# Patient Record
Sex: Male | Born: 1935 | Race: White | Hispanic: No | Marital: Married | Smoking: Never smoker
Health system: Southern US, Community
[De-identification: ages and names within clinical notes are randomized; demographics above are authoritative.]

## PROBLEM LIST (undated history)

## (undated) DIAGNOSIS — K9189 Other postprocedural complications and disorders of digestive system: Secondary | ICD-10-CM

## (undated) DIAGNOSIS — K2270 Barrett's esophagus without dysplasia: Secondary | ICD-10-CM

## (undated) DIAGNOSIS — Z8639 Personal history of other endocrine, nutritional and metabolic disease: Secondary | ICD-10-CM

## (undated) DIAGNOSIS — K567 Ileus, unspecified: Secondary | ICD-10-CM

## (undated) DIAGNOSIS — N189 Chronic kidney disease, unspecified: Secondary | ICD-10-CM

## (undated) DIAGNOSIS — K5790 Diverticulosis of intestine, part unspecified, without perforation or abscess without bleeding: Secondary | ICD-10-CM

## (undated) DIAGNOSIS — E039 Hypothyroidism, unspecified: Secondary | ICD-10-CM

## (undated) DIAGNOSIS — Z8719 Personal history of other diseases of the digestive system: Secondary | ICD-10-CM

## (undated) DIAGNOSIS — K2980 Duodenitis without bleeding: Secondary | ICD-10-CM

## (undated) DIAGNOSIS — I10 Essential (primary) hypertension: Secondary | ICD-10-CM

## (undated) DIAGNOSIS — K449 Diaphragmatic hernia without obstruction or gangrene: Secondary | ICD-10-CM

## (undated) DIAGNOSIS — Z9289 Personal history of other medical treatment: Secondary | ICD-10-CM

## (undated) DIAGNOSIS — I517 Cardiomegaly: Secondary | ICD-10-CM

## (undated) DIAGNOSIS — E291 Testicular hypofunction: Secondary | ICD-10-CM

## (undated) DIAGNOSIS — C259 Malignant neoplasm of pancreas, unspecified: Secondary | ICD-10-CM

## (undated) DIAGNOSIS — Z87442 Personal history of urinary calculi: Secondary | ICD-10-CM

## (undated) DIAGNOSIS — K4020 Bilateral inguinal hernia, without obstruction or gangrene, not specified as recurrent: Secondary | ICD-10-CM

## (undated) DIAGNOSIS — E119 Type 2 diabetes mellitus without complications: Secondary | ICD-10-CM

## (undated) DIAGNOSIS — C4490 Unspecified malignant neoplasm of skin, unspecified: Secondary | ICD-10-CM

## (undated) DIAGNOSIS — K439 Ventral hernia without obstruction or gangrene: Secondary | ICD-10-CM

## (undated) DIAGNOSIS — K429 Umbilical hernia without obstruction or gangrene: Secondary | ICD-10-CM

## (undated) DIAGNOSIS — E785 Hyperlipidemia, unspecified: Secondary | ICD-10-CM

## (undated) DIAGNOSIS — R55 Syncope and collapse: Secondary | ICD-10-CM

## (undated) DIAGNOSIS — K219 Gastro-esophageal reflux disease without esophagitis: Secondary | ICD-10-CM

## (undated) DIAGNOSIS — K8020 Calculus of gallbladder without cholecystitis without obstruction: Secondary | ICD-10-CM

## (undated) HISTORY — DX: Diaphragmatic hernia without obstruction or gangrene: K44.9

## (undated) HISTORY — PX: VASECTOMY: SHX75

## (undated) HISTORY — DX: Type 2 diabetes mellitus without complications: E11.9

## (undated) HISTORY — DX: Duodenitis without bleeding: K29.80

## (undated) HISTORY — PX: COLONOSCOPY: SHX174

## (undated) HISTORY — DX: Diverticulosis of intestine, part unspecified, without perforation or abscess without bleeding: K57.90

## (undated) HISTORY — PX: ESOPHAGOGASTRODUODENOSCOPY: SHX1529

## (undated) HISTORY — DX: Barrett's esophagus without dysplasia: K22.70

## (undated) HISTORY — DX: Gastro-esophageal reflux disease without esophagitis: K21.9

---

## 1999-03-19 ENCOUNTER — Encounter (INDEPENDENT_AMBULATORY_CARE_PROVIDER_SITE_OTHER): Payer: Self-pay

## 1999-03-19 ENCOUNTER — Other Ambulatory Visit: Admission: RE | Admit: 1999-03-19 | Discharge: 1999-03-19 | Payer: Self-pay | Admitting: Gastroenterology

## 2001-02-20 ENCOUNTER — Ambulatory Visit (HOSPITAL_COMMUNITY): Admission: RE | Admit: 2001-02-20 | Discharge: 2001-02-20 | Payer: Self-pay | Admitting: Internal Medicine

## 2001-02-20 ENCOUNTER — Encounter: Payer: Self-pay | Admitting: Internal Medicine

## 2004-01-09 ENCOUNTER — Ambulatory Visit: Payer: Self-pay | Admitting: Pulmonary Disease

## 2004-07-18 ENCOUNTER — Ambulatory Visit: Payer: Self-pay | Admitting: Pulmonary Disease

## 2004-09-26 ENCOUNTER — Ambulatory Visit: Payer: Self-pay | Admitting: Pulmonary Disease

## 2005-11-12 ENCOUNTER — Ambulatory Visit: Payer: Self-pay | Admitting: Pulmonary Disease

## 2005-11-26 ENCOUNTER — Encounter: Admission: RE | Admit: 2005-11-26 | Discharge: 2005-11-26 | Payer: Self-pay | Admitting: Pulmonary Disease

## 2005-12-25 ENCOUNTER — Ambulatory Visit: Payer: Self-pay | Admitting: Pulmonary Disease

## 2006-01-15 ENCOUNTER — Ambulatory Visit: Payer: Self-pay | Admitting: Gastroenterology

## 2006-02-03 ENCOUNTER — Encounter (INDEPENDENT_AMBULATORY_CARE_PROVIDER_SITE_OTHER): Payer: Self-pay | Admitting: *Deleted

## 2006-02-03 ENCOUNTER — Ambulatory Visit: Payer: Self-pay | Admitting: Gastroenterology

## 2007-04-07 ENCOUNTER — Ambulatory Visit: Payer: Self-pay | Admitting: Gastroenterology

## 2008-12-12 ENCOUNTER — Encounter (INDEPENDENT_AMBULATORY_CARE_PROVIDER_SITE_OTHER): Payer: Self-pay | Admitting: *Deleted

## 2009-07-11 ENCOUNTER — Telehealth: Payer: Self-pay | Admitting: Gastroenterology

## 2009-07-12 ENCOUNTER — Encounter (INDEPENDENT_AMBULATORY_CARE_PROVIDER_SITE_OTHER): Payer: Self-pay | Admitting: *Deleted

## 2009-07-31 ENCOUNTER — Encounter (INDEPENDENT_AMBULATORY_CARE_PROVIDER_SITE_OTHER): Payer: Self-pay | Admitting: *Deleted

## 2009-08-02 ENCOUNTER — Ambulatory Visit: Payer: Self-pay | Admitting: Gastroenterology

## 2009-08-16 ENCOUNTER — Ambulatory Visit: Payer: Self-pay | Admitting: Gastroenterology

## 2009-08-18 ENCOUNTER — Encounter: Payer: Self-pay | Admitting: Gastroenterology

## 2010-04-12 NOTE — Letter (Signed)
Summary: Patient Notice-[facility]  [address] [place], [place] [address]   Phone: [phone]  Fax: [phone]        August 18, 2009 MRN: [id number]    [facility] [address] [place], [place]  [address]    Dear Mr. Guderian,  I am pleased to inform you that the biopsies taken during your recent endoscopic examination did not show any evidence of cancer upon pathologic examination.  However, your biopsies indicate you have a condition known as Barrett's esophagus. While not cancer, it is pre-cancerous (can progress to cancer) and needs to be monitored with repeat endoscopic examination and biopsies.  Fortunately, it is quite rare that this develops into cancer, but careful monitoring of the condition along with taking your medication as prescribed is important in reducing the risk of developing cancer.  It is my recommendation that you have a repeat upper gastrointestinal endoscopic examination in 3_ years.  Additional information/recommendations:  __Please call [phone] to schedule a return visit to further      evaluate your condition.  _xx_Continue with treatment plan as outlined the day of your exam.  Please call us if you have or develop heartburn, reflux symptoms, any swallowing problems, or if you have questions about your condition that have not been fully answered at this time.  Sincerely,  Mardella Layman MD [facility]  This letter has been electronically signed by your physician.  Appended Document: Patient Notice-Barrett's Esopghagus letter mailed.

## 2010-04-12 NOTE — Procedures (Signed)
Summary: Colonoscopy  Patient: [name] Note: All result statuses are Final unless otherwise noted.  Tests: (1) Colonoscopy (COL)   COL Colonoscopy           DONE     [facility]     [address]     [place], [place]  [address]           COLONOSCOPY PROCEDURE REPORT           PATIENT:  Zachary Cook, Zachary Cook  MR#:  [id number]     BIRTHDATE:  [birth year], 77 yrs. old  GENDER:  male     ENDOSCOPIST:  Vania Rea. Jarold Motto, MD, [facility]     REF. BY:  Vania Rea. Jarold Motto, M.D.     PROCEDURE DATE:  08/16/2009     PROCEDURE:  Surveillance Colonoscopy     ASA CLASS:  Class II     INDICATIONS:  history of pre-cancerous (adenomatous) colon polyps           MEDICATIONS:   Fentanyl 50 mcg IV, Versed 6 mg IV           DESCRIPTION OF PROCEDURE:   After the risks benefits and     alternatives of the procedure were thoroughly explained, informed     consent was obtained.  Digital rectal exam was performed and     revealed no abnormalities.   The LB CF-H180AL [id number] endoscope     was introduced through the anus and advanced to the cecum, which     was identified by both the appendix and ileocecal valve, without     limitations.  The quality of the prep was excellent, using     MoviPrep.  The instrument was then slowly withdrawn as the colon     was fully examined.     <<PROCEDUREIMAGES>>           FINDINGS:  Severe diverticulosis was found in the sigmoid to     descending colon segments. red thickened haustral folds.  Moderate     diverticulosis was found in the right colon. large mouthed tics.     No polyps or cancers were seen.   Retroflexed views in the rectum     revealed no abnormalities.    The scope was then withdrawn from     the patient and the procedure completed.           COMPLICATIONS:  None     ENDOSCOPIC IMPRESSION:     1) Severe diverticulosis in the sigmoid to descending colon     segments     2) Moderate diverticulosis in the right colon     3) No polyps or cancers  RECOMMENDATIONS:     1) high fiber diet     2) Repeat Colonoscopy in 5 years.     REPEAT EXAM:  No           ______________________________     Vania Rea. Jarold Motto, MD, [facility]           CC:  Elvina Sidle, MD           n.     Rosalie Doctor:   Vania Rea. Patterson at 08/16/2009 09:15 AM           Pokorski, Jonny Ruiz, [id number]  Note: An exclamation mark (!) indicates a result that was not dispersed into the flowsheet. Document Creation Date: 08/16/2009 9:17 AM _______________________________________________________________________  (1) Order result status: Final Collection or observation date-time: 08/16/2009 09:01 Requested date-time:  Receipt  date-time:  Reported date-time:  Referring Physician:   Ordering Physician: Sheryn Bison [phone]) Specimen Source:  Source: Launa Grill Order Number: [id number] Lab site:   Appended Document: Colonoscopy    Clinical Lists Changes  Observations: Added new observation of COLONNXTDUE: 08/2014 (08/16/2009 11:14)

## 2010-04-12 NOTE — Miscellaneous (Signed)
Summary: [facility]  Clinical Lists Changes  Medications: Added new medication of MOVIPREP 100 GM  SOLR (PEG-KCL-NACL-NASULF-NA ASC-C) As per prep instructions. - Signed Rx of MOVIPREP 100 GM  SOLR (PEG-KCL-NACL-NASULF-NA ASC-C) As per prep instructions.;  #1 x 0;  Signed;  Entered by: Ezra Sites RN;  Authorized by: Mardella Layman MD [facility];  Method used: Electronically to [facility]. [phone]*, [address], [place], [place]  [address], Ph: [phone], Fax: [phone] Allergies: Added new allergy or adverse reaction of * GRISEOFALVIN Observations: Added new observation of NKA: F (08/02/2009 7:44)    Prescriptions: MOVIPREP 100 GM  SOLR (PEG-KCL-NACL-NASULF-NA ASC-C) As per prep instructions.  #1 x 0   Entered by:   Ezra Sites RN   Authorized by:   Mardella Layman MD [facility]   Signed by:   Ezra Sites RN on 08/02/2009   Method used:   Electronically to        [facility] [address] [phone]* (retail)       [address] [place], [place]  [address]       Ph: [phone]       Fax: [phone]   RxID:   [id number]

## 2010-04-12 NOTE — Letter (Signed)
Summary: [facility] Instructions  [facility]  [address] [place], [place] [address]   Phone: [phone]  Fax: [phone]       MERL BOMMARITO    [birth year]    MRN: [id number]        Procedure Day Dorna Bloom: Wednesday 08/16/09     Arrival Time:  7:30 a.m.     Procedure Time:  8:30 a.m.     Location of Procedure:                    _x _  [facility] (4th Floor)   PREPARATION FOR COLONOSCOPY WITH MOVIPREP   Starting 5 days prior to your procedure  08/11/09  do not eat nuts, seeds, popcorn, corn, beans, peas,  salads, or any raw vegetables.  Do not take any fiber supplements (e.g. Metamucil, Citrucel, and Benefiber).  THE DAY BEFORE YOUR PROCEDURE         DATE:  08/15/09  DAY: Tuesday  1.  Drink clear liquids the entire day-NO SOLID FOOD  2.  Do not drink anything colored red or purple.  Avoid juices with pulp.  No orange juice.  3.  Drink at least 64 oz. (8 glasses) of fluid/clear liquids during the day to prevent dehydration and help the prep work efficiently.  CLEAR LIQUIDS INCLUDE: Water Jello Ice Popsicles Tea (sugar ok, no milk/cream) Powdered fruit flavored drinks Coffee (sugar ok, no milk/cream) Gatorade Juice: apple, white grape, white cranberry  Lemonade Clear bullion, consomm, broth Carbonated beverages (any kind) Strained chicken noodle soup Hard Candy                             4.  In the morning, mix first dose of MoviPrep solution:    Empty 1 Pouch A and 1 Pouch B into the disposable container    Add lukewarm drinking water to the top line of the container. Mix to dissolve    Refrigerate (mixed solution should be used within 24 hrs)  5.  Begin drinking the prep at 5:00 p.m. The MoviPrep container is divided by 4 marks.   Every 15 minutes drink the solution down to the next mark (approximately 8 oz) until the full liter is complete.   6.  Follow completed prep with 16 oz of clear liquid of your choice (Nothing red or purple).   Continue to drink clear liquids until bedtime.  7.  Before going to bed, mix second dose of MoviPrep solution:    Empty 1 Pouch A and 1 Pouch B into the disposable container    Add lukewarm drinking water to the top line of the container. Mix to dissolve    Refrigerate  THE DAY OF YOUR PROCEDURE      DATE:  08/16/09   DAY:  Wednesday  Beginning at  3:30 a.m. (5 hours before procedure):         1. Every 15 minutes, drink the solution down to the next mark (approx 8 oz) until the full liter is complete.  2. Follow completed prep with 16 oz. of clear liquid of your choice.    3. You may drink clear liquids until  6:30 a.m.  (2 HOURS BEFORE PROCEDURE).   MEDICATION INSTRUCTIONS  Unless otherwise instructed, you should take regular prescription medications with a small sip of water   as early as possible the morning of your procedure.  Diabetic patients - see separate instructions.  OTHER INSTRUCTIONS  You will need a responsible adult at least 75 years of age to accompany you and drive you home.   This person must remain in the waiting room during your procedure.  Wear loose fitting clothing that is easily removed.  Leave jewelry and other valuables at home.  However, you may wish to bring a book to read or  an iPod/MP3 player to listen to music as you wait for your procedure to start.  Remove all body piercing jewelry and leave at home.  Total time from sign-in until discharge is approximately 2-3 hours.  You should go home directly after your procedure and rest.  You can resume normal activities the  day after your procedure.  The day of your procedure you should not:   Drive   Make legal decisions   Operate machinery   Drink alcohol   Return to work  You will receive specific instructions about eating, activities and medications before you leave.    The above instructions have been reviewed and explained to me by   Ezra Sites RN  Aug 02, 2009  8:15 AM    I fully understand and can verbalize these instructions _____________________________ Date _________

## 2010-04-12 NOTE — Letter (Signed)
Summary: Diabetic Instructions  [facility]  [address] [place], [place] [address]   Phone: [phone]  Fax: [phone]    ELMIN WIEDERHOLT 1932/03/24 MRN: [id number]   _x _   ORAL DIABETIC MEDICATION INSTRUCTIONS  The day before your procedure:   Take your diabetic pill as you do normally  The day of your procedure:   Do not take your diabetic pill    We will check your blood sugar levels during the admission process and again in Recovery before discharging you home  ________________________________________________________________________

## 2010-04-12 NOTE — Progress Notes (Signed)
Summary: Schedule ECL   Phone Note Outgoing Call Call back at [facility] Phone [phone]   Call placed by: Harlow Mares CMA Duncan Dull),  Jul 11, 2009 8:51 AM Call placed to: Patient Summary of Call: left a message with pts wife she will have him call back, he needs to schedule a recall ECL Initial call taken by: Harlow Mares CMA Duncan Dull),  Jul 11, 2009 8:52 AM  Follow-up for Phone Call        pt. returned call and sch'd procedure for 08-16-09.

## 2010-04-12 NOTE — Letter (Signed)
Summary: Previsit letter  [facility]  [address] [place], [place] [address]   Phone: [phone]  Fax: [phone]       07/12/2009 MRN: [id number]  [facility] [address] [place], [place]  [address]  Dear Mr. Carrithers,  Welcome to the Gastroenterology Division at [facility].    You are scheduled to see a nurse for your pre-procedure visit on 08-02-09 at 8:00a.m. on the 3rd floor at [facility], [address]  We ask that you try to arrive at our office 15 minutes prior to your appointment time to allow for check-in.  Your nurse visit will consist of discussing your medical and surgical history, your immediate family medical history, and your medications.    Please bring a complete list of all your medications or, if you prefer, bring the medication bottles and we will list them.  We will need to be aware of both prescribed and over the counter drugs.  We will need to know exact dosage information as well.  If you are on blood thinners (Coumadin, Plavix, Aggrenox, Ticlid, etc.) please call our office today/prior to your appointment, as we need to consult with your physician about holding your medication.   Please be prepared to read and sign documents such as consent forms, a financial agreement, and acknowledgement forms.  If necessary, and with your consent, a friend or relative is welcome to sit-in on the nurse visit with you.  Please bring your insurance card so that we may make a copy of it.  If your insurance requires a referral to see a specialist, please bring your referral form from your primary care physician.  No co-pay is required for this nurse visit.     If you cannot keep your appointment, please call [phone] to cancel or reschedule prior to your appointment date.  This allows [place] the opportunity to schedule an appointment for another patient in need of care.    Thank you for choosing [facility] for your medical  needs.  We appreciate the opportunity to care for you.  Please visit [place] at our website  to learn more about our practice.                     Sincerely.                                                                                                                   The Gastroenterology Division

## 2010-04-12 NOTE — Procedures (Signed)
Summary: Upper Endoscopy  Patient: [name] Note: All result statuses are Final unless otherwise noted.  Tests: (1) Upper Endoscopy (EGD)   EGD Upper Endoscopy       DONE     [facility]     [address]     [place], [place]  [address]           ENDOSCOPY PROCEDURE REPORT           PATIENT:  Zachary Cook, Zachary Cook  MR#:  [id number]     BIRTHDATE:  Feb 10, 1933, 77 yrs. old  GENDER:  male           ENDOSCOPIST:  Vania Rea. Jarold Motto, MD, [facility]     Referred by:  Vania Rea. Jarold Motto, M.D.           PROCEDURE DATE:  08/16/2009     PROCEDURE:  EGD with biopsy     ASA CLASS:  Class II     INDICATIONS:  h/o Barrett's Esophagus           MEDICATIONS:   There was residual sedation effect present from     prior procedure.     TOPICAL ANESTHETIC:           DESCRIPTION OF PROCEDURE:   After the risks benefits and     alternatives of the procedure were thoroughly explained, informed     consent was obtained.  The LB GIF-H180 [id number] endoscope was     introduced through the mouth and advanced to the second portion of     the duodenum, without limitations.  The instrument was slowly     withdrawn as the mucosa was fully examined.     <<PROCEDUREIMAGES>>           Barrett's esophagus was found. 6cm. segment long segment of     Barrett's mucosa biopsied.  A hiatal hernia was found. 6-7 cm hh     noted and free reflux.  Duodenitis was found. no ulcerations or     gastritis noted.    Retroflexed views revealed a hiatal hernia.     The scope was then withdrawn from the patient and the procedure     completed.           COMPLICATIONS:  None           ENDOSCOPIC IMPRESSION:     1) Barrett's esophagus     2) Hiatal hernia     3) Duodenitis     4) A hiatal hernia     CHRONIC GERD AND BARRETT'S.R/O DYSPLASIA.     RECOMMENDATIONS:     1) Await biopsy results     2) REPEAT SURVEILLANCE EGD IN 3 YEARS     3) continue current medications           REPEAT EXAM:  No        ______________________________     Vania Rea. Jarold Motto, MD, [facility]           CC:  Elvina Sidle, MD           n.     Rosalie Doctor:   Vania Rea. Janah Mcculloh at 08/16/2009 09:25 AM           Cohenour, Jonny Ruiz, [id number]  Note: An exclamation mark (!) indicates a result that was not dispersed into the flowsheet. Document Creation Date: 08/16/2009 9:26 AM _______________________________________________________________________  (1) Order result status: Final Collection or observation date-time: 08/16/2009 09:11 Requested date-time:  Receipt date-time:  Reported date-time:  Referring Physician:   Ordering Physician: Sheryn Bison [phone]) Specimen Source:  Source: Launa Grill Order Number: [id number] Lab site:   Appended Document: Upper Endoscopy     Procedures Next Due Date:    EGD: 08/2012

## 2010-05-28 LAB — GLUCOSE, CAPILLARY
Glucose-Capillary: 161 mg/dL — ABNORMAL HIGH (ref 70–99)
Glucose-Capillary: 161 mg/dL — ABNORMAL HIGH (ref 70–99)

## 2010-07-24 NOTE — Assessment & Plan Note (Signed)
[facility]                         GASTROENTEROLOGY OFFICE NOTE   NAME:Zachary Cook, Zachary Cook                   MRN:          [id number]  DATE:04/07/2007                            DOB:          02-12-1933    Zachary Cook has had several weeks of pain in his rectum with swelling  peri-rectally without other GI complaints except for some mild  constipation.  He is up to date on his colonoscopy exams which are due  2010.  He does have known Barrett's mucosa, is on chronic Nexium 40 mg a  day.  He also has diabetes and takes glyburide/metformin 5/500 mg a day  and Synthroid 200 mcg a day.   He is a healthy-appearing white male in no acute distress appearing his  stated age.  He is 5 foot 8 and weighs 209 pounds.  Blood pressure was  166/78 and pulse was 74 and regular.  ABDOMINAL:  Unremarkable.  Inspection of the rectum showed a thrombosed hemorrhoid in the anterior  lateral position without obvious fissures or fistulae.   RECOMMENDATIONS:  I have referred him to [facility] for  excision of his thrombosed hemorrhoid.  I have advised him as to sitz  baths, Analpram cream p.r.n. and have placed him on daily fiber  supplement.  He is due for his endoscopy and colonoscopy followups in  November 2010.     Vania Rea. Jarold Motto, MD, [facility], FAGA  Electronically Signed    DRP/MedQ  DD: 04/07/2007  DT: 04/07/2007  Job #: [id number]   cc:   [facility]

## 2010-07-24 NOTE — Assessment & Plan Note (Signed)
[facility]                         GASTROENTEROLOGY OFFICE NOTE   NAME:Zachary Cook, Zachary Cook                   MRN:          [id number]  DATE:04/07/2007                            DOB:          [birth year]    NO DICTATION     Vania Rea. Jarold Motto, MD, [facility], FAGA  Electronically Signed    DRP/MedQ  DD: 04/07/2007  DT: 04/07/2007  Job #: [id number]

## 2010-07-27 NOTE — Assessment & Plan Note (Signed)
[facility]                                   ON-CALL NOTE   NAME:Zachary Cook, Zachary Cook                   MRN:          [id number]  DATE:11/10/2005                            DOB:          [birth year]    PULMONARY ON CALL NOTE:  Notified by the patient's family that he has been  having increased history of thirst, dry mouth, and weight loss over the last  several weeks, and per a friend's Glucometer his blood sugar was significant  elevated over 500.  The patient was recommended that he needed to be seen  immediately and recommended that he go to the emergency room.  The patient's  family member verbalized understanding and said he would relay the message.                                   Rubye Oaks, NP                                Zachary Cloud. Kriste Basque, MD   TP/MedQ  DD:  11/12/2005  DT:  11/12/2005  Job #:  [id number]

## 2011-03-21 ENCOUNTER — Ambulatory Visit (INDEPENDENT_AMBULATORY_CARE_PROVIDER_SITE_OTHER): Payer: 59 | Admitting: Family Medicine

## 2011-03-21 DIAGNOSIS — E236 Other disorders of pituitary gland: Secondary | ICD-10-CM

## 2011-03-21 DIAGNOSIS — IMO0001 Reserved for inherently not codable concepts without codable children: Secondary | ICD-10-CM

## 2011-03-23 ENCOUNTER — Ambulatory Visit (INDEPENDENT_AMBULATORY_CARE_PROVIDER_SITE_OTHER): Payer: 59

## 2011-03-23 DIAGNOSIS — IMO0001 Reserved for inherently not codable concepts without codable children: Secondary | ICD-10-CM

## 2011-03-24 ENCOUNTER — Ambulatory Visit (INDEPENDENT_AMBULATORY_CARE_PROVIDER_SITE_OTHER): Payer: 59

## 2011-03-24 DIAGNOSIS — IMO0001 Reserved for inherently not codable concepts without codable children: Secondary | ICD-10-CM

## 2011-03-25 ENCOUNTER — Ambulatory Visit (INDEPENDENT_AMBULATORY_CARE_PROVIDER_SITE_OTHER): Payer: 59

## 2011-03-25 DIAGNOSIS — IMO0001 Reserved for inherently not codable concepts without codable children: Secondary | ICD-10-CM

## 2011-05-09 ENCOUNTER — Encounter: Payer: Self-pay | Admitting: Family Medicine

## 2011-05-09 ENCOUNTER — Ambulatory Visit (INDEPENDENT_AMBULATORY_CARE_PROVIDER_SITE_OTHER): Payer: Medicare Other | Admitting: Family Medicine

## 2011-05-09 ENCOUNTER — Other Ambulatory Visit: Payer: Self-pay | Admitting: Family Medicine

## 2011-05-09 VITALS — BP 128/74 | HR 61 | Temp 98.4°F | Resp 16 | Ht 64.5 in | Wt 185.0 lb

## 2011-05-09 DIAGNOSIS — I10 Essential (primary) hypertension: Secondary | ICD-10-CM

## 2011-05-09 DIAGNOSIS — G4700 Insomnia, unspecified: Secondary | ICD-10-CM

## 2011-05-09 DIAGNOSIS — E236 Other disorders of pituitary gland: Secondary | ICD-10-CM

## 2011-05-09 DIAGNOSIS — K219 Gastro-esophageal reflux disease without esophagitis: Secondary | ICD-10-CM

## 2011-05-09 DIAGNOSIS — E291 Testicular hypofunction: Secondary | ICD-10-CM

## 2011-05-09 DIAGNOSIS — E119 Type 2 diabetes mellitus without complications: Secondary | ICD-10-CM | POA: Insufficient documentation

## 2011-05-09 MED ORDER — TESTOSTERONE CYPIONATE 200 MG/ML IM SOLN: 100.0000 mg | INTRAMUSCULAR | Status: DC

## 2011-05-09 MED ORDER — TESTOSTERONE CYPIONATE 100 MG/ML IM SOLN: 200.0000 mg | INTRAMUSCULAR | Status: DC

## 2011-05-09 MED ORDER — METFORMIN HCL 1000 MG PO TABS: 1000.0000 mg | ORAL_TABLET | Freq: Two times a day (BID) | ORAL | Status: DC

## 2011-05-09 NOTE — Progress Notes (Signed)
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 He's been running high blood sugars for the past 2 months with sugars running between 202 150 the last month. We tried Januvia to control his sugar but that has not worked. In the last week he's gone back to just plain metformin 500 mg twice a day. He's tried to watch his diet better but he still gaining weight.   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 His sleep schedule has been disrupted and he wants something for sleep as well.  Objective: HEENT unremarkable  Fundi examined no neovascularization or hemorrhages  Chest: Clear  Heart: No murmur gallop or rub  Abdomen: Obese-no HSM, masses, tenderness  Assessment: Insomnia secondary to schedule disruption, diabetes type 2 uncontrolled.  Plan: Ativan 1 mg when necessary at bedtime #3 refills, increase metformin to 1000 twice a day for one month and then recheck been. S. patient call me if blood sugars continue to run over 200 for the next week.

## 2011-05-10 ENCOUNTER — Other Ambulatory Visit: Payer: Self-pay

## 2011-05-10 NOTE — Telephone Encounter (Signed)
Pt came to 104 stating we didn't call his testosterone injection vial to [facility]. Called in RX to [facility]

## 2011-06-13 ENCOUNTER — Ambulatory Visit (INDEPENDENT_AMBULATORY_CARE_PROVIDER_SITE_OTHER): Payer: Medicare Other | Admitting: Family Medicine

## 2011-06-13 ENCOUNTER — Encounter: Payer: Self-pay | Admitting: Family Medicine

## 2011-06-13 VITALS — BP 125/77 | HR 69 | Temp 97.0°F | Resp 16 | Ht 64.5 in | Wt 181.2 lb

## 2011-06-13 DIAGNOSIS — IMO0001 Reserved for inherently not codable concepts without codable children: Secondary | ICD-10-CM

## 2011-06-13 DIAGNOSIS — E291 Testicular hypofunction: Secondary | ICD-10-CM

## 2011-06-13 DIAGNOSIS — E119 Type 2 diabetes mellitus without complications: Secondary | ICD-10-CM

## 2011-06-13 DIAGNOSIS — E236 Other disorders of pituitary gland: Secondary | ICD-10-CM

## 2011-06-13 MED ORDER — TESTOSTERONE CYPIONATE 200 MG/ML IM SOLN: 200.0000 mg | INTRAMUSCULAR | Status: DC

## 2011-06-13 MED ORDER — GLUCOSE BLOOD VI STRP: ORAL_STRIP | Status: DC

## 2011-06-13 MED ORDER — TESTOSTERONE CYPIONATE 200 MG/ML IM SOLN
200.0000 mg | INTRAMUSCULAR | Status: DC
  Administered 2011-06-13: 200 mg via INTRAMUSCULAR

## 2011-06-13 MED ORDER — TESTOSTERONE CYPIONATE 100 MG/ML IM SOLN: 200.0000 mg | INTRAMUSCULAR | Status: DC

## 2011-06-13 NOTE — Progress Notes (Signed)
Addended by: Elvina Sidle on: 06/13/2011 12:26 PM   Modules accepted: Orders

## 2011-06-13 NOTE — Progress Notes (Signed)
Feeling fine. Reviewed blood sugar graph which shows recent spikes on last two weekends. Sleeping better now.  Less nocturia.  O: HEENT unremarkable Chest: clear Heart:  Reg, no murmur Abd:  Soft, no HSM Feet:  No lesions  A:  Better control overall.  The spikes are consistent with Easter eating and weekend splurges, which we discussed.  P:   Continue current plan: Daily monitoring of blood sugars, Glucophage 1000 twice a day, work on weight loss Recheck 3 months.

## 2011-06-13 NOTE — Progress Notes (Signed)
Addended by: Elvina Sidle on: 06/13/2011 11:05 AM   Modules accepted: Orders

## 2011-07-11 ENCOUNTER — Encounter: Payer: Self-pay | Admitting: Family Medicine

## 2011-07-11 ENCOUNTER — Ambulatory Visit (INDEPENDENT_AMBULATORY_CARE_PROVIDER_SITE_OTHER): Payer: Medicare Other | Admitting: Family Medicine

## 2011-07-11 DIAGNOSIS — E291 Testicular hypofunction: Secondary | ICD-10-CM

## 2011-07-11 DIAGNOSIS — E236 Other disorders of pituitary gland: Secondary | ICD-10-CM

## 2011-07-11 DIAGNOSIS — IMO0001 Reserved for inherently not codable concepts without codable children: Secondary | ICD-10-CM

## 2011-07-11 MED ORDER — SITAGLIPTIN PHOS-METFORMIN HCL 50-1000 MG PO TABS: 1.0000 | ORAL_TABLET | Freq: Every day | ORAL | Status: DC

## 2011-07-11 MED ORDER — TESTOSTERONE CYPIONATE 100 MG/ML IM SOLN
200.0000 mg | Freq: Once | INTRAMUSCULAR | Status: AC
  Administered 2011-07-11: 200 mg via INTRAMUSCULAR

## 2011-07-11 NOTE — Progress Notes (Signed)
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

## 2011-08-08 ENCOUNTER — Encounter: Payer: Self-pay | Admitting: Family Medicine

## 2011-08-08 ENCOUNTER — Ambulatory Visit (INDEPENDENT_AMBULATORY_CARE_PROVIDER_SITE_OTHER): Payer: Medicare Other | Admitting: Family Medicine

## 2011-08-08 DIAGNOSIS — E291 Testicular hypofunction: Secondary | ICD-10-CM

## 2011-08-08 DIAGNOSIS — E236 Other disorders of pituitary gland: Secondary | ICD-10-CM

## 2011-08-08 DIAGNOSIS — E119 Type 2 diabetes mellitus without complications: Secondary | ICD-10-CM

## 2011-08-08 MED ORDER — METFORMIN HCL 1000 MG PO TABS: 1000.0000 mg | ORAL_TABLET | Freq: Two times a day (BID) | ORAL | Status: DC

## 2011-08-08 MED ORDER — SITAGLIPTIN PHOS-METFORMIN HCL 50-1000 MG PO TABS: 1.0000 | ORAL_TABLET | Freq: Every day | ORAL | Status: DC

## 2011-08-08 MED ORDER — TESTOSTERONE CYPIONATE 100 MG/ML IM SOLN
200.0000 mg | INTRAMUSCULAR | Status: DC
  Administered 2011-09-05 – 2011-10-03 (×2): 200 mg via INTRAMUSCULAR

## 2011-08-08 NOTE — Progress Notes (Signed)
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  He is active at home, doing yard work. Although he has not lost any weight his graph of blood sugars is remarkable in  the improvement with the extra Zachary Cook daily.  Patient is also here for a testosterone injection. He's had no problems with the injections today.  Objective: Reviewed patient's graft was blood sugars and we talked about the role of exercise and getting the weight down.  Assessment: Markedly improved Zachary Cook with BX for Zachary Cook daily, hypergonadism under control  Plan: I have asked Zachary Cook to come back in 2 months for a A1c and follow up labs as well as review of medications.

## 2011-09-05 ENCOUNTER — Ambulatory Visit (INDEPENDENT_AMBULATORY_CARE_PROVIDER_SITE_OTHER): Payer: Medicare Other | Admitting: Family Medicine

## 2011-09-05 DIAGNOSIS — E291 Testicular hypofunction: Secondary | ICD-10-CM

## 2011-09-05 DIAGNOSIS — E236 Other disorders of pituitary gland: Secondary | ICD-10-CM

## 2011-09-05 MED ORDER — CYANOCOBALAMIN 1000 MCG/ML IJ SOLN: 1000.0000 ug | INTRAMUSCULAR | Status: DC

## 2011-09-05 NOTE — Progress Notes (Signed)
Here for b12 shot only 

## 2011-10-03 ENCOUNTER — Other Ambulatory Visit: Payer: Self-pay | Admitting: Family Medicine

## 2011-10-03 ENCOUNTER — Ambulatory Visit (INDEPENDENT_AMBULATORY_CARE_PROVIDER_SITE_OTHER): Payer: Medicare Other

## 2011-10-03 DIAGNOSIS — E291 Testicular hypofunction: Secondary | ICD-10-CM

## 2011-10-03 DIAGNOSIS — E236 Other disorders of pituitary gland: Secondary | ICD-10-CM

## 2011-10-03 MED ORDER — TESTOSTERONE CYPIONATE 200 MG/ML IM SOLN: 200.0000 mg | INTRAMUSCULAR | Status: DC

## 2011-10-31 ENCOUNTER — Ambulatory Visit (INDEPENDENT_AMBULATORY_CARE_PROVIDER_SITE_OTHER): Payer: Medicare Other | Admitting: Family Medicine

## 2011-10-31 DIAGNOSIS — E236 Other disorders of pituitary gland: Secondary | ICD-10-CM

## 2011-10-31 DIAGNOSIS — E291 Testicular hypofunction: Secondary | ICD-10-CM

## 2011-10-31 MED ORDER — TESTOSTERONE CYPIONATE 100 MG/ML IM SOLN
200.0000 mg | INTRAMUSCULAR | Status: DC
  Administered 2011-10-31: 200 mg via INTRAMUSCULAR

## 2011-11-28 ENCOUNTER — Ambulatory Visit (INDEPENDENT_AMBULATORY_CARE_PROVIDER_SITE_OTHER): Payer: Medicare Other

## 2011-11-28 DIAGNOSIS — R7989 Other specified abnormal findings of blood chemistry: Secondary | ICD-10-CM

## 2011-11-28 DIAGNOSIS — E291 Testicular hypofunction: Secondary | ICD-10-CM

## 2011-11-28 MED ORDER — TESTOSTERONE CYPIONATE 200 MG/ML IM SOLN
200.0000 mg | Freq: Once | INTRAMUSCULAR | Status: AC
  Administered 2011-11-28: 200 mg via INTRAMUSCULAR

## 2011-11-28 NOTE — Progress Notes (Signed)
  Subjective:    Patient ID: Zachary Cook, male    DOB: 11/09/1932, 78 y.o.   MRN: [id number]  HPI Pt here for injection only.   Review of Systems     Objective:   Physical Exam        Assessment & Plan:

## 2011-12-17 ENCOUNTER — Other Ambulatory Visit: Payer: Self-pay | Admitting: Family Medicine

## 2011-12-26 ENCOUNTER — Ambulatory Visit (INDEPENDENT_AMBULATORY_CARE_PROVIDER_SITE_OTHER): Payer: Medicare Other

## 2011-12-26 ENCOUNTER — Other Ambulatory Visit: Payer: Self-pay | Admitting: Family Medicine

## 2011-12-26 DIAGNOSIS — R7989 Other specified abnormal findings of blood chemistry: Secondary | ICD-10-CM

## 2011-12-26 DIAGNOSIS — E291 Testicular hypofunction: Secondary | ICD-10-CM

## 2011-12-26 MED ORDER — TESTOSTERONE CYPIONATE 200 MG/ML IM SOLN: 200.0000 mg | INTRAMUSCULAR | Status: DC

## 2011-12-26 MED ORDER — TESTOSTERONE CYPIONATE 200 MG/ML IM SOLN
200.0000 mg | INTRAMUSCULAR | Status: DC
  Administered 2011-12-26: 200 mg via INTRAMUSCULAR

## 2011-12-26 NOTE — Progress Notes (Signed)
  Subjective:    Patient ID: Zachary Cook, male    DOB: 16-Jun-1932, 78 y.o.   MRN: [id number]  HPI    Review of Systems     Objective:   Physical Exam        Assessment & Plan:  Patient here for testosterone injection only.

## 2012-01-17 ENCOUNTER — Other Ambulatory Visit: Payer: Self-pay | Admitting: Physician Assistant

## 2012-01-23 ENCOUNTER — Ambulatory Visit (INDEPENDENT_AMBULATORY_CARE_PROVIDER_SITE_OTHER): Payer: Medicare Other | Admitting: *Deleted

## 2012-01-23 DIAGNOSIS — E291 Testicular hypofunction: Secondary | ICD-10-CM

## 2012-01-23 MED ORDER — TESTOSTERONE CYPIONATE 200 MG/ML IM SOLN
200.0000 mg | INTRAMUSCULAR | Status: DC
  Administered 2012-01-23: 200 mg via INTRAMUSCULAR

## 2012-02-14 ENCOUNTER — Other Ambulatory Visit: Payer: Self-pay | Admitting: Physician Assistant

## 2012-02-20 ENCOUNTER — Ambulatory Visit (INDEPENDENT_AMBULATORY_CARE_PROVIDER_SITE_OTHER): Payer: Medicare Other | Admitting: Family Medicine

## 2012-02-20 DIAGNOSIS — R7989 Other specified abnormal findings of blood chemistry: Secondary | ICD-10-CM

## 2012-02-20 DIAGNOSIS — E291 Testicular hypofunction: Secondary | ICD-10-CM

## 2012-02-20 NOTE — Progress Notes (Signed)
  Subjective:    Patient ID: Zachary Cook, male    DOB: October 08, 1932, 77 y.o.   MRN: [id number]  HPI    Review of Systems     Objective:   Physical Exam        Assessment & Plan:  Pt presented for testosterone injection today. The bottle had been opened since Aug. 22, 2013. Per pharmacy, this injection is invalid after 28 days. Injection was not given. Explained this to pt. Pt understood. Pt will have his pharmacy call for another refill.

## 2012-03-16 ENCOUNTER — Other Ambulatory Visit: Payer: Self-pay | Admitting: Family Medicine

## 2012-03-17 ENCOUNTER — Other Ambulatory Visit: Payer: Self-pay | Admitting: *Deleted

## 2012-03-17 MED ORDER — SITAGLIPTIN PHOS-METFORMIN HCL 50-1000 MG PO TABS: 1.0000 | ORAL_TABLET | Freq: Every day | ORAL | Status: DC

## 2012-04-09 ENCOUNTER — Ambulatory Visit (INDEPENDENT_AMBULATORY_CARE_PROVIDER_SITE_OTHER): Payer: Medicare Other | Admitting: Family Medicine

## 2012-04-09 ENCOUNTER — Encounter: Payer: Self-pay | Admitting: Family Medicine

## 2012-04-09 VITALS — BP 128/76 | HR 83 | Temp 98.0°F | Resp 18 | Ht 65.0 in | Wt 176.0 lb

## 2012-04-09 DIAGNOSIS — E119 Type 2 diabetes mellitus without complications: Secondary | ICD-10-CM

## 2012-04-09 DIAGNOSIS — E291 Testicular hypofunction: Secondary | ICD-10-CM

## 2012-04-09 DIAGNOSIS — E236 Other disorders of pituitary gland: Secondary | ICD-10-CM

## 2012-04-09 MED ORDER — SITAGLIPTIN PHOS-METFORMIN HCL 50-1000 MG PO TABS: 1.0000 | ORAL_TABLET | Freq: Every day | ORAL | Status: DC

## 2012-04-09 NOTE — Progress Notes (Signed)
79 yo type 2 diabetic with hypogonadism, retired [employer], who is struggling with his elevated sugars for the last month since he ran out of his Janumet.  Also, he has been noticing increasing fatigue and was told that the vials of depotestosterone are no longer keeping their potency.  I called the pharmacy with patient in the room.  The pharmacist said that the testoserone that they carry isgood for the full 10 months.  I reviewed the elevated BS's recently (230->400) and saw that the Janumet had run out 10 days ago.  Patient has lost  More weight and is exercising regularly and following his diet.  No recent A1C or urine microalbumin.  No   Assessment:  Decreased potency of depo in the vial and uncontrolled diabetes type II.    Plan:  Refill depotestosterone and if patient is not seeing increased energy, we can increase the dosage (after checking testosterone and TSH levels) Refill the Janumet 50/1000 qd and patient will continue following the BS.  If BS continues >200 for another 7-10 days, will add glipizide 5 mg for 10 days.

## 2012-04-20 ENCOUNTER — Ambulatory Visit (INDEPENDENT_AMBULATORY_CARE_PROVIDER_SITE_OTHER): Payer: Medicare Other | Admitting: Family Medicine

## 2012-04-20 DIAGNOSIS — E291 Testicular hypofunction: Secondary | ICD-10-CM

## 2012-04-20 DIAGNOSIS — E349 Endocrine disorder, unspecified: Secondary | ICD-10-CM

## 2012-04-20 MED ORDER — TESTOSTERONE CYPIONATE 100 MG/ML IM SOLN
200.0000 mg | Freq: Once | INTRAMUSCULAR | Status: AC
  Administered 2012-04-20: 200 mg via INTRAMUSCULAR

## 2012-04-25 ENCOUNTER — Other Ambulatory Visit: Payer: Self-pay | Admitting: Physician Assistant

## 2012-04-25 ENCOUNTER — Other Ambulatory Visit: Payer: Self-pay | Admitting: Family Medicine

## 2012-04-28 MED ORDER — LEVOTHYROXINE SODIUM 200 MCG PO TABS: ORAL_TABLET | ORAL | Status: DC

## 2012-04-28 NOTE — Addendum Note (Signed)
Addended by: Anders Simmonds on: 04/28/2012 01:36 PM   Modules accepted: Orders

## 2012-05-20 ENCOUNTER — Ambulatory Visit: Payer: Medicare Other | Admitting: Family Medicine

## 2012-05-25 ENCOUNTER — Other Ambulatory Visit: Payer: Self-pay | Admitting: Family Medicine

## 2012-05-25 DIAGNOSIS — E1159 Type 2 diabetes mellitus with other circulatory complications: Secondary | ICD-10-CM

## 2012-05-25 MED ORDER — GLIPIZIDE ER 5 MG PO TB24: 5.0000 mg | ORAL_TABLET | Freq: Every day | ORAL | Status: DC

## 2012-05-25 MED ORDER — METFORMIN HCL 1000 MG PO TABS: 1000.0000 mg | ORAL_TABLET | Freq: Two times a day (BID) | ORAL | Status: DC

## 2012-08-28 ENCOUNTER — Other Ambulatory Visit: Payer: Self-pay | Admitting: Physician Assistant

## 2012-08-28 NOTE — Telephone Encounter (Signed)
Needs OV, labs 

## 2012-09-28 ENCOUNTER — Other Ambulatory Visit: Payer: Self-pay | Admitting: Physician Assistant

## 2012-09-28 ENCOUNTER — Other Ambulatory Visit: Payer: Self-pay | Admitting: Family Medicine

## 2012-09-30 NOTE — Telephone Encounter (Signed)
Needs office visit.

## 2012-10-18 ENCOUNTER — Ambulatory Visit (INDEPENDENT_AMBULATORY_CARE_PROVIDER_SITE_OTHER): Payer: Medicare Other | Admitting: Family Medicine

## 2012-10-18 VITALS — BP 134/66 | HR 71 | Temp 97.9°F | Resp 16 | Ht 64.75 in | Wt 181.8 lb

## 2012-10-18 DIAGNOSIS — E119 Type 2 diabetes mellitus without complications: Secondary | ICD-10-CM

## 2012-10-18 DIAGNOSIS — E039 Hypothyroidism, unspecified: Secondary | ICD-10-CM

## 2012-10-18 DIAGNOSIS — E1159 Type 2 diabetes mellitus with other circulatory complications: Secondary | ICD-10-CM

## 2012-10-18 DIAGNOSIS — I10 Essential (primary) hypertension: Secondary | ICD-10-CM

## 2012-10-18 DIAGNOSIS — K219 Gastro-esophageal reflux disease without esophagitis: Secondary | ICD-10-CM

## 2012-10-18 DIAGNOSIS — N400 Enlarged prostate without lower urinary tract symptoms: Secondary | ICD-10-CM

## 2012-10-18 LAB — POCT CBC
Granulocyte percent: 65.2 %G (ref 37–80)
HCT, POC: 43.4 % — AB (ref 43.5–53.7)
Hemoglobin: 14 g/dL — AB (ref 14.1–18.1)
Lymph, poc: 2.8 (ref 0.6–3.4)
MCH, POC: 29.1 pg (ref 27–31.2)
MCHC: 32.3 g/dL (ref 31.8–35.4)
MCV: 90.2 fL (ref 80–97)
MID (cbc): 0.5 (ref 0–0.9)
MPV: 7.9 fL (ref 0–99.8)
POC Granulocyte: 6.1 (ref 2–6.9)
POC LYMPH PERCENT: 29.6 %L (ref 10–50)
POC MID %: 5.2 %M (ref 0–12)
Platelet Count, POC: 236 10*3/uL (ref 142–424)
RBC: 4.81 M/uL (ref 4.69–6.13)
RDW, POC: 13.9 %
WBC: 9.3 10*3/uL (ref 4.6–10.2)

## 2012-10-18 LAB — COMPREHENSIVE METABOLIC PANEL
ALT: 17 U/L (ref 0–53)
AST: 17 U/L (ref 0–37)
Albumin: 4.4 g/dL (ref 3.5–5.2)
Alkaline Phosphatase: 56 U/L (ref 39–117)
BUN: 22 mg/dL (ref 6–23)
CO2: 26 mEq/L (ref 19–32)
Calcium: 10 mg/dL (ref 8.4–10.5)
Chloride: 107 mEq/L (ref 96–112)
Creat: 1.17 mg/dL (ref 0.50–1.35)
Glucose, Bld: 184 mg/dL — ABNORMAL HIGH (ref 70–99)
Potassium: 5.1 mEq/L (ref 3.5–5.3)
Sodium: 140 mEq/L (ref 135–145)
Total Bilirubin: 0.6 mg/dL (ref 0.3–1.2)
Total Protein: 7.2 g/dL (ref 6.0–8.3)

## 2012-10-18 LAB — MICROALBUMIN, URINE: Microalb, Ur: 3.02 mg/dL — ABNORMAL HIGH (ref 0.00–1.89)

## 2012-10-18 LAB — PSA: PSA: 2.04 ng/mL (ref <=4.00)

## 2012-10-18 LAB — LIPID PANEL
Cholesterol: 157 mg/dL (ref 0–200)
HDL: 44 mg/dL (ref >39)
LDL Cholesterol: 97 mg/dL (ref 0–99)
Total CHOL/HDL Ratio: 3.6 Ratio
Triglycerides: 78 mg/dL (ref <150)
VLDL: 16 mg/dL (ref 0–40)

## 2012-10-18 LAB — TSH: TSH: 0.011 u[IU]/mL — ABNORMAL LOW (ref 0.350–4.500)

## 2012-10-18 LAB — POCT GLYCOSYLATED HEMOGLOBIN (HGB A1C): Hemoglobin A1C: 7.3

## 2012-10-18 MED ORDER — TAMSULOSIN HCL 0.4 MG PO CAPS: 0.4000 mg | ORAL_CAPSULE | Freq: Every day | ORAL | Status: DC

## 2012-10-18 MED ORDER — LEVOTHYROXINE SODIUM 200 MCG PO TABS: 200.0000 ug | ORAL_TABLET | Freq: Every day | ORAL | Status: DC

## 2012-10-18 MED ORDER — METFORMIN HCL 1000 MG PO TABS: 1000.0000 mg | ORAL_TABLET | Freq: Two times a day (BID) | ORAL | Status: DC

## 2012-10-18 MED ORDER — SIMVASTATIN 20 MG PO TABS: 20.0000 mg | ORAL_TABLET | Freq: Every day | ORAL | Status: DC

## 2012-10-18 MED ORDER — GLIPIZIDE ER 5 MG PO TB24: 5.0000 mg | ORAL_TABLET | Freq: Every day | ORAL | Status: DC

## 2012-10-18 MED ORDER — LISINOPRIL 10 MG PO TABS: 10.0000 mg | ORAL_TABLET | Freq: Every day | ORAL | Status: DC

## 2012-10-18 MED ORDER — ESOMEPRAZOLE MAGNESIUM 40 MG PO CPDR: 40.0000 mg | DELAYED_RELEASE_CAPSULE | Freq: Every day | ORAL | Status: DC

## 2012-10-18 MED ORDER — AMLODIPINE BESYLATE 5 MG PO TABS: 5.0000 mg | ORAL_TABLET | Freq: Every day | ORAL | Status: DC

## 2012-10-18 NOTE — Progress Notes (Signed)
Patient ID: Zachary Cook MRN: [id number], DOB: [birth year], 79 y.o. Date of Encounter: 10/18/2012, 2:30 PM  Primary Physician: Elvina Sidle, MD  Chief Complaint: Diabetes follow up  HPI: 79 y.o. year old male with history below presents for follow up of diabetes mellitus. Doing well. No issues or complaints. Taking medications daily without adverse effects. No polydipsia, polyphagia, polyuria, or nocturia.  Blood sugars at home: Diet consists of: Exercising regularly. Last A1C:   Eye MD: DDS: Influenza vaccine: Pneumococcal vaccine: Last CPE:  Past Medical History  Diagnosis Date  . Diabetes mellitus without complication      Home Meds: Prior to Admission medications   Medication Sig Start Date End Date Taking? Authorizing Provider  amLODipine (NORVASC) 5 MG tablet Take 1 tablet (5 mg total) by mouth daily. 10/18/12  Yes Elvina Sidle, MD  aspirin 81 MG tablet Take 81 mg by mouth daily.   Yes Historical Provider, MD  esomeprazole (NEXIUM) 40 MG capsule Take 1 capsule (40 mg total) by mouth daily before breakfast. 10/18/12  Yes Elvina Sidle, MD  glipiZIDE (GLUCOTROL XL) 5 MG 24 hr tablet Take 1 tablet (5 mg total) by mouth daily. PATIENT NEEDS OFFICE VISIT FOR ADDITIONAL REFILLS 10/18/12  Yes Elvina Sidle, MD  levothyroxine (SYNTHROID, LEVOTHROID) 200 MCG tablet Take 1 tablet (200 mcg total) by mouth daily before breakfast. Need office visit & fasting labs for additional refills. 10/18/12  Yes Elvina Sidle, MD  lisinopril (PRINIVIL,ZESTRIL) 10 MG tablet Take 1 tablet (10 mg total) by mouth daily. Need office visit for further refill of all medications 10/18/12  Yes Elvina Sidle, MD  metFORMIN (GLUCOPHAGE) 1000 MG tablet Take 1 tablet (1,000 mg total) by mouth 2 (two) times daily with a meal. 10/18/12 10/18/13 Yes Elvina Sidle, MD  simvastatin (ZOCOR) 20 MG tablet Take 1 tablet (20 mg total) by mouth at bedtime. 10/18/12  Yes Elvina Sidle, MD  tamsulosin  (FLOMAX) 0.4 MG CAPS capsule Take 1 capsule (0.4 mg total) by mouth daily. 10/18/12  Yes Elvina Sidle, MD    Allergies: No Known Allergies  History   Social History  . Marital Status: Married    Spouse Name: N/A    Number of Children: N/A  . Years of Education: N/A   Occupational History  . Not on file.   Social History Main Topics  . Smoking status: Never Smoker   . Smokeless tobacco: Not on file  . Alcohol Use: No  . Drug Use: No  . Sexually Active: Not on file   Other Topics Concern  . Not on file   Social History Narrative  . No narrative on file     Review of Systems: Constitutional: negative for chills, fever, night sweats, weight changes, or fatigue  HEENT: negative for vision changes, hearing loss, congestion, rhinorrhea, or epistaxis Cardiovascular: negative for chest pain, palpitations, diaphoresis, DOE, orthopnea, or edema Respiratory: negative for hemoptysis, wheezing, shortness of breath, dyspnea, or cough Abdominal: negative for abdominal pain, nausea, vomiting, diarrhea, or constipation Dermatological: negative for rash, erythema, or wounds Neurologic: negative for headache, dizziness, or syncope Renal:  Negative for polyuria, polydipsia, or dysuria All other systems reviewed and are otherwise negative with the exception to those above and in the HPI.   Physical Exam: Blood pressure 134/66, pulse 71, temperature 97.9 F (36.6 C), temperature source Oral, resp. rate 16, height 5' 4.75" (1.645 m), weight 181 lb 12.8 oz (82.464 kg), SpO2 95.00%., Body mass index is 30.47 kg/(m^2). General: Well developed, well nourished,  in no acute distress. Head: Normocephalic, atraumatic, eyes without discharge, sclera non-icteric, nares are without discharge. Bilateral auditory canals clear, TM's are without perforation, pearly grey and translucent with reflective cone of light bilaterally. Oral cavity moist, posterior pharynx without exudate, erythema, peritonsillar  abscess, or post nasal drip.  Neck: Supple. No thyromegaly. Full ROM. No lymphadenopathy. Lungs: Clear bilaterally to auscultation without wheezes, rales, or rhonchi. Breathing is unlabored. Heart: RRR with S1 S2. No murmurs, rubs, or gallops appreciated. Abdomen: Soft, non-tender, non-distended with normoactive bowel sounds. No hepatosplenomegaly. No rebound/guarding. No obvious abdominal masses. Msk:  Strength and tone normal for age. Extremities/Skin: Warm and dry. No clubbing or cyanosis. No edema. No rashes, wounds, or suspicious lesions. Monofilament exam  normal.  Neuro: Alert and oriented X 3. Moves all extremities spontaneously. Gait is normal. CNII-XII grossly in tact. Psych:  Responds to questions appropriately with a normal affect.    ASSESSMENT AND PLAN:  79 y.o. year old male with diabetes, GERD, hypertension, hypothyroidism, hyperlipidemia DM (diabetes mellitus) - Plan: POCT glycosylated hemoglobin (Hb A1C), Comprehensive metabolic panel, Lipid panel, TSH, Microalbumin, urine, PSA, simvastatin (ZOCOR) 20 MG tablet, lisinopril (PRINIVIL,ZESTRIL) 10 MG tablet, glipiZIDE (GLUCOTROL XL) 5 MG 24 hr tablet, POCT CBC  Type II or unspecified type diabetes mellitus with peripheral circulatory disorders, uncontrolled(250.72) - Plan: metFORMIN (GLUCOPHAGE) 1000 MG tablet  BPH (benign prostatic hyperplasia) - Plan: tamsulosin (FLOMAX) 0.4 MG CAPS capsule  Hypertension - Plan: lisinopril (PRINIVIL,ZESTRIL) 10 MG tablet, amLODipine (NORVASC) 5 MG tablet, POCT CBC  Unspecified hypothyroidism - Plan: levothyroxine (SYNTHROID, LEVOTHROID) 200 MCG tablet  GERD (gastroesophageal reflux disease) - Plan: esomeprazole (NEXIUM) 40 MG capsule   -  Signed, Elvina Sidle, MD 10/18/2012 2:30 PM

## 2012-10-18 NOTE — Patient Instructions (Addendum)

## 2012-10-29 ENCOUNTER — Other Ambulatory Visit: Payer: Self-pay | Admitting: Family Medicine

## 2013-11-03 ENCOUNTER — Other Ambulatory Visit: Payer: Self-pay | Admitting: Family Medicine

## 2013-11-03 NOTE — Telephone Encounter (Signed)
Patient made an appointment for a complete physical with Dr. Joseph Art on Sept. 24. Patient stated pharmacist at [facility] stated all of his medications need a refill and authorization. Pharmacy will be in contact soon.

## 2013-11-10 ENCOUNTER — Telehealth: Payer: Self-pay | Admitting: Radiology

## 2013-11-10 NOTE — Telephone Encounter (Signed)
Message copied by Candice Camp on Wed Nov 10, 2013  1:56 PM ------      Message from: Lowry Ram      Created: Tue Nov 09, 2013  3:25 PM      Regarding: HQPAF       Zachary Cook has an upcoming OV scheduled for 12/02/13 @ 1000.  I am needing the following to close his PAF.            EARLY DETECTION           Cognitive Function           Depression           PAD           Physical Activity           Risk of Fall           Urinary Incontinence            ONGOING AX & EVAL           Associating DX for Glipizide XL TER 5mg  PO           Other Significant Endocrine & Metabolic D/o           DM with Chronic Complications           HTN           Thyroid D/O's                  Sincerely,      Lowry Ram, RN ------

## 2013-11-10 NOTE — Telephone Encounter (Signed)
Message copied by Candice Camp on Wed Nov 10, 2013  1:58 PM ------      Message from: Lowry Ram      Created: Tue Nov 09, 2013  3:25 PM      Regarding: HQPAF       Zachary Cook has an upcoming OV scheduled for 12/02/13 @ 1000.  I am needing the following to close his PAF.            EARLY DETECTION           Cognitive Function           Depression           PAD           Physical Activity           Risk of Fall           Urinary Incontinence            ONGOING AX & EVAL           Associating DX for Glipizide XL TER 5mg  PO           Other Significant Endocrine & Metabolic D/o           DM with Chronic Complications           HTN           Thyroid D/O's                  Sincerely,      Lowry Ram, RN ------

## 2013-11-10 NOTE — Telephone Encounter (Signed)
Noted, thanks. Will do. Added to appointment information so this will not get missed.

## 2013-11-10 NOTE — Telephone Encounter (Signed)
Noted,I will add to appt notes so Dr L is aware.

## 2013-11-23 ENCOUNTER — Encounter: Payer: Self-pay | Admitting: Gastroenterology

## 2013-12-02 ENCOUNTER — Encounter: Payer: Self-pay | Admitting: Family Medicine

## 2013-12-02 ENCOUNTER — Ambulatory Visit (INDEPENDENT_AMBULATORY_CARE_PROVIDER_SITE_OTHER): Payer: Medicare Other | Admitting: Family Medicine

## 2013-12-02 VITALS — BP 127/70 | HR 84 | Temp 97.7°F | Resp 16 | Ht 65.0 in | Wt 179.8 lb

## 2013-12-02 DIAGNOSIS — I10 Essential (primary) hypertension: Secondary | ICD-10-CM

## 2013-12-02 DIAGNOSIS — N400 Enlarged prostate without lower urinary tract symptoms: Secondary | ICD-10-CM

## 2013-12-02 DIAGNOSIS — E785 Hyperlipidemia, unspecified: Secondary | ICD-10-CM

## 2013-12-02 DIAGNOSIS — E119 Type 2 diabetes mellitus without complications: Secondary | ICD-10-CM

## 2013-12-02 DIAGNOSIS — Z0000 Encounter for general adult medical examination without abnormal findings: Secondary | ICD-10-CM

## 2013-12-02 DIAGNOSIS — E039 Hypothyroidism, unspecified: Secondary | ICD-10-CM

## 2013-12-02 DIAGNOSIS — E291 Testicular hypofunction: Secondary | ICD-10-CM

## 2013-12-02 DIAGNOSIS — Z23 Encounter for immunization: Secondary | ICD-10-CM

## 2013-12-02 LAB — POCT GLYCOSYLATED HEMOGLOBIN (HGB A1C): Hemoglobin A1C: 12.6

## 2013-12-02 LAB — GLUCOSE, POCT (MANUAL RESULT ENTRY): POC Glucose: 343 mg/dl — AB (ref 70–99)

## 2013-12-02 LAB — MICROALBUMIN, URINE: Microalb, Ur: 4.3 mg/dL — ABNORMAL HIGH (ref <2.0)

## 2013-12-02 LAB — IFOBT (OCCULT BLOOD): IFOBT: NEGATIVE

## 2013-12-02 MED ORDER — TAMSULOSIN HCL 0.4 MG PO CAPS: ORAL_CAPSULE | ORAL | Status: DC

## 2013-12-02 MED ORDER — AMLODIPINE BESYLATE 5 MG PO TABS: ORAL_TABLET | ORAL | Status: DC

## 2013-12-02 MED ORDER — TESTOSTERONE CYPIONATE 100 MG/ML IM SOLN
200.0000 mg | INTRAMUSCULAR | Status: AC
  Administered 2013-12-09 – 2014-03-28 (×5): 200 mg via INTRAMUSCULAR

## 2013-12-02 MED ORDER — SIMVASTATIN 20 MG PO TABS: ORAL_TABLET | ORAL | Status: DC

## 2013-12-02 MED ORDER — LEVOTHYROXINE SODIUM 200 MCG PO TABS: ORAL_TABLET | ORAL | Status: DC

## 2013-12-02 MED ORDER — TESTOSTERONE CYPIONATE 200 MG/ML IM SOLN: 200.0000 mg | INTRAMUSCULAR | Status: DC

## 2013-12-02 MED ORDER — LISINOPRIL 10 MG PO TABS: ORAL_TABLET | ORAL | Status: DC

## 2013-12-02 NOTE — Progress Notes (Addendum)
This chart was scribed for Robyn Haber, MD by Starleen Arms, Medical Scribe. This patient was seen in Rm 25 and the patient's care was started at 10:43 AM.  Patient ID: Zachary Cook MRN: [id number], DOB: 27-Dec-1932, 80 y.o. Date of Encounter: 12/02/2013, 8:16 AM  Primary Physician: Robyn Haber, MD  No chief complaint on file.  Prostate is smooth, enlarged, normal for age.    HPI: 80 y.o. year old male with history below presents needing a [facility] Medicare Screening.    EARLY DETECTION  Cognitive Function - Patient denies any changes in cognitive level.  Patient reports he intermittently has to testify in court relating to his previous employment, and is able to do so without difficulty  Depression - Patient denies any history of depression  PAD - no leg pain with walking.  Patient reports normal sensation in his extremities.  Physical Activity - Patient reports he becomes fatigued after several minutes of exertion due to lower testosterone.  Risk of Fall - Patient states his balance is "not as good as it used to be."    Urinary Incontinence - Patient denies any problems but states that "when he has to go, he has to go".  ONGOING AX & EVAL  Association dx for Glipizide XL TER 5 mg PO - Type II Diabetees  Other Significant Endocrine and Metabolic D/o - Hypogonadism  DM with Chronic Complications - Patient will be seen at [facility] to have his eyes checked next week.   HTN - no CP, SOB, or swelling.  Thyroid D/O's - TSH being checked today  Patient reports he is prescribed Metformin and is compliant.  He states he is concerned that the Metformin is not working because he has measured his blood sugar in the last several days at 506, 230, 490, and 320.  He reports his Accu-Chek blood sugar meter recently broke.  Patient reports that he has intentionally lost approximately 30 lbs over an extended time frame.  He reports wanting to lose 15 more lbs to reach his 160 lbs goal.     Patient reports he becomes quickly fatigued after several minutes of exertion.  Patient reports a history of low testosterone and stopped using the cream he was prescribed due to a recall.  Patient states he uses a hearing add for low volume noises.  Patient reports his most recent colonoscopy was 6-7 years ago.   Patient denies receiving the flu vaccine this season.  Patient denies CP, SOB.   Patient reports that he likes to do word-working, such as making cabinets. Past Medical History  Diagnosis Date   Diabetes mellitus without complication      Home Meds: Prior to Admission medications   Medication Sig Start Date End Date Taking? Authorizing Provider  amLODipine (NORVASC) 5 MG tablet TAKE 1 TABLET BY MOUTH DAILY 11/04/13   Mancel Bale, PA-C  aspirin 81 MG tablet Take 81 mg by mouth daily.    Historical Provider, MD  esomeprazole (NEXIUM) 40 MG capsule TAKE 1 CAPSULE BY MOUTH EVERY DAY 11/04/13   Mancel Bale, PA-C  glipiZIDE (GLUCOTROL XL) 5 MG 24 hr tablet TAKE 1 TABLET BY MOUTH DAILY 11/04/13   Mancel Bale, PA-C  levothyroxine (SYNTHROID, LEVOTHROID) 200 MCG tablet TAKE 1 TABLET BY MOUTH DAILY BEFORE BREAKFAST 11/04/13   Mancel Bale, PA-C  lisinopril (PRINIVIL,ZESTRIL) 10 MG tablet TAKE 1 TABLET BY MOUTH DAILY 11/04/13   Mancel Bale, PA-C  simvastatin (ZOCOR) 20 MG tablet TAKE 1  TABLET BY MOUTH AT BEDTIME 11/04/13   Mancel Bale, PA-C  tamsulosin (FLOMAX) 0.4 MG CAPS capsule TAKE ONE CAPSULE BY MOUTH DAILY 11/04/13   Mancel Bale, PA-C  TRUETRACK TEST test strip USE AS DIRECTED 10/29/12   Collene Leyden, PA-C    Allergies: No Known Allergies  History   Social History   Marital Status: Married    Spouse Name: N/A    Number of Children: N/A   Years of Education: N/A   Occupational History   Not on file.   Social History Main Topics   Smoking status: Never Smoker    Smokeless tobacco: Not on file   Alcohol Use: No   Drug Use: No   Sexual Activity: Not on  file   Other Topics Concern   Not on file   Social History Narrative   No narrative on file     Review of Systems: Constitutional: negative for chills, fever, night sweats, + weight changes, or + fatigue  HEENT: negative for vision changes, hearing loss, congestion, rhinorrhea, ST, epistaxis, or sinus pressure Cardiovascular: negative for chest pain or palpitations Respiratory: negative for hemoptysis, wheezing, shortness of breath, or cough Abdominal: negative for abdominal pain, nausea, vomiting, diarrhea, or constipation Dermatological: negative for rash Neurologic: negative for headache, dizziness, or syncope All other systems reviewed and are otherwise negative with the exception to those above and in the HPI.   Physical Exam: There were no vitals taken for this visit., There is no weight on file to calculate BMI. General: Well developed, well nourished, in no acute distress. Head: Normocephalic, atraumatic, eyes without discharge, sclera non-icteric, nares are without discharge. Bilateral auditory canals clear, TM's are without perforation, pearly grey and translucent with reflective cone of light bilaterally. Oral cavity moist, posterior pharynx without exudate, erythema, peritonsillar abscess, or post nasal drip.  Neck: Supple. No thyromegaly. Full ROM. No lymphadenopathy. Lungs: Clear bilaterally to auscultation without wheezes, rales, or rhonchi. Breathing is unlabored. Heart: RRR with S1 S2. No murmurs, rubs, or gallops appreciated. Abdomen: Soft, non-tender, non-distended with normoactive bowel sounds. No hepatomegaly. No rebound/guarding. No obvious abdominal masses. Msk:  Strength and tone normal for age. Extremities/Skin: Warm and dry. No clubbing or cyanosis. No edema. No rashes or suspicious lesions. Neuro: Alert and oriented X 3. Moves all extremities spontaneously. Gait is normal. CNII-XII grossly in tact. Psych:  Responds to questions appropriately with a normal  affect.   Labs: Results for orders placed in visit on 12/02/13  IFOBT (OCCULT BLOOD)      Result Value Ref Range   IFOBT Negative    GLUCOSE, POCT (MANUAL RESULT ENTRY)      Result Value Ref Range   POC Glucose 343 (*) 70 - 99 mg/dl  POCT GLYCOSYLATED HEMOGLOBIN (HGB A1C)      Result Value Ref Range   Hemoglobin A1C 12.6       ASSESSMENT AND PLAN:  80 y.o. year old male with Routine general medical examination at a health care facility  Type II or unspecified type diabetes mellitus without mention of complication, not stated as uncontrolled - Plan: EKG 12-Lead, IFOBT POC (occult bld, rslt in office), POCT glucose (manual entry), POCT glycosylated hemoglobin (Hb A1C), Lipid panel, CBC with Differential, Microalbumin, urine, COMPLETE METABOLIC PANEL WITH GFR, TSH, lisinopril (PRINIVIL,ZESTRIL) 10 MG tablet  Unspecified hypothyroidism - Plan: levothyroxine (SYNTHROID, LEVOTHROID) 200 MCG tablet  Other and unspecified hyperlipidemia - Plan: simvastatin (ZOCOR) 20 MG tablet  BPH (benign prostatic hyperplasia) -  Plan: tamsulosin (FLOMAX) 0.4 MG CAPS capsule  Essential hypertension - Plan: lisinopril (PRINIVIL,ZESTRIL) 10 MG tablet, amLODipine (NORVASC) 5 MG tablet  Hypogonadism in male - Plan: testosterone cypionate (DEPOTESTOTERONE CYPIONATE) 200 MG/ML injection, testosterone cypionate (DEPOTESTOTERONE CYPIONATE) injection 200 mg   10:57 AM Informed patient of plans to provide Accu-Chek glucometer as well as change his DM type II medication from Metformin.  Will provide testosterone injection.  Signed, Robyn Haber, MD 12/02/2013 8:16 AM

## 2013-12-03 ENCOUNTER — Other Ambulatory Visit: Payer: Self-pay | Admitting: Family Medicine

## 2013-12-03 DIAGNOSIS — E1159 Type 2 diabetes mellitus with other circulatory complications: Secondary | ICD-10-CM

## 2013-12-03 LAB — COMPLETE METABOLIC PANEL WITH GFR
ALT: 23 U/L (ref 0–53)
AST: 23 U/L (ref 0–37)
Albumin: 4.5 g/dL (ref 3.5–5.2)
Alkaline Phosphatase: 64 U/L (ref 39–117)
BUN: 26 mg/dL — ABNORMAL HIGH (ref 6–23)
CO2: 23 mEq/L (ref 19–32)
Calcium: 10.3 mg/dL (ref 8.4–10.5)
Chloride: 100 mEq/L (ref 96–112)
Creat: 1.35 mg/dL (ref 0.50–1.35)
GFR, Est African American: 57 mL/min — ABNORMAL LOW
GFR, Est Non African American: 50 mL/min — ABNORMAL LOW
Glucose, Bld: 319 mg/dL — ABNORMAL HIGH (ref 70–99)
Potassium: 5.4 mEq/L — ABNORMAL HIGH (ref 3.5–5.3)
Sodium: 137 mEq/L (ref 135–145)
Total Bilirubin: 0.7 mg/dL (ref 0.2–1.2)
Total Protein: 7.6 g/dL (ref 6.0–8.3)

## 2013-12-03 LAB — CBC WITH DIFFERENTIAL/PLATELET
Basophils Absolute: 0.1 10*3/uL (ref 0.0–0.1)
Basophils Relative: 1 % (ref 0–1)
Eosinophils Absolute: 0.2 10*3/uL (ref 0.0–0.7)
Eosinophils Relative: 2 % (ref 0–5)
HCT: 42.1 % (ref 39.0–52.0)
Hemoglobin: 14.2 g/dL (ref 13.0–17.0)
Lymphocytes Relative: 33 % (ref 12–46)
Lymphs Abs: 2.9 10*3/uL (ref 0.7–4.0)
MCH: 28.7 pg (ref 26.0–34.0)
MCHC: 33.7 g/dL (ref 30.0–36.0)
MCV: 85.2 fL (ref 78.0–100.0)
Monocytes Absolute: 0.6 10*3/uL (ref 0.1–1.0)
Monocytes Relative: 7 % (ref 3–12)
Neutro Abs: 5 10*3/uL (ref 1.7–7.7)
Neutrophils Relative %: 57 % (ref 43–77)
Platelets: 238 10*3/uL (ref 150–400)
RBC: 4.94 MIL/uL (ref 4.22–5.81)
RDW: 13.4 % (ref 11.5–15.5)
WBC: 8.8 10*3/uL (ref 4.0–10.5)

## 2013-12-03 LAB — LIPID PANEL
Cholesterol: 166 mg/dL (ref 0–200)
HDL: 42 mg/dL (ref >39)
LDL Cholesterol: 85 mg/dL (ref 0–99)
Total CHOL/HDL Ratio: 4 Ratio
Triglycerides: 196 mg/dL — ABNORMAL HIGH (ref <150)
VLDL: 39 mg/dL (ref 0–40)

## 2013-12-03 LAB — TSH: TSH: 0.026 u[IU]/mL — ABNORMAL LOW (ref 0.350–4.500)

## 2013-12-06 ENCOUNTER — Telehealth: Payer: Self-pay | Admitting: Radiology

## 2013-12-06 NOTE — Telephone Encounter (Signed)
error 

## 2013-12-07 ENCOUNTER — Other Ambulatory Visit: Payer: Self-pay | Admitting: Physician Assistant

## 2013-12-09 ENCOUNTER — Ambulatory Visit (INDEPENDENT_AMBULATORY_CARE_PROVIDER_SITE_OTHER): Payer: Medicare Other | Admitting: Radiology

## 2013-12-09 DIAGNOSIS — E291 Testicular hypofunction: Secondary | ICD-10-CM

## 2013-12-20 ENCOUNTER — Encounter: Payer: Self-pay | Admitting: Endocrinology

## 2013-12-20 ENCOUNTER — Encounter: Payer: Self-pay | Attending: Endocrinology | Admitting: Nutrition

## 2013-12-20 ENCOUNTER — Ambulatory Visit (INDEPENDENT_AMBULATORY_CARE_PROVIDER_SITE_OTHER): Payer: Self-pay | Admitting: Endocrinology

## 2013-12-20 VITALS — BP 122/72 | HR 84 | Temp 98.0°F | Resp 14 | Ht 65.0 in | Wt 182.6 lb

## 2013-12-20 DIAGNOSIS — E1165 Type 2 diabetes mellitus with hyperglycemia: Secondary | ICD-10-CM

## 2013-12-20 DIAGNOSIS — E038 Other specified hypothyroidism: Secondary | ICD-10-CM | POA: Insufficient documentation

## 2013-12-20 DIAGNOSIS — IMO0002 Reserved for concepts with insufficient information to code with codable children: Secondary | ICD-10-CM

## 2013-12-20 DIAGNOSIS — I10 Essential (primary) hypertension: Secondary | ICD-10-CM

## 2013-12-20 DIAGNOSIS — Z713 Dietary counseling and surveillance: Secondary | ICD-10-CM | POA: Insufficient documentation

## 2013-12-20 MED ORDER — GLIPIZIDE ER 5 MG PO TB24: 10.0000 mg | ORAL_TABLET | Freq: Every day | ORAL | Status: DC

## 2013-12-20 MED ORDER — VICTOZA 18 MG/3ML ~~LOC~~ SOPN: 1.2000 mg | PEN_INJECTOR | Freq: Every day | SUBCUTANEOUS | Status: DC

## 2013-12-20 NOTE — Progress Notes (Signed)
Patient ID: Zachary Cook, male   DOB: 04/25/1932, 80 y.o.   MRN: [id number]           Reason for Appointment: Consultation for Type 2 Diabetes  Referring physician: Lauenstein  History of Present Illness:          Diagnosis: Type 2 diabetes mellitus, date of diagnosis: 2005       Past history: He was started on metformin when he was found to have diabetes on routine lab work. Details of this are not available He thinks his blood sugars had been fairly well controlled for several years with metformin alone but no records are available He has had a couple of different physicians and has not always been followed consistently for his diabetes  Recent history:  He feels that his blood sugars were fairly good last year but they have been increasing since then. Previously blood sugars were in the 120s. Blood sugars have been significantly higher in the last 4 months or so He does not think he has gone off his diet or gained any weight because the high readings He together had thinks he has been trying to work harder on his weight loss with diet and exercise He has eliminated sweet drinks like iced tea but still drinking some Cokes He does feel more tired now and has not been able to be as active He does not think he has increased thirst or urination, does get up twice at night to go to the bathroom His weight is about the same as in 8/14 although at some point he was over 200 pounds He does try to check his blood sugars fairly regularly but only in the morning before breakfast Because of his higher readings he was told to start glipizide about 2 weeks ago by his PCP but his blood sugars are not significantly better       Oral hypoglycemic drugs the patient is taking are: Metformin 1 g twice a day      Side effects from medications have been: None   Glucose monitoring:  done one time a day         Glucometer: Accu-Chek       Blood Glucose readings 259-500 recently  by time of day and  ave Self-care: The diet that the patient has been following is: None, usually low fat      Water, 3 cokes a week Meals: 3 meals per day. Breakfast is bran flakes; fried food 1/7 days a week           Exercise:  walking or treadmill 15 min, 3/7         Dietician visit, most recent: None.               Weight history: 175-205  Wt Readings from Last 3 Encounters:  12/20/13 182 lb 9.6 oz (82.827 kg)  12/02/13 179 lb 12.8 oz (81.557 kg)  10/18/12 181 lb 12.8 oz (82.464 kg)    Glycemic control:   Lab Results  Component Value Date   HGBA1C 12.6 12/02/2013   HGBA1C 7.3 10/18/2012   Lab Results  Component Value Date   MICROALBUR 4.3* 12/02/2013   LDLCALC 85 12/02/2013   CREATININE 1.35 12/02/2013         Medication List       This list is accurate as of: 12/20/13 11:56 AM.  Always use your most recent med list.  ACCU-CHEK NANO SMARTVIEW W/DEVICE Kit     ACCU-CHEK SMARTVIEW test strip  Generic drug:  glucose blood     amLODipine 5 MG tablet  Commonly known as:  NORVASC  TAKE 1 TABLET BY MOUTH DAILY     aspirin 81 MG tablet  Take 81 mg by mouth daily.     glipiZIDE 5 MG 24 hr tablet  Commonly known as:  GLUCOTROL XL  TAKE 1 TABLET BY MOUTH DAILY     levothyroxine 200 MCG tablet  Commonly known as:  SYNTHROID, LEVOTHROID  TAKE 1 TABLET BY MOUTH DAILY BEFORE BREAKFAST     lisinopril 10 MG tablet  Commonly known as:  PRINIVIL,ZESTRIL  TAKE 1 TABLET BY MOUTH DAILY     metFORMIN 1000 MG tablet  Commonly known as:  GLUCOPHAGE     NEXIUM 40 MG capsule  Generic drug:  esomeprazole  TAKE ONE CAPSULE BY MOUTH EVERY DAY     simvastatin 20 MG tablet  Commonly known as:  ZOCOR  TAKE 1 TABLET BY MOUTH AT BEDTIME     tamsulosin 0.4 MG Caps capsule  Commonly known as:  FLOMAX  TAKE ONE CAPSULE BY MOUTH DAILY     testosterone cypionate 200 MG/ML injection  Commonly known as:  DEPOTESTOTERONE CYPIONATE  Inject 1 mL (200 mg total) into the muscle every 28  (twenty-eight) days.        Allergies: No Known Allergies  Past Medical History  Diagnosis Date  . Diabetes mellitus without complication     Past Surgical History  Procedure Laterality Date  . Vasectomy      Family History  Problem Relation Age of Onset  . Heart disease Mother   . Cancer Father     Social History:  reports that he has never smoked. He does not have any smokeless tobacco history on file. He reports that he does not drink alcohol or use illicit drugs.    Review of Systems       Vision is normal. Most recent eye exam was 9/15       Lipids: Recent labs as below, currently taking 20 mg Zocor       Lab Results  Component Value Date   CHOL 166 12/02/2013   HDL 42 12/02/2013   LDLCALC 85 12/02/2013   TRIG 196* 12/02/2013   CHOLHDL 4.0 12/02/2013                  Skin: No rash or infections     Thyroid:  He thinks he was found to have hypothyroidism about 5 years ago on routine labs without symptoms. Also do not feel any different with taking the medication. His TSH appears to have been persistently low, free T4 not available Recently told to reduce the dose of 200 mcg daily to 6 days a week   Lab Results  Component Value Date   TSH 0.026* 12/02/2013   TSH 0.011* 10/18/2012       The blood pressure has been controlled with a 3 drug regimen including lisinopril     No swelling of feet.     No shortness of breath or chest tightness  on exertion.     Bowel habits: Normal.      No joint  pains.          No history of Numbness, tingling or burning in feet      No problems with balance except sometimes when he is turning around    He has  been told to have hypogonadism of unclear etiology for the last few years. He was switched from the topical preparation to the injections because he was told that the cream causes heart attacks He has been on injections for at least 2 years but minimal information available on his record   No results found for this  basename: TESTOSTERONE     LABS:  No visits with results within 1 Week(s) from this visit. Latest known visit with results is:  Office Visit on 12/02/2013  Component Date Value Ref Range Status  . IFOBT 12/02/2013 Negative   Final  . POC Glucose 12/02/2013 343* 70 - 99 mg/dl Final  . Hemoglobin A1C 12/02/2013 12.6   Final  . Cholesterol 12/02/2013 166  0 - 200 mg/dL Final   Comment: ATP III Classification:                                < 200        mg/dL        Desirable                               200 - 239     mg/dL        Borderline High                               >= 240        mg/dL        High                             . Triglycerides 12/02/2013 196* <150 mg/dL Final  . HDL 12/02/2013 42  >39 mg/dL Final  . Total CHOL/HDL Ratio 12/02/2013 4.0   Final  . VLDL 12/02/2013 39  0 - 40 mg/dL Final  . LDL Cholesterol 12/02/2013 85  0 - 99 mg/dL Final   Comment:                            Total Cholesterol/HDL Ratio:CHD Risk                                                 Coronary Heart Disease Risk Table                                                                 Men       Women                                   1/2 Average Risk              3.4        3.3  Average Risk              5.0        4.4                                    2X Average Risk              9.6        7.1                                    3X Average Risk             23.4       11.0                          Use the calculated Patient Ratio above and the CHD Risk table                           to determine the patient's CHD Risk.                          ATP III Classification (LDL):                                < 100        mg/dL         Optimal                               100 - 129     mg/dL         Near or Above Optimal                               130 - 159     mg/dL         Borderline High                               160 - 189     mg/dL         High                                  > 190        mg/dL         Very High                             . WBC 12/02/2013 8.8  4.0 - 10.5 K/uL Final  . RBC 12/02/2013 4.94  4.22 - 5.81 MIL/uL Final  . Hemoglobin 12/02/2013 14.2  13.0 - 17.0 g/dL Final  . HCT 12/02/2013 42.1  39.0 - 52.0 % Final  . MCV 12/02/2013 85.2  78.0 - 100.0 fL Final  . MCH 12/02/2013 28.7  26.0 - 34.0 pg Final  . MCHC 12/02/2013 33.7  30.0 - 36.0 g/dL Final  . RDW 12/02/2013 13.4  11.5 - 15.5 %  Final  . Platelets 12/02/2013 238  150 - 400 K/uL Final  . Neutrophils Relative % 12/02/2013 57  43 - 77 % Final  . Neutro Abs 12/02/2013 5.0  1.7 - 7.7 K/uL Final  . Lymphocytes Relative 12/02/2013 33  12 - 46 % Final  . Lymphs Abs 12/02/2013 2.9  0.7 - 4.0 K/uL Final  . Monocytes Relative 12/02/2013 7  3 - 12 % Final  . Monocytes Absolute 12/02/2013 0.6  0.1 - 1.0 K/uL Final  . Eosinophils Relative 12/02/2013 2  0 - 5 % Final  . Eosinophils Absolute 12/02/2013 0.2  0.0 - 0.7 K/uL Final  . Basophils Relative 12/02/2013 1  0 - 1 % Final  . Basophils Absolute 12/02/2013 0.1  0.0 - 0.1 K/uL Final  . Smear Review 12/02/2013 Criteria for review not met   Final  . Microalb, Ur 12/02/2013 4.3* <2.0 mg/dL Final   Comment: ** Please note change in reference range(s). **                          The ADA (Diabetes Care 5449;20(FEOFH 1):S14-S80) has defined                          abnormalities in albumin excretion as follows:                                                              Category           Result                                                     (mg/g creatinine)                                          Normal:    <30                                Microalbuminuria:    30 - 299                            Clinical albuminuria:    > or = 300                                                      The ADA recommends that at least two of three specimens                          collected within a 3 - 6 month period be  abnormal before  considering a patient to be within a diagnostic category.  . Sodium 12/02/2013 137  135 - 145 mEq/L Final  . Potassium 12/02/2013 5.4* 3.5 - 5.3 mEq/L Final   No visible hemolysis.  . Chloride 12/02/2013 100  96 - 112 mEq/L Final  . CO2 12/02/2013 23  19 - 32 mEq/L Final  . Glucose, Bld 12/02/2013 319* 70 - 99 mg/dL Final  . BUN 12/02/2013 26* 6 - 23 mg/dL Final  . Creat 12/02/2013 1.35  0.50 - 1.35 mg/dL Final  . Total Bilirubin 12/02/2013 0.7  0.2 - 1.2 mg/dL Final  . Alkaline Phosphatase 12/02/2013 64  39 - 117 U/L Final  . AST 12/02/2013 23  0 - 37 U/L Final  . ALT 12/02/2013 23  0 - 53 U/L Final  . Total Protein 12/02/2013 7.6  6.0 - 8.3 g/dL Final  . Albumin 12/02/2013 4.5  3.5 - 5.2 g/dL Final  . Calcium 12/02/2013 10.3  8.4 - 10.5 mg/dL Final  . GFR, Est African American 12/02/2013 57*  Final  . GFR, Est Non African American 12/02/2013 50*  Final   Comment:                            The estimated GFR is a calculation valid for adults (>=37 years old)                          that uses the CKD-EPI algorithm to adjust for age and sex. It is                            not to be used for children, pregnant women, hospitalized patients,                             patients on dialysis, or with rapidly changing kidney function.                          According to the NKDEP, eGFR >89 is normal, 60-89 shows mild                          impairment, 30-59 shows moderate impairment, 15-29 shows severe                          impairment and <15 is ESRD.                             . TSH 12/02/2013 0.026* 0.350 - 4.500 uIU/mL Final    Physical Examination:  BP 122/72  Pulse 84  Temp(Src) 98 F (36.7 C)  Resp 14  Ht _0  (1.651 m)  Wt 182 lb 9.6 oz (82.827 kg)  BMI 30.39 kg/m2  SpO2 96%  GENERAL:         Patient has mild generalized obesity.   HEENT:         Eye exam shows normal external appearance. Fundus exam shows no  retinopathy. Oral exam shows normal mucosa .  NECK:         General:  Neck exam shows no lymphadenopathy. Carotids are normal to palpation and no bruit heard.  Thyroid is just palpable on the right side  on swallowing, firm and no nodules felt.   LUNGS:         Chest is symmetrical. Lungs are clear to auscultation.Marland Kitchen   HEART:         Heart sounds:  S1 and S2 are normal. No murmurs or clicks heard., no S3 or S4.   ABDOMEN:   There is no distention present. Liver and spleen are not palpable. No other mass or tenderness present.  EXTREMITIES:     There is no edema. No skin lesions present.Marland Kitchen  NEUROLOGICAL:   Vibration sense is moderately reduced in right toes and absent on the left. Ankle jerks are absent bilaterally.          Diabetic foot exam shows normal monofilament sensation in the toes and plantar surfaces, no skin lesions or ulcers on the feet and normal posterior tibialis pedal pulses MUSCULOSKELETAL:       There is no enlargement or deformity of the joints. Spine is normal to inspection.Marland Kitchen   SKIN:       No rash or lesions of concern.        ASSESSMENT:  Diabetes type 2, uncontrolled with marked hyperglycemia and A1c of 12.6 on metformin alone  He is not very symptomatic except for complaints of fatigue  Recently glipizide was added with only minimal benefit He has been following his diet and exercise regimen to some extent but unable to lose weight He is concerned his weight at the present time and would like to lose more weight has had minimal diabetes education so far  Complications: he has some evidence of neuropathy on exam especially lack of vibration sense; microalbumin relatively high   HYPERTENSION: Appears well controlled   Mild CKD with creatinine 1.35 and GFR 50  Hyperlipidemia: LDL below 100 but HDL is low normal  ? Hypothyroidism: He is on a relatively high dose of Synthroid of 200 mcg and has had persistently low TSH levels  PLAN:  Discussed with the patient the  nature of GLP-1 drugs, the action on various organ systems, how they benefit blood glucose control, as well as the benefit of weight loss and  increase satiety He will be started on Victoza today after instructions from nurse educator.  Explained possible side effects especially nausea and vomiting; discussed safety information in package insert. Described injection technique and dosage titration of Victoza  starting with 0.6 mg once a day at the same time for the first week and then increasing to 1.2 mg if no symptoms of nausea. Patient brochure on Victoza  given Discussed needing to check blood sugars at least half the time after meals and discussed blood sugar targets   Also discussed possibility of adding insulin if blood sugars are not controlled He will continue metformin He will increase glipizide to twice a day until blood sugars are improved    Recheck TSH on next visit   Repeat microalbumin/creatinine ratio when blood sugars are better controlled  Counseling time over 50% of today's 60 minute visit  Camera Krienke 12/20/2013, 11:56 AM   Note: This office note was prepared with Estate agent. Any transcriptional errors that result from this process are unintentional.

## 2013-12-20 NOTE — Patient Instructions (Addendum)
Start VICTOZA injection with the pen once daily at the same time of the day.  Dial the dose to 0.6 mg for the first week.  You may possibly experience nausea in the first few days which usually gets better in a couple of days After 1 week increase the dose to 1.2mg  daily if no nausea present.   You may inject in the stomach, thigh or arm.    You will feel fullness of the stomach with starting the medication and should try to keep portions of food small.  Call us or the [facility] helpline at [phone] or visit [email or web address] for any questions  Glipizide 2x daily  Please check blood sugars at least half the time about 2 hours after any meal and 3-4 times per week on waking up. Please bring blood sugar monitor to each visit

## 2013-12-21 ENCOUNTER — Other Ambulatory Visit: Payer: Self-pay | Admitting: *Deleted

## 2013-12-21 MED ORDER — INSULIN PEN NEEDLE 31G X 5 MM MISC: Status: DC

## 2013-12-21 NOTE — Progress Notes (Signed)
We discussed how the Victoza works to lower his blood sugars, how to dial in the dose, and how to inject this.  He reported good understanding of this and had no questions.  He was given a starter kit to read with this information. We also reviewed how to increase the dose after 7 days if no nausea.  He reported good understanding of this as well.  He was given a sample on Nano pen needles to use with the Victoza.  We also discussed the importance of testing blood sugars, and the goals for those readings.  He will test before and 2hr. pc one meal each day--varying the meal each day.      He had no final questions.

## 2013-12-21 NOTE — Patient Instructions (Addendum)
Take 0.6 of Victoza once a day. Increase the dose after 7 days, if no nausea to 1.2. Test blood sugars before and 2hr. After one meal each day. Call if questions.

## 2014-01-03 ENCOUNTER — Encounter: Payer: Self-pay | Admitting: Family Medicine

## 2014-01-03 ENCOUNTER — Ambulatory Visit (INDEPENDENT_AMBULATORY_CARE_PROVIDER_SITE_OTHER): Payer: Medicare Other | Admitting: Family Medicine

## 2014-01-03 DIAGNOSIS — E291 Testicular hypofunction: Secondary | ICD-10-CM

## 2014-01-03 NOTE — Progress Notes (Signed)
Patient ID: Zachary Cook, male   DOB: 01-07-1933, 80 y.o.   MRN: [id number]  This chart was scribed for Zachary Haber, MD by Ladene Artist, ED Scribe. The patient was seen in room Room/bed info not found. Patient's care was started at 12:44 PM.  Patient ID: Zachary Cook MRN: [id number], DOB: 04-Dec-1932, 80 y.o. Date of Encounter: 01/03/2014, 12:43 PM  Primary Physician: Zachary Haber, MD  No chief complaint on file.  HPI: 81 y.o. year old male with history below presents with need for depo testosterone  He is not having trouble with gait or instability, denies depression, and is staying active doing work around the house.  Low risk of fall.  No urinary incontinence  Cognitive Function  Depression  PAD  Physical Activity  Risk of Fall  Urinary Incontinence    Past Medical History  Diagnosis Date  . Diabetes mellitus without complication      Home Meds: Prior to Admission medications   Medication Sig Start Date End Date Taking? Authorizing Provider  ACCU-CHEK SMARTVIEW test strip  12/02/13   Historical Provider, MD  amLODipine (NORVASC) 5 MG tablet TAKE 1 TABLET BY MOUTH DAILY 12/02/13   Zachary Haber, MD  aspirin 81 MG tablet Take 81 mg by mouth daily.    Historical Provider, MD  Blood Glucose Monitoring Suppl (ACCU-CHEK NANO SMARTVIEW) W/DEVICE KIT  12/02/13   Historical Provider, MD  glipiZIDE (GLUCOTROL XL) 5 MG 24 hr tablet Take 2 tablets (10 mg total) by mouth daily with breakfast. 12/20/13   Elayne Snare, MD  Insulin Pen Needle 31G X 5 MM MISC Use one per day with Victoza 12/21/13   Elayne Snare, MD  levothyroxine (SYNTHROID, LEVOTHROID) 200 MCG tablet TAKE 1 TABLET BY MOUTH DAILY BEFORE BREAKFAST 12/02/13   Zachary Haber, MD  lisinopril (PRINIVIL,ZESTRIL) 10 MG tablet TAKE 1 TABLET BY MOUTH DAILY 12/02/13   Zachary Haber, MD  metFORMIN (GLUCOPHAGE) 1000 MG tablet  09/27/13   Historical Provider, MD  NEXIUM 40 MG capsule TAKE ONE CAPSULE BY MOUTH EVERY DAY  12/08/13   Zachary Haber, MD  simvastatin (ZOCOR) 20 MG tablet TAKE 1 TABLET BY MOUTH AT BEDTIME 12/02/13   Zachary Haber, MD  tamsulosin (FLOMAX) 0.4 MG CAPS capsule TAKE ONE CAPSULE BY MOUTH DAILY 12/02/13   Zachary Haber, MD  testosterone cypionate (DEPOTESTOTERONE CYPIONATE) 200 MG/ML injection Inject 1 mL (200 mg total) into the muscle every 28 (twenty-eight) days. 12/02/13   Zachary Haber, MD  VICTOZA 18 MG/3ML SOPN Inject 1.2 mg into the skin daily. Inject once daily at the same time 12/20/13   Elayne Snare, MD    Allergies: No Known Allergies  History   Social History  . Marital Status: Married    Spouse Name: N/A    Number of Children: N/A  . Years of Education: N/A   Occupational History  . Not on file.   Social History Main Topics  . Smoking status: Never Smoker   . Smokeless tobacco: Not on file  . Alcohol Use: No  . Drug Use: No  . Sexual Activity: Not on file   Other Topics Concern  . Not on file   Social History Narrative  . No narrative on file     Review of Systems: Constitutional: negative for chills, fever, night sweats, weight changes, or fatigue  HEENT: negative for vision changes, hearing loss, congestion, rhinorrhea, ST, epistaxis, or sinus pressure Cardiovascular: negative for chest pain or palpitations Respiratory: negative for hemoptysis, wheezing, shortness of breath,  or cough Abdominal: negative for abdominal pain, nausea, vomiting, diarrhea, or constipation Dermatological: negative for rash Neurologic: negative for headache, dizziness, or syncope All other systems reviewed and are otherwise negative with the exception to those above and in the HPI.    ASSESSMENT AND PLAN:  80 y.o. year old male with  Hypogonadism  I personally performed the services described in this documentation, which was scribed in my presence. The recorded information has been reviewed and is accurate.  Signed, Zachary Haber, MD 01/03/2014 12:43 PM

## 2014-01-06 ENCOUNTER — Encounter: Payer: Self-pay | Admitting: Family Medicine

## 2014-01-06 ENCOUNTER — Ambulatory Visit (INDEPENDENT_AMBULATORY_CARE_PROVIDER_SITE_OTHER): Payer: Medicare Other | Admitting: Family Medicine

## 2014-01-06 VITALS — BP 120/70 | HR 84 | Temp 98.1°F | Resp 16 | Ht 64.75 in | Wt 177.2 lb

## 2014-01-06 DIAGNOSIS — E119 Type 2 diabetes mellitus without complications: Secondary | ICD-10-CM

## 2014-01-06 DIAGNOSIS — R5382 Chronic fatigue, unspecified: Secondary | ICD-10-CM

## 2014-01-06 NOTE — Progress Notes (Signed)
° °  Subjective:    Patient ID: Zachary Cook, male    DOB: 03/19/1932, 80 y.o.   MRN: [id number] This chart was scribed for Zachary Haber, MD by Zola Button, Medical Scribe. This patient was seen in Room 25 and the patient's care was started at 9:33 AM.   HPI HPI Comments: Zachary Cook is a 80 y.o. male who presents to the [facility] for a 3 week follow-up. Patient notes still having fatigue that he noticed 1 year ago; he notes being tired after activities such as showering. Since starting the new glucometer, he has been getting CBG readings between 81-134. He normally measures his CBG around 10am. Patient has normal feeling in his feet. He received new glasses 3 weeks ago and was told his eyes showed no sign of DM. Patient notes having some constipation, but he thinks it may be due to his medications. He denies any urinary symptoms. He had a testosterone shot 3 days ago. Patient will see Dr. Dwyane Dee next week. He is UTD on his flu shot and had a PNA shot last visit on 12/20/13. Patient states he is active around his house, inside and outside.  Patient's wife has been doing well, except that she has been coughing. Patient attributes it to her smoking habits.  Patient is currently retired; he used to work for [employer]. 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   Wt Readings from Last 3 Encounters:  01/06/14 177 lb 3.2 oz (80.377 kg)  12/20/13 182 lb 9.6 oz (82.827 kg)  12/02/13 179 lb 12.8 oz (81.557 kg)     Review of Systems  Constitutional: Positive for fatigue.  Eyes: Negative for visual disturbance.  Gastrointestinal: Positive for constipation.  Genitourinary: Negative for dysuria, urgency, frequency, hematuria, decreased urine volume and difficulty urinating.  Allergic/Immunologic: Positive for food allergies.  Neurological: Negative for numbness.       Objective:   Physical Exam CONSTITUTIONAL: Well  developed/well nourished HEAD: Normocephalic/atraumatic EYES: EOM/PERRL ENMT: Mucous membranes moist NECK: supple no meningeal signs SPINE: entire spine nontender CV: S1/S2 noted, no murmurs/rubs/gallops noted LUNGS: Lungs are clear to auscultation bilaterally, no apparent distress ABDOMEN: soft, nontender, no rebound or guarding GU: no cva tenderness NEURO: Pt is awake/alert, moves all extremitiesx4 EXTREMITIES: pulses normal, full ROM SKIN: warm, color normal PSYCH: no abnormalities of mood noted  Monofilament testing was done today and reported as normal      Assessment & Plan:   Remarkable improvement in sugars.  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  His 5 lb weight loss is also commended.  Recent eye exam found no problems and his vision is good.  His persistent fatigue is still a problem.  He is almost 82 so that is not unexpected.  The testosterone does not seem to be solving the problem and I am reluctant to perform other testing at this point.  He will be seeing Dr. Dwyane Dee in a few days.  Thanks to Dr. Raliegh Ip for an amazing improvement in blood sugars.  We reviewed the blood sugar log and what to do if sugars drop below 80.    Perhaps Dr. Dwyane Dee has some suggestions for the fatigue problem.  I plan to see patient around Christmas.  Signed, Zachary Haber, MD

## 2014-01-10 ENCOUNTER — Other Ambulatory Visit (INDEPENDENT_AMBULATORY_CARE_PROVIDER_SITE_OTHER): Payer: 59

## 2014-01-10 DIAGNOSIS — E038 Other specified hypothyroidism: Secondary | ICD-10-CM

## 2014-01-10 DIAGNOSIS — IMO0002 Reserved for concepts with insufficient information to code with codable children: Secondary | ICD-10-CM

## 2014-01-10 DIAGNOSIS — E1165 Type 2 diabetes mellitus with hyperglycemia: Secondary | ICD-10-CM

## 2014-01-10 LAB — RENAL FUNCTION PANEL
Albumin: 3.5 g/dL (ref 3.5–5.2)
BUN: 21 mg/dL (ref 6–23)
CO2: 24 mEq/L (ref 19–32)
CREATININE: 1.4 mg/dL (ref 0.4–1.5)
Calcium: 9.4 mg/dL (ref 8.4–10.5)
Chloride: 102 mEq/L (ref 96–112)
GFR: 50.24 mL/min — AB (ref >60.00)
GLUCOSE: 136 mg/dL — AB (ref 70–99)
Phosphorus: 3 mg/dL (ref 2.3–4.6)
Potassium: 5 mEq/L (ref 3.5–5.1)
SODIUM: 135 meq/L (ref 135–145)

## 2014-01-10 LAB — TSH: TSH: 0.03 u[IU]/mL — AB (ref 0.35–4.50)

## 2014-01-10 LAB — T4, FREE: Free T4: 1.84 ng/dL — ABNORMAL HIGH (ref 0.60–1.60)

## 2014-01-12 LAB — FRUCTOSAMINE: FRUCTOSAMINE: 294 umol/L — AB (ref 190–270)

## 2014-01-13 ENCOUNTER — Other Ambulatory Visit: Payer: Self-pay | Admitting: Family Medicine

## 2014-01-13 ENCOUNTER — Encounter: Payer: Self-pay | Admitting: Endocrinology

## 2014-01-13 ENCOUNTER — Ambulatory Visit (INDEPENDENT_AMBULATORY_CARE_PROVIDER_SITE_OTHER): Payer: 59 | Admitting: Endocrinology

## 2014-01-13 VITALS — BP 124/84 | HR 82 | Temp 98.5°F | Resp 16 | Ht 64.75 in | Wt 177.4 lb

## 2014-01-13 DIAGNOSIS — IMO0002 Reserved for concepts with insufficient information to code with codable children: Secondary | ICD-10-CM

## 2014-01-13 DIAGNOSIS — E1165 Type 2 diabetes mellitus with hyperglycemia: Secondary | ICD-10-CM

## 2014-01-13 DIAGNOSIS — E038 Other specified hypothyroidism: Secondary | ICD-10-CM

## 2014-01-13 NOTE — Progress Notes (Signed)
Patient ID: Zachary Cook, male   DOB: 09/14/1932, 80 y.o.   MRN: [id number]           Reason for Appointment: Follow-Up for Type 2 Diabetes  Referring physician: Lauenstein  History of Present Illness:          Diagnosis: Type 2 diabetes mellitus, date of diagnosis: 2005       Past history: He was started on metformin when he was found to have diabetes on routine lab work. Details of this are not available He thinks his blood sugars had been fairly well controlled for several years with metformin alone but no records are available He has had a couple of different physicians and has not always been followed consistently for his diabetes  Recent history:   Blood sugars had been significantly higher in the last 4 months prior to his consultation in 10/15 Even with improving his diet and trying to exercise his A1c had gone up to 12% with his regimen of glipizide and metformin He was started on Victoza and he has titrated this to 1.2 mg. He has had some nausea with this although not consistent and is getting better, did have some nausea and mild vomiting today Also his glipizide was increased to twice a day He does feel a little less tired and he thinks his portion control is improved His blood sugars have progressively improved and recent readings are near normal However he has checked readings only before his first meal No symptoms of hypoglycemia       Oral hypoglycemic drugs the patient is taking are: Metformin 1 g twice a day, glipizide ER 5 mg twice a day    Side effects from medications have been: mild nausea from Victoza   Glucose monitoring:  done one time a day         Glucometer: Accu-Chek       Blood Glucose readings  81-134 recently  Self-care: The diet that the patient has been following is: None, usually low fat      Water, cutting back on regular soft drinks Meals: 3 meals per day. Breakfast is bran flakes; fried food 1/7 days a week           Exercise:  walking or  treadmill 15 min, 3/7         Dietician visit, most recent: None.               Weight history: 175-205  Wt Readings from Last 3 Encounters:  01/13/14 177 lb 6.4 oz (80.468 kg)  01/06/14 177 lb 3.2 oz (80.377 kg)  12/20/13 182 lb 9.6 oz (82.827 kg)    Glycemic control:   Lab Results  Component Value Date   HGBA1C 12.6 12/02/2013   HGBA1C 7.3 10/18/2012   Lab Results  Component Value Date   MICROALBUR 4.3* 12/02/2013   LDLCALC 85 12/02/2013   CREATININE 1.4 01/10/2014         Medication List       This list is accurate as of: 01/13/14 10:00 AM.  Always use your most recent med list.               ACCU-CHEK NANO SMARTVIEW W/DEVICE Kit     ACCU-CHEK SMARTVIEW test strip  Generic drug:  glucose blood     amLODipine 5 MG tablet  Commonly known as:  NORVASC  TAKE 1 TABLET BY MOUTH DAILY     aspirin 81 MG tablet  Take 81 mg by  mouth daily.     glipiZIDE 5 MG 24 hr tablet  Commonly known as:  GLUCOTROL XL  Take 2 tablets (10 mg total) by mouth daily with breakfast.     Insulin Pen Needle 31G X 5 MM Misc  Use one per day with Victoza     levothyroxine 200 MCG tablet  Commonly known as:  SYNTHROID, LEVOTHROID  TAKE 1 TABLET BY MOUTH DAILY BEFORE BREAKFAST     lisinopril 10 MG tablet  Commonly known as:  PRINIVIL,ZESTRIL  TAKE 1 TABLET BY MOUTH DAILY     metFORMIN 1000 MG tablet  Commonly known as:  GLUCOPHAGE     NEXIUM 40 MG capsule  Generic drug:  esomeprazole  TAKE ONE CAPSULE BY MOUTH EVERY DAY     simvastatin 20 MG tablet  Commonly known as:  ZOCOR  TAKE 1 TABLET BY MOUTH AT BEDTIME     tamsulosin 0.4 MG Caps capsule  Commonly known as:  FLOMAX  TAKE ONE CAPSULE BY MOUTH DAILY     testosterone cypionate 200 MG/ML injection  Commonly known as:  DEPOTESTOTERONE CYPIONATE  Inject 1 mL (200 mg total) into the muscle every 28 (twenty-eight) days.     VICTOZA 18 MG/3ML Sopn  Generic drug:  Liraglutide  Inject 1.2 mg into the skin daily. Inject  once daily at the same time        Allergies: No Known Allergies  Past Medical History  Diagnosis Date  . Diabetes mellitus without complication     Past Surgical History  Procedure Laterality Date  . Vasectomy      Family History  Problem Relation Age of Onset  . Heart disease Mother   . Diabetes Mother   . Cancer Father     Social History:  reports that he has never smoked. He does not have any smokeless tobacco history on file. He reports that he does not drink alcohol or use illicit drugs.    Review of Systems       Vision is normal. Most recent eye exam was 9/15       Lipids: Recent labs as below, currently taking 20 mg Zocor       Lab Results  Component Value Date   CHOL 166 12/02/2013   HDL 42 12/02/2013   LDLCALC 85 12/02/2013   TRIG 196* 12/02/2013   CHOLHDL 4.0 12/02/2013                  Skin: No rash or infections     Thyroid:  He thinks he was found to have hypothyroidism about 5 years ago on routine labs without symptoms. Also do not feel any different with taking the medication. 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 He was switched from the topical preparation to the injections because he was told that the cream causes heart attacks He has been on injections for at least 2 years but minimal information available on his record   No results  found for: TESTOSTERONE   LABS:  Appointment on 01/10/2014  Component Date Value Ref Range Status  . TSH 01/10/2014 0.03* 0.35 - 4.50 uIU/mL Final  . Free T4 01/10/2014  1.84* 0.60 - 1.60 ng/dL Final  . Fructosamine 01/10/2014 294* 190 - 270 umol/L Final  . Sodium 01/10/2014 135  135 - 145 mEq/L Final  . Potassium 01/10/2014 5.0  3.5 - 5.1 mEq/L Final  . Chloride 01/10/2014 102  96 - 112 mEq/L Final  . CO2 01/10/2014 24  19 - 32 mEq/L Final  . Calcium 01/10/2014 9.4  8.4 - 10.5 mg/dL Final  . Albumin 01/10/2014 3.5  3.5 - 5.2 g/dL Final  . BUN 01/10/2014 21  6 - 23 mg/dL Final  . Creatinine, Ser 01/10/2014 1.4  0.4 - 1.5 mg/dL Final  . Glucose, Bld 01/10/2014 136* 70 - 99 mg/dL Final  . Phosphorus 01/10/2014 3.0  2.3 - 4.6 mg/dL Final  . GFR 01/10/2014 50.24* >60.00 mL/min Final    Physical Examination:  BP 124/84 mmHg  Pulse 82  Temp(Src) 98.5 F (36.9 C)  Resp 16  Ht 5' 4.75" (1.645 m)  Wt 177 lb 6.4 oz (80.468 kg)  BMI 29.74 kg/m2  SpO2 96%         ASSESSMENT:  Diabetes type 2, uncontrolled with significant hyperglycemia previously on metformin and low-dose glipizide With starting Victoza and titrating this to 1.2 mg his blood sugars have been remarkably better and nearly normal in the morning However he is checking blood sugars only fasting Since he has had some readings as low as 81 may get by with less glipizide and avoid hypoglycemia  ? Hypothyroidism: He is on a relatively high dose of Synthroid of 200 mcg 6 days a week and has had persistently low TSH levels along with high free T4 This may be causing his fatigue.  PLAN:  Continue 1.2  Of the Victoza but no nausea is persistent issue he can try 0.9 mg as demonstrated today  Reduce glipizide to 1 tablet daily  Reduce Synthroid to 5 days a week   Recheck TSH on next visit   Repeat microalbumin/creatinine ratio when blood sugars are better controlled  Patient Instructions  Synthroid: skip on Sundays and Wednesdays  Please check blood sugars at least half the time about 2 hours after any meal and 3 times per week on waking up.  Please bring blood sugar monitor to each  visit  Reduce Glipizide to 1 per day      [facility] 01/13/2014, 10:00 AM   Note: This office note was prepared with Estate agent. Any transcriptional errors that result from this process are unintentional.

## 2014-01-13 NOTE — Patient Instructions (Addendum)
Synthroid: skip on Sundays and Wednesdays  Please check blood sugars at least half the time about 2 hours after any meal and 3 times per week on waking up.  Please bring blood sugar monitor to each visit  Reduce Glipizide to 1 per day

## 2014-01-31 ENCOUNTER — Ambulatory Visit (INDEPENDENT_AMBULATORY_CARE_PROVIDER_SITE_OTHER): Payer: Medicare Other | Admitting: Family Medicine

## 2014-01-31 VITALS — BP 106/71 | HR 95

## 2014-01-31 DIAGNOSIS — E291 Testicular hypofunction: Secondary | ICD-10-CM

## 2014-01-31 NOTE — Progress Notes (Signed)
   Subjective:    Patient ID: Zachary Cook, male    DOB: 11-11-1932, 80 y.o.   MRN: [id number]  HPI  Patient here for testosterone injection.      Review of Systems     Objective:   Physical Exam        Assessment & Plan:

## 2014-02-07 ENCOUNTER — Telehealth: Payer: Self-pay | Admitting: Family Medicine

## 2014-02-07 NOTE — Telephone Encounter (Signed)
Patient need authorization for medication refill on Metformin 1000 MG. [facility] on [address]. Please contact patient when refill is sent over to pharmacy [phone].

## 2014-02-07 NOTE — Telephone Encounter (Signed)
Pt sees Dr. Dwyane Dee for endrochronology. Advised pt to contact their office for refills.

## 2014-02-21 ENCOUNTER — Other Ambulatory Visit (INDEPENDENT_AMBULATORY_CARE_PROVIDER_SITE_OTHER): Payer: 59

## 2014-02-21 DIAGNOSIS — E038 Other specified hypothyroidism: Secondary | ICD-10-CM

## 2014-02-21 DIAGNOSIS — E1165 Type 2 diabetes mellitus with hyperglycemia: Secondary | ICD-10-CM

## 2014-02-21 DIAGNOSIS — IMO0002 Reserved for concepts with insufficient information to code with codable children: Secondary | ICD-10-CM

## 2014-02-21 LAB — GLUCOSE, RANDOM: Glucose, Bld: 203 mg/dL — ABNORMAL HIGH (ref 70–99)

## 2014-02-21 LAB — T4, FREE: Free T4: 1.47 ng/dL (ref 0.60–1.60)

## 2014-02-21 LAB — TSH: TSH: 0.23 u[IU]/mL — ABNORMAL LOW (ref 0.35–4.50)

## 2014-02-21 LAB — HEMOGLOBIN A1C: Hgb A1c MFr Bld: 7.4 % — ABNORMAL HIGH (ref 4.6–6.5)

## 2014-02-24 ENCOUNTER — Ambulatory Visit (INDEPENDENT_AMBULATORY_CARE_PROVIDER_SITE_OTHER): Payer: 59 | Admitting: Endocrinology

## 2014-02-24 ENCOUNTER — Encounter: Payer: Self-pay | Admitting: Family Medicine

## 2014-02-24 ENCOUNTER — Ambulatory Visit (INDEPENDENT_AMBULATORY_CARE_PROVIDER_SITE_OTHER): Payer: Medicare Other | Admitting: Family Medicine

## 2014-02-24 ENCOUNTER — Encounter: Payer: Self-pay | Admitting: Endocrinology

## 2014-02-24 VITALS — BP 123/73 | HR 80 | Temp 98.4°F | Resp 16 | Ht 64.5 in | Wt 176.0 lb

## 2014-02-24 VITALS — BP 127/89 | HR 88 | Temp 98.0°F | Resp 14 | Ht 64.5 in | Wt 177.6 lb

## 2014-02-24 DIAGNOSIS — R21 Rash and other nonspecific skin eruption: Secondary | ICD-10-CM

## 2014-02-24 DIAGNOSIS — E119 Type 2 diabetes mellitus without complications: Secondary | ICD-10-CM

## 2014-02-24 DIAGNOSIS — IMO0002 Reserved for concepts with insufficient information to code with codable children: Secondary | ICD-10-CM

## 2014-02-24 DIAGNOSIS — E1165 Type 2 diabetes mellitus with hyperglycemia: Secondary | ICD-10-CM

## 2014-02-24 MED ORDER — CANAGLIFLOZIN 100 MG PO TABS: 100.0000 mg | ORAL_TABLET | Freq: Every day | ORAL | Status: DC

## 2014-02-24 MED ORDER — KETOCONAZOLE 2 % EX CREA: 1.0000 "application " | TOPICAL_CREAM | Freq: Every day | CUTANEOUS | Status: DC

## 2014-02-24 MED ORDER — GLIPIZIDE ER 5 MG PO TB24
5.0000 mg | ORAL_TABLET | Freq: Every day | ORAL | Status: DC
Start: 2014-02-24 — End: 2014-06-06

## 2014-02-24 NOTE — Progress Notes (Addendum)
Patient ID: Zachary Cook, male   DOB: 05-30-1932, 80 y.o.   MRN: [id number]           Reason for Appointment: Follow-Up for Type 2 Diabetes  Referring physician: Lauenstein  History of Present Illness:          Diagnosis: Type 2 diabetes mellitus, date of diagnosis: 2005       Past history: He was started on metformin when he was found to have diabetes on routine lab work. Details of this are not available He thinks his blood sugars had been fairly well controlled for several years with metformin alone but no records are available He has had a couple of different physicians and has not always been followed consistently for his diabetes  Blood sugars had been significantly higher in the 4 months prior to his consultation in 10/15 and his A1c had gone up to 12% with his regimen of glipizide and metformin  Recent history:   He was started on Victoza in 10/15 and he had titrated this to 1.2 mg. Although his first follow-up visit he says that he was having only occasional nausea and this was improving and now thinks that his nausea is worse when he is also getting diarrhea every other day.  Also occasional vomiting This is despite his being instructed to take only 5 clicks over the 0.6 mg dose on the last visit Blood sugars had improved significantly and his glipizide was reduced to once a day only Recent A1c is much better although still over 7% His weight is stable Did not bring his monitor for download today.  Again he is checking his readings only in the morning despite asking him to alternate fasting and postprandial He was seen by his PCP today and he was given a prescription for Invokana to try instead of Victoza Overall he is still fairly active and generally watches his diet No symptoms of hypoglycemia       Oral hypoglycemic drugs the patient is taking are: Metformin 1 g twice a day, glipizide ER 5 mg once a day    Side effects from medications have been: mild nausea from  Victoza   Glucose monitoring:  done 0.5 times a day         Glucometer: Accu-Chek       Blood Glucose readings   PRE-MEAL Breakfast Lunch  1-3 PM   8 PM  Overall  Glucose range:  87-163   121, 237   86, 100   140    Mean/median:  127      134     Self-care: The diet that the patient has been following is: None, usually low fat      Water, no regular soft drinks Meals: 3 meals per day. Breakfast is usually none; bran flakes; fried food 1/7 days a week           Exercise:  walking or treadmill 15 min, 3/7         Dietician visit, most recent: None.               Weight history: Previous range  175-205  Wt Readings from Last 3 Encounters:  02/24/14 177 lb 9.6 oz (80.559 kg)  02/24/14 176 lb (79.833 kg)  01/13/14 177 lb 6.4 oz (80.468 kg)    Glycemic control:   Lab Results  Component Value Date   HGBA1C 7.4* 02/21/2014   HGBA1C 12.6 12/02/2013   HGBA1C 7.3 10/18/2012   Lab Results  Component Value Date   MICROALBUR 4.3* 12/02/2013   LDLCALC 85 12/02/2013   CREATININE 1.4 01/10/2014         Medication List       This list is accurate as of: 02/24/14 11:59 PM.  Always use your most recent med list.               ACCU-CHEK NANO SMARTVIEW W/DEVICE Kit     ACCU-CHEK SMARTVIEW test strip  Generic drug:  glucose blood     amLODipine 5 MG tablet  Commonly known as:  NORVASC  TAKE 1 TABLET BY MOUTH DAILY     aspirin 81 MG tablet  Take 81 mg by mouth daily.     canagliflozin 100 MG Tabs tablet  Commonly known as:  INVOKANA  Take 1 tablet (100 mg total) by mouth daily.     esomeprazole 40 MG capsule  Commonly known as:  NEXIUM  Take 1 capsule (40 mg total) by mouth daily.     glipiZIDE 5 MG 24 hr tablet  Commonly known as:  GLIPIZIDE XL  Take 1 tablet (5 mg total) by mouth daily with breakfast.     Insulin Pen Needle 31G X 5 MM Misc  Use one per day with Victoza     ketoconazole 2 % cream  Commonly known as:  NIZORAL  Apply 1 application topically  daily.     levothyroxine 200 MCG tablet  Commonly known as:  SYNTHROID, LEVOTHROID  TAKE 1 TABLET BY MOUTH DAILY BEFORE BREAKFAST     lisinopril 10 MG tablet  Commonly known as:  PRINIVIL,ZESTRIL  TAKE 1 TABLET BY MOUTH DAILY     metFORMIN 1000 MG tablet  Commonly known as:  GLUCOPHAGE  TAKE 1 TABLET BY MOUTH TWICE DAILY WITH A MEAL     simvastatin 20 MG tablet  Commonly known as:  ZOCOR  TAKE 1 TABLET BY MOUTH AT BEDTIME     tamsulosin 0.4 MG Caps capsule  Commonly known as:  FLOMAX  TAKE ONE CAPSULE BY MOUTH DAILY     testosterone cypionate 200 MG/ML injection  Commonly known as:  DEPOTESTOTERONE CYPIONATE  Inject 1 mL (200 mg total) into the muscle every 28 (twenty-eight) days.     VICTOZA 18 MG/3ML Sopn  Generic drug:  Liraglutide        Allergies: No Known Allergies  Past Medical History  Diagnosis Date  . Diabetes mellitus without complication     Past Surgical History  Procedure Laterality Date  . Vasectomy      Family History  Problem Relation Age of Onset  . Heart disease Mother   . Diabetes Mother   . Cancer Father     Social History:  reports that he has never smoked. He does not have any smokeless tobacco history on file. He reports that he does not drink alcohol or use illicit drugs.    Review of Systems       Vision is normal. Most recent eye exam was 9/15       Lipids: Recent labs as below, currently taking 20 mg Zocor       Lab Results  Component Value Date   CHOL 166 12/02/2013   HDL 42 12/02/2013   LDLCALC 85 12/02/2013   TRIG 196* 12/02/2013   CHOLHDL 4.0 12/02/2013                  Skin: For the last couple of weeks he has had a rash on  his right upper thigh and also lower abdomen which is not associated with itching much.  He thinks it was looking like a bruise initially His PCP has given him an antifungal cream     Thyroid:  He thinks he was found to have hypothyroidism about 5 years ago on routine labs without  symptoms. Apparently he did not  feel any different with taking the thyroid supplement.  His TSH record indicates that it had been consistently low In 9/15 his dose was reduced from 200 g daily to  6 tablets a week but because of continued low TSH and high free T4 he was told to take this 5 days a week. 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 He was switched from the topical preparation to the injections because he was told that the cream causes heart attacks He has been on injections for at least 2 years but No details of his evaluation or follow-up levels are available  No results found for: TESTOSTERONE   LABS:  Appointment on 02/21/2014  Component Date Value Ref Range Status  . Hgb A1c MFr Bld 02/21/2014 7.4* 4.6 - 6.5 % Final   Glycemic Control Guidelines for People with Diabetes:Non Diabetic:  <6%Goal of Therapy: <7%Additional Action Suggested:  >8%   . TSH 02/21/2014 0.23* 0.35 - 4.50 uIU/mL Final  . Free T4 02/21/2014 1.47  0.60 - 1.60 ng/dL Final  . Glucose, Bld 02/21/2014 203* 70 - 99 mg/dL Final    Physical Examination:  BP 127/89 mmHg  Pulse 88  Temp(Src) 98 F (36.7 C)  Resp 14  Ht 5' 4.5" (1.638 m)  Wt 177 lb 9.6 oz (80.559 kg)  BMI 30.03 kg/m2  SpO2 95%     He has violaceous patches of skin darkening on the right upper thigh near the groin and similar patch on the lower abdomen.  The rash on the lower abdomen appears to have ring around the margin and no significant scaling, erythema, warmth or exfoliation     ASSESSMENT:  Diabetes type 2, previously  with significant  hyperglycemia on metformin and low-dose glipizide With Victoza  1.2 mg his blood sugars had been much improved  and nearly normal in the morning He was initially tolerating this very well but now he thinks he is having significant GI side effects  Again he is checking blood sugars only fasting  ? Hypothyroidism:  Had been on relatively high dose of Synthroid of 200 mcg 6 days a week and TSH is now nearly normal with taking this 5 days a week and he is subjectively feeling much better    Skin rash: Agree with trying antifungal cream first  PLAN:  He can try Invokana that was recommended by his PCP instead of Victoza.  Information given on this  Since he may tend to have mild hypotension with Invokana and his blood pressure with PCP was relatively low week and try to hold his lisinopril for now.  He can monitor blood pressure at home also  Continue  glipizide1 tablet daily  If he is unable to get good control especially postprandial hyperglycemia may consider adding Januvia  Discussed timing and frequency of glucose monitoring  Reduce Synthroid to137 g daily  Recheck TSH on next visit   Regular exercise and balanced low fat meals  Patient Instructions  Victoza 0.6 daily+ then stop Invokana in am  Stop Lisinopril till next visit  Please check blood sugars at least half the time about 2 hours after any meal and times per week on waking up. Please bring blood sugar monitor to each visit    Counseling time over 50% of today's 25 minute visit   Pierrette Scheu 02/25/2014, 1:08 PM   Note: This office note was prepared with Estate agent. Any transcriptional errors that result from this process are unintentional.

## 2014-02-24 NOTE — Patient Instructions (Addendum)
Victoza 0.6 daily+ then stop Invokana in am  Stop Lisinopril till next visit  Please check blood sugars at least half the time about 2 hours after any meal and times per week on waking up. Please bring blood sugar monitor to each visit

## 2014-02-24 NOTE — Patient Instructions (Signed)

## 2014-02-24 NOTE — Progress Notes (Addendum)
Patient ID: Zachary Cook MRN: [id number], DOB: 12-Oct-1932, 80 y.o. Date of Encounter: 02/24/2014, 10:19 AM  Primary Physician: Robyn Haber, MD  Chief Complaint: Diabetes follow up  HPI: 80 y.o. year old male with history below presents for follow up of diabetes mellitus. Doing well. No issues or complaints. Taking medications daily with adverse effects:  Nausea, indigestion, and diarrhea. No polydipsia, polyphagia, polyuria, or nocturia.  Blood sugars at home:  Around 100  Better energy on new thyroid medication  Exercising regularly. Last A1C:  Lab Results  Component Value Date   HGBA1C 7.4* 02/21/2014    Eye MD:  Vision works recently: no retinopathy DDS: Dr. Lovena Le, Doren Custard (cleaning last week) Influenza vaccine: 11/2013 Pneumococcal vaccine:  11/2013  Patient has also had a nonpruritic, non painful rash on right proximal thigh for several weeks which has spread to corresponding area of right lower quadrant and several satellite lesions.  Sharp dermarcation.  Past Medical History  Diagnosis Date  . Diabetes mellitus without complication      Home Meds: Prior to Admission medications   Medication Sig Start Date End Date Taking? Authorizing Provider  ACCU-CHEK SMARTVIEW test strip  12/02/13  Yes Historical Provider, MD  amLODipine (NORVASC) 5 MG tablet TAKE 1 TABLET BY MOUTH DAILY 12/02/13  Yes Robyn Haber, MD  aspirin 81 MG tablet Take 81 mg by mouth daily.   Yes Historical Provider, MD  Blood Glucose Monitoring Suppl (ACCU-CHEK NANO SMARTVIEW) W/DEVICE KIT  12/02/13  Yes Historical Provider, MD  esomeprazole (NEXIUM) 40 MG capsule Take 1 capsule (40 mg total) by mouth daily. 01/14/14  Yes Robyn Haber, MD  glipiZIDE (GLUCOTROL XL) 5 MG 24 hr tablet Take 2 tablets (10 mg total) by mouth daily with breakfast. 12/20/13  Yes Elayne Snare, MD  Insulin Pen Needle 31G X 5 MM MISC Use one per day with Victoza 12/21/13  Yes Elayne Snare, MD  levothyroxine (SYNTHROID,  LEVOTHROID) 200 MCG tablet TAKE 1 TABLET BY MOUTH DAILY BEFORE BREAKFAST 12/02/13  Yes Robyn Haber, MD  lisinopril (PRINIVIL,ZESTRIL) 10 MG tablet TAKE 1 TABLET BY MOUTH DAILY 12/02/13  Yes Robyn Haber, MD  metFORMIN (GLUCOPHAGE) 1000 MG tablet TAKE 1 TABLET BY MOUTH TWICE DAILY WITH A MEAL 02/07/14  Yes Chelle S Jeffery, PA-C  simvastatin (ZOCOR) 20 MG tablet TAKE 1 TABLET BY MOUTH AT BEDTIME 12/02/13  Yes Robyn Haber, MD  tamsulosin (FLOMAX) 0.4 MG CAPS capsule TAKE ONE CAPSULE BY MOUTH DAILY 12/02/13  Yes Robyn Haber, MD  testosterone cypionate (DEPOTESTOTERONE CYPIONATE) 200 MG/ML injection Inject 1 mL (200 mg total) into the muscle every 28 (twenty-eight) days. 12/02/13  Yes Robyn Haber, MD  VICTOZA 18 MG/3ML SOPN Inject 1.2 mg into the skin daily. Inject once daily at the same time 12/20/13  Yes Elayne Snare, MD    Allergies: No Known Allergies  History   Social History  . Marital Status: Married    Spouse Name: N/A    Number of Children: N/A  . Years of Education: N/A   Occupational History  . Not on file.   Social History Main Topics  . Smoking status: Never Smoker   . Smokeless tobacco: Not on file  . Alcohol Use: No  . Drug Use: No  . Sexual Activity: Not on file   Other Topics Concern  . Not on file   Social History Narrative     Lab Results  Component Value Date   HGBA1C 7.4* 02/21/2014    Review of Systems: Constitutional:  negative for chills, fever, night sweats, weight changes, or fatigue  HEENT: negative for vision changes, hearing loss, congestion, rhinorrhea, or epistaxis Cardiovascular: negative for chest pain, palpitations, diaphoresis, DOE, orthopnea, or edema Respiratory: negative for hemoptysis, wheezing, shortness of breath, dyspnea, or cough Abdominal: positive for nausea, vomiting, diarrhea Dermatological: negative for rash, erythema, or wounds Neurologic: negative for headache, dizziness, or syncope Renal:  Negative for polyuria,  polydipsia, or dysuria All other systems reviewed and are otherwise negative with the exception to those above and in the HPI.   Physical Exam: Blood pressure 123/73, pulse 80, temperature 98.4 F (36.9 C), resp. rate 16, height 5' 4.5" (1.638 m), weight 176 lb (79.833 kg), SpO2 95 %., Body mass index is 29.75 kg/(m^2). Wt Readings from Last 3 Encounters:  02/24/14 176 lb (79.833 kg)  01/13/14 177 lb 6.4 oz (80.468 kg)  01/06/14 177 lb 3.2 oz (80.377 kg)   BP Readings from Last 3 Encounters:  02/24/14 123/73  01/31/14 106/71  01/13/14 124/84   General: Well developed, well nourished, in no acute distress. Head: Normocephalic, atraumatic, eyes without discharge, sclera non-icteric, nares are without discharge. Bilateral auditory canals clear, TM's are without perforation, pearly grey and translucent with reflective cone of light bilaterally. Oral cavity moist, posterior pharynx without exudate, erythema, peritonsillar abscess, or post nasal drip.  Neck: Supple. No thyromegaly. Full ROM. No lymphadenopathy. Lungs: Clear bilaterally to auscultation without wheezes, rales, or rhonchi. Breathing is unlabored. Heart: RRR with S1 S2. No murmurs, rubs, or gallops appreciated. Abdomen: Soft, non-tender, non-distended with normoactive bowel sounds. No hepatosplenomegaly. No rebound/guarding. No obvious abdominal masses. Msk:  Strength and tone normal for age. Extremities/Skin: Warm and dry. No clubbing or cyanosis. No edema. Annular erythematous lesions on right proximal thigh and right lower quadrant. Monofilament exam  negative.  Neuro: Alert and oriented X 3. Moves all extremities spontaneously. Gait is normal. CNII-XII grossly in tact. Psych:  Responds to questions appropriately with a normal affect.   Labs: Results for orders placed or performed in visit on 02/21/14  Hemoglobin A1c  Result Value Ref Range   Hgb A1c MFr Bld 7.4 (H) 4.6 - 6.5 %  TSH  Result Value Ref Range   TSH 0.23 (L)  0.35 - 4.50 uIU/mL  T4, free  Result Value Ref Range   Free T4 1.47 0.60 - 1.60 ng/dL  Glucose, random  Result Value Ref Range   Glucose, Bld 203 (H) 70 - 99 mg/dL      ASSESSMENT AND PLAN:  80 y.o. year old male with significant side effects from the Victoza.  I have written a prescription for Invokana and asked Daryon to check this with Dr. Dwyane Dee this afternoon to make sure this is reasonable.  Also, the rash is peculiar and may be a tinea corporis.  It could also represent a fixed drug reaction.  In any event, we need to change the Victoza and treat as if it is fungal  Type 2 diabetes mellitus without complication - Plan: canagliflozin (INVOKANA) 100 MG TABS tablet, glipiZIDE (GLIPIZIDE XL) 5 MG 24 hr tablet  Rash and nonspecific skin eruption - Plan: ketoconazole (NIZORAL) 2 % cream     ICD-9-CM ICD-10-CM   1. Type 2 diabetes mellitus without complication 419.37 T02.4 canagliflozin (INVOKANA) 100 MG TABS tablet     glipiZIDE (GLIPIZIDE XL) 5 MG 24 hr tablet  2. Rash and nonspecific skin eruption 782.1 R21 ketoconazole (NIZORAL) 2 % cream     Signed, Robyn Haber, MD 02/24/2014  10:19 AM

## 2014-02-25 MED ORDER — LEVOTHYROXINE SODIUM 137 MCG PO TABS
137.0000 ug | ORAL_TABLET | Freq: Every day | ORAL | Status: DC
Start: 2014-02-25 — End: 2014-06-06

## 2014-02-28 ENCOUNTER — Ambulatory Visit (INDEPENDENT_AMBULATORY_CARE_PROVIDER_SITE_OTHER): Payer: Medicare Other

## 2014-02-28 DIAGNOSIS — E291 Testicular hypofunction: Secondary | ICD-10-CM

## 2014-02-28 NOTE — Progress Notes (Signed)
   Subjective:    Patient ID: Zachary Cook, male    DOB: 1932/07/09, 80 y.o.   MRN: [id number]  HPI Patient here for injection only.   Review of Systems     Objective:   Physical Exam        Assessment & Plan:

## 2014-03-23 ENCOUNTER — Other Ambulatory Visit (INDEPENDENT_AMBULATORY_CARE_PROVIDER_SITE_OTHER): Payer: 59

## 2014-03-23 DIAGNOSIS — E1165 Type 2 diabetes mellitus with hyperglycemia: Secondary | ICD-10-CM

## 2014-03-23 DIAGNOSIS — IMO0002 Reserved for concepts with insufficient information to code with codable children: Secondary | ICD-10-CM

## 2014-03-23 LAB — BASIC METABOLIC PANEL
BUN: 28 mg/dL — ABNORMAL HIGH (ref 6–23)
CALCIUM: 9.7 mg/dL (ref 8.4–10.5)
CO2: 25 mEq/L (ref 19–32)
CREATININE: 1.3 mg/dL (ref 0.40–1.50)
Chloride: 105 mEq/L (ref 96–112)
GFR: 56.5 mL/min — ABNORMAL LOW (ref >60.00)
Glucose, Bld: 231 mg/dL — ABNORMAL HIGH (ref 70–99)
Potassium: 4.5 mEq/L (ref 3.5–5.1)
SODIUM: 138 meq/L (ref 135–145)

## 2014-03-23 LAB — URINALYSIS, ROUTINE W REFLEX MICROSCOPIC
Bilirubin Urine: NEGATIVE
HGB URINE DIPSTICK: NEGATIVE
Ketones, ur: NEGATIVE
Leukocytes, UA: NEGATIVE
Nitrite: NEGATIVE
RBC / HPF: NONE SEEN (ref 0–?)
Specific Gravity, Urine: 1.015 (ref 1.000–1.030)
Total Protein, Urine: NEGATIVE
Urobilinogen, UA: 0.2 (ref 0.0–1.0)
pH: 5.5 (ref 5.0–8.0)

## 2014-03-23 LAB — MICROALBUMIN / CREATININE URINE RATIO
Creatinine,U: 82.2 mg/dL
Microalb Creat Ratio: 1.3 mg/g (ref 0.0–30.0)
Microalb, Ur: 1.1 mg/dL (ref 0.0–1.9)

## 2014-03-25 LAB — FRUCTOSAMINE: Fructosamine: 309 umol/L — ABNORMAL HIGH (ref 190–270)

## 2014-03-28 ENCOUNTER — Encounter: Payer: Self-pay | Admitting: Endocrinology

## 2014-03-28 ENCOUNTER — Ambulatory Visit (INDEPENDENT_AMBULATORY_CARE_PROVIDER_SITE_OTHER): Payer: 59 | Admitting: Endocrinology

## 2014-03-28 ENCOUNTER — Ambulatory Visit (INDEPENDENT_AMBULATORY_CARE_PROVIDER_SITE_OTHER): Payer: 59 | Admitting: Family Medicine

## 2014-03-28 VITALS — BP 126/84 | HR 80 | Temp 98.2°F | Resp 14 | Ht 64.5 in | Wt 174.0 lb

## 2014-03-28 DIAGNOSIS — IMO0002 Reserved for concepts with insufficient information to code with codable children: Secondary | ICD-10-CM

## 2014-03-28 DIAGNOSIS — E291 Testicular hypofunction: Secondary | ICD-10-CM

## 2014-03-28 DIAGNOSIS — E1165 Type 2 diabetes mellitus with hyperglycemia: Secondary | ICD-10-CM

## 2014-03-28 DIAGNOSIS — I10 Essential (primary) hypertension: Secondary | ICD-10-CM

## 2014-03-28 DIAGNOSIS — E038 Other specified hypothyroidism: Secondary | ICD-10-CM

## 2014-03-28 MED ORDER — LIRAGLUTIDE 18 MG/3ML ~~LOC~~ SOPN: 1.2000 mg | PEN_INJECTOR | Freq: Every day | SUBCUTANEOUS | Status: DC

## 2014-03-28 MED ORDER — INSULIN PEN NEEDLE 31G X 5 MM MISC: Status: DC

## 2014-03-28 NOTE — Progress Notes (Signed)
   Subjective:    Patient ID: Zachary Cook, male    DOB: [birth year], 81 y.o.   MRN: [id number]  HPI  Here for Testosterone inj only  Review of Systems     Objective:   Physical Exam        Assessment & Plan:

## 2014-03-28 NOTE — Patient Instructions (Signed)
Alternate am and a 2 hour after any meal glucose checks  Walk daily  Victoza 0.6mg  daily and may go up 2-5 clicks if toleared  Call if sugars not better or having nausea in 2 weeks  Glipizide 2 daily until sugar drops to 100 then go to 1 daily

## 2014-03-28 NOTE — Progress Notes (Signed)
Patient ID: Zachary Cook, male   DOB: May 29, 1932, 81 y.o.   MRN: [id number]           Reason for Appointment: Follow-Up for Type 2 Diabetes  Referring physician: Lauenstein  History of Present Illness:          Diagnosis: Type 2 diabetes mellitus, date of diagnosis: 2005       Past history: He was started on metformin when he was found to have diabetes on routine lab work. Details of this are not available He thinks his blood sugars had been fairly well controlled for several years with metformin alone but no records are available He has had a couple of different physicians and has not always been followed consistently for his diabetes  Blood sugars had been significantly higher in the 4 months prior to his consultation in 10/15 and his A1c had gone up to 12% with his regimen of glipizide and metformin  Recent history:   He was tried on Victoza since 10/15 and he had titrated this to 1.2 mg. However subsequently he had significant nausea, occasional vomiting and some diarrhea and this was stopped Blood sugars had improved significantly and his glipizide was reduced to once a day only Because of the GI side effects he was switched to [place] in 12/15 and his metformin and glipizide were continued unchanged However his blood sugars are significantly higher and averaging nearly 200 in the mornings This is despite losing 3 pounds over the holidays He has not checked his blood sugars after meals usually as directed as he is confused about the instructions Also has not been exercising on the treadmill because of his treadmill not working He is usually eating 2 meals a day and his blood sugar was 287 yesterday afternoon after a sandwich       Oral hypoglycemic drugs the patient is taking are: Metformin 1 g twice a day, glipizide ER 5 mg once a day    Side effects from medications have been:  nausea from Victoza   Glucose monitoring:  done 0.5 times a day         Glucometer: Accu-Chek        Blood Glucose readings  recently  PRE-MEAL Breakfast Lunch Dinner Bedtime Overall  Glucose range:  171-245    287     Mean/median:  185    188    Self-care: The diet that the patient has been following is: None, usually low fat      Water, no regular soft drinks Meals: 2 meals per day. Breakfast is usually none; bran flakes; fried food 1/7 days a week           Exercise:  not walking  treadmill 15 min, 3/7         Dietician visit, most recent: None.               Weight history: Previous range  175-205  Wt Readings from Last 3 Encounters:  03/28/14 174 lb (78.926 kg)  02/24/14 177 lb 9.6 oz (80.559 kg)  02/24/14 176 lb (79.833 kg)    Glycemic control:   Lab Results  Component Value Date   HGBA1C 7.4* 02/21/2014   HGBA1C 12.6 12/02/2013   HGBA1C 7.3 10/18/2012   Lab Results  Component Value Date   MICROALBUR 1.1 03/23/2014   LDLCALC 85 12/02/2013   CREATININE 1.30 03/23/2014         Medication List       This list is  accurate as of: 03/28/14  1:28 PM.  Always use your most recent med list.               ACCU-CHEK NANO SMARTVIEW W/DEVICE Kit     ACCU-CHEK SMARTVIEW test strip  Generic drug:  glucose blood     amLODipine 5 MG tablet  Commonly known as:  NORVASC  TAKE 1 TABLET BY MOUTH DAILY     aspirin 81 MG tablet  Take 81 mg by mouth daily.     canagliflozin 100 MG Tabs tablet  Commonly known as:  INVOKANA  Take 1 tablet (100 mg total) by mouth daily.     esomeprazole 40 MG capsule  Commonly known as:  NEXIUM  Take 1 capsule (40 mg total) by mouth daily.     glipiZIDE 5 MG 24 hr tablet  Commonly known as:  GLIPIZIDE XL  Take 1 tablet (5 mg total) by mouth daily with breakfast.     Insulin Pen Needle 31G X 5 MM Misc  Use one per day with Victoza     ketoconazole 2 % cream  Commonly known as:  NIZORAL  Apply 1 application topically daily.     levothyroxine 137 MCG tablet  Commonly known as:  SYNTHROID  Take 1 tablet (137 mcg total) by  mouth daily before breakfast.     lisinopril 10 MG tablet  Commonly known as:  PRINIVIL,ZESTRIL  TAKE 1 TABLET BY MOUTH DAILY     metFORMIN 1000 MG tablet  Commonly known as:  GLUCOPHAGE  TAKE 1 TABLET BY MOUTH TWICE DAILY WITH A MEAL     simvastatin 20 MG tablet  Commonly known as:  ZOCOR  TAKE 1 TABLET BY MOUTH AT BEDTIME     tamsulosin 0.4 MG Caps capsule  Commonly known as:  FLOMAX  TAKE ONE CAPSULE BY MOUTH DAILY     testosterone cypionate 200 MG/ML injection  Commonly known as:  DEPOTESTOTERONE CYPIONATE  Inject 1 mL (200 mg total) into the muscle every 28 (twenty-eight) days.     VICTOZA 18 MG/3ML Sopn  Generic drug:  Liraglutide        Allergies: No Known Allergies  Past Medical History  Diagnosis Date  . Diabetes mellitus without complication     Past Surgical History  Procedure Laterality Date  . Vasectomy      Family History  Problem Relation Age of Onset  . Heart disease Mother   . Diabetes Mother   . Cancer Father     Social History:  reports that he has never smoked. He does not have any smokeless tobacco history on file. He reports that he does not drink alcohol or use illicit drugs.    Review of Systems       Vision is normal. Most recent eye exam was 9/15       Lipids: Recent labs as below, currently taking 20 mg Zocor       Lab Results  Component Value Date   CHOL 166 12/02/2013   HDL 42 12/02/2013   LDLCALC 85 12/02/2013   TRIG 196* 12/02/2013   CHOLHDL 4.0 12/02/2013                  Thyroid:  He thinks he was found to have hypothyroidism about the year 2000 on routine labs without symptoms. Apparently he did not  feel any different with taking the thyroid supplement.  His TSH record indicates that it had been previously consistently low In 9/15 his  dose was reduced from 200 g daily to  6 tablets a week but because of continued low TSH and high free T4 he was told to take this 5 days a week.  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 He was switched from the topical preparation to the injections because he was told that the cream causes heart attacks He has been on injections for at least 2 years but No details of his evaluation or follow-up levels are available  No results found for: TESTOSTERONE   LABS:  Appointment on 03/23/2014  Component Date Value Ref Range Status  . Sodium 03/23/2014 138  135 - 145 mEq/L Final  . Potassium 03/23/2014 4.5  3.5 - 5.1 mEq/L Final  . Chloride 03/23/2014 105  96 - 112 mEq/L Final  . CO2 03/23/2014 25  19 - 32 mEq/L Final  . Glucose, Bld 03/23/2014 231* 70 - 99 mg/dL Final  . BUN 03/23/2014 28* 6 - 23 mg/dL Final  . Creatinine, Ser 03/23/2014 1.30  0.40 - 1.50 mg/dL Final  . Calcium 03/23/2014 9.7  8.4 - 10.5 mg/dL Final  . GFR 03/23/2014 56.50* >60.00 mL/min Final  . Fructosamine 03/23/2014 309* 190 - 270 umol/L Final  . Microalb, Ur 03/23/2014 1.1  0.0 - 1.9 mg/dL Final  . Creatinine,U 03/23/2014 82.2   Final  . Microalb Creat Ratio 03/23/2014 1.3  0.0 - 30.0 mg/g Final  . Color, Urine 03/23/2014 YELLOW  Yellow;Lt. Yellow Final  . APPearance 03/23/2014 CLEAR  Clear Final  . Specific Gravity, Urine 03/23/2014 1.015  1.000-1.030 Final  . pH 03/23/2014 5.5  5.0 - 8.0 Final  . Total Protein, Urine 03/23/2014 NEGATIVE  Negative Final  . Urine Glucose 03/23/2014 >=1000* Negative Final  . Ketones, ur 03/23/2014 NEGATIVE  Negative Final  . Bilirubin Urine 03/23/2014 NEGATIVE  Negative Final  . Hgb urine  dipstick 03/23/2014 NEGATIVE  Negative Final  . Urobilinogen, UA 03/23/2014 0.2  0.0 - 1.0 Final  . Leukocytes, UA 03/23/2014 NEGATIVE  Negative Final  . Nitrite 03/23/2014 NEGATIVE  Negative Final  . WBC, UA 03/23/2014 0-2/hpf  0-2/hpf Final  . RBC / HPF 03/23/2014 none seen  0-2/hpf Final  . Squamous Epithelial / LPF 03/23/2014 Rare(0-4/hpf)  Rare(0-4/hpf) Final    Physical Examination:  BP 126/84 mmHg  Pulse 80  Temp(Src) 98.2 F (36.8 C)  Resp 14  Ht 5' 4.5" (1.638 m)  Wt 174 lb (78.926 kg)  BMI 29.42 kg/m2  SpO2 94%      ASSESSMENT:  Diabetes type 2, previously  with significant hyperglycemia on metformin and low-dose glipizide With Victoza  1.2 mg his blood sugars had been much improved but because of GI side effects this was stopped.  Also he is concerned about this drug being expensive With Invokana his blood sugars are significantly higher again However he is checking his blood sugar mostly in the morning and has the highest  reading of 287 His fructosamine is modestly increased at 309; may not have consistent high readings after meals Also he has not been exercising as before  Discussed that he probably does have some degree of insulin deficiency making his control difficult with current regimen He was able to get better controlled with Victoza but also was eating very little because of nausea He may well need basal insulin to get adequate control  PLAN:  He can retry Victoza 0.6 mg as he is not wanting to go on insulin as yet.  If he is able to tolerate this he may increase the dose slightly also as discussed.  Also may use Tanzeum but it does not appear to be covered by his insurance.  This may potentially cause less nausea. He can stop Invokana but for at least 5 days he can go up on his glipizide to 10 mg  If he is unable to get good control may consider adding insulin   Discussed timing and frequency of glucose monitoringAnd given written instructions   Check  TSH on the next visit with current regimen of Synthroid 137 g daily  Restart walking for exercise and he can go to any indoor location during wintertime  Needs  balanced low fat meals, Consider appointment with dietitian  He will call if he had any questions  Follow-up in 3 weeks  Patient Instructions  Alternate am and a 2 hour after any meal glucose checks  Walk daily  Victoza 0.51m daily and may go up 2-5 clicks if toleared  Call if sugars not better or having nausea in 2 weeks  Glipizide 2 daily until sugar drops to 100 then go to 1 daily   Counseling time over 50% of today's 25 minute visit  Shaliah Wann 03/28/2014, 1:28 PM   Note: This office note was prepared with [place]state agent Any transcriptional errors that result from this process are unintentional.

## 2014-04-18 ENCOUNTER — Ambulatory Visit (INDEPENDENT_AMBULATORY_CARE_PROVIDER_SITE_OTHER): Payer: 59

## 2014-04-18 ENCOUNTER — Encounter: Payer: Self-pay | Admitting: Endocrinology

## 2014-04-18 ENCOUNTER — Ambulatory Visit (INDEPENDENT_AMBULATORY_CARE_PROVIDER_SITE_OTHER): Payer: 59 | Admitting: Endocrinology

## 2014-04-18 VITALS — BP 113/73 | HR 72 | Temp 97.9°F | Resp 14 | Ht 64.5 in | Wt 174.8 lb

## 2014-04-18 DIAGNOSIS — E1165 Type 2 diabetes mellitus with hyperglycemia: Secondary | ICD-10-CM

## 2014-04-18 DIAGNOSIS — IMO0002 Reserved for concepts with insufficient information to code with codable children: Secondary | ICD-10-CM

## 2014-04-18 DIAGNOSIS — E038 Other specified hypothyroidism: Secondary | ICD-10-CM

## 2014-04-18 DIAGNOSIS — E291 Testicular hypofunction: Secondary | ICD-10-CM

## 2014-04-18 MED ORDER — TESTOSTERONE CYPIONATE 100 MG/ML IM SOLN
200.0000 mg | INTRAMUSCULAR | Status: DC
  Administered 2014-04-18 – 2014-06-20 (×2): 200 mg via INTRAMUSCULAR

## 2014-04-18 NOTE — Progress Notes (Signed)
Patient ID: Zachary Cook, male   DOB: 01-14-1933, 81 y.o.   MRN: [id number]           Reason for Appointment: Follow-Up for Type 2 Diabetes  Referring physician: Lauenstein  History of Present Illness:          Diagnosis: Type 2 diabetes mellitus, date of diagnosis: 2005       Past history: He was started on metformin when he was found to have diabetes on routine lab work. Details of this are not available He thinks his blood sugars had been fairly well controlled for several years with metformin alone but no records are available He has had a couple of different physicians and has not always been followed consistently for his diabetes  Blood sugars had been significantly higher in the 4 months prior to his consultation in 10/15 and his A1c had gone up to 12% with his regimen of glipizide and metformin  Recent history:   He was tried on Victoza since 10/15 and he had titrated this to 1.2 mg. However subsequently he had significant nausea, occasional vomiting and some diarrhea and this was stopped.  He was given a trial of Invokana but this did not improve his blood sugars Since he had blood sugars as high as 287 he was agreeable to trying to Victoza again at the lower dose and he started this in 1/16. He had no side effects with 0.6 mg Victoza and has increased the dose to 2 more clicks; with 3 clicks he was getting some nausea. With this his blood sugars have improved dramatically and have been as low as 76 today. Although he was told to increase his glipizide to 2 tablets he has gone down to 1 tablet again when the blood sugars came down However he has not done many readings after meals and only some readings in the mornings and early afternoon He has also started doing exercise on his treadmill now and feels better       Oral hypoglycemic drugs the patient is taking are: Metformin 1 g twice a day, glipizide ER 5 mg once a day    Side effects from medications have been:  nausea  from Victoza    Glucose monitoring:  done 0.5 times a day         Glucometer: Accu-Chek       Blood Glucose readings  Recently readings are as follows, average for 4 weeks = 153  PRE-MEAL Breakfast  1-5 PM   7 PM  Bedtime Overall  Glucose range:  76-160   103-163   173     Mean/median:      153     Self-care: The diet that the patient has been following is: None, usually low fat      Water, no regular soft drinks Meals: 2 meals per day. Breakfast is usually none; bran flakes; fried food 1/7 days a week           Exercise:   walking  treadmill 15 min, 3/7         Dietician visit, most recent: None.               Weight history: Previous range  175-205  Wt Readings from Last 3 Encounters:  04/18/14 174 lb 12.8 oz (79.289 kg)  03/28/14 174 lb (78.926 kg)  02/24/14 177 lb 9.6 oz (80.559 kg)    Glycemic control:   Lab Results  Component Value Date   HGBA1C 7.4*  02/21/2014   HGBA1C 12.6 12/02/2013   HGBA1C 7.3 10/18/2012   Lab Results  Component Value Date   MICROALBUR 1.1 03/23/2014   LDLCALC 85 12/02/2013   CREATININE 1.30 03/23/2014         Medication List       This list is accurate as of: 04/18/14 11:59 PM.  Always use your most recent med list.               ACCU-CHEK NANO SMARTVIEW W/DEVICE Kit     ACCU-CHEK SMARTVIEW test strip  Generic drug:  glucose blood     aspirin 81 MG tablet  Take 81 mg by mouth daily.     esomeprazole 40 MG capsule  Commonly known as:  NEXIUM  Take 1 capsule (40 mg total) by mouth daily.     glipiZIDE 5 MG 24 hr tablet  Commonly known as:  GLIPIZIDE XL  Take 1 tablet (5 mg total) by mouth daily with breakfast.     Insulin Pen Needle 31G X 5 MM Misc  Use one per day with Victoza     ketoconazole 2 % cream  Commonly known as:  NIZORAL  Apply 1 application topically daily.     levothyroxine 137 MCG tablet  Commonly known as:  SYNTHROID  Take 1 tablet (137 mcg total) by mouth daily before breakfast.     Liraglutide 18  MG/3ML Sopn  Commonly known as:  VICTOZA  Inject 0.2 mLs (1.2 mg total) into the skin daily.     lisinopril 10 MG tablet  Commonly known as:  PRINIVIL,ZESTRIL  TAKE 1 TABLET BY MOUTH DAILY     metFORMIN 1000 MG tablet  Commonly known as:  GLUCOPHAGE  TAKE 1 TABLET BY MOUTH TWICE DAILY WITH A MEAL     simvastatin 20 MG tablet  Commonly known as:  ZOCOR  TAKE 1 TABLET BY MOUTH AT BEDTIME     tamsulosin 0.4 MG Caps capsule  Commonly known as:  FLOMAX  TAKE ONE CAPSULE BY MOUTH DAILY     testosterone cypionate 200 MG/ML injection  Commonly known as:  DEPOTESTOTERONE CYPIONATE  Inject 1 mL (200 mg total) into the muscle every 28 (twenty-eight) days.        Allergies: No Known Allergies  Past Medical History  Diagnosis Date  . Diabetes mellitus without complication     Past Surgical History  Procedure Laterality Date  . Vasectomy      Family History  Problem Relation Age of Onset  . Heart disease Mother   . Diabetes Mother   . Cancer Father     Social History:  reports that he has never smoked. He does not have any smokeless tobacco history on file. He reports that he does not drink alcohol or use illicit drugs.    Review of Systems       Vision is normal. Most recent eye exam was 9/15       Lipids: Recent labs as below, currently taking 20 mg Zocor       Lab Results  Component Value Date   CHOL 166 12/02/2013   HDL 42 12/02/2013   LDLCALC 85 12/02/2013   TRIG 196* 12/02/2013   CHOLHDL 4.0 12/02/2013                  Thyroid:  He thinks he was found to have hypothyroidism about the year 2000 on routine labs without symptoms. Apparently he did not  feel any different with taking  the thyroid supplement.  His TSH record indicates that it had been previously consistently low In 9/15 his dose was reduced from 200 g daily to  6 tablets a week but because of continued low TSH and high free T4 he was told to take this 5 days a week.  Currently he is on 137 g  once a day He says that he does not feel as weak and has more endurance now Last TSH was as follows:  Lab Results  Component Value Date   TSH 0.23* 02/21/2014   TSH 0.03* 01/10/2014   TSH 0.026* 12/02/2013   FREET4 1.47 02/21/2014   FREET4 1.84* 01/10/2014        The blood pressure has been controlled with a 2 drug regimen including lisinopril which he has continued even with starting Invokana    He has been told to have hypogonadism of unclear etiology for the last few years. He was switched from the topical preparation to the injections because he was told that the cream causes heart attacks He has been on injections for at least 2 years but No details of his evaluation or follow-up levels are available  No results found for: TESTOSTERONE   LABS:  No visits with results within 1 Week(s) from this visit. Latest known visit with results is:  Appointment on 03/23/2014  Component Date Value Ref Range Status  . Sodium 03/23/2014 138  135 - 145 mEq/L Final  . Potassium 03/23/2014 4.5  3.5 - 5.1 mEq/L Final  . Chloride 03/23/2014 105  96 - 112 mEq/L Final  . CO2 03/23/2014 25  19 - 32 mEq/L Final  . Glucose, Bld 03/23/2014 231* 70 - 99 mg/dL Final  . BUN 03/23/2014 28* 6 - 23 mg/dL Final  . Creatinine, Ser 03/23/2014 1.30  0.40 - 1.50 mg/dL Final  . Calcium 03/23/2014 9.7  8.4 - 10.5 mg/dL Final  . GFR 03/23/2014 56.50* >60.00 mL/min Final  . Fructosamine 03/23/2014 309* 190 - 270 umol/L Final  . Microalb, Ur 03/23/2014 1.1  0.0 - 1.9 mg/dL Final  . Creatinine,U 03/23/2014 82.2   Final  . Microalb Creat Ratio 03/23/2014 1.3  0.0 - 30.0 mg/g Final  . Color, Urine 03/23/2014 YELLOW  Yellow;Lt. Yellow Final  . APPearance 03/23/2014 CLEAR  Clear Final  . Specific Gravity, Urine 03/23/2014 1.015  1.000-1.030 Final  . pH 03/23/2014 5.5  5.0 - 8.0 Final  . Total Protein, Urine 03/23/2014 NEGATIVE  Negative Final  . Urine Glucose 03/23/2014 >=1000* Negative Final  . Ketones, ur  03/23/2014 NEGATIVE  Negative Final  . Bilirubin Urine 03/23/2014 NEGATIVE  Negative Final  . Hgb urine dipstick 03/23/2014 NEGATIVE  Negative Final  . Urobilinogen, UA 03/23/2014 0.2  0.0 - 1.0 Final  . Leukocytes, UA 03/23/2014 NEGATIVE  Negative Final  . Nitrite 03/23/2014 NEGATIVE  Negative Final  . WBC, UA 03/23/2014 0-2/hpf  0-2/hpf Final  . RBC / HPF 03/23/2014 none seen  0-2/hpf Final  . Squamous Epithelial / LPF 03/23/2014 Rare(0-4/hpf)  Rare(0-4/hpf) Final    Physical Examination:  BP 113/73 mmHg  Pulse 72  Temp(Src) 97.9 F (36.6 C)  Resp 14  Ht 5' 4.5" (1.638 m)  Wt 174 lb 12.8 oz (79.289 kg)  BMI 29.55 kg/m2  SpO2 96%      ASSESSMENT:  Diabetes type 2, previously  with significant hyperglycemia on metformin and low-dose glipizide With Victoza low-dose his blood sugars have again improved significantly and he is tolerating a dose just over  0.6 mg. Also his dose of glipizide ER is now only 5 mg. He does however need to check blood sugars after meals as he has done readings mostly in the mornings and early afternoon Currently he has been exercising on the treadmill and this will help continue improving his control l  PLAN:   He can continue Victoza 0.6 mg +2 clicks and try to increase by 1 click every week if able to  Check TSH on the next visit with current regimen of Synthroid 137 g daily  No change in glipizide or metformin   Patient Instructions  Please check blood sugars at least half the time about 2 hours after any meal and 3 times per week on waking up. Please bring blood sugar monitor to each visit. Recommended blood sugar levels about 2 hours after meal is 140-180 and on waking up [phone]  Go up 1 click every 4-5 days until nausea occurs as long as sugars in target    [facility] 04/19/2014, 10:59 AM   Note: This office note was prepared with Estate agent. Any transcriptional errors that result from this process are  unintentional.

## 2014-04-18 NOTE — Progress Notes (Signed)
   Subjective:    Patient ID: Zachary Cook, male    DOB: [birth year], 81 y.o.   MRN: [id number]  HPI  Patient here for testosterone injection only.   Review of Systems     Objective:   Physical Exam        Assessment & Plan:

## 2014-04-18 NOTE — Patient Instructions (Signed)
Please check blood sugars at least half the time about 2 hours after any meal and 3 times per week on waking up. Please bring blood sugar monitor to each visit. Recommended blood sugar levels about 2 hours after meal is 140-180 and on waking up [phone]  Go up 1 click every 4-5 days until nausea occurs as long as sugars in target

## 2014-05-08 ENCOUNTER — Other Ambulatory Visit: Payer: Self-pay | Admitting: Physician Assistant

## 2014-05-18 ENCOUNTER — Telehealth: Payer: Self-pay

## 2014-05-18 ENCOUNTER — Other Ambulatory Visit: Payer: Self-pay | Admitting: Family Medicine

## 2014-05-18 ENCOUNTER — Other Ambulatory Visit: Payer: Self-pay | Admitting: Physician Assistant

## 2014-05-18 NOTE — Telephone Encounter (Signed)
His Testosterone Rx looks weird, did you send this in previously? Can I call it in?     testosterone cypionate (DEPOTESTOTERONE CYPIONATE) injection 200 mg  [[id number]]     Ordered Dose: 200 mg Route: Intramuscular Frequency: Every 30 days    Administration Dose: 200 mg       Start: 04/18/14 1200 End: --          Admin Instructions:   ** FOR IM USE ONLY **         Diagnosis Association: Hypogonadism in male (69.2)               Order Status: Active    Ordering User: Shirl Harris Ordering Date/Time: Mon Apr 18, 2014 1155    Ordering Provider: Robyn Haber, MD Authorizing Provider: Robyn Haber, MD

## 2014-05-18 NOTE — Telephone Encounter (Signed)
Dr L, when do you want pt to f/up? Do you want to OK RFs, or do you want Dr Dwyane Dee to manage DM meds? I see that you have been Rxing them.

## 2014-05-18 NOTE — Telephone Encounter (Signed)
I just want to continue his current dose

## 2014-05-18 NOTE — Telephone Encounter (Signed)
The patient came into the office at 104 to request refill of Metformin and Testosterone.  The patient states that he has been without metformin for several days.  The patient may be reached at [phone].  The patient request that the medication be called into his pharmacy on file.  I am marking this high priority due to fact patient is out of his medication.

## 2014-05-19 MED ORDER — TESTOSTERONE CYPIONATE 200 MG/ML IM SOLN: INTRAMUSCULAR | Status: DC

## 2014-05-19 NOTE — Telephone Encounter (Signed)
Phoned in Rx for testosterone. Called pt to let him know.

## 2014-05-23 ENCOUNTER — Ambulatory Visit (INDEPENDENT_AMBULATORY_CARE_PROVIDER_SITE_OTHER): Payer: 59 | Admitting: *Deleted

## 2014-05-23 ENCOUNTER — Encounter: Payer: Self-pay | Admitting: *Deleted

## 2014-05-23 VITALS — BP 110/72

## 2014-05-23 DIAGNOSIS — E291 Testicular hypofunction: Secondary | ICD-10-CM

## 2014-05-23 MED ORDER — TESTOSTERONE CYPIONATE 200 MG/ML IM SOLN
200.0000 mg | Freq: Once | INTRAMUSCULAR | Status: AC
  Administered 2014-05-23: 200 mg via INTRAMUSCULAR

## 2014-05-23 NOTE — Progress Notes (Signed)
   Subjective:    Patient ID: Zachary Cook, male    DOB: [birth year], 81 y.o.   MRN: [id number]  HPI     Testosterone injection only    Review of Systems     Objective:   Physical Exam        Assessment & Plan:

## 2014-06-06 ENCOUNTER — Telehealth: Payer: Self-pay | Admitting: Endocrinology

## 2014-06-06 ENCOUNTER — Other Ambulatory Visit: Payer: Self-pay | Admitting: *Deleted

## 2014-06-06 DIAGNOSIS — E119 Type 2 diabetes mellitus without complications: Secondary | ICD-10-CM

## 2014-06-06 MED ORDER — GLIPIZIDE ER 5 MG PO TB24: 5.0000 mg | ORAL_TABLET | Freq: Every day | ORAL | Status: DC

## 2014-06-06 MED ORDER — LEVOTHYROXINE SODIUM 137 MCG PO TABS: 137.0000 ug | ORAL_TABLET | Freq: Every day | ORAL | Status: DC

## 2014-06-06 NOTE — Telephone Encounter (Signed)
rx sent

## 2014-06-06 NOTE — Telephone Encounter (Signed)
Pt needs refill on glipizide 5mg , levaothyroxine 0.137mg  cal into [facility]

## 2014-06-07 ENCOUNTER — Other Ambulatory Visit: Payer: Self-pay | Admitting: Family Medicine

## 2014-06-08 NOTE — Telephone Encounter (Signed)
Dr L, you saw pt in Dec for check up but don't see this med discussed recently. OK to RF?

## 2014-06-14 ENCOUNTER — Encounter: Payer: Self-pay | Admitting: *Deleted

## 2014-06-14 ENCOUNTER — Other Ambulatory Visit (INDEPENDENT_AMBULATORY_CARE_PROVIDER_SITE_OTHER): Payer: 59

## 2014-06-14 DIAGNOSIS — E038 Other specified hypothyroidism: Secondary | ICD-10-CM

## 2014-06-14 DIAGNOSIS — IMO0002 Reserved for concepts with insufficient information to code with codable children: Secondary | ICD-10-CM

## 2014-06-14 DIAGNOSIS — E1165 Type 2 diabetes mellitus with hyperglycemia: Secondary | ICD-10-CM

## 2014-06-14 LAB — BASIC METABOLIC PANEL
BUN: 32 mg/dL — AB (ref 6–23)
CALCIUM: 9.8 mg/dL (ref 8.4–10.5)
CO2: 24 meq/L (ref 19–32)
Chloride: 104 mEq/L (ref 96–112)
Creatinine, Ser: 1.47 mg/dL (ref 0.40–1.50)
GFR: 49.01 mL/min — AB (ref >60.00)
Glucose, Bld: 150 mg/dL — ABNORMAL HIGH (ref 70–99)
Potassium: 5 mEq/L (ref 3.5–5.1)
Sodium: 136 mEq/L (ref 135–145)

## 2014-06-14 LAB — T4, FREE: Free T4: 0.99 ng/dL (ref 0.60–1.60)

## 2014-06-14 LAB — HEMOGLOBIN A1C: HEMOGLOBIN A1C: 7.1 % — AB (ref 4.6–6.5)

## 2014-06-14 LAB — TSH: TSH: 2.63 u[IU]/mL (ref 0.35–4.50)

## 2014-06-17 ENCOUNTER — Ambulatory Visit (INDEPENDENT_AMBULATORY_CARE_PROVIDER_SITE_OTHER): Payer: 59 | Admitting: Endocrinology

## 2014-06-17 ENCOUNTER — Encounter: Payer: Self-pay | Admitting: Endocrinology

## 2014-06-17 VITALS — BP 98/68 | HR 77 | Temp 97.8°F | Wt 175.0 lb

## 2014-06-17 DIAGNOSIS — I952 Hypotension due to drugs: Secondary | ICD-10-CM

## 2014-06-17 DIAGNOSIS — E1165 Type 2 diabetes mellitus with hyperglycemia: Secondary | ICD-10-CM | POA: Diagnosis not present

## 2014-06-17 DIAGNOSIS — E038 Other specified hypothyroidism: Secondary | ICD-10-CM

## 2014-06-17 DIAGNOSIS — N289 Disorder of kidney and ureter, unspecified: Secondary | ICD-10-CM | POA: Diagnosis not present

## 2014-06-17 DIAGNOSIS — IMO0002 Reserved for concepts with insufficient information to code with codable children: Secondary | ICD-10-CM

## 2014-06-17 MED ORDER — GLIMEPIRIDE 2 MG PO TABS: 2.0000 mg | ORAL_TABLET | Freq: Every day | ORAL | Status: DC

## 2014-06-17 NOTE — Progress Notes (Signed)
Patient ID: Zachary Cook, male   DOB: 10-23-1932, 81 y.o.   MRN: [id number]           Reason for Appointment: Follow-Up for Type 2 Diabetes  Referring physician: Lauenstein  History of Present Illness:          Diagnosis: Type 2 diabetes mellitus, date of diagnosis: 2005       Past history: He was started on metformin when he was found to have diabetes on routine lab work. Details of this are not available He thinks his blood sugars had been fairly well controlled for several years with metformin alone but no records are available He has had a couple of different physicians and has not always been followed consistently for his diabetes  Blood sugars had been significantly higher in the 4 months prior to his consultation in 10/15 and his A1c had gone up to 12% with his regimen of glipizide and metformin  Recent history:  4/16 He was tried on Victoza since 10/15 and he had titrated this to 1.2 mg. However subsequently he had significant nausea, occasional vomiting and some diarrhea and this was stopped.  He was given a trial of Invokana but this did not improve his blood sugars Since he had blood sugars as high as 287 he was agreeable to trying to Victoza again at the lower dose and he started this in 1/16.  Initially he was able to increase the dose without side effects but subsequently had significant nausea and the dose had to be reduced Have since his last visit he has gradually increased the dose beyond the 0.6 mg with adding 2 more clicks every few days He had no side effects with 1.2 mg Victoza now  With this his blood sugars have continued to do well although maybe slightly higher than on his last visit However he has not done many readings after meals and only some readings in the late mornings and early afternoon A1c is improved but not below 7% Currently taking only 5 mg of glipizide ER No symptoms of hypoglycemia except if he is late for his meals during the day Usually  not eating breakfast  He has also continued doing exercise on his treadmill now and feels better       Oral hypoglycemic drugs the patient is taking are: Metformin 1 g twice a day, glipizide ER 5 mg once a day    Side effects from medications have been:  nausea from Victoza    Glucose monitoring:  done 0.5 times a day         Glucometer: Accu-Chek       Blood Glucose readings  Recently readings are checked mostly in the mornings  PRE-MEAL Breakfast Lunch Dinner Bedtime Overall  Glucose range:  114-185   109, 127   108   93-185   Mean/median:      134     Self-care: The diet that the patient has been following is: None, usually low fat      Water, no regular soft drinks Meals: 2 meals per day. Breakfast is usually none; bran flakes; fried food 1/7 days a week           Exercise:   walking  treadmill 15 min, 3/7         Dietician visit, most recent: None.               Weight history: Previous range  175-205  Wt Readings from Last 3 Encounters:  06/17/14 175 lb (79.379 kg)  04/18/14 174 lb 12.8 oz (79.289 kg)  03/28/14 174 lb (78.926 kg)    Glycemic control:   Lab Results  Component Value Date   HGBA1C 7.1* 06/14/2014   HGBA1C 7.4* 02/21/2014   HGBA1C 12.6 12/02/2013   Lab Results  Component Value Date   MICROALBUR 1.1 03/23/2014   LDLCALC 85 12/02/2013   CREATININE 1.47 06/14/2014    Lab on 06/14/2014  Component Date Value Ref Range Status  . Hgb A1c MFr Bld 06/14/2014 7.1* 4.6 - 6.5 % Final   Glycemic Control Guidelines for People with Diabetes:Non Diabetic:  <6%Goal of Therapy: <7%Additional Action Suggested:  >8%   . Sodium 06/14/2014 136  135 - 145 mEq/L Final  . Potassium 06/14/2014 5.0  3.5 - 5.1 mEq/L Final  . Chloride 06/14/2014 104  96 - 112 mEq/L Final  . CO2 06/14/2014 24  19 - 32 mEq/L Final  . Glucose, Bld 06/14/2014 150* 70 - 99 mg/dL Final  . BUN 06/14/2014 32* 6 - 23 mg/dL Final  . Creatinine, Ser 06/14/2014 1.47  0.40 - 1.50 mg/dL Final  .  Calcium 06/14/2014 9.8  8.4 - 10.5 mg/dL Final  . GFR 06/14/2014 49.01* >60.00 mL/min Final  . TSH 06/14/2014 2.63  0.35 - 4.50 uIU/mL Final  . Free T4 06/14/2014 0.99  0.60 - 1.60 ng/dL Final        Medication List       This list is accurate as of: 06/17/14  2:10 PM.  Always use your most recent med list.               ACCU-CHEK NANO SMARTVIEW W/DEVICE Kit     ACCU-CHEK SMARTVIEW test strip  Generic drug:  glucose blood     amLODipine 5 MG tablet  Commonly known as:  NORVASC     aspirin 81 MG tablet  Take 81 mg by mouth daily.     esomeprazole 40 MG capsule  Commonly known as:  NEXIUM  TAKE ONE CAPSULE BY MOUTH DAILY     glimepiride 2 MG tablet  Commonly known as:  AMARYL  Take 1 tablet (2 mg total) by mouth daily before supper.     Insulin Pen Needle 31G X 5 MM Misc  Use one per day with Victoza     ketoconazole 2 % cream  Commonly known as:  NIZORAL  Apply 1 application topically daily.     levothyroxine 137 MCG tablet  Commonly known as:  SYNTHROID  Take 1 tablet (137 mcg total) by mouth daily before breakfast.     Liraglutide 18 MG/3ML Sopn  Commonly known as:  VICTOZA  Inject 0.2 mLs (1.2 mg total) into the skin daily.     lisinopril 10 MG tablet  Commonly known as:  PRINIVIL,ZESTRIL  TAKE 1 TABLET BY MOUTH DAILY     metFORMIN 1000 MG tablet  Commonly known as:  GLUCOPHAGE  TAKE 1 TABLET BY MOUTH TWICE DAILY WITH A MEAL     simvastatin 20 MG tablet  Commonly known as:  ZOCOR  TAKE 1 TABLET BY MOUTH AT BEDTIME     tamsulosin 0.4 MG Caps capsule  Commonly known as:  FLOMAX  TAKE ONE CAPSULE BY MOUTH DAILY     testosterone cypionate 200 MG/ML injection  Commonly known as:  DEPOTESTOTERONE CYPIONATE  USE AS DIRECTED EVERY MONTH        Allergies: No Known Allergies  Past Medical History  Diagnosis Date  .  Diabetes mellitus without complication     Past Surgical History  Procedure Laterality Date  . Vasectomy      Family History    Problem Relation Age of Onset  . Heart disease Mother   . Diabetes Mother   . Cancer Father     Social History:  reports that he has never smoked. He does not have any smokeless tobacco history on file. He reports that he does not drink alcohol or use illicit drugs.    Review of Systems   Most recent eye exam was 9/15       Lipids: Most recent labs as below, currently taking 20 mg Zocor       Lab Results  Component Value Date   CHOL 166 12/02/2013   HDL 42 12/02/2013   LDLCALC 85 12/02/2013   TRIG 196* 12/02/2013   CHOLHDL 4.0 12/02/2013                  Thyroid:  He thinks he was found to have hypothyroidism about the year 2000 on routine labs without symptoms. Apparently he did not  feel any different with taking the thyroid supplement.  His TSH record indicates that it had been previously consistently low before his consultation here  His dose had been progressively reduced and now he is on 137 g per day. TSH is now back to normal He says that he does not feel as weak and has more endurance now, no heat or cold intolerance Last TSH was as follows:  Lab Results  Component Value Date   TSH 2.63 06/14/2014   TSH 0.23* 02/21/2014   TSH 0.03* 01/10/2014   FREET4 0.99 06/14/2014   FREET4 1.47 02/21/2014   FREET4 1.84* 01/10/2014        The blood pressure has been controlled with a 2 drug regimen including lisinopril  He does not monitor much at home No lightheadedness but blood pressure appears to be relatively lower today    He has been told to have hypogonadism of unclear etiology for the last few years. He was switched from the topical preparation to the injections because he was told that the cream causes heart attacks He has been followed by PCP He has been on injections for at least 2 years but No details of his evaluation or follow-up levels are available  No results found for: TESTOSTERONE   LABS:  Lab on 06/14/2014  Component Date Value Ref Range  Status  . Hgb A1c MFr Bld 06/14/2014 7.1* 4.6 - 6.5 % Final   Glycemic Control Guidelines for People with Diabetes:Non Diabetic:  <6%Goal of Therapy: <7%Additional Action Suggested:  >8%   . Sodium 06/14/2014 136  135 - 145 mEq/L Final  . Potassium 06/14/2014 5.0  3.5 - 5.1 mEq/L Final  . Chloride 06/14/2014 104  96 - 112 mEq/L Final  . CO2 06/14/2014 24  19 - 32 mEq/L Final  . Glucose, Bld 06/14/2014 150* 70 - 99 mg/dL Final  . BUN 06/14/2014 32* 6 - 23 mg/dL Final  . Creatinine, Ser 06/14/2014 1.47  0.40 - 1.50 mg/dL Final  . Calcium 06/14/2014 9.8  8.4 - 10.5 mg/dL Final  . GFR 06/14/2014 49.01* >60.00 mL/min Final  . TSH 06/14/2014 2.63  0.35 - 4.50 uIU/mL Final  . Free T4 06/14/2014 0.99  0.60 - 1.60 ng/dL Final    Physical Examination:  BP 98/68 mmHg  Pulse 77  Temp(Src) 97.8 F (36.6 C)  Wt 175 lb (79.379 kg)  SpO2 98%  Standing blood pressure 98/68.  Sitting blood pressure was 105/70 No pedal edema    ASSESSMENT:  Diabetes type 2, previously  with significant hyperglycemia on metformin and low-dose glipizide With starting Victoza he has benefited significantly with markedly improved blood sugars overall He is at 1.2 mg doses now and over the last several weeks has been able to gradually get use to the higher dose by gradually increasing the dose between 0.6 and 1.2 mg.  No nausea at this time and he feels comfortable doing this daily He is a little concerned about his fasting readings being high although these are not consistent However not clear what his readings are after supper recently Since he may tend to feel hypoglycemic with being late for his meals during the daytime will not increase his glipizide Again he has been exercising on the treadmill and this will help continue improving his control  HYPERTENSION: Appears to be getting lower blood pressure readings possibly from improved diabetes control, regular exercise and regulation of his thyroid levels Also  appears to be getting high normal potassium levels and mild renal dysfunction probably from reduce renal blood flow  Since he is significantly orthostatic he does not need current doses of amlodipine and lisinopril  HYPOTHYROID: TSH levels now Very consistent now with 137 Synthroid and he is compliant with this Subjectively feels fairly good   PLAN:   He can continue Victoza 1.2 mg  Trial of Amaryl 2 mg daily at suppertime instead of glipizide ER and he can call if blood sugar is not consistently controlled, may be able to increase or decreased the dose accordingly also.  Discussed action of Amaryl and differences between this and glipizide ER and timing of medication.  Discussed possibility of hypoglycemia  Check blood sugars after supper and periodically after lunch also.  Discussed postprandial blood sugar targets  Continue same dose of Synthroid 137  Continue metformin ER   Reduce amlodipine to half tablet daily and follow-up with PCP for blood pressure, continue lisinopril  Patient Instructions  Please check blood sugars at least half the time about 2 hours after any meal and 3 times per week on waking up.  Please bring blood sugar monitor to each visit.  Recommended blood sugar levels about 2 hours after meal is 140-180 and on waking up 90-130  Change Glipizide to Glimeperide $RemoveBefore'2mg'[id number]$  daily at supper daily and call if sugar not in range  Reduce Amlodipine to 1/2 tab daily     total visit time including management of multiple problems, review of labs, counseling = 25 minutes   Marco Adelson 06/17/2014, 2:10 PM   Note: This office note was prepared with Estate agent. Any transcriptional errors that result from this process are unintentional.

## 2014-06-17 NOTE — Progress Notes (Signed)
Pre visit review using our clinic review tool, if applicable. No additional management support is needed unless otherwise documented below in the visit note. 

## 2014-06-17 NOTE — Patient Instructions (Addendum)
Please check blood sugars at least half the time about 2 hours after any meal and 3 times per week on waking up.  Please bring blood sugar monitor to each visit.  Recommended blood sugar levels about 2 hours after meal is 140-180 and on waking up 90-130  Change Glipizide to Glimeperide 2mg  daily at supper daily and call if sugar not in range  Reduce Amlodipine to 1/2 tab daily

## 2014-06-20 ENCOUNTER — Ambulatory Visit (INDEPENDENT_AMBULATORY_CARE_PROVIDER_SITE_OTHER): Payer: 59

## 2014-06-20 DIAGNOSIS — E291 Testicular hypofunction: Secondary | ICD-10-CM | POA: Diagnosis not present

## 2014-06-20 NOTE — Progress Notes (Signed)
   Subjective:    Patient ID: Zachary Cook, male    DOB: 03-29-1932, 81 y.o.   MRN: [id number]  HPI Patient here for testosterone injection only.    Review of Systems     Objective:   Physical Exam        Assessment & Plan:

## 2014-06-30 ENCOUNTER — Ambulatory Visit (INDEPENDENT_AMBULATORY_CARE_PROVIDER_SITE_OTHER): Payer: 59 | Admitting: Family Medicine

## 2014-06-30 ENCOUNTER — Encounter: Payer: Self-pay | Admitting: Family Medicine

## 2014-06-30 VITALS — BP 104/39 | HR 87 | Temp 98.4°F | Resp 16 | Ht 64.5 in | Wt 175.0 lb

## 2014-06-30 DIAGNOSIS — I10 Essential (primary) hypertension: Secondary | ICD-10-CM

## 2014-06-30 DIAGNOSIS — E119 Type 2 diabetes mellitus without complications: Secondary | ICD-10-CM

## 2014-06-30 NOTE — Patient Instructions (Signed)
Stop the Amlodipine (Norvasc)

## 2014-06-30 NOTE — Progress Notes (Signed)
Subjective:  This chart was scribed for Robyn Haber, MD by Randa Evens, ED Scribe. This patient was seen in room 25 and the patient's care was started at 12:05 PM.   Patient ID: Zachary Cook, male    DOB: 15-Dec-1932, 81 y.o.   MRN: [id number]  Chief Complaint  Patient presents with   Follow-up   low blood pressure    HPI HPI Comments: Zachary Cook is a 81 y.o. male who presents to the [facility] for follow up for possible hypotension. Pt reports 1 episode of light headedness but states that he was working out with his son on the roof. Pt states that he has been compliant with all his medications. Pt states that his blood sugars are well controlled. Pt doesn't report any other symptoms. Pt states that he feels good. Pt states that he has been exercising on the treadmill more frequently to lose weight. Pt states that he is trying to reach 155 pounds.  Blood Pressure reading :  06/19/14 - 105/77 06/20/14 - 106/75 06/21/14 - 99/74 06/22/14 - 97/76 06/24/14 - 103/71 06/26/14 - 105/64 06/27/14 - 100/74 06/29/14 - 114/75  Blood Sugar reading:  06/14/14 - 121 06/15/14 - 144 06/16/14 - 167 06/17/14 - 164 06/19/14 - 141 06/20/14 - 132 06/21/14 - 113 06/22/14 - 168 (after eating) 06/24/14 - 140 06/26/14 - 99 06/28/14 - 107 06/29/14 - 161  Patient Active Problem List   Diagnosis Date Noted   Adult onset hypothyroidism 12/20/2013   Type II diabetes mellitus, uncontrolled 12/20/2013   Hypogonadism in male 12/02/2013   Type 2 diabetes mellitus 05/09/2011   Hypertension 05/09/2011   GERD (gastroesophageal reflux disease) 05/09/2011   Hypogonadism male 05/09/2011   Past Medical History  Diagnosis Date   Diabetes mellitus without complication    Past Surgical History  Procedure Laterality Date   Vasectomy     No Known Allergies Prior to Admission medications   Medication Sig Start Date End Date Taking? Authorizing Provider  ACCU-CHEK SMARTVIEW  test strip  12/02/13  Yes Historical Provider, MD  amLODipine (NORVASC) 5 MG tablet  04/04/14  Yes Historical Provider, MD  aspirin 81 MG tablet Take 81 mg by mouth daily.   Yes Historical Provider, MD  Blood Glucose Monitoring Suppl (ACCU-CHEK NANO SMARTVIEW) W/DEVICE KIT  12/02/13  Yes Historical Provider, MD  esomeprazole (NEXIUM) 40 MG capsule TAKE ONE CAPSULE BY MOUTH DAILY 06/08/14  Yes Robyn Haber, MD  glimepiride (AMARYL) 2 MG tablet Take 1 tablet (2 mg total) by mouth daily before supper. 06/17/14  Yes Elayne Snare, MD  Insulin Pen Needle 31G X 5 MM MISC Use one per day with Victoza 03/28/14  Yes Elayne Snare, MD  ketoconazole (NIZORAL) 2 % cream Apply 1 application topically daily. 02/24/14  Yes Robyn Haber, MD  levothyroxine (SYNTHROID) 137 MCG tablet Take 1 tablet (137 mcg total) by mouth daily before breakfast. 06/06/14  Yes Elayne Snare, MD  Liraglutide (VICTOZA) 18 MG/3ML SOPN Inject 0.2 mLs (1.2 mg total) into the skin daily. 03/28/14  Yes Elayne Snare, MD  lisinopril (PRINIVIL,ZESTRIL) 10 MG tablet TAKE 1 TABLET BY MOUTH DAILY 12/02/13  Yes Robyn Haber, MD  metFORMIN (GLUCOPHAGE) 1000 MG tablet TAKE 1 TABLET BY MOUTH TWICE DAILY WITH A MEAL 05/18/14  Yes Robyn Haber, MD  simvastatin (ZOCOR) 20 MG tablet TAKE 1 TABLET BY MOUTH AT BEDTIME 12/02/13  Yes Robyn Haber, MD  tamsulosin (FLOMAX) 0.4 MG CAPS capsule TAKE ONE CAPSULE  BY MOUTH DAILY 12/02/13  Yes Robyn Haber, MD  testosterone cypionate (DEPOTESTOTERONE CYPIONATE) 200 MG/ML injection USE AS DIRECTED EVERY MONTH 05/19/14  Yes Robyn Haber, MD   History   Social History   Marital Status: Married    Spouse Name: N/A   Number of Children: N/A   Years of Education: N/A   Occupational History   Not on file.   Social History Main Topics   Smoking status: Never Smoker    Smokeless tobacco: Not on file   Alcohol Use: No   Drug Use: No   Sexual Activity: Not on file   Other Topics Concern   Not on file    Social History Narrative     Review of Systems  Constitutional: Negative for fever and chills.  HENT: Negative for rhinorrhea and sore throat.   Eyes: Negative for visual disturbance.  Respiratory: Negative for cough and shortness of breath.   Cardiovascular: Negative for chest pain.  Gastrointestinal: Negative for nausea, vomiting, abdominal pain and diarrhea.  Genitourinary: Negative for dysuria.  Musculoskeletal: Negative for back pain and neck pain.  Skin: Negative for rash.  Neurological: Positive for light-headedness. Negative for headaches.  Psychiatric/Behavioral: Negative for confusion.     Objective:   BP 104/39 mmHg   Pulse 87   Temp(Src) 98.4 F (36.9 C)   Resp 16   Ht 5' 4.5" (1.638 m)   Wt 175 lb (79.379 kg)   BMI 29.59 kg/m2   SpO2 97%   Physical Exam  Constitutional: He is oriented to person, place, and time. He appears well-developed and well-nourished. No distress.  HENT:  Head: Normocephalic and atraumatic.  Eyes: Conjunctivae and EOM are normal.  Neck: Neck supple. No tracheal deviation present.  Cardiovascular: Normal rate.   Pulmonary/Chest: Effort normal. No respiratory distress.  Musculoskeletal: Normal range of motion.  Neurological: He is alert and oriented to person, place, and time.  Skin: Skin is warm and dry.  Psychiatric: He has a normal mood and affect. His behavior is normal.  Nursing note and vitals reviewed.    Assessment & Plan:  This chart was scribed in my presence and reviewed by me personally.    ICD-9-CM ICD-10-CM   1. Essential hypertension 401.9 I10   2. Type 2 diabetes mellitus, controlled 250.00 E11.9    Recheck 3 months  Stop amlodipine  Signed, Robyn Haber, MD

## 2014-07-13 ENCOUNTER — Ambulatory Visit (INDEPENDENT_AMBULATORY_CARE_PROVIDER_SITE_OTHER): Payer: 59 | Admitting: *Deleted

## 2014-07-13 DIAGNOSIS — E291 Testicular hypofunction: Secondary | ICD-10-CM | POA: Diagnosis not present

## 2014-07-13 MED ORDER — TESTOSTERONE CYPIONATE 100 MG/ML IM SOLN: 100.0000 mg | Freq: Once | INTRAMUSCULAR | Status: DC

## 2014-07-13 MED ORDER — TESTOSTERONE CYPIONATE 100 MG/ML IM SOLN
200.0000 mg | Freq: Once | INTRAMUSCULAR | Status: AC
  Administered 2014-07-13: 200 mg via INTRAMUSCULAR

## 2014-07-13 NOTE — Progress Notes (Signed)
   Subjective:    Patient ID: Zachary Cook, male    DOB: 1932/10/06, 81 y.o.   MRN: [id number]  HPI    Review of Systems     Objective:   Physical Exam        Assessment & Plan:  Patient here for testosterone injection only.

## 2014-07-18 ENCOUNTER — Encounter: Payer: Self-pay | Admitting: Internal Medicine

## 2014-08-03 ENCOUNTER — Other Ambulatory Visit: Payer: Self-pay | Admitting: Endocrinology

## 2014-08-10 ENCOUNTER — Ambulatory Visit: Payer: Medicare Other

## 2014-08-19 ENCOUNTER — Other Ambulatory Visit: Payer: Self-pay | Admitting: Family Medicine

## 2014-09-13 ENCOUNTER — Other Ambulatory Visit (INDEPENDENT_AMBULATORY_CARE_PROVIDER_SITE_OTHER): Payer: 59

## 2014-09-13 ENCOUNTER — Other Ambulatory Visit: Payer: Self-pay

## 2014-09-13 DIAGNOSIS — E1165 Type 2 diabetes mellitus with hyperglycemia: Secondary | ICD-10-CM | POA: Diagnosis not present

## 2014-09-13 DIAGNOSIS — IMO0002 Reserved for concepts with insufficient information to code with codable children: Secondary | ICD-10-CM

## 2014-09-13 LAB — COMPREHENSIVE METABOLIC PANEL
ALT: 13 U/L (ref 0–53)
AST: 17 U/L (ref 0–37)
Albumin: 4 g/dL (ref 3.5–5.2)
Alkaline Phosphatase: 41 U/L (ref 39–117)
BUN: 25 mg/dL — AB (ref 6–23)
CO2: 24 mEq/L (ref 19–32)
Calcium: 9.5 mg/dL (ref 8.4–10.5)
Chloride: 103 mEq/L (ref 96–112)
Creatinine, Ser: 1.84 mg/dL — ABNORMAL HIGH (ref 0.40–1.50)
GFR: 37.8 mL/min — ABNORMAL LOW (ref >60.00)
Glucose, Bld: 196 mg/dL — ABNORMAL HIGH (ref 70–99)
Potassium: 4.4 mEq/L (ref 3.5–5.1)
Sodium: 138 mEq/L (ref 135–145)
Total Bilirubin: 0.8 mg/dL (ref 0.2–1.2)
Total Protein: 7.3 g/dL (ref 6.0–8.3)

## 2014-09-13 LAB — MICROALBUMIN / CREATININE URINE RATIO
CREATININE, U: 369.1 mg/dL
MICROALB/CREAT RATIO: 5 mg/g (ref 0.0–30.0)
Microalb, Ur: 18.5 mg/dL — ABNORMAL HIGH (ref 0.0–1.9)

## 2014-09-13 LAB — HEMOGLOBIN A1C: Hgb A1c MFr Bld: 6.6 % — ABNORMAL HIGH (ref 4.6–6.5)

## 2014-09-16 ENCOUNTER — Encounter: Payer: Self-pay | Admitting: Endocrinology

## 2014-09-16 ENCOUNTER — Ambulatory Visit (INDEPENDENT_AMBULATORY_CARE_PROVIDER_SITE_OTHER): Payer: 59 | Admitting: Endocrinology

## 2014-09-16 VITALS — BP 142/75 | HR 70 | Temp 98.1°F | Resp 16 | Ht 64.5 in | Wt 178.0 lb

## 2014-09-16 DIAGNOSIS — E038 Other specified hypothyroidism: Secondary | ICD-10-CM

## 2014-09-16 DIAGNOSIS — E291 Testicular hypofunction: Secondary | ICD-10-CM

## 2014-09-16 DIAGNOSIS — E1165 Type 2 diabetes mellitus with hyperglycemia: Secondary | ICD-10-CM | POA: Diagnosis not present

## 2014-09-16 DIAGNOSIS — N179 Acute kidney failure, unspecified: Secondary | ICD-10-CM

## 2014-09-16 DIAGNOSIS — N189 Chronic kidney disease, unspecified: Secondary | ICD-10-CM

## 2014-09-16 DIAGNOSIS — IMO0002 Reserved for concepts with insufficient information to code with codable children: Secondary | ICD-10-CM

## 2014-09-16 DIAGNOSIS — I10 Essential (primary) hypertension: Secondary | ICD-10-CM

## 2014-09-16 DIAGNOSIS — N289 Disorder of kidney and ureter, unspecified: Principal | ICD-10-CM

## 2014-09-16 NOTE — Progress Notes (Signed)
Patient ID: Zachary Cook, male   DOB: [birth year], 81 y.o.   MRN: [id number]           Reason for Appointment: Follow-Up for Type 2 Diabetes  Referring physician: Lauenstein  History of Present Illness:          Diagnosis: Type 2 diabetes mellitus, date of diagnosis: 2005       Past history: He was started on metformin when he was found to have diabetes on routine lab work. Details of this are not available He thinks his blood sugars had been fairly well controlled for several years with metformin alone but no records are available He has had a couple of different physicians and has not always been followed consistently for his diabetes  Blood sugars had been significantly higher in the 4 months prior to his consultation in 10/15 and his A1c had gone up to 12% with his regimen of glipizide and metformin  He was given a trial of Invokana but this did not improve his blood sugars  Recent history:   He has been on Victoza since 10/15 and this had been stopped temporarily for GI side effects Since he had blood sugars as high as 287 he tried Victoza again at the lower dose and he started this in 1/16. With gradual titration he has been able to increase the dose again He had no side effects with 1.2 mg Victoza now On his last visit because of relatively higher fasting readings he was switched from glipizide to Amaryl at suppertime; also was having occasional tendency to low sugars during the day when not eating on time He says that he is not sure whether he is taking glipizide or Amaryl  With this change his blood sugars have continued to improve  and now his A1c is down to 6.6, usually has been above 7 Home blood sugars are looking excellent although checking mostly in the mornings No symptoms of hypoglycemia lately Usually not eating breakfast  He has also continued doing exercise on his treadmill and now starting to do this every day       Oral hypoglycemic drugs the patient is  taking are: Metformin 1 g twice a day,  glipizide ER 5 mg once a day    Side effects from medications have been:  none   Glucose monitoring:  done 0.5 times a day         Glucometer: Accu-Chek       Blood Glucose readings  Recently readings are checked mostly in the mornings  Mean values apply above for all meters except median for One Touch  PRE-MEAL Fasting Lunch  8 PM  Bedtime Overall  Glucose range:  98-149   184   115, 90     Mean/median:  122      123    Self-care: The diet that the patient has been following is: None, usually low fat      Has no regular soft drinks Meals: 2 meals per day. Breakfast is usually none; bran flakes; fried food 1/7 days a week           Exercise:   walking on treadmill 15 min, 7/7         Dietician visit, most recent: None.               Weight history: Previous range  175-205  Wt Readings from Last 3 Encounters:  09/16/14 178 lb (80.74 kg)  06/30/14 175 lb (79.379 kg)  06/17/14 175 lb (79.379 kg)    Glycemic control:   Lab Results  Component Value Date   HGBA1C 6.6* 09/13/2014   HGBA1C 7.1* 06/14/2014   HGBA1C 7.4* 02/21/2014   Lab Results  Component Value Date   MICROALBUR 18.5* 09/13/2014   LDLCALC 85 12/02/2013   CREATININE 1.84* 09/13/2014    Lab on 09/13/2014  Component Date Value Ref Range Status  . Hgb A1c MFr Bld 09/13/2014 6.6* 4.6 - 6.5 % Final   Glycemic Control Guidelines for People with Diabetes:Non Diabetic:  <6%Goal of Therapy: <7%Additional Action Suggested:  >8%   . Sodium 09/13/2014 138  135 - 145 mEq/L Final  . Potassium 09/13/2014 4.4  3.5 - 5.1 mEq/L Final  . Chloride 09/13/2014 103  96 - 112 mEq/L Final  . CO2 09/13/2014 24  19 - 32 mEq/L Final  . Glucose, Bld 09/13/2014 196* 70 - 99 mg/dL Final  . BUN 09/13/2014 25* 6 - 23 mg/dL Final  . Creatinine, Ser 09/13/2014 1.84* 0.40 - 1.50 mg/dL Final  . Total Bilirubin 09/13/2014 0.8  0.2 - 1.2 mg/dL Final  . Alkaline Phosphatase 09/13/2014 41  39 - 117 U/L Final   . AST 09/13/2014 17  0 - 37 U/L Final  . ALT 09/13/2014 13  0 - 53 U/L Final  . Total Protein 09/13/2014 7.3  6.0 - 8.3 g/dL Final  . Albumin 09/13/2014 4.0  3.5 - 5.2 g/dL Final  . Calcium 09/13/2014 9.5  8.4 - 10.5 mg/dL Final  . GFR 09/13/2014 37.80* >60.00 mL/min Final  . Microalb, Ur 09/13/2014 18.5* 0.0 - 1.9 mg/dL Final  . Creatinine,U 09/13/2014 369.1   Final  . Microalb Creat Ratio 09/13/2014 5.0  0.0 - 30.0 mg/g Final        Medication List       This list is accurate as of: 09/16/14 11:59 PM.  Always use your most recent med list.               ACCU-CHEK NANO SMARTVIEW W/DEVICE Kit     ACCU-CHEK SMARTVIEW test strip  Generic drug:  glucose blood     aspirin 81 MG tablet  Take 81 mg by mouth daily.     esomeprazole 40 MG capsule  Commonly known as:  NEXIUM  TAKE ONE CAPSULE BY MOUTH DAILY     glimepiride 2 MG tablet  Commonly known as:  AMARYL  Take 1 tablet (2 mg total) by mouth daily before supper.     Insulin Pen Needle 31G X 5 MM Misc  Use one per day with Victoza     ketoconazole 2 % cream  Commonly known as:  NIZORAL  Apply 1 application topically daily.     levothyroxine 137 MCG tablet  Commonly known as:  SYNTHROID  Take 1 tablet (137 mcg total) by mouth daily before breakfast.     lisinopril 10 MG tablet  Commonly known as:  PRINIVIL,ZESTRIL  TAKE 1 TABLET BY MOUTH DAILY     metFORMIN 1000 MG tablet  Commonly known as:  GLUCOPHAGE  TAKE 1 TABLET BY MOUTH TWICE DAILY WITH A MEAL.  "OV NEEDED FOR ADDITIONAL REFILLS"     simvastatin 20 MG tablet  Commonly known as:  ZOCOR  TAKE 1 TABLET BY MOUTH AT BEDTIME     tamsulosin 0.4 MG Caps capsule  Commonly known as:  FLOMAX  TAKE ONE CAPSULE BY MOUTH DAILY     testosterone cypionate 200 MG/ML injection  Commonly known as:  DEPOTESTOSTERONE CYPIONATE  USE AS DIRECTED EVERY MONTH     VICTOZA 18 MG/3ML Sopn  Generic drug:  Liraglutide  INJECT 1.2 MG UNDER THE SKIN DAILY         Allergies: No Known Allergies  Past Medical History  Diagnosis Date  . Diabetes mellitus without complication     Past Surgical History  Procedure Laterality Date  . Vasectomy      Family History  Problem Relation Age of Onset  . Heart disease Mother   . Diabetes Mother   . Cancer Father     Social History:  reports that he has never smoked. He does not have any smokeless tobacco history on file. He reports that he does not drink alcohol or use illicit drugs.    Review of Systems   RENAL insufficiency: His creatinine appears to be higher on this visit for no apparent reason. On the last visit his blood pressure was relatively low and he was told to reduce amlodipine to half tablet He does not know what medications he is taking now and has an old list with him  Lab Results  Component Value Date   CREATININE 1.84* 09/13/2014    HYPERTENSION: His blood pressure medications have been amlodipine and lisinopril but he does not know which one he has stopped since his last visit Blood pressure not being checked at home  Most recent eye exam was 9/15       Lipids: Most recent labs as below, currently taking 20 mg Zocor       Lab Results  Component Value Date   CHOL 166 12/02/2013   HDL 42 12/02/2013   LDLCALC 85 12/02/2013   TRIG 196* 12/02/2013   CHOLHDL 4.0 12/02/2013                  Thyroid:  He thinks he was found to have hypothyroidism about the year 2000 on routine labs without symptoms. Apparently he did not  feel any different with taking the thyroid supplement.  His TSH record indicates that it had been previously consistently low before his consultation here  His dose had been progressively reduced and now he is on 137 g per day. TSH is now  normal as of 4/16 He says that he does not feel as weak and has good endurance, no heat or cold intolerance or shakiness Labs as follows:  Lab Results  Component Value Date   TSH 2.63 06/14/2014   TSH 0.23*  02/21/2014   TSH 0.03* 01/10/2014   FREET4 0.99 06/14/2014   FREET4 1.47 02/21/2014   FREET4 1.84* 01/10/2014       He has been told to have hypogonadism of unclear etiology for the last few years. He was switched from the topical preparation to the injections because he was told that the cream causes heart attacks He has been followed by PCP He has been on injections for at least 2 years but No details of his evaluation or follow-up levels are available  No results found for: TESTOSTERONE   LABS:  Lab on 09/13/2014  Component Date Value Ref Range Status  . Hgb A1c MFr Bld 09/13/2014 6.6* 4.6 - 6.5 % Final   Glycemic Control Guidelines for People with Diabetes:Non Diabetic:  <6%Goal of Therapy: <7%Additional Action Suggested:  >8%   . Sodium 09/13/2014 138  135 - 145 mEq/L Final  . Potassium 09/13/2014 4.4  3.5 - 5.1 mEq/L Final  . Chloride 09/13/2014 103  96 - 112  mEq/L Final  . CO2 09/13/2014 24  19 - 32 mEq/L Final  . Glucose, Bld 09/13/2014 196* 70 - 99 mg/dL Final  . BUN 09/13/2014 25* 6 - 23 mg/dL Final  . Creatinine, Ser 09/13/2014 1.84* 0.40 - 1.50 mg/dL Final  . Total Bilirubin 09/13/2014 0.8  0.2 - 1.2 mg/dL Final  . Alkaline Phosphatase 09/13/2014 41  39 - 117 U/L Final  . AST 09/13/2014 17  0 - 37 U/L Final  . ALT 09/13/2014 13  0 - 53 U/L Final  . Total Protein 09/13/2014 7.3  6.0 - 8.3 g/dL Final  . Albumin 09/13/2014 4.0  3.5 - 5.2 g/dL Final  . Calcium 09/13/2014 9.5  8.4 - 10.5 mg/dL Final  . GFR 09/13/2014 37.80* >60.00 mL/min Final  . Microalb, Ur 09/13/2014 18.5* 0.0 - 1.9 mg/dL Final  . Creatinine,U 09/13/2014 369.1   Final  . Microalb Creat Ratio 09/13/2014 5.0  0.0 - 30.0 mg/g Final    Physical Examination:  BP 142/75 mmHg  Pulse 70  Temp(Src) 98.1 F (36.7 C)  Resp 16  Ht 5' 4.5" (1.638 m)  Wt 178 lb (80.74 kg)  BMI 30.09 kg/m2  SpO2 95%  No pedal edema    ASSESSMENT:  Diabetes type 2, previously  with significant hyperglycemia on  metformin and low-dose glipizide With  Victoza he has benefited significantly with markedly improved blood sugars overall He is at 1.2 mg dose now and has no GI side effects with this. As discussed in history of present illness his A1c is below 7 for the first time He feels fairly good and is recently trying to be very compliant with exercise and diet regimen  He has only minimal high blood sugars at home but not checking enough readings after meals He can do better with consistent efforts to lose weight  HYPERTENSION: Blood pressure is fairly good but as discussed above not clear what doses of medications he is taking and he needs to call back with this Also previously had a tendency to higher creatinine and potassium with lisinopril  HYPOTHYROID: TSH levels now normal as of 4/16 and will need follow-up again on the next visit Subjectively feels fairly good  HYPERLIPIDEMIA: Will need follow-up levels  HYPOGONADISM: Need follow-up levels to assess adequacy of his treatment or need for adjustment of the dose  PLAN:   He will continue Victoza 1.2 mg  Continue Amaryl 2 mg daily at suppertime instead of glipizide ER.  Also he needs to confirm that he actually has switched the medications as directed previously   Check blood sugars after supper and periodically after lunch also.  Discussed postprandial blood sugar targets  Continue same dose of Synthroid 137  Stop metformin ER in the morning until renal function back to normal    Stop lisinopril and follow-up with PCP for evaluation of renal dysfunction, can continue with amlodipine   Patient Instructions  Stop Lisinopril and start amlodipine 14m daily  Check to see if taking GLIMEPERIDE  STOP am Metformin till kidney test ok     Total visit time including management of multiple problems, review of labs, counseling = 25 minutes   Brittinie Wherley 09/17/2014, 2:24 PM   Note: This office note was prepared with [employer] Any transcriptional errors that result from this process are unintentional.

## 2014-09-16 NOTE — Patient Instructions (Addendum)
Stop Lisinopril and start amlodipine 5mg  daily  Check to see if taking GLIMEPERIDE  STOP am Metformin till kidney test ok

## 2014-09-17 ENCOUNTER — Other Ambulatory Visit: Payer: Self-pay | Admitting: Endocrinology

## 2014-09-18 ENCOUNTER — Ambulatory Visit (INDEPENDENT_AMBULATORY_CARE_PROVIDER_SITE_OTHER): Payer: Medicare Other | Admitting: Family Medicine

## 2014-09-18 VITALS — BP 124/64 | HR 70 | Temp 97.9°F | Resp 16 | Ht 65.0 in | Wt 179.2 lb

## 2014-09-18 DIAGNOSIS — Z23 Encounter for immunization: Secondary | ICD-10-CM | POA: Diagnosis not present

## 2014-09-18 DIAGNOSIS — I10 Essential (primary) hypertension: Secondary | ICD-10-CM

## 2014-09-18 DIAGNOSIS — E119 Type 2 diabetes mellitus without complications: Secondary | ICD-10-CM

## 2014-09-18 MED ORDER — METFORMIN HCL 1000 MG PO TABS: ORAL_TABLET | ORAL | Status: DC

## 2014-09-18 NOTE — Progress Notes (Signed)
° °  Subjective:    Patient ID: Zachary Cook, male    DOB: [birth year], 81 y.o.   MRN: [id number] This chart was scribed for Robyn Haber, MD by Zola Button, Medical Scribe. This patient was seen in Room 1 and the patient's care was started at 8:08 AM.   HPI HPI Comments: Zachary Cook is a 81 y.o. male with a history of DM who presents to the [facility] for a follow-up.  Patient saw Dr. Kumar 2 days ago. Dr. Dwyane Dee had some concern for his elevated blood pressure at that visit (142/75), but it is relatively normal today (124/64). Dr. Dwyane Dee also reduced his metformin dose in half due to concerns for his kidneys; he now takes metformin 1000 mg once a day in the evening instead of twice a day. He was told to follow up in 3 months.  Patient has been losing weight, but he notes he has gained 4 pounds over the past few weeks. He plans to increase his current exercise of 3 times a week on the treadmill to exercising on the treadmill daily.  Immunizations: He had his shingles vaccine at the beginning of this year. He does not remember when his last tetanus shot was.  Review of Systems     Objective:   Physical Exam CONSTITUTIONAL: Well developed/well nourished HEAD: Normocephalic/atraumatic EYES: EOM/PERRL ENMT: Mucous membranes moist NECK: supple no meningeal signs SPINE: entire spine nontender CV: S1/S2 noted, no murmurs/rubs/gallops noted LUNGS: Lungs are clear to auscultation bilaterally, no apparent distress ABDOMEN: soft, nontender, no rebound or guarding GU: no cva tenderness NEURO: Pt is awake/alert, moves all extremitiesx4 EXTREMITIES: pulses normal, full ROM SKIN: warm, color normal PSYCH: no abnormalities of mood noted     Assessment & Plan:   This chart was scribed in my presence and reviewed by me personally.    ICD-9-CM ICD-10-CM   1. Type 2 diabetes mellitus, controlled 250.00 E11.9 metFORMIN (GLUCOPHAGE) 1000 MG tablet     Td vaccine  greater than or equal to 7yo preservative free IM  2. Essential hypertension 401.9 I10 Td vaccine greater than or equal to 7yo preservative free IM   Although patient is asking for pounds since his last visit, he is exercising regularly and he plans to increase his routine 2 daily for him 3 times a week. He's not having any new symptoms and his recent visit with Dr. Dwyane Dee suggests that his diabetes is very well controlled. I'm glad he's reducing his metformin given his recent creatinine elevation.  I will see patient in the next 6 months and Dr. Ronnie Derby planning on seeing him in 3 months. Regarding to keep him off of the amlodipine because blood pressure has normalized off of this.  Signed, Robyn Haber, MD

## 2014-09-18 NOTE — Patient Instructions (Signed)
Your lab results suggest that you'll do fine with the one metformin at night and no more amlodipine. Eliminating the esomeprazole will reduce your risk of pneumonia. Follow up with Dr. Dwyane Dee in 3 months and continue to monitor your blood pressure. You need to get a flu shot in the fall.

## 2014-10-21 ENCOUNTER — Other Ambulatory Visit: Payer: Self-pay | Admitting: Endocrinology

## 2014-12-02 ENCOUNTER — Other Ambulatory Visit: Payer: Self-pay | Admitting: *Deleted

## 2014-12-02 DIAGNOSIS — E119 Type 2 diabetes mellitus without complications: Secondary | ICD-10-CM

## 2014-12-02 MED ORDER — GLUCOSE BLOOD VI STRP: ORAL_STRIP | Status: DC

## 2014-12-14 ENCOUNTER — Other Ambulatory Visit (INDEPENDENT_AMBULATORY_CARE_PROVIDER_SITE_OTHER): Payer: Medicare Other

## 2014-12-14 DIAGNOSIS — E038 Other specified hypothyroidism: Secondary | ICD-10-CM

## 2014-12-14 DIAGNOSIS — E291 Testicular hypofunction: Secondary | ICD-10-CM | POA: Diagnosis not present

## 2014-12-14 DIAGNOSIS — E1165 Type 2 diabetes mellitus with hyperglycemia: Secondary | ICD-10-CM

## 2014-12-14 DIAGNOSIS — IMO0002 Reserved for concepts with insufficient information to code with codable children: Secondary | ICD-10-CM

## 2014-12-14 LAB — LIPID PANEL
CHOL/HDL RATIO: 5
CHOLESTEROL: 191 mg/dL (ref 0–200)
HDL: 42.1 mg/dL (ref >39.00)
NonHDL: 149.31
Triglycerides: 251 mg/dL — ABNORMAL HIGH (ref 0.0–149.0)
VLDL: 50.2 mg/dL — AB (ref 0.0–40.0)

## 2014-12-14 LAB — COMPREHENSIVE METABOLIC PANEL
ALBUMIN: 4.1 g/dL (ref 3.5–5.2)
ALK PHOS: 52 U/L (ref 39–117)
ALT: 13 U/L (ref 0–53)
AST: 15 U/L (ref 0–37)
BUN: 26 mg/dL — AB (ref 6–23)
CHLORIDE: 102 meq/L (ref 96–112)
CO2: 28 mEq/L (ref 19–32)
Calcium: 9.8 mg/dL (ref 8.4–10.5)
Creatinine, Ser: 1.38 mg/dL (ref 0.40–1.50)
GFR: 52.64 mL/min — AB (ref >60.00)
Glucose, Bld: 226 mg/dL — ABNORMAL HIGH (ref 70–99)
POTASSIUM: 4.8 meq/L (ref 3.5–5.1)
SODIUM: 138 meq/L (ref 135–145)
Total Bilirubin: 0.6 mg/dL (ref 0.2–1.2)
Total Protein: 7.5 g/dL (ref 6.0–8.3)

## 2014-12-14 LAB — TSH: TSH: 1.38 u[IU]/mL (ref 0.35–4.50)

## 2014-12-14 LAB — LDL CHOLESTEROL, DIRECT: Direct LDL: 125 mg/dL

## 2014-12-14 LAB — T4, FREE: FREE T4: 0.94 ng/dL (ref 0.60–1.60)

## 2014-12-14 LAB — HEMOGLOBIN A1C: Hgb A1c MFr Bld: 8 % — ABNORMAL HIGH (ref 4.6–6.5)

## 2014-12-14 LAB — TESTOSTERONE: Testosterone: 174.72 ng/dL — ABNORMAL LOW (ref 300.00–890.00)

## 2014-12-19 ENCOUNTER — Ambulatory Visit (INDEPENDENT_AMBULATORY_CARE_PROVIDER_SITE_OTHER): Payer: 59 | Admitting: Endocrinology

## 2014-12-19 ENCOUNTER — Encounter: Payer: Self-pay | Admitting: Endocrinology

## 2014-12-19 VITALS — BP 126/72 | HR 68 | Temp 98.2°F | Resp 14 | Ht 65.0 in | Wt 184.2 lb

## 2014-12-19 DIAGNOSIS — E785 Hyperlipidemia, unspecified: Secondary | ICD-10-CM

## 2014-12-19 DIAGNOSIS — E291 Testicular hypofunction: Secondary | ICD-10-CM

## 2014-12-19 DIAGNOSIS — E1165 Type 2 diabetes mellitus with hyperglycemia: Secondary | ICD-10-CM | POA: Diagnosis not present

## 2014-12-19 DIAGNOSIS — N289 Disorder of kidney and ureter, unspecified: Secondary | ICD-10-CM

## 2014-12-19 MED ORDER — GLIMEPIRIDE 2 MG PO TABS: 4.0000 mg | ORAL_TABLET | Freq: Every day | ORAL | Status: DC

## 2014-12-19 NOTE — Patient Instructions (Signed)
Check blood sugars on waking up .Marland Kitchen 2-3 .Marland Kitchen times a week Also check blood sugars about 2 hours after a meal and do this after different meals by rotation  Recommended blood sugar levels on waking up is 90-130 and about 2 hours after meal is 140-180 Please bring blood sugar monitor to each visit.  Take Metformin twice daily  Glimeperide: 2mg , take 2 before dinner. When sugars < 110 go back to 1 daily

## 2014-12-19 NOTE — Progress Notes (Signed)
Patient ID: Zachary Cook, male   DOB: 03-11-1933, 81 y.o.   MRN: [id number]           Reason for Appointment: Follow-Up for Type 2 Diabetes  Referring physician: Lauenstein  History of Present Illness:          Diagnosis: Type 2 diabetes mellitus, date of diagnosis: 2005       Past history: He was started on metformin when he was found to have diabetes on routine lab work. Details of this are not available He thinks his blood sugars had been fairly well controlled for several years with metformin alone but no records are available He has had a couple of different physicians and has not always been followed consistently for his diabetes  Blood sugars had been significantly higher in the 4 months prior to his consultation in 10/15 and his A1c had gone up to 12% with his regimen of glipizide and metformin  He was given a trial of Invokana but this did not improve his blood sugars  Recent history:   He has been on Victoza since 10/15 and this was restarted in 1/16, subsequently tolerated this better. Now taking 1.2 mg  On his last visit because of his creatinine of 1.8 his metformin was stopped in the evening and he was told to follow-up with his PCP about his renal dysfunction. However he has not had any follow-up creatinine levels until now  More recently his blood sugars appear to be going up especially in the last 2 weeks even though he is taking his Victoza and Amaryl. However he has not been able to exercise in the last 2 weeks or so because of sprained ankle Usually not eating breakfast and his diet is usually fairly good A1c has gone up significantly to 8%  Previously doing exercise on his treadmill       Oral hypoglycemic drugs the patient is taking are: Metformin 1 g 1x a day,  glipizide ER 5 mg once a day    Side effects from medications have been:  none   Glucose monitoring:  done 0.5 times a day         Glucometer: Accu-Chek       Blood Glucose readings  Recently  readings are checked mostly in the mornings  Mean values apply above for all meters except median for One Touch  PRE-MEAL Fasting Lunch Dinner Bedtime Overall  Glucose range: 114-191 150, 199  262   Mean/median: 142    164    Self-care: The diet that the patient has been following is: None, usually low fat      Meals: 2 meals per day. Breakfast is usually none; sometimes has bran flakes; fried food 1/7 days a week           Exercise:   walking on treadmill 15 min, unable to recently Dietician visit, most recent: None.               Weight history: Previous range  175-205  Wt Readings from Last 3 Encounters:  12/19/14 184 lb 3.2 oz (83.553 kg)  09/18/14 179 lb 3.2 oz (81.285 kg)  09/16/14 178 lb (80.74 kg)    Glycemic control:   Lab Results  Component Value Date   HGBA1C 8.0* 12/14/2014   HGBA1C 6.6* 09/13/2014   HGBA1C 7.1* 06/14/2014   Lab Results  Component Value Date   MICROALBUR 18.5* 09/13/2014   LDLCALC 85 12/02/2013   CREATININE 1.38 12/14/2014  Appointment on 12/14/2014  Component Date Value Ref Range Status  . Hgb A1c MFr Bld 12/14/2014 8.0* 4.6 - 6.5 % Final   Glycemic Control Guidelines for People with Diabetes:Non Diabetic:  <6%Goal of Therapy: <7%Additional Action Suggested:  >8%   . Sodium 12/14/2014 138  135 - 145 mEq/L Final  . Potassium 12/14/2014 4.8  3.5 - 5.1 mEq/L Final  . Chloride 12/14/2014 102  96 - 112 mEq/L Final  . CO2 12/14/2014 28  19 - 32 mEq/L Final  . Glucose, Bld 12/14/2014 226* 70 - 99 mg/dL Final  . BUN 12/14/2014 26* 6 - 23 mg/dL Final  . Creatinine, Ser 12/14/2014 1.38  0.40 - 1.50 mg/dL Final  . Total Bilirubin 12/14/2014 0.6  0.2 - 1.2 mg/dL Final  . Alkaline Phosphatase 12/14/2014 52  39 - 117 U/L Final  . AST 12/14/2014 15  0 - 37 U/L Final  . ALT 12/14/2014 13  0 - 53 U/L Final  . Total Protein 12/14/2014 7.5  6.0 - 8.3 g/dL Final  . Albumin 12/14/2014 4.1  3.5 - 5.2 g/dL Final  . Calcium 12/14/2014 9.8  8.4 - 10.5  mg/dL Final  . GFR 12/14/2014 52.64* >60.00 mL/min Final  . Cholesterol 12/14/2014 191  0 - 200 mg/dL Final   ATP III Classification       Desirable:  < 200 mg/dL               Borderline High:  200 - 239 mg/dL          High:  > = 240 mg/dL  . Triglycerides 12/14/2014 251.0* 0.0 - 149.0 mg/dL Final   Normal:  <150 mg/dLBorderline High:  150 - 199 mg/dL  . HDL 12/14/2014 42.10  >39.00 mg/dL Final  . VLDL 12/14/2014 50.2* 0.0 - 40.0 mg/dL Final  . Total CHOL/HDL Ratio 12/14/2014 5   Final                  Men          Women1/2 Average Risk     3.4          3.3Average Risk          5.0          4.42X Average Risk          9.6          7.13X Average Risk          15.0          11.0                      . NonHDL 12/14/2014 149.31   Final   NOTE:  Non-HDL goal should be 30 mg/dL higher than patient's LDL goal (i.e. LDL goal of < 70 mg/dL, would have non-HDL goal of < 100 mg/dL)  . TSH 12/14/2014 1.38  0.35 - 4.50 uIU/mL Final  . Free T4 12/14/2014 0.94  0.60 - 1.60 ng/dL Final  . Testosterone 12/14/2014 174.72* 300.00 - 890.00 ng/dL Final  . Direct LDL 12/14/2014 125.0   Final   Optimal:  <100 mg/dLNear or Above Optimal:  100-129 mg/dLBorderline High:  130-159 mg/dLHigh:  160-189 mg/dLVery High:  >190 mg/dL        Medication List       This list is accurate as of: 12/19/14  9:26 PM.  Always use your most recent med list.  ACCU-CHEK NANO SMARTVIEW W/DEVICE Kit     aspirin 81 MG tablet  Take 81 mg by mouth daily.     esomeprazole 40 MG capsule  Commonly known as:  NEXIUM  TAKE ONE CAPSULE BY MOUTH DAILY     glimepiride 2 MG tablet  Commonly known as:  AMARYL  Take 2 tablets (4 mg total) by mouth daily before supper.     glucose blood test strip  Commonly known as:  ACCU-CHEK SMARTVIEW  Test blood sugar once daily     Insulin Pen Needle 31G X 5 MM Misc  Use one per day with Victoza     ketoconazole 2 % cream  Commonly known as:  NIZORAL  Apply 1 application  topically daily.     levothyroxine 137 MCG tablet  Commonly known as:  SYNTHROID, LEVOTHROID  TAKE 1 TABLET BY MOUTH EVERY MORNING BEFORE BREAKFAST     metFORMIN 1000 MG tablet  Commonly known as:  GLUCOPHAGE  One each evening     simvastatin 20 MG tablet  Commonly known as:  ZOCOR  TAKE 1 TABLET BY MOUTH AT BEDTIME     tamsulosin 0.4 MG Caps capsule  Commonly known as:  FLOMAX  TAKE ONE CAPSULE BY MOUTH DAILY     VICTOZA 18 MG/3ML Sopn  Generic drug:  Liraglutide  INJECT 1.2 MG UNDER THE SKIN DAILY        Allergies: No Known Allergies  Past Medical History  Diagnosis Date  . Diabetes mellitus without complication [facility])     Past Surgical History  Procedure Laterality Date  . Vasectomy      Family History  Problem Relation Age of Onset  . Heart disease Mother   . Diabetes Mother   . Cancer Father     Social History:  reports that he has never smoked. He does not have any smokeless tobacco history on file. He reports that he does not drink alcohol or use illicit drugs.    Review of Systems   RENAL insufficiency: His creatinine was found to be higher on previous visit No changes in blood pressure medications made, was seen in follow-up with PCP after his last visit Creatinine is back to normal  Lab Results  Component Value Date   CREATININE 1.38 12/14/2014    HYPERTENSION: His blood pressure medications have been amlodipine and lisinopril but he does not know which one he has stopped since his last visit Blood pressure not being checked at home  Most recent eye exam was 9/15       Lipids: Most recent labs as below, currently taking 20 mg Zocor       Lab Results  Component Value Date   CHOL 191 12/14/2014   HDL 42.10 12/14/2014   LDLCALC 85 12/02/2013   LDLDIRECT 125.0 12/14/2014   TRIG 251.0* 12/14/2014   CHOLHDL 5 12/14/2014                  Thyroid:  He thinks he was found to have hypothyroidism about the year 2000 on routine labs without  symptoms. Apparently he did not  feel any different with taking the thyroid supplement.  His TSH record indicates that it had been previously consistently low before his consultation here  His dose had been progressively reduced and  he is on 137 g per day with more consistent control. He says that he does not feel as weak and has good endurance, no heat or cold intolerance or shakiness Labs as  follows:  Lab Results  Component Value Date   TSH 1.38 12/14/2014   TSH 2.63 06/14/2014   TSH 0.23* 02/21/2014   FREET4 0.94 12/14/2014   FREET4 0.99 06/14/2014   FREET4 1.47 02/21/2014       He has been told to have hypogonadism of unclear etiology for the last few years. He was switched from the topical preparation to the injections because he was told that the gel  causes heart attacks He has been followed by PCP He has been on injections for at least 2 years    Lab Results  Component Value Date   TESTOSTERONE 174.72* 12/14/2014     LABS:  Appointment on 12/14/2014  Component Date Value Ref Range Status  . Hgb A1c MFr Bld 12/14/2014 8.0* 4.6 - 6.5 % Final   Glycemic Control Guidelines for People with Diabetes:Non Diabetic:  <6%Goal of Therapy: <7%Additional Action Suggested:  >8%   . Sodium 12/14/2014 138  135 - 145 mEq/L Final  . Potassium 12/14/2014 4.8  3.5 - 5.1 mEq/L Final  . Chloride 12/14/2014 102  96 - 112 mEq/L Final  . CO2 12/14/2014 28  19 - 32 mEq/L Final  . Glucose, Bld 12/14/2014 226* 70 - 99 mg/dL Final  . BUN 12/14/2014 26* 6 - 23 mg/dL Final  . Creatinine, Ser 12/14/2014 1.38  0.40 - 1.50 mg/dL Final  . Total Bilirubin 12/14/2014 0.6  0.2 - 1.2 mg/dL Final  . Alkaline Phosphatase 12/14/2014 52  39 - 117 U/L Final  . AST 12/14/2014 15  0 - 37 U/L Final  . ALT 12/14/2014 13  0 - 53 U/L Final  . Total Protein 12/14/2014 7.5  6.0 - 8.3 g/dL Final  . Albumin 12/14/2014 4.1  3.5 - 5.2 g/dL Final  . Calcium 12/14/2014 9.8  8.4 - 10.5 mg/dL Final  . GFR 12/14/2014  52.64* >60.00 mL/min Final  . Cholesterol 12/14/2014 191  0 - 200 mg/dL Final   ATP III Classification       Desirable:  < 200 mg/dL               Borderline High:  200 - 239 mg/dL          High:  > = 240 mg/dL  . Triglycerides 12/14/2014 251.0* 0.0 - 149.0 mg/dL Final   Normal:  <150 mg/dLBorderline High:  150 - 199 mg/dL  . HDL 12/14/2014 42.10  >39.00 mg/dL Final  . VLDL 12/14/2014 50.2* 0.0 - 40.0 mg/dL Final  . Total CHOL/HDL Ratio 12/14/2014 5   Final                  Men          Women1/2 Average Risk     3.4          3.3Average Risk          5.0          4.42X Average Risk          9.6          7.13X Average Risk          15.0          11.0                      . NonHDL 12/14/2014 149.31   Final   NOTE:  Non-HDL goal should be 30 mg/dL higher than patient's LDL goal (i.e. LDL goal of < 70 mg/dL, would  have non-HDL goal of < 100 mg/dL)  . TSH 12/14/2014 1.38  0.35 - 4.50 uIU/mL Final  . Free T4 12/14/2014 0.94  0.60 - 1.60 ng/dL Final  . Testosterone 12/14/2014 174.72* 300.00 - 890.00 ng/dL Final  . Direct LDL 12/14/2014 125.0   Final   Optimal:  <100 mg/dLNear or Above Optimal:  100-129 mg/dLBorderline High:  130-159 mg/dLHigh:  160-189 mg/dLVery High:  >190 mg/dL    Physical Examination:  BP 126/72 mmHg  Pulse 68  Temp(Src) 98.2 F (36.8 C)  Resp 14  Ht _0  (1.651 m)  Wt 184 lb 3.2 oz (83.553 kg)  BMI 30.65 kg/m2  SpO2 92%  No pedal edema    ASSESSMENT:  Diabetes type 2, previously  with significant hyperglycemia on metformin and low-dose glipizide With  Victoza he blood sugars had improved  significantly with A1c below 7% More recently because of reducing metformin and possibly from not being being able to exercise he is having much higher readings A1c is up to 8%  His blood sugars are higher in the morning than before but he has not checking after meals again  HYPERTENSION: Blood pressure is fairly good He previously had a tendency to higher creatinine and  potassium with lisinopril, now appears to be not taking any medications  RENAL dysfunction: Creatinine is near-normal and not clear why the level was high on the last visit  HYPOTHYROID: TSH levels now normal  Subjectively feels fairly good; he will continue the same dose of 137 g  HYPERLIPIDEMIA: Will need to increase simvastatin to 40 mg  HYPOGONADISM: Need to confirm compliance with his regimen  PLAN:   He will continue Victoza 1.2 mg  Additional  Amaryl 2 mg daily at suppertime.  May need to stop this when his blood sugars are below about 110  Discussed timing and targets of blood sugar monitoring and need to check it more consistently  Metformin twice a day  Continue same dose of Synthroid 137  Continue following urine microalbumin and blood pressure, may consider using low-dose ARB drug if blood pressure higher  See above discussion for treatment of lipids and hypogonadism   Patient Instructions  Check blood sugars on waking up .Marland Kitchen 2-3 .Marland Kitchen times a week Also check blood sugars about 2 hours after a meal and do this after different meals by rotation  Recommended blood sugar levels on waking up is 90-130 and about 2 hours after meal is 140-180 Please bring blood sugar monitor to each visit.  Take Metformin twice daily  Glimeperide: 92m, take 2 before dinner. When sugars < 110 go back to 1 daily      Total visit time including management of multiple problems, review of labs, counseling = 25 minutes   Dhamar Gregory 12/19/2014, 9:26 PM   Note: This office note was prepared with [place] agent Any transcriptional errors that result from this process are unintentional.

## 2014-12-20 ENCOUNTER — Other Ambulatory Visit: Payer: Self-pay | Admitting: Family Medicine

## 2014-12-20 DIAGNOSIS — E785 Hyperlipidemia, unspecified: Secondary | ICD-10-CM

## 2014-12-20 DIAGNOSIS — E291 Testicular hypofunction: Secondary | ICD-10-CM

## 2014-12-20 MED ORDER — SIMVASTATIN 40 MG PO TABS: 40.0000 mg | ORAL_TABLET | Freq: Every day | ORAL | Status: DC

## 2014-12-20 MED ORDER — TESTOSTERONE CYPIONATE 100 MG/ML IM SOLN: 200.0000 mg | INTRAMUSCULAR | Status: DC

## 2014-12-20 NOTE — Progress Notes (Signed)
Quick Note:  Please check to see if he is taking testosterone injections and the last dose and date. His level is low. Also his cholesterol is too high, need to increase his simvastatin to 40 mg instead of 20 ______

## 2014-12-28 ENCOUNTER — Other Ambulatory Visit: Payer: Self-pay | Admitting: *Deleted

## 2014-12-28 MED ORDER — TESTOSTERONE 20.25 MG/1.25GM (1.62%) TD GEL: TRANSDERMAL | Status: DC

## 2015-01-12 ENCOUNTER — Other Ambulatory Visit: Payer: Self-pay | Admitting: Family Medicine

## 2015-01-19 ENCOUNTER — Ambulatory Visit (INDEPENDENT_AMBULATORY_CARE_PROVIDER_SITE_OTHER): Payer: Medicare Other | Admitting: Family Medicine

## 2015-01-19 VITALS — BP 138/80 | HR 82 | Temp 98.6°F | Wt 178.0 lb

## 2015-01-19 DIAGNOSIS — E119 Type 2 diabetes mellitus without complications: Secondary | ICD-10-CM | POA: Diagnosis not present

## 2015-01-19 LAB — POCT GLYCOSYLATED HEMOGLOBIN (HGB A1C): Hemoglobin A1C: 7.8

## 2015-01-19 LAB — HEMOGLOBIN A1C: Hgb A1c MFr Bld: 7.8 % — AB (ref 4.0–6.0)

## 2015-01-19 NOTE — Progress Notes (Signed)
This chart was scribed for Robyn Haber, MD by Moises Blood, medical scribe at [facility].The patient was seen in exam room 12 and the patient's care was started at 8:27 AM.  Patient ID: Zachary Cook MRN: [id number], DOB: Mar 23, 1932, 81 y.o. Date of Encounter: 01/19/2015  Primary Physician: Robyn Haber, MD  Chief Complaint:  Chief Complaint  Patient presents with   Diabetes    follow up - daily glucose 98-106   Follow-up    Dr Dwyane Dee put on testosterone - pt wants to stop    HPI:  Zachary Cook is a 81 y.o. male who presents to [facility] for follow up.  He saw Dr. Dwyane Dee and was put on testosterone $248 for copay. He's been having diarrhea, and nausea after taking the testosterone.   He notes that his daily glucose check at home is 98-106. He's trying to eat healthier and more regularly. He notes that he's loss some weight.   Past Medical History  Diagnosis Date   Diabetes mellitus without complication ([place])      Home Meds: Prior to Admission medications   Medication Sig Start Date End Date Taking? Authorizing Provider  aspirin 81 MG tablet Take 81 mg by mouth daily.    Historical Provider, MD  Blood Glucose Monitoring Suppl (ACCU-CHEK NANO SMARTVIEW) W/DEVICE KIT  12/02/13   Historical Provider, MD  esomeprazole (NEXIUM) 40 MG capsule TAKE ONE CAPSULE BY MOUTH DAILY 06/08/14   Robyn Haber, MD  glimepiride (AMARYL) 2 MG tablet Take 2 tablets (4 mg total) by mouth daily before supper. 12/19/14   Elayne Snare, MD  glucose blood (ACCU-CHEK SMARTVIEW) test strip Test blood sugar once daily 12/02/14   Robyn Haber, MD  Insulin Pen Needle 31G X 5 MM MISC Use one per day with Victoza 03/28/14   Elayne Snare, MD  ketoconazole (NIZORAL) 2 % cream Apply 1 application topically daily. 02/24/14   Robyn Haber, MD  levothyroxine (SYNTHROID, LEVOTHROID) 137 MCG tablet TAKE 1 TABLET BY MOUTH EVERY MORNING BEFORE BREAKFAST 09/17/14    Elayne Snare, MD  metFORMIN (GLUCOPHAGE) 1000 MG tablet One each evening 09/18/14   Robyn Haber, MD  simvastatin (ZOCOR) 40 MG tablet Take 1 tablet (40 mg total) by mouth at bedtime. 12/20/14   Robyn Haber, MD  tamsulosin (FLOMAX) 0.4 MG CAPS capsule TAKE ONE CAPSULE BY MOUTH DAILY 12/02/13   Robyn Haber, MD  Testosterone (ANDROGEL) 20.25 MG/1.25GM (1.62%) GEL Use 1 pump on each arm daily 12/28/14   Elayne Snare, MD  VICTOZA 18 MG/3ML SOPN INJECT 1.2 MG UNDER THE SKIN DAILY 08/03/14   Elayne Snare, MD    Allergies: No Known Allergies  Social History   Social History   Marital Status: Married    Spouse Name: N/A   Number of Children: N/A   Years of Education: N/A   Occupational History   Not on file.   Social History Main Topics   Smoking status: Never Smoker    Smokeless tobacco: Never Used   Alcohol Use: No   Drug Use: No   Sexual Activity: Not on file   Other Topics Concern   Not on file   Social History Narrative     Review of Systems: Constitutional: negative for fever, chills, night sweats, weight changes, or fatigue  HEENT: negative for vision changes, hearing loss, congestion, rhinorrhea, ST, epistaxis, or sinus pressure Cardiovascular: negative for chest pain or palpitations Respiratory: negative for hemoptysis, wheezing, shortness of breath,  or cough Abdominal: negative for abdominal pain, vomiting, or constipation; positive for nausea, diarrhea Dermatological: negative for rash Neurologic: negative for headache, dizziness, or syncope All other systems reviewed and are otherwise negative with the exception to those above and in the HPI.  Physical Exam: Blood pressure 138/80, pulse 82, temperature 98.6 F (37 C), temperature source Oral, weight 178 lb (80.74 kg), SpO2 98 %., Body mass index is 29.62 kg/(m^2). General: Well developed, well nourished, in no acute distress. Head: Normocephalic, atraumatic, eyes without discharge, sclera non-icteric,  nares are without discharge. Bilateral auditory canals clear, TM's are without perforation, pearly grey and translucent with reflective cone of light bilaterally. Oral cavity moist, posterior pharynx without exudate, erythema, peritonsillar abscess, or post nasal drip.  Neck: Supple. No thyromegaly. Full ROM. No lymphadenopathy. Lungs: Clear bilaterally to auscultation without wheezes, rales, or rhonchi. Breathing is unlabored. Heart: RRR with S1 S2. No murmurs, rubs, or gallops appreciated. Msk:  Strength and tone normal for age. Extremities/Skin: Warm and dry. No clubbing or cyanosis. No edema. No rashes or suspicious lesions. Neuro: Alert and oriented X 3. Moves all extremities spontaneously. Gait is normal. CNII-XII grossly in tact. Psych:  Responds to questions appropriately with a normal affect.   Labs: Results for orders placed or performed in visit on 01/19/15  POCT glycosylated hemoglobin (Hb A1C)  Result Value Ref Range   Hemoglobin A1C 7.8      ASSESSMENT AND PLAN:  81 y.o. year old male with  This chart was scribed in my presence and reviewed by me personally.    ICD-9-CM ICD-10-CM   1. Controlled type 2 diabetes mellitus without complication, without long-term current use of insulin (HCC) 250.00 E11.9 POCT glycosylated hemoglobin (Hb A1C)    Patient promises to work harder on his diet  By signing my name below, I, Moises Blood, attest that this documentation has been prepared under the direction and in the presence of Robyn Haber, MD. Electronically Signed: Moises Blood, [place]. 01/19/2015 , 8:27 AM .  Signed, Robyn Haber, MD 01/19/2015 8:27 AM

## 2015-01-19 NOTE — Patient Instructions (Signed)
The A1c is above our goal. We're shooting for 7.0 or below. He mentioned that you can do better under diet I think this will make a big difference.  I think it's reasonable to stop the testosterone cream since is given you so many a side effects. We can see how things go for while and if you're feeling worse we can resume the injections

## 2015-01-22 ENCOUNTER — Other Ambulatory Visit: Payer: Self-pay | Admitting: Family Medicine

## 2015-01-23 ENCOUNTER — Other Ambulatory Visit: Payer: Self-pay

## 2015-01-25 ENCOUNTER — Other Ambulatory Visit: Payer: Self-pay | Admitting: Family Medicine

## 2015-01-25 ENCOUNTER — Other Ambulatory Visit (INDEPENDENT_AMBULATORY_CARE_PROVIDER_SITE_OTHER): Payer: Medicare Other

## 2015-01-25 DIAGNOSIS — E1165 Type 2 diabetes mellitus with hyperglycemia: Secondary | ICD-10-CM

## 2015-01-25 LAB — BASIC METABOLIC PANEL
BUN: 20 mg/dL (ref 6–23)
CALCIUM: 10.1 mg/dL (ref 8.4–10.5)
CO2: 28 meq/L (ref 19–32)
CREATININE: 1.29 mg/dL (ref 0.40–1.50)
Chloride: 104 mEq/L (ref 96–112)
GFR: 56.89 mL/min — ABNORMAL LOW (ref >60.00)
GLUCOSE: 172 mg/dL — AB (ref 70–99)
Potassium: 5.1 mEq/L (ref 3.5–5.1)
Sodium: 139 mEq/L (ref 135–145)

## 2015-01-25 MED ORDER — TAMSULOSIN HCL 0.4 MG PO CAPS: ORAL_CAPSULE | ORAL | Status: DC

## 2015-01-26 LAB — FRUCTOSAMINE: FRUCTOSAMINE: 290 umol/L — AB (ref 0–285)

## 2015-01-30 ENCOUNTER — Ambulatory Visit (INDEPENDENT_AMBULATORY_CARE_PROVIDER_SITE_OTHER): Payer: 59 | Admitting: Endocrinology

## 2015-01-30 ENCOUNTER — Encounter: Payer: Self-pay | Admitting: Endocrinology

## 2015-01-30 VITALS — BP 130/78 | HR 72 | Temp 98.7°F | Resp 14 | Ht 65.0 in | Wt 178.0 lb

## 2015-01-30 DIAGNOSIS — E1165 Type 2 diabetes mellitus with hyperglycemia: Secondary | ICD-10-CM

## 2015-01-30 MED ORDER — METFORMIN HCL ER 750 MG PO TB24: 1500.0000 mg | ORAL_TABLET | Freq: Every day | ORAL | Status: DC

## 2015-01-30 NOTE — Progress Notes (Signed)
Patient ID: Zachary Cook, male   DOB: 03-30-1932, 81 y.o.   MRN: [id number]           Reason for Appointment: Follow-Up for Type 2 Diabetes  Referring physician: Lauenstein  History of Present Illness:          Diagnosis: Type 2 diabetes mellitus, date of diagnosis: 2005       Past history: He was started on metformin when he was found to have diabetes on routine lab work. Details of this are not available He thinks his blood sugars had been fairly well controlled for several years with metformin alone but no records are available He has had a couple of different physicians and has not always been followed consistently for his diabetes  Blood sugars had been significantly higher in the 4 months prior to his consultation in 10/15 and his A1c had gone up to 12% with his regimen of glipizide and metformin  He was given a trial of Invokana but this did not improve his blood sugars  Recent history:   He has been on Victoza since 10/15 and this was restarted in 1/16, subsequently tolerated this better with the 1.2 mg dosage. He is also back on metformin 1 g twice a day but now he says that he has occasional diarrhea  Although his A1c is still relatively high at 7.8 with his PCP earlier this month his fructosamine is supper normal at 290 now  Current blood sugar patterns and problems identified:  He is checking blood sugars somewhat sporadically and mostly before his first meal; these readings are fairly good now  On his visit in 10/16 his Amaryl was increased by 2 mg in the evenings  Blood sugars appear to be higher sporadically at bedtime but he has only one reading, did have a snack right before testing   Previously doing exercise on his treadmill, none recently    tolerating Victoza well  Usually not eating breakfast and his diet is usually fairly good       Oral hypoglycemic drugs the patient is taking are: Metformin 1 g 2x a day,  glipizide ER 5 mg once a day    Side effects  from medications have been: ?   Glucose monitoring:  done 0.5 times a day         Glucometer: Accu-Chek       Blood Glucose readings  Recently readings are checked mostly in the mornings  Mean values apply above for all meters except median for One Touch  PRE-MEAL  morning  Lunch Dinner Bedtime Overall  Glucose range:  90-128    230   Mean/median:      124     Self-care: The diet that the patient has been following is: None, usually low fat      Meals: 2 meals per day, dinner 6-7 . Breakfast is usually none; sometimes has bran flakes; fried food 1/7 days a week           Exercise:   walking on treadmill 15 min, unable to recently Dietician visit, most recent: None.               Weight history: Previous range  175-205  Wt Readings from Last 3 Encounters:  01/30/15 178 lb (80.74 kg)  01/19/15 178 lb (80.74 kg)  12/19/14 184 lb 3.2 oz (83.553 kg)    Glycemic control:   Lab Results  Component Value Date   HGBA1C 7.8 01/19/2015   HGBA1C  8.0* 12/14/2014   HGBA1C 6.6* 09/13/2014   Lab Results  Component Value Date   MICROALBUR 18.5* 09/13/2014   LDLCALC 85 12/02/2013   CREATININE 1.29 01/25/2015    Lab on 01/25/2015  Component Date Value Ref Range Status  . Sodium 01/25/2015 139  135 - 145 mEq/L Final  . Potassium 01/25/2015 5.1  3.5 - 5.1 mEq/L Final  . Chloride 01/25/2015 104  96 - 112 mEq/L Final  . CO2 01/25/2015 28  19 - 32 mEq/L Final  . Glucose, Bld 01/25/2015 172* 70 - 99 mg/dL Final  . BUN 01/25/2015 20  6 - 23 mg/dL Final  . Creatinine, Ser 01/25/2015 1.29  0.40 - 1.50 mg/dL Final  . Calcium 01/25/2015 10.1  8.4 - 10.5 mg/dL Final  . GFR 01/25/2015 56.89* >60.00 mL/min Final  . Fructosamine 01/25/2015 290* 0 - 285 umol/L Final   Comment: Published reference interval for apparently healthy subjects between age 79 and 3 is 50 - 285 umol/L and in a poorly controlled diabetic population is 228 - 563 umol/L with a mean of 396 umol/L.         Medication  List       This list is accurate as of: 01/30/15 11:59 PM.  Always use your most recent med list.               ACCU-CHEK NANO SMARTVIEW W/DEVICE Kit     aspirin 81 MG tablet  Take 81 mg by mouth daily.     esomeprazole 40 MG capsule  Commonly known as:  NEXIUM  TAKE ONE CAPSULE BY MOUTH DAILY     glimepiride 2 MG tablet  Commonly known as:  AMARYL  Take 2 tablets (4 mg total) by mouth daily before supper.     glucose blood test strip  Commonly known as:  ACCU-CHEK SMARTVIEW  Test blood sugar once daily     Insulin Pen Needle 31G X 5 MM Misc  Use one per day with Victoza     levothyroxine 137 MCG tablet  Commonly known as:  SYNTHROID, LEVOTHROID  TAKE 1 TABLET BY MOUTH EVERY MORNING BEFORE BREAKFAST     metFORMIN 750 MG 24 hr tablet  Commonly known as:  GLUCOPHAGE-XR  Take 2 tablets (1,500 mg total) by mouth daily with breakfast.     simvastatin 40 MG tablet  Commonly known as:  ZOCOR  Take 1 tablet (40 mg total) by mouth at bedtime.     tamsulosin 0.4 MG Caps capsule  Commonly known as:  FLOMAX  TAKE ONE CAPSULE BY MOUTH EVERY DAY     VICTOZA 18 MG/3ML Sopn  Generic drug:  Liraglutide  INJECT 1.2 MG UNDER THE SKIN DAILY        Allergies: No Known Allergies  Past Medical History  Diagnosis Date  . Diabetes mellitus without complication [facility])     Past Surgical History  Procedure Laterality Date  . Vasectomy      Family History  Problem Relation Age of Onset  . Heart disease Mother   . Diabetes Mother   . Cancer Father     Social History:  reports that he has never smoked. He has never used smokeless tobacco. He reports that he does not drink alcohol or use illicit drugs.    Review of Systems   RENAL insufficiency: His creatinine was transiently higher previously  Creatinine is back to normal  Lab Results  Component Value Date   CREATININE 1.29 01/25/2015  HYPERTENSION: His blood pressure  has been controlled with  amlodipine, this  is not listed on his medications currently    Most recent eye exam was 9/15       Lipids: Most recent labs as below, currently taking 40 mg Zocor       Lab Results  Component Value Date   CHOL 191 12/14/2014   HDL 42.10 12/14/2014   LDLCALC 85 12/02/2013   LDLDIRECT 125.0 12/14/2014   TRIG 251.0* 12/14/2014   CHOLHDL 5 12/14/2014                  Thyroid:  He thinks he was found to have hypothyroidism about the year 2000 on routine labs without symptoms. Apparently he did not  feel any different with taking the thyroid supplement.  His TSH record indicates that it had been previously consistently low before his consultation here  His dose had been progressively reduced and  he is on 137 g per day with more consistent control. He says that he does not feel as weak and has good endurance, no heat or cold intolerance or shakiness Labs as follows:  Lab Results  Component Value Date   TSH 1.38 12/14/2014   TSH 2.63 06/14/2014   TSH 0.23* 02/21/2014   FREET4 0.94 12/14/2014   FREET4 0.99 06/14/2014   FREET4 1.47 02/21/2014       He has been told to have hypogonadism of unclear etiology for the last few years. He was switched from the topical preparation to the injections  But lately has not been on any medications  He has been followed by PCP  He does not complain of any fatigue and has been reluctant to start the AndroGel again because he thinks it was causing diarrhea last month.    Lab Results  Component Value Date   TESTOSTERONE 174.72* 12/14/2014     LABS:  Lab on 01/25/2015  Component Date Value Ref Range Status  . Sodium 01/25/2015 139  135 - 145 mEq/L Final  . Potassium 01/25/2015 5.1  3.5 - 5.1 mEq/L Final  . Chloride 01/25/2015 104  96 - 112 mEq/L Final  . CO2 01/25/2015 28  19 - 32 mEq/L Final  . Glucose, Bld 01/25/2015 172* 70 - 99 mg/dL Final  . BUN 01/25/2015 20  6 - 23 mg/dL Final  . Creatinine, Ser 01/25/2015 1.29  0.40 - 1.50 mg/dL Final  . Calcium  01/25/2015 10.1  8.4 - 10.5 mg/dL Final  . GFR 01/25/2015 56.89* >60.00 mL/min Final  . Fructosamine 01/25/2015 290* 0 - 285 umol/L Final   Comment: Published reference interval for apparently healthy subjects between age 64 and 76 is 59 - 285 umol/L and in a poorly controlled diabetic population is 228 - 563 umol/L with a mean of 396 umol/L.     Physical Examination:  BP 130/78 mmHg  Pulse 72  Temp(Src) 98.7 F (37.1 C)  Resp 14  Ht $R'5\' 5"'gC$  (1.651 m)  Wt 178 lb (80.74 kg)  BMI 29.62 kg/m2  SpO2 96%  No pedal edema    ASSESSMENT:  Diabetes type 2, previously  with significant hyperglycemia on metformin and low-dose glipizide With  Victoza he blood sugars had improved significantlybut blood sugars have been more difficult to control recently With increasing his Amaryl his blood sugars appear to be getting back to normal However he is not checking readings after meals as discussed Also he has not been exercising much lately He appears to have some  intermittent diarrhea probably related to metformin, taking regular metformin 1 g twice a day   HYPERTENSION: Blood pressure is fairly good   HYPOGONADISM:  he has significant hypogonadism but does not want to try any medications as he feels fairly good right now and may be find to observe only considering his age  PLAN:   He will continue same medications for diabetes except try metformin ER instead of regular metformin  Start exercise  Some more readings after meals rather than the morning    recheck thyroid levels periodically    Patient Instructions  Check blood sugars on waking up 3  times a week Also check blood sugars about 2 hours after a meal and do this after different meals by rotation MORE AFTER DINNER  Recommended blood sugar levels on waking up is 90-130 and about 2 hours after meal is 130-160  Please bring your blood sugar monitor to each visit, thank you  NEW METFORIMIN 2 AT [facility] 01/31/2015, 9:04 AM   Note: This office note was prepared with Dragon voice recognition system technology. Any transcriptional errors that result from this process are unintentional. back to normal

## 2015-01-30 NOTE — Patient Instructions (Signed)
Check blood sugars on waking up 3  times a week Also check blood sugars about 2 hours after a meal and do this after different meals by rotation MORE AFTER DINNER  Recommended blood sugar levels on waking up is 90-130 and about 2 hours after meal is 130-160  Please bring your blood sugar monitor to each visit, thank you  NEW METFORIMIN 2 AT [facility]

## 2015-01-31 ENCOUNTER — Telehealth: Payer: Self-pay | Admitting: *Deleted

## 2015-01-31 ENCOUNTER — Other Ambulatory Visit: Payer: Self-pay | Admitting: *Deleted

## 2015-01-31 NOTE — Telephone Encounter (Signed)
Noted  

## 2015-01-31 NOTE — Telephone Encounter (Signed)
Mr. Zachary Cook called back after reviewing is AVS from both you and Dr. Joseph Art. On 06/17/14 you told him to stop lisinopril and take 1/2 tablet of amlodipine. On 09/16/14 you told him to continue 1/2 tablet of amlodipine. On 09/18/14 Dr. Joseph Art advise stopping amlodipine because his blood pressure had normalized.  You mentioned in one of your notes that lisinopril made his creatinine go up.

## 2015-02-06 ENCOUNTER — Encounter: Payer: Self-pay | Admitting: Family Medicine

## 2015-02-13 ENCOUNTER — Other Ambulatory Visit: Payer: Self-pay | Admitting: *Deleted

## 2015-02-13 MED ORDER — LIRAGLUTIDE 18 MG/3ML ~~LOC~~ SOPN: PEN_INJECTOR | SUBCUTANEOUS | Status: DC

## 2015-04-17 ENCOUNTER — Other Ambulatory Visit: Payer: Self-pay | Admitting: Family Medicine

## 2015-05-01 ENCOUNTER — Ambulatory Visit (INDEPENDENT_AMBULATORY_CARE_PROVIDER_SITE_OTHER): Payer: 59 | Admitting: Endocrinology

## 2015-05-01 ENCOUNTER — Encounter: Payer: Self-pay | Admitting: Endocrinology

## 2015-05-01 VITALS — BP 128/82 | HR 71 | Temp 97.9°F | Resp 14 | Ht 65.0 in | Wt 178.4 lb

## 2015-05-01 DIAGNOSIS — E1165 Type 2 diabetes mellitus with hyperglycemia: Secondary | ICD-10-CM

## 2015-05-01 DIAGNOSIS — E785 Hyperlipidemia, unspecified: Secondary | ICD-10-CM | POA: Diagnosis not present

## 2015-05-01 DIAGNOSIS — E119 Type 2 diabetes mellitus without complications: Secondary | ICD-10-CM | POA: Diagnosis not present

## 2015-05-01 DIAGNOSIS — E038 Other specified hypothyroidism: Secondary | ICD-10-CM | POA: Diagnosis not present

## 2015-05-01 LAB — COMPREHENSIVE METABOLIC PANEL
ALBUMIN: 4.3 g/dL (ref 3.5–5.2)
ALT: 13 U/L (ref 0–53)
AST: 20 U/L (ref 0–37)
Alkaline Phosphatase: 54 U/L (ref 39–117)
BILIRUBIN TOTAL: 0.9 mg/dL (ref 0.2–1.2)
BUN: 22 mg/dL (ref 6–23)
CALCIUM: 9.8 mg/dL (ref 8.4–10.5)
CO2: 27 mEq/L (ref 19–32)
CREATININE: 1.28 mg/dL (ref 0.40–1.50)
Chloride: 102 mEq/L (ref 96–112)
GFR: 57.36 mL/min — ABNORMAL LOW (ref >60.00)
Glucose, Bld: 154 mg/dL — ABNORMAL HIGH (ref 70–99)
Potassium: 4 mEq/L (ref 3.5–5.1)
SODIUM: 139 meq/L (ref 135–145)
Total Protein: 7.6 g/dL (ref 6.0–8.3)

## 2015-05-01 LAB — T4, FREE: Free T4: 1.57 ng/dL (ref 0.60–1.60)

## 2015-05-01 LAB — LIPID PANEL
CHOL/HDL RATIO: 4
Cholesterol: 163 mg/dL (ref 0–200)
HDL: 40 mg/dL (ref >39.00)
LDL Cholesterol: 103 mg/dL — ABNORMAL HIGH (ref 0–99)
NONHDL: 123.27
Triglycerides: 101 mg/dL (ref 0.0–149.0)
VLDL: 20.2 mg/dL (ref 0.0–40.0)

## 2015-05-01 LAB — TSH: TSH: 1.15 u[IU]/mL (ref 0.35–4.50)

## 2015-05-01 LAB — POCT GLYCOSYLATED HEMOGLOBIN (HGB A1C): Hemoglobin A1C: 6.9

## 2015-05-01 NOTE — Progress Notes (Signed)
Patient ID: Zachary Cook, male   DOB: 1932/08/21, 82 y.o.   MRN: [id number]           Reason for Appointment: Follow-Up    History of Present Illness:          Diagnosis: Type 2 diabetes mellitus, date of diagnosis: 2005       Past history: He was started on metformin when he was found to have diabetes on routine lab work. Details of this are not available He thinks his blood sugars had been fairly well controlled for several years with metformin alone but no records are available He has had a couple of different physicians and has not always been followed consistently for his diabetes  Blood sugars had been significantly higher in the 4 months prior to his consultation in 10/15 and his A1c had gone up to 12% with his regimen of glipizide and metformin  He was given a trial of Invokana but this did not improve his blood sugars  Recent history:    Non-insulin hypoglycemic drugs the patient is taking are: Metformin ER 750 mg, 2 tablets daily, Amaryl 4 mg at supper, Victoza 1.2 mg daily  He has been on Victoza since 10/15 and this was restarted in 1/16, and initially had difficulty with nausea  He is also now on metformin ER 200 mg a day instead of the regular metformin 1 g twice a day which is causing less diarrhea  His blood sugars appear to be much improved with A1c below 7% now  Current blood sugar patterns and problems identified:  He is checking blood sugars mostly in the mornings before eating despite reminders to check after meals  Most of his blood sugars are near normal  He has done well with starting exercise regularly on his treadmill as recommended   tolerating Victoza well  Usually not eating breakfast and his diet is usually fairly good         Glucose monitoring:  done 0.5 times a day         Glucometer: Accu-Chek       Blood Glucose readings  Recently readings are checked mostly in the mornings  Mean values apply above for all meters except median for One  Touch  PRE-MEAL Fasting Lunch Dinner Bedtime Overall  Glucose range:  114-156       Mean/median:      141     Self-care: The diet that the patient has been following is: None, usually low fat      Meals: 2 meals per day, dinner 6-7 . Breakfast is usually none; sometimes has bran flakes; fried food occasionally         Exercise:   walking on treadmill 15-30 min, 3/7 days Dietician visit, most recent: None.               Weight history: Previous range  175-205  Wt Readings from Last 3 Encounters:  05/01/15 178 lb 6.4 oz (80.922 kg)  01/30/15 178 lb (80.74 kg)  01/19/15 178 lb (80.74 kg)    Glycemic control:   Lab Results  Component Value Date   HGBA1C 6.9 05/01/2015   HGBA1C 7.8 01/19/2015   HGBA1C 7.8* 01/19/2015   Lab Results  Component Value Date   MICROALBUR 18.5* 09/13/2014   [place] 85 12/02/2013   CREATININE 1.29 01/25/2015    Office Visit on 05/01/2015  Component Date Value Ref Range Status  . Hemoglobin A1C 05/01/2015 6.9   Final  Medication List       This list is accurate as of: 05/01/15  2:25 PM.  Always use your most recent med list.               ACCU-CHEK NANO SMARTVIEW w/Device Kit     aspirin 81 MG tablet  Take 81 mg by mouth daily.     esomeprazole 40 MG capsule  Commonly known as:  NEXIUM  TAKE 1 CAPSULE BY MOUTH DAILY     glimepiride 2 MG tablet  Commonly known as:  AMARYL  Take 2 tablets (4 mg total) by mouth daily before supper.     glucose blood test strip  Commonly known as:  ACCU-CHEK SMARTVIEW  Test blood sugar once daily     Insulin Pen Needle 31G X 5 MM Misc  Use one per day with Victoza     levothyroxine 137 MCG tablet  Commonly known as:  SYNTHROID, LEVOTHROID  TAKE 1 TABLET BY MOUTH EVERY MORNING BEFORE BREAKFAST     Liraglutide 18 MG/3ML Sopn  Commonly known as:  VICTOZA  INJECT 1.2 MG UNDER THE SKIN DAILY     metFORMIN 750 MG 24 hr tablet  Commonly known as:  GLUCOPHAGE-XR  Take 2 tablets (1,500 mg  total) by mouth daily with breakfast.     simvastatin 40 MG tablet  Commonly known as:  ZOCOR  Take 1 tablet (40 mg total) by mouth at bedtime.     tamsulosin 0.4 MG Caps capsule  Commonly known as:  FLOMAX  TAKE ONE CAPSULE BY MOUTH EVERY DAY        Allergies: No Known Allergies  Past Medical History  Diagnosis Date  . Diabetes mellitus without complication [facility])     Past Surgical History  Procedure Laterality Date  . Vasectomy      Family History  Problem Relation Age of Onset  . Heart disease Mother   . Diabetes Mother   . Cancer Father     Social History:  reports that he has never smoked. He has never used smokeless tobacco. He reports that he does not drink alcohol or use illicit drugs.    Review of Systems   RENAL insufficiency: His creatinine was transiently higher previously  Creatinine is back to normal  Lab Results  Component Value Date   CREATININE 1.29 01/25/2015    HYPERTENSION: His blood pressure  has been controlled with  amlodipine, this is not listed on his medications currently    Most recent eye exam was 9/15       Lipids: Most recent labs as below, currently taking 40 mg Zocor       Lab Results  Component Value Date   CHOL 191 12/14/2014   HDL 42.10 12/14/2014   LDLCALC 85 12/02/2013   LDLDIRECT 125.0 12/14/2014   TRIG 251.0* 12/14/2014   CHOLHDL 5 12/14/2014                  Thyroid:  He thinks he was found to have hypothyroidism about the year 2000 on routine labs without symptoms. Apparently he did not  feel any different with taking the thyroid supplement.  His TSH record indicates that it had been previously consistently low before his consultation here His dose had been progressively reduced and  he is on 137 g per day with more consistent control.  He feels quite normal with his energy level  Labs as follows:  Lab Results  Component Value Date   TSH  1.38 12/14/2014   TSH 2.63 06/14/2014   TSH 0.23* 02/21/2014    FREET4 0.94 12/14/2014   FREET4 0.99 06/14/2014   FREET4 1.47 02/21/2014       He has been told to have hypogonadism of unclear etiology for the last few years.  For some time has not been on any medications but has been having no fatigue and has been  reluctant to start the AndroGel again    Lab Results  Component Value Date   TESTOSTERONE 174.72* 12/14/2014     LABS:  Office Visit on 05/01/2015  Component Date Value Ref Range Status  . Hemoglobin A1C 05/01/2015 6.9   Final   "top Physical Examination:  BP 128/82 mmHg  Pulse 71  Temp(Src) 97.9 F (36.6 C)  Resp 14  Ht '5\' 5"'$  (1.651 m)  Wt 178 lb 6.4 oz (80.922 kg)  BMI 29.69 kg/m2  SpO2 95%  No pedal edema    ASSESSMENT:  Diabetes type 2, BMI 30 With a 3 drug regimen including Victoza his blood sugars have been improved and now A1c below 7% He has also done better with exercise regimen as recommended Tolerating Victoza and also metformin in the form of extended release preparation  HYPOTHYROIDISM: Needs follow-up levels  PLAN:   He will try to do some readings after evening meal rather than just before his first meal  Consistent exercise  Call if blood sugars consistently high after meals  Will review thyroid levels when available  Check lipid levels   Patient Instructions  Check blood sugars on waking up 3  times a week Also check blood sugars about 2 hours after a meal and do this after different meals by rotation  Recommended blood sugar levels on waking up is 90-130 and about 2 hours after meal is 130-160  Please bring your blood sugar monitor to each visit, thank you         [facility] 05/01/2015, 2:25 PM   Note: This office note was prepared with Dragon voice recognition system technology. Any transcriptional errors that result from this process are unintentional.

## 2015-05-01 NOTE — Patient Instructions (Signed)
Check blood sugars on waking up 3  times a week  Also check blood sugars about 2 hours after a meal and do this after different meals by rotation  Recommended blood sugar levels on waking up is 90-130 and about 2 hours after meal is 130-160  Please bring your blood sugar monitor to each visit, thank you  

## 2015-05-03 NOTE — Progress Notes (Signed)
Quick Note:  Please let patient know that the thyroid is okay, cholesterol result is improved, continue same medications   ______

## 2015-06-19 ENCOUNTER — Other Ambulatory Visit: Payer: Self-pay | Admitting: Endocrinology

## 2015-07-03 ENCOUNTER — Other Ambulatory Visit: Payer: Self-pay | Admitting: Endocrinology

## 2015-07-27 ENCOUNTER — Other Ambulatory Visit: Payer: Self-pay | Admitting: Endocrinology

## 2015-08-28 ENCOUNTER — Other Ambulatory Visit (INDEPENDENT_AMBULATORY_CARE_PROVIDER_SITE_OTHER): Payer: 59

## 2015-08-28 DIAGNOSIS — E119 Type 2 diabetes mellitus without complications: Secondary | ICD-10-CM | POA: Diagnosis not present

## 2015-08-28 LAB — BASIC METABOLIC PANEL
BUN: 27 mg/dL — ABNORMAL HIGH (ref 6–23)
CALCIUM: 9.5 mg/dL (ref 8.4–10.5)
CHLORIDE: 107 meq/L (ref 96–112)
CO2: 26 meq/L (ref 19–32)
Creatinine, Ser: 1.18 mg/dL (ref 0.40–1.50)
GFR: 62.96 mL/min (ref >60.00)
Glucose, Bld: 172 mg/dL — ABNORMAL HIGH (ref 70–99)
Potassium: 5.4 mEq/L — ABNORMAL HIGH (ref 3.5–5.1)
SODIUM: 141 meq/L (ref 135–145)

## 2015-08-28 LAB — MICROALBUMIN / CREATININE URINE RATIO
Creatinine,U: 91.3 mg/dL
Microalb Creat Ratio: 8.5 mg/g (ref 0.0–30.0)
Microalb, Ur: 7.8 mg/dL — ABNORMAL HIGH (ref 0.0–1.9)

## 2015-08-28 LAB — HEMOGLOBIN A1C: HEMOGLOBIN A1C: 7.5 % — AB (ref 4.6–6.5)

## 2015-08-31 ENCOUNTER — Encounter: Payer: Self-pay | Admitting: Endocrinology

## 2015-08-31 ENCOUNTER — Ambulatory Visit (INDEPENDENT_AMBULATORY_CARE_PROVIDER_SITE_OTHER): Payer: 59 | Admitting: Endocrinology

## 2015-08-31 VITALS — BP 132/70 | HR 76 | Ht 65.0 in | Wt 181.0 lb

## 2015-08-31 DIAGNOSIS — E1165 Type 2 diabetes mellitus with hyperglycemia: Secondary | ICD-10-CM

## 2015-08-31 NOTE — Patient Instructions (Signed)
Check blood sugars on waking up 2-3 times a week Also check blood sugars about 2 hours after a meal and do this after different meals by rotation  Recommended blood sugar levels on waking up is 90-130 and about 2 hours after meal is 130-160  Please bring your blood sugar monitor to each visit, thank you  

## 2015-08-31 NOTE — Progress Notes (Signed)
Patient ID: Zachary Cook, male   DOB: 25-Aug-1932, 82 y.o.   MRN: [id number]           Reason for Appointment: Follow-Up    History of Present Illness:          Diagnosis: Type 2 diabetes mellitus, date of diagnosis: 2005       Past history: He was started on metformin when he was found to have diabetes on routine lab work. Details of this are not available He thinks his blood sugars had been fairly well controlled for several years with metformin alone but no records are available He has had a couple of different physicians and has not always been followed consistently for his diabetes  Blood sugars had been significantly higher in the 4 months prior to his consultation in 10/15 and his A1c had gone up to 12% with his regimen of glipizide and metformin  He was given a trial of Invokana but this did not improve his blood sugars  Recent history:    Non-insulin hypoglycemic drugs : Metformin ER 750 mg, 2 tablets daily, Amaryl 4 mg at supper, Victoza 1.2 mg daily  He has been on Victoza since 10/15 and this was restarted in 1/16, and initially had difficulty with nausea  He is also now on metformin ER 1500 mg a day instead of the regular metformin 1 g twice a day which is causing less diarrhea  His blood sugars are relatively higher with A1c 7.5 compared to 6.9 previously  Current blood sugar patterns and problems identified:  He is checking blood sugars mostly in the mornings before eating despite reminders to check after meals  Compared to last time his blood sugars are higher and had good readings only about 3 days this month  The last 3 days he has readings over 200  He has not checked any readings even after breakfast or lunch  Although he is trying to be active he has gained 2 pounds  The only change in his diet he is done is that at suppertime instead of a meal he is eating a bowl of fruit including grapes and pineapple  He has been consistent with his metformin,  Amaryl and Victoza  Usually not eating breakfast          Glucose monitoring:  done 0.5 times a day         Glucometer: Accu-Chek       Blood Glucose readings  Recently readings are checked mostly in the mornings  Mean values apply above for all meters except median for One Touch  PRE-MEAL Fasting Lunch Dinner Bedtime Overall  Glucose range: 102-242       Mean/median: 163           Self-care: The diet that the patient has been following is: None, usually low fat      Meals: 2 meals per day, dinner 6-7 . Breakfast is usually none; sometimes has bran flakes; fried food occasionally         Exercise:   walking on treadmill 15-30 min, 3/7 days Dietician visit, most recent: None.               Weight history: Previous range  175-205  Wt Readings from Last 3 Encounters:  08/31/15 181 lb (82.101 kg)  05/01/15 178 lb 6.4 oz (80.922 kg)  01/30/15 178 lb (80.74 kg)    Glycemic control:   Lab Results  Component Value Date   HGBA1C 7.5* 08/28/2015  HGBA1C 6.9 05/01/2015   HGBA1C 7.8 01/19/2015   Lab Results  Component Value Date   MICROALBUR 7.8* 08/28/2015   LDLCALC 103* 05/01/2015   CREATININE 1.18 08/28/2015    Lab on 08/28/2015  Component Date Value Ref Range Status  . Hgb A1c MFr Bld 08/28/2015 7.5* 4.6 - 6.5 % Final   Glycemic Control Guidelines for People with Diabetes:Non Diabetic:  <6%Goal of Therapy: <7%Additional Action Suggested:  >8%   . Sodium 08/28/2015 141  135 - 145 mEq/L Final  . Potassium 08/28/2015 5.4* 3.5 - 5.1 mEq/L Final  . Chloride 08/28/2015 107  96 - 112 mEq/L Final  . CO2 08/28/2015 26  19 - 32 mEq/L Final  . Glucose, Bld 08/28/2015 172* 70 - 99 mg/dL Final  . BUN 08/28/2015 27* 6 - 23 mg/dL Final  . Creatinine, Ser 08/28/2015 1.18  0.40 - 1.50 mg/dL Final  . Calcium 08/28/2015 9.5  8.4 - 10.5 mg/dL Final  . GFR 08/28/2015 62.96  >60.00 mL/min Final  . Microalb, Ur 08/28/2015 7.8* 0.0 - 1.9 mg/dL Final  . Creatinine,U 08/28/2015 91.3   Final    . Microalb Creat Ratio 08/28/2015 8.5  0.0 - 30.0 mg/g Final        Medication List       This list is accurate as of: 08/31/15  2:00 PM.  Always use your most recent med list.               ACCU-CHEK NANO SMARTVIEW w/Device Kit     aspirin 81 MG tablet  Take 81 mg by mouth daily.     B-D UF III MINI PEN NEEDLES 31G X 5 MM Misc  Generic drug:  Insulin Pen Needle  USE ONE PER DAY WITH VICTOZA     esomeprazole 40 MG capsule  Commonly known as:  NEXIUM  TAKE 1 CAPSULE BY MOUTH DAILY     glimepiride 2 MG tablet  Commonly known as:  AMARYL  TAKE 2 TABLETS(4 MG) BY MOUTH DAILY BEFORE DINNER     glucose blood test strip  Commonly known as:  ACCU-CHEK SMARTVIEW  Test blood sugar once daily     levothyroxine 137 MCG tablet  Commonly known as:  SYNTHROID, LEVOTHROID  TAKE 1 TABLET BY MOUTH EVERY MORNING BEFORE BREAKFAST     metFORMIN 750 MG 24 hr tablet  Commonly known as:  GLUCOPHAGE-XR  TAKE 2 TABLETS(1500 MG) BY MOUTH DAILY WITH BREAKFAST     simvastatin 40 MG tablet  Commonly known as:  ZOCOR  Take 1 tablet (40 mg total) by mouth at bedtime.     tamsulosin 0.4 MG Caps capsule  Commonly known as:  FLOMAX  TAKE ONE CAPSULE BY MOUTH EVERY DAY     VICTOZA 18 MG/3ML Sopn  Generic drug:  Liraglutide  ADMINISTER 1.2 MG UNDER THE SKIN DAILY        Allergies: No Known Allergies  Past Medical History  Diagnosis Date  . Diabetes mellitus without complication [facility])     Past Surgical History  Procedure Laterality Date  . Vasectomy      Family History  Problem Relation Age of Onset  . Heart disease Mother   . Diabetes Mother   . Cancer Father     Social History:  reports that he has never smoked. He has never used smokeless tobacco. He reports that he does not drink alcohol or use illicit drugs.    Review of Systems   RENAL insufficiency: His creatinine was  transiently higher previously  Creatinine is back to normal  Lab Results  Component Value  Date   CREATININE 1.18 08/28/2015    HYPERTENSION: His blood pressure  has been controlled with  amlodipine, this is not listed on his medications currently    Most recent eye exam was 9/15       Lipids: Most recent labs as below, currently taking 40 mg Zocor       Lab Results  Component Value Date   CHOL 163 05/01/2015   HDL 40.00 05/01/2015   LDLCALC 103* 05/01/2015   LDLDIRECT 125.0 12/14/2014   TRIG 101.0 05/01/2015   CHOLHDL 4 05/01/2015                  Thyroid:  He thinks he was found to have hypothyroidism about the year 2000 on routine labs without symptoms. Apparently he did not  feel any different with taking the thyroid supplement.  His TSH record indicates that it had been previously consistently low before his consultation here  His dose had been progressively reduced and  he is on 137 g per day with consistent control.  He feels quite normal with his energy level  Labs as follows:  Lab Results  Component Value Date   TSH 1.15 05/01/2015   TSH 1.38 12/14/2014   TSH 2.63 06/14/2014   FREET4 1.57 05/01/2015   FREET4 0.94 12/14/2014   FREET4 0.99 06/14/2014       He has been told to have hypogonadism of unclear etiology for the last few years.  For some time has not been on any medications but has been having no fatigue and has been  reluctant to start the AndroGel again    Lab Results  Component Value Date   TESTOSTERONE 174.72* 12/14/2014     LABS:  Lab on 08/28/2015  Component Date Value Ref Range Status  . Hgb A1c MFr Bld 08/28/2015 7.5* 4.6 - 6.5 % Final   Glycemic Control Guidelines for People with Diabetes:Non Diabetic:  <6%Goal of Therapy: <7%Additional Action Suggested:  >8%   . Sodium 08/28/2015 141  135 - 145 mEq/L Final  . Potassium 08/28/2015 5.4* 3.5 - 5.1 mEq/L Final  . Chloride 08/28/2015 107  96 - 112 mEq/L Final  . CO2 08/28/2015 26  19 - 32 mEq/L Final  . Glucose, Bld 08/28/2015 172* 70 - 99 mg/dL Final  . BUN 08/28/2015  27* 6 - 23 mg/dL Final  . Creatinine, Ser 08/28/2015 1.18  0.40 - 1.50 mg/dL Final  . Calcium 08/28/2015 9.5  8.4 - 10.5 mg/dL Final  . GFR 08/28/2015 62.96  >60.00 mL/min Final  . Microalb, Ur 08/28/2015 7.8* 0.0 - 1.9 mg/dL Final  . Creatinine,U 08/28/2015 91.3   Final  . Microalb Creat Ratio 08/28/2015 8.5  0.0 - 30.0 mg/g Final    Physical Examination:  BP 132/70 mmHg  Pulse 76  Ht _0  (1.651 m)  Wt 181 lb (82.101 kg)  BMI 30.12 kg/m2  SpO2 93%  No pedal edema    ASSESSMENT:  Diabetes type 2, BMI 30 See history of present illness for detailed discussion of current diabetes management, blood sugar patterns and problems identified His blood sugars are getting higher and A1c is increased to 7.5 However more recently he has had higher fasting readings especially the last 3 days when they have been over 200 Although this may be related to his eating unbalanced meals such as a large fruit bowl in the evening not  clear if he is having some progression of his diabetes He also has gained about 2 pounds  He is on a 3 drug regimen including Victoza 1.2 mg and 1500 mg of metformin ER   HYPOTHYROIDISM: Needs follow-up levels on the next visit  PLAN:   He will try to do some readings after evening meal rather than just before his first meal  Avoid fruit in the evening and have balanced meals with protein and low-fat content and no more than half a cup of fruit.  If his blood sugars are not improved next week he will call  Discussed possibly using Invokana and discussed how this works to improve blood sugar control and weight  If he has continued high blood sugars will start Invokana and possibly reduce Amaryl to 2 mg   Patient Instructions  Check blood sugars on waking up 23- times a week Also check blood sugars about 2 hours after a meal and do this after different meals by rotation  Recommended blood sugar levels on waking up is 90-130 and about 2 hours after meal is  130-160  Please bring your blood sugar monitor to each visit, thank you        [facility] 08/31/2015, 2:00 PM   Note: This office note was prepared with Dragon voice recognition system technology. Any transcriptional errors that result from this process are unintentional.

## 2015-09-01 ENCOUNTER — Other Ambulatory Visit: Payer: Self-pay | Admitting: Family Medicine

## 2015-10-03 ENCOUNTER — Other Ambulatory Visit: Payer: Self-pay | Admitting: Family Medicine

## 2015-11-03 ENCOUNTER — Other Ambulatory Visit: Payer: Self-pay | Admitting: Endocrinology

## 2015-11-16 ENCOUNTER — Other Ambulatory Visit: Payer: Self-pay | Admitting: Endocrinology

## 2015-11-16 ENCOUNTER — Other Ambulatory Visit: Payer: Self-pay | Admitting: Emergency Medicine

## 2015-11-21 ENCOUNTER — Other Ambulatory Visit: Payer: Self-pay | Admitting: Emergency Medicine

## 2015-11-22 ENCOUNTER — Other Ambulatory Visit: Payer: Self-pay | Admitting: Endocrinology

## 2015-11-28 ENCOUNTER — Other Ambulatory Visit (INDEPENDENT_AMBULATORY_CARE_PROVIDER_SITE_OTHER): Payer: 59

## 2015-11-28 DIAGNOSIS — E1165 Type 2 diabetes mellitus with hyperglycemia: Secondary | ICD-10-CM

## 2015-11-28 LAB — TSH: TSH: 0.21 u[IU]/mL — ABNORMAL LOW (ref 0.35–4.50)

## 2015-11-28 LAB — HEMOGLOBIN A1C: HEMOGLOBIN A1C: 7.2 % — AB (ref 4.6–6.5)

## 2015-11-28 LAB — BASIC METABOLIC PANEL
BUN: 23 mg/dL (ref 6–23)
CALCIUM: 9.3 mg/dL (ref 8.4–10.5)
CO2: 27 meq/L (ref 19–32)
Chloride: 105 mEq/L (ref 96–112)
Creatinine, Ser: 1.28 mg/dL (ref 0.40–1.50)
GFR: 57.28 mL/min — AB (ref >60.00)
Glucose, Bld: 209 mg/dL — ABNORMAL HIGH (ref 70–99)
POTASSIUM: 5.1 meq/L (ref 3.5–5.1)
SODIUM: 137 meq/L (ref 135–145)

## 2015-12-01 ENCOUNTER — Encounter: Payer: Self-pay | Admitting: Endocrinology

## 2015-12-01 ENCOUNTER — Ambulatory Visit (INDEPENDENT_AMBULATORY_CARE_PROVIDER_SITE_OTHER): Payer: 59 | Admitting: Endocrinology

## 2015-12-01 VITALS — BP 118/80 | HR 83 | Temp 98.0°F | Resp 16 | Ht 65.0 in | Wt 178.2 lb

## 2015-12-01 DIAGNOSIS — E038 Other specified hypothyroidism: Secondary | ICD-10-CM | POA: Diagnosis not present

## 2015-12-01 DIAGNOSIS — E785 Hyperlipidemia, unspecified: Secondary | ICD-10-CM

## 2015-12-01 DIAGNOSIS — E1165 Type 2 diabetes mellitus with hyperglycemia: Secondary | ICD-10-CM

## 2015-12-01 DIAGNOSIS — E063 Autoimmune thyroiditis: Secondary | ICD-10-CM

## 2015-12-01 DIAGNOSIS — I10 Essential (primary) hypertension: Secondary | ICD-10-CM | POA: Diagnosis not present

## 2015-12-01 MED ORDER — LEVOTHYROXINE SODIUM 125 MCG PO TABS: 125.0000 ug | ORAL_TABLET | Freq: Every day | ORAL | 4 refills | Status: DC

## 2015-12-01 NOTE — Patient Instructions (Signed)
Check blood sugars on waking up 3x weekly  Also check blood sugars about 2 hours after a meal and do this after different meals by rotation  Recommended blood sugar levels on waking up is 90-130 and about 2 hours after meal is 130-160  Please bring your blood sugar monitor to each visit, thank you  Victoza: add 5 clicks and if ok go to 1.8mg    Resume walking

## 2015-12-01 NOTE — Progress Notes (Signed)
Patient ID: Zachary Cook, male   DOB: 18-Mar-1932, 82 y.o.   MRN: [id number]           Reason for Appointment: Follow-Up    History of Present Illness:          Diagnosis: Type 2 diabetes mellitus, date of diagnosis: 2005       Past history: He was started on metformin when he was found to have diabetes on routine lab work. Details of this are not available He thinks his blood sugars had been fairly well controlled for several years with metformin alone but no records are available He has had a couple of different physicians and has not always been followed consistently for his diabetes  Blood sugars had been significantly higher in the 4 months prior to his consultation in 10/15 and his A1c had gone up to 12% with his regimen of glipizide and metformin  He was given a trial of Invokana but this did not improve his blood sugars  Recent history:    Non-insulin hypoglycemic drugs : Metformin ER 750 mg, 2 tablets daily, Amaryl 4 mg at supper, Victoza 1.2 mg daily  He has been on Victoza since 10/15 and this was restarted in 1/16, and initially had difficulty with nausea  He is also now on metformin ER 1500 mg a day instead of the regular metformin 1 g twice a day which is better tolerated  His blood sugars are slightly better controlled, A1c 7.2, Previous range 6.6-8.0  Current blood sugar patterns and problems identified:  He is checking blood sugars mostly in the mornings again and only occasionally after breakfast and lunch  On his last visit he was told to try improving his diet with cutting back on fruits  However his fasting readings are still mostly high  Highest reading 275 after a _0 7   Self-care: The diet that the patient has been following is: None, usually low fat      Meals: 2 meals per day, dinner 6-7 . Breakfast is usually none; sometimes has bran flakes at 10 am; fried food occasionally          Exercise:was walking on treadmill 15-30 min, 3/7 days Dietician visit, most recent: None.               Weight history: Previous range  175-205  Wt Readings from Last 3 Encounters:  12/01/15 178 lb 3.2 oz (80.8 kg)  08/31/15 181 lb (82.1 kg)  05/01/15 178 lb 6.4 oz (80.9 kg)    Glycemic control:   Lab Results  Component Value Date   HGBA1C 7.2 (H) 11/28/2015   HGBA1C 7.5 (H) 08/28/2015   HGBA1C 6.9 05/01/2015   Lab Results  Component Value Date  MICROALBUR 7.8 (H) 08/28/2015   LDLCALC 103 (H) 05/01/2015   CREATININE 1.28 11/28/2015    Lab on 11/28/2015  Component Date Value Ref Range Status  . Hgb A1c MFr Bld 11/28/2015 7.2* 4.6 - 6.5 % Final  . Sodium 11/28/2015 137  135 - 145 mEq/L Final  . Potassium 11/28/2015 5.1  3.5 - 5.1 mEq/L Final  . Chloride 11/28/2015 105  96 - 112 mEq/L Final  . CO2 11/28/2015 27  19 - 32 mEq/L Final  . Glucose, Bld 11/28/2015 209* 70 - 99 mg/dL Final  . BUN 11/28/2015 23  6 - 23 mg/dL Final  . Creatinine, Ser 11/28/2015 1.28  0.40 - 1.50 mg/dL Final  . Calcium 11/28/2015 9.3  8.4 - 10.5 mg/dL Final  . GFR 11/28/2015 57.28* >60.00 mL/min Final  . TSH 11/28/2015 0.21* 0.35 - 4.50 uIU/mL Final        Medication List       Accurate as of 12/01/15 11:57 AM. Always use your most recent med list.          ACCU-CHEK NANO SMARTVIEW w/Device Kit   aspirin 81 MG tablet Take 81 mg by mouth daily.   B-D UF III MINI  PEN NEEDLES 31G X 5 MM Misc Generic drug:  Insulin Pen Needle USE ONE PER DAY WITH VICTOZA   esomeprazole 40 MG capsule Commonly known as:  NEXIUM Take 40 mg by mouth daily at 12 noon.   FLUAD 0.5 ML Susy Generic drug:  Influenza Vac A&B Surf Ant Adj   glimepiride 2 MG tablet Commonly known as:  AMARYL TAKE 2 TABLETS(4 MG) BY MOUTH DAILY BEFORE DINNER   glucose blood test strip Commonly known as:  ACCU-CHEK SMARTVIEW Test blood sugar once daily   levothyroxine 125 MCG tablet Commonly known as:  SYNTHROID, LEVOTHROID Take 1 tablet (125 mcg total) by mouth daily before breakfast.   metFORMIN 750 MG 24 hr tablet Commonly known as:  GLUCOPHAGE-XR TAKE 2 TABLETS(1500 MG) BY MOUTH DAILY WITH BREAKFAST   simvastatin 40 MG tablet Commonly known as:  ZOCOR Take 1 tablet (40 mg total) by mouth at bedtime.   tamsulosin 0.4 MG Caps capsule Commonly known as:  FLOMAX TAKE ONE CAPSULE BY MOUTH EVERY DAY   VICTOZA 18 MG/3ML Sopn Generic drug:  Liraglutide ADMINISTER 1.2 MG UNDER THE SKIN DAILY       Allergies: No Known Allergies  Past Medical History:  Diagnosis Date  . Diabetes mellitus without complication [facility])     Past Surgical History:  Procedure Laterality Date  . VASECTOMY      Family History  Problem Relation Age of Onset  . Heart disease Mother   . Diabetes Mother   . Cancer Father     Social History:  reports that he has never smoked. He has never used smokeless tobacco. He reports that he does not drink alcohol or use drugs.    Review of Systems   RENAL insufficiency: His creatinine was transiently higher previously  Creatinine is back to normal  Lab Results  Component Value Date   CREATININE 1.28 11/28/2015    HYPERTENSION: His blood pressure  has been controlled with  amlodipine, this is not listed on his medications currently    Most recent eye exam was 9/15       Lipids: Most recent labs as below, currently taking 40 mg Zocor       Lab  Results  Component Value Date   CHOL 163 05/01/2015  HDL 40.00 05/01/2015   LDLCALC 103 (H) 05/01/2015   LDLDIRECT 125.0 12/14/2014   TRIG 101.0 05/01/2015   CHOLHDL 4 05/01/2015                  Thyroid:  He thinks he was found to have hypothyroidism about the year 2000 on routine labs without symptoms. Apparently he did not  feel any different with taking the thyroid supplement.   His dose previously had been progressively reduced and  he is on 137 g per day with consistent control until 9/17. His TSH is now unusually low at 0.21  He feels quite normal with his energy level   Labs as follows:  Lab Results  Component Value Date   TSH 0.21 (L) 11/28/2015   TSH 1.15 05/01/2015   TSH 1.38 12/14/2014   FREET4 1.57 05/01/2015   FREET4 0.94 12/14/2014   FREET4 0.99 06/14/2014       He has been told to have hypogonadism of unclear etiology for the last few years.  For some time has not been on any medications but has been having no fatigue and has been  reluctant to start the AndroGel again    Lab Results  Component Value Date   TESTOSTERONE 174.72 (L) 12/14/2014     LABS:  Lab on 11/28/2015  Component Date Value Ref Range Status  . Hgb A1c MFr Bld 11/28/2015 7.2* 4.6 - 6.5 % Final  . Sodium 11/28/2015 137  135 - 145 mEq/L Final  . Potassium 11/28/2015 5.1  3.5 - 5.1 mEq/L Final  . Chloride 11/28/2015 105  96 - 112 mEq/L Final  . CO2 11/28/2015 27  19 - 32 mEq/L Final  . Glucose, Bld 11/28/2015 209* 70 - 99 mg/dL Final  . BUN 11/28/2015 23  6 - 23 mg/dL Final  . Creatinine, Ser 11/28/2015 1.28  0.40 - 1.50 mg/dL Final  . Calcium 11/28/2015 9.3  8.4 - 10.5 mg/dL Final  . GFR 11/28/2015 57.28* >60.00 mL/min Final  . TSH 11/28/2015 0.21* 0.35 - 4.50 uIU/mL Final    Physical Examination:  BP 118/80   Pulse 83   Temp 98 F (36.7 C)   Resp 16   Ht 5' 5" (1.651 m)   Wt 178 lb 3.2 oz (80.8 kg)   SpO2 94%   BMI 29.65 kg/m      ASSESSMENT:  Diabetes type 2,  BMI 30 See history of present illness for detailed discussion of current diabetes management, blood sugar patterns and problems identified His blood sugars are Under fair control considering his age A1c 7.2 However his blood sugars are averaging about 150 in the morning   Also may have postprandial hyperglycemia, highest reading 275 one afternoon and postprandial readings are probably dependent on his diet Still not checking readings after his evening meal despite reminders  He is on a 3 drug regimen including Victoza 1.2 mg and 1500 mg of metformin ER He has previously tried Invokana in 2015  HYPOTHYROIDISM: Subjectively doing well but TSH is slightly low, currently asymptomatic  PLAN:   He will try to do some readings after evening meal rather than mornings  Discussed consistent diet and avoiding high carbohydrate meals and fruits and juices  Discussed blood sugar targets  Restart exercise when able to, encouraged him to walk 20 minutes up to 5 times a week  Try to increase Victoza by at least 5 clicks and if tolerated increase to 1.8 mg, showed him how to do this  on the sample pen  Consider adding Jardiance if blood sugars consistently go up  For his hypothyroidism and he will switch to 125 g daily and will need follow-up on the next visit.  May consider brand name Synthroid if TSH levels continue to fluctuate  Also needs follow-up of his lipids   Patient Instructions  Check blood sugars on waking up 3x weekly  Also check blood sugars about 2 hours after a meal and do this after different meals by rotation  Recommended blood sugar levels on waking up is 90-130 and about 2 hours after meal is 130-160  Please bring your blood sugar monitor to each visit, thank you  Victoza: add 5 clicks and if ok go to 1.58m   Resume walking     Counseling time on subjects discussed above is over 50% of today's 25 minute visit   KUMAR,AJAY 12/01/2015, 11:57 AM   Note: This  office note was prepared with [employer] agent Any transcriptional errors that result from this process are unintentional.

## 2015-12-04 ENCOUNTER — Encounter: Payer: Self-pay | Admitting: Family Medicine

## 2015-12-04 ENCOUNTER — Ambulatory Visit (INDEPENDENT_AMBULATORY_CARE_PROVIDER_SITE_OTHER): Payer: Medicare Other | Admitting: Family Medicine

## 2015-12-04 VITALS — BP 116/70 | HR 90 | Temp 98.2°F | Resp 18 | Ht 65.0 in | Wt 176.0 lb

## 2015-12-04 DIAGNOSIS — N400 Enlarged prostate without lower urinary tract symptoms: Secondary | ICD-10-CM

## 2015-12-04 DIAGNOSIS — E038 Other specified hypothyroidism: Secondary | ICD-10-CM | POA: Diagnosis not present

## 2015-12-04 DIAGNOSIS — K2270 Barrett's esophagus without dysplasia: Secondary | ICD-10-CM

## 2015-12-04 DIAGNOSIS — E119 Type 2 diabetes mellitus without complications: Secondary | ICD-10-CM | POA: Diagnosis not present

## 2015-12-04 MED ORDER — ESOMEPRAZOLE MAGNESIUM 40 MG PO CPDR: 40.0000 mg | DELAYED_RELEASE_CAPSULE | Freq: Every day | ORAL | 1 refills | Status: DC

## 2015-12-04 NOTE — Patient Instructions (Addendum)
     IF you received an x-ray today, you will receive an invoice from [facility]. Please contact [facility] at [phone] with questions or concerns regarding your invoice.   IF you received labwork today, you will receive an invoice from [employer]. Please contact [facility] at [phone] with questions or concerns regarding your invoice.   Our billing staff will not be able to assist you with questions regarding bills from these companies.  You will be contacted with the lab results as soon as they are available. The fastest way to get your results is to activate your My Chart account. Instructions are located on the last page of this paperwork. If you have not heard from [place] regarding the results in 2 weeks, please contact this office.    I will check your prostate test to make sure that is stable. No change in medications for now. I refilled your Nexium. I also referred you back to Dr. Sharlett Iles as it appears you are overdue for repeat endoscopy. Continue follow-up and lab work as planned with your endocrinologist. Follow-up with me in 6 months for physical, sooner if any new issues arise.

## 2015-12-04 NOTE — Progress Notes (Signed)
By signing my name below I, Tereasa Coop, attest that this documentation has been prepared under the direction and in the presence of [name], MD. Electonically Signed. Tereasa Coop, Scribe 12/04/2015 at 10:59 AM  Subjective:    Patient ID: Zachary Cook, male    DOB: 20-Dec-1932, 82 y.o.   MRN: [id number]  Chief Complaint  Patient presents with  . Medication Refill    NEXIUM/TRANSFER CARE    HPI Zachary Cook is a 82 y.o. male who presents to the [facility] for nexium prescription refill.  Pt's PCP, Dr Joseph Art, has retired and pt is trying to establish care with Dr Carlota Raspberry.    History of type II DM, HTN, GERD, and hypogonadism.   DM Pt takes '1500mg'$  Metformin QD and '4mg'$  amaryl at dinner. Pt also on victoza. DM is followed by endocrinologist Dr Dwyane Dee. Pt last seen by Dr Kumar 3 days ago and had orders for CMP, TSH, lipid panel, and CMP labs to be drawn prior to his next visit with Dr Dwyane Dee. Pt had TSH BMP and A1C drawn on 11/28/15 prior to visit with Dr Dwyane Dee on 12/01/15.  Hypothyroidism Pt takes 125 mcg synthroid QD. Hypothyroidism is followed by endocrinologist Dr Dwyane Dee  GERD Pt takes 40 mg nexium QD. Pt had an endoscopy in 2011 with Dr Sharlett Iles. History of Barrett's esophagus, hiatal hernia, and duodenitis. Planned for continuing GERD medications, with repeat surveillance EGD in 3 years. Pt denies having an endoscopy since 2011. GERD has been well controlled on Nexium until 2 weeks ago when pt ran out of his PPI and pt has been having reflux for the past 2 weeks.   HLD Pt takes '40mg'$  simvastatin QD. Lab Results  Component Value Date   CHOL 163 05/01/2015   HDL 40.00 05/01/2015   LDLCALC 103 (H) 05/01/2015   LDLDIRECT 125.0 12/14/2014   TRIG 101.0 05/01/2015   CHOLHDL 4 05/01/2015   Lab Results  Component Value Date   ALT 13 05/01/2015   AST 20 05/01/2015   ALKPHOS 54 05/01/2015   BILITOT 0.9 05/01/2015    BPH Pt on flomax.    Lab Results  Component Value Date   PSA 2.04 10/18/2012  Pt denies any difficultly urinating. Pt wakes up 2-3 times a night to urinate. Pt states that when he urinates he has a strong stream.  Pt reports that he ate cereal this morning.    Patient Active Problem List   Diagnosis Date Noted  . Adult onset hypothyroidism 12/20/2013  . Type II diabetes mellitus, uncontrolled ([place]) 12/20/2013  . Hypogonadism in male 12/02/2013  . Type 2 diabetes mellitus ([place]) 05/09/2011  . Hypertension 05/09/2011  . GERD (gastroesophageal reflux disease) 05/09/2011  . Hypogonadism male 05/09/2011   Past Medical History:  Diagnosis Date  . Diabetes mellitus without complication ([place])   . GERD (gastroesophageal reflux disease)    Past Surgical History:  Procedure Laterality Date  . VASECTOMY     No Known Allergies Prior to Admission medications   Medication Sig Start Date End Date Taking? Authorizing Provider  aspirin 81 MG tablet Take 81 mg by mouth daily.   Yes Historical Provider, MD  B-D UF III MINI PEN NEEDLES 31G X 5 MM MISC USE ONE PER DAY WITH VICTOZA 11/22/15  Yes Elayne Snare, MD  Blood Glucose Monitoring Suppl (ACCU-CHEK NANO SMARTVIEW) W/DEVICE KIT  12/02/13  Yes Historical Provider, MD  esomeprazole (NEXIUM) 40 MG capsule Take 40 mg by  mouth daily at 12 noon.   Yes Historical Provider, MD  FLUAD 0.5 ML SUSY  11/30/15  Yes Historical Provider, MD  glimepiride (AMARYL) 2 MG tablet TAKE 2 TABLETS(4 MG) BY MOUTH DAILY BEFORE DINNER 11/03/15  Yes Elayne Snare, MD  glucose blood (ACCU-CHEK SMARTVIEW) test strip Test blood sugar once daily 12/02/14  Yes Robyn Haber, MD  levothyroxine (SYNTHROID, LEVOTHROID) 125 MCG tablet Take 1 tablet (125 mcg total) by mouth daily before breakfast. 12/01/15  Yes Elayne Snare, MD  metFORMIN (GLUCOPHAGE-XR) 750 MG 24 hr tablet TAKE 2 TABLETS(1500 MG) BY MOUTH DAILY WITH BREAKFAST 11/03/15  Yes Elayne Snare, MD  simvastatin (ZOCOR) 40 MG tablet Take 1 tablet (40 mg  total) by mouth at bedtime. 12/20/14  Yes Robyn Haber, MD  tamsulosin (FLOMAX) 0.4 MG CAPS capsule TAKE ONE CAPSULE BY MOUTH EVERY DAY 01/25/15  Yes Robyn Haber, MD  VICTOZA 18 MG/3ML SOPN ADMINISTER 1.2 MG UNDER THE SKIN DAILY 11/16/15  Yes Elayne Snare, MD   Social History   Social History  . Marital status: Married    Spouse name: N/A  . Number of children: N/A  . Years of education: N/A   Occupational History  . Not on file.   Social History Main Topics  . Smoking status: Never Smoker  . Smokeless tobacco: Never Used  . Alcohol use No  . Drug use: No  . Sexual activity: Not on file   Other Topics Concern  . Not on file   Social History Narrative  . No narrative on file      Review of Systems  Constitutional: Negative for fatigue and unexpected weight change.  Eyes: Negative for visual disturbance.  Respiratory: Negative for cough, chest tightness and shortness of breath.   Cardiovascular: Negative for chest pain, palpitations and leg swelling.  Gastrointestinal: Negative for abdominal pain and blood in stool.       Positive for reflux  Neurological: Negative for dizziness, light-headedness and headaches.       Objective:   Physical Exam  Constitutional: He is oriented to person, place, and time. He appears well-developed and well-nourished.  HENT:  Head: Normocephalic and atraumatic.  Eyes: EOM are normal. Pupils are equal, round, and reactive to light.  Neck: No JVD present. Carotid bruit is not present.  Cardiovascular: Normal rate, regular rhythm and normal heart sounds.   No murmur heard. Pulmonary/Chest: Effort normal and breath sounds normal. He has no rales.  Genitourinary:  Genitourinary Comments: Suspect slight enlargement of prostate. Prostate non-tender with no nodules or masses appreciated.   Musculoskeletal: He exhibits no edema.  Neurological: He is alert and oriented to person, place, and time.  Skin: Skin is warm and dry.  Psychiatric:  He has a normal mood and affect.  Vitals reviewed.    Vitals:   12/04/15 0956  BP: 116/70  Pulse: 90  Resp: 18  Temp: 98.2 F (36.8 C)  TempSrc: Oral  SpO2: 95%  Weight: 176 lb (79.8 kg)  Height: '5\' 5"'$  (1.651 m)   Results for orders placed or performed in visit on 11/28/15  Hemoglobin A1c  Result Value Ref Range   Hgb A1c MFr Bld 7.2 (H) 4.6 - 6.5 %  Basic metabolic panel  Result Value Ref Range   Sodium 137 135 - 145 mEq/L   Potassium 5.1 3.5 - 5.1 mEq/L   Chloride 105 96 - 112 mEq/L   CO2 27 19 - 32 mEq/L   Glucose, Bld 209 (H) 70 - 99  mg/dL   BUN 23 6 - 23 mg/dL   Creatinine, Ser 1.28 0.40 - 1.50 mg/dL   Calcium 9.3 8.4 - 10.5 mg/dL   GFR 57.28 (L) >60.00 mL/min  TSH  Result Value Ref Range   TSH 0.21 (L) 0.35 - 4.50 uIU/mL        Assessment & Plan:   Zachary Cook is a 82 y.o. male Barrett esophagus - Plan: Ambulatory referral to Gastroenterology, esomeprazole (NEXIUM) 40 MG capsule  - Asymptomatic while taking Nexium. Refer back to GI as appears to be overdue for repeat surveillance endoscopy. Refilled Nexium.  BPH (benign prostatic hyperplasia) - Plan: PSA  -Suspected BPH by history, tolerating Flomax, repeat PSA. Discussed possible side effect of dizziness/anticholinergic side effects of Flomax, so if he does not need this medication in the future, we could do a trial off of it. Continue same dose for now.  Type 2 diabetes mellitus without complication, without long-term current use of insulin ([place])  -Followed by endocrinologist. Plan for fasting labs prior to his next visit with endocrinology, so cholesterol was not checked today.  Adult onset hypothyroidism  -Followed by endocrinology.  Meds ordered this encounter  Medications  . esomeprazole (NEXIUM) 40 MG capsule    Sig: Take 1 capsule (40 mg total) by mouth daily at 12 noon.    Dispense:  90 capsule    Refill:  1   Patient Instructions       IF you received an x-ray today, you will  receive an invoice from [facility]. Please contact [facility] at [phone] with questions or concerns regarding your invoice.   IF you received labwork today, you will receive an invoice from [employer]. Please contact [facility] at [phone] with questions or concerns regarding your invoice.   Our billing staff will not be able to assist you with questions regarding bills from these companies.  You will be contacted with the lab results as soon as they are available. The fastest way to get your results is to activate your My Chart account. Instructions are located on the last page of this paperwork. If you have not heard from [place] regarding the results in 2 weeks, please contact this office.    I will check your prostate test to make sure that is stable. No change in medications for now. I refilled your Nexium. I also referred you back to Dr. Sharlett Iles as it appears you are overdue for repeat endoscopy. Continue follow-up and lab work as planned with your endocrinologist. Follow-up with me in 6 months for physical, sooner if any new issues arise.    I personally performed the services described in this documentation, which was scribed in my presence. The recorded information has been reviewed and considered, and addended by me as needed.   Signed,   Merri Ray, MD [facility].  12/04/15 11:00 AM

## 2015-12-05 LAB — PSA: PSA: 2.2 ng/mL (ref <=4.0)

## 2015-12-15 ENCOUNTER — Encounter: Payer: Self-pay | Admitting: *Deleted

## 2016-01-05 ENCOUNTER — Encounter: Payer: Self-pay | Admitting: Internal Medicine

## 2016-02-03 ENCOUNTER — Other Ambulatory Visit: Payer: Self-pay | Admitting: Family Medicine

## 2016-02-03 DIAGNOSIS — E785 Hyperlipidemia, unspecified: Secondary | ICD-10-CM

## 2016-02-26 ENCOUNTER — Encounter: Payer: Self-pay | Admitting: *Deleted

## 2016-03-05 ENCOUNTER — Other Ambulatory Visit: Payer: Self-pay | Admitting: Endocrinology

## 2016-03-08 ENCOUNTER — Other Ambulatory Visit (INDEPENDENT_AMBULATORY_CARE_PROVIDER_SITE_OTHER): Payer: Medicare Other

## 2016-03-08 DIAGNOSIS — E1165 Type 2 diabetes mellitus with hyperglycemia: Secondary | ICD-10-CM | POA: Diagnosis not present

## 2016-03-08 DIAGNOSIS — E038 Other specified hypothyroidism: Secondary | ICD-10-CM | POA: Diagnosis not present

## 2016-03-08 DIAGNOSIS — E063 Autoimmune thyroiditis: Secondary | ICD-10-CM

## 2016-03-08 LAB — LIPID PANEL
CHOL/HDL RATIO: 4
Cholesterol: 158 mg/dL (ref 0–200)
HDL: 40.9 mg/dL (ref >39.00)
LDL Cholesterol: 93 mg/dL (ref 0–99)
NONHDL: 117.48
Triglycerides: 123 mg/dL (ref 0.0–149.0)
VLDL: 24.6 mg/dL (ref 0.0–40.0)

## 2016-03-08 LAB — COMPREHENSIVE METABOLIC PANEL
ALT: 10 U/L (ref 0–53)
AST: 13 U/L (ref 0–37)
Albumin: 4.2 g/dL (ref 3.5–5.2)
Alkaline Phosphatase: 59 U/L (ref 39–117)
BILIRUBIN TOTAL: 0.8 mg/dL (ref 0.2–1.2)
BUN: 17 mg/dL (ref 6–23)
CO2: 26 meq/L (ref 19–32)
CREATININE: 1.37 mg/dL (ref 0.40–1.50)
Calcium: 9.5 mg/dL (ref 8.4–10.5)
Chloride: 106 mEq/L (ref 96–112)
GFR: 52.92 mL/min — ABNORMAL LOW (ref >60.00)
GLUCOSE: 186 mg/dL — AB (ref 70–99)
Potassium: 4.5 mEq/L (ref 3.5–5.1)
Sodium: 142 mEq/L (ref 135–145)
Total Protein: 7.3 g/dL (ref 6.0–8.3)

## 2016-03-08 LAB — T4, FREE: FREE T4: 1.16 ng/dL (ref 0.60–1.60)

## 2016-03-08 LAB — HEMOGLOBIN A1C: HEMOGLOBIN A1C: 8.4 % — AB (ref 4.6–6.5)

## 2016-03-08 LAB — TSH: TSH: 1.19 u[IU]/mL (ref 0.35–4.50)

## 2016-03-12 ENCOUNTER — Telehealth: Payer: Self-pay | Admitting: Endocrinology

## 2016-03-12 ENCOUNTER — Ambulatory Visit (INDEPENDENT_AMBULATORY_CARE_PROVIDER_SITE_OTHER): Payer: 59 | Admitting: Endocrinology

## 2016-03-12 ENCOUNTER — Encounter: Payer: Self-pay | Admitting: Endocrinology

## 2016-03-12 VITALS — BP 160/72 | HR 76 | Ht 65.75 in | Wt 181.6 lb

## 2016-03-12 DIAGNOSIS — E063 Autoimmune thyroiditis: Secondary | ICD-10-CM

## 2016-03-12 DIAGNOSIS — I10 Essential (primary) hypertension: Secondary | ICD-10-CM

## 2016-03-12 DIAGNOSIS — E038 Other specified hypothyroidism: Secondary | ICD-10-CM | POA: Diagnosis not present

## 2016-03-12 DIAGNOSIS — E1165 Type 2 diabetes mellitus with hyperglycemia: Secondary | ICD-10-CM

## 2016-03-12 DIAGNOSIS — N183 Chronic kidney disease, stage 3 unspecified: Secondary | ICD-10-CM

## 2016-03-12 MED ORDER — EMPAGLIFLOZIN 10 MG PO TABS: 10.0000 mg | ORAL_TABLET | Freq: Every day | ORAL | 3 refills | Status: DC

## 2016-03-12 NOTE — Patient Instructions (Addendum)
Check blood sugars on waking up 3x per week  Also check blood sugars about 2 hours after a meal and do this after different meals by rotation  Recommended blood sugar levels on waking up is 90-130 and about 2 hours after meal is 130-160  Please bring your blood sugar monitor to each visit, thank you  Glimeperide 1 1/2 at supper  Call name of BP med

## 2016-03-12 NOTE — Progress Notes (Signed)
Patient ID: Zachary Cook, male   DOB: Jul 25, 1932, 83 y.o.   MRN: [id number]           Reason for Appointment: Follow-Up    History of Present Illness:          Diagnosis: Type 2 diabetes mellitus, date of diagnosis: 2005       Past history: He was started on metformin when he was found to have diabetes on routine lab work. Details of this are not available He thinks his blood sugars had been fairly well controlled for several years with metformin alone but no records are available He has had a couple of different physicians and has not always been followed consistently for his diabetes  Blood sugars had been significantly higher in the 4 months prior to his consultation in 10/15 and his A1c had gone up to 12% with his regimen of glipizide and metformin  He was given a trial of Invokana but this did not improve his blood sugars  Recent history:    Non-insulin hypoglycemic drugs : Metformin ER 750 mg, 2 tablets daily, Amaryl 4 mg at supper, Victoza 1.2 mg daily  He has been on Victoza since 10/15 and this was restarted in 1/16, initially had difficulty with nausea  He is also on metformin ER 1500 mg a day instead of the regular metformin 1 g twice a day which is better tolerated  His blood sugars are much worse with A1c going up to 8.2, previously had been down to 7.2  Current blood sugar patterns and problems identified:  He is checking blood sugars less frequently overall  He has done some readings midday and afternoon but none after supper as directed  He has gained a little weight  He has not been able to exercise because of having difficulty with balance on the treadmill with his knee going out on his treadmill and has fallen  Although he is trying to eat solids and healthy meals he has not had any formal consultation with dietitian Usually not eating breakfast        Glucose monitoring:  done 0.5 times a day         Glucometer: Accu-Chek       Blood Glucose readings   Recently readings are checked mostly in the mornings  Mean values apply above for all meters except median for One Touch  PRE-MEAL Fasting Lunch Afternoon  Bedtime Overall  Glucose range: 186, 207  166-235  127-213     Mean/median:     185   Self-care: The diet that the patient has been following is: None, usually low fat      Meals: 2 meals per day, dinner 6-7 . Breakfast is usually none; sometimes has bran flakes at 10 am; fried food occasionally          Exercise:was walking on treadmill, none recently Dietician visit, most recent: None.               Weight history: Previous range  175-205  Wt Readings from Last 3 Encounters:  03/12/16 181 lb 9.6 oz (82.4 kg)  12/04/15 176 lb (79.8 kg)  12/01/15 178 lb 3.2 oz (80.8 kg)    Glycemic control:   Lab Results  Component Value Date   HGBA1C 8.4 (H) 03/08/2016   HGBA1C 7.2 (H) 11/28/2015   HGBA1C 7.5 (H) 08/28/2015   Lab Results  Component Value Date   MICROALBUR 7.8 (H) 08/28/2015   LDLCALC 93 03/08/2016  CREATININE 1.37 03/08/2016    OTHER active problems: See review of systems   Lab on 03/08/2016  Component Date Value Ref Range Status  . Sodium 03/08/2016 142  135 - 145 mEq/L Final  . Potassium 03/08/2016 4.5  3.5 - 5.1 mEq/L Final  . Chloride 03/08/2016 106  96 - 112 mEq/L Final  . CO2 03/08/2016 26  19 - 32 mEq/L Final  . Glucose, Bld 03/08/2016 186* 70 - 99 mg/dL Final  . BUN 03/08/2016 17  6 - 23 mg/dL Final  . Creatinine, Ser 03/08/2016 1.37  0.40 - 1.50 mg/dL Final  . Total Bilirubin 03/08/2016 0.8  0.2 - 1.2 mg/dL Final  . Alkaline Phosphatase 03/08/2016 59  39 - 117 U/L Final  . AST 03/08/2016 13  0 - 37 U/L Final  . ALT 03/08/2016 10  0 - 53 U/L Final  . Total Protein 03/08/2016 7.3  6.0 - 8.3 g/dL Final  . Albumin 03/08/2016 4.2  3.5 - 5.2 g/dL Final  . Calcium 03/08/2016 9.5  8.4 - 10.5 mg/dL Final  . GFR 03/08/2016 52.92* >60.00 mL/min Final  . Cholesterol 03/08/2016 158  0 - 200 mg/dL Final  .  Triglycerides 03/08/2016 123.0  0.0 - 149.0 mg/dL Final  . HDL 03/08/2016 40.90  >39.00 mg/dL Final  . VLDL 03/08/2016 24.6  0.0 - 40.0 mg/dL Final  . LDL Cholesterol 03/08/2016 93  0 - 99 mg/dL Final  . Total CHOL/HDL Ratio 03/08/2016 4   Final  . NonHDL 03/08/2016 117.48   Final  . TSH 03/08/2016 1.19  0.35 - 4.50 uIU/mL Final  . Free T4 03/08/2016 1.16  0.60 - 1.60 ng/dL Final   Comment: Specimens from patients who are undergoing biotin therapy and /or ingesting biotin supplements may contain high levels of biotin.  The higher biotin concentration in these specimens interferes with this Free T4 assay.  Specimens that contain high levels  of biotin may cause false high results for this Free T4 assay.  Please interpret results in light of the total clinical presentation of the patient.    . Hgb A1c MFr Bld 03/08/2016 8.4* 4.6 - 6.5 % Final      Allergies as of 03/12/2016   No Known Allergies     Medication List       Accurate as of 03/12/16 10:49 AM. Always use your most recent med list.          ACCU-CHEK NANO SMARTVIEW w/Device Kit   aspirin 81 MG tablet Take 81 mg by mouth daily.   B-D UF III MINI PEN NEEDLES 31G X 5 MM Misc Generic drug:  Insulin Pen Needle USE ONE PER DAY WITH VICTOZA   empagliflozin 10 MG Tabs tablet Commonly known as:  JARDIANCE Take 10 mg by mouth daily with breakfast.   esomeprazole 40 MG capsule Commonly known as:  NEXIUM Take 1 capsule (40 mg total) by mouth daily at 12 noon.   FLUAD 0.5 ML Susy Generic drug:  Influenza Vac A&B Surf Ant Adj   glimepiride 2 MG tablet Commonly known as:  AMARYL TAKE 2 TABLETS(4 MG) BY MOUTH DAILY BEFORE DINNER   glucose blood test strip Commonly known as:  ACCU-CHEK SMARTVIEW Test blood sugar once daily   levothyroxine 125 MCG tablet Commonly known as:  SYNTHROID, LEVOTHROID Take 1 tablet (125 mcg total) by mouth daily before breakfast.   metFORMIN 750 MG 24 hr tablet Commonly known as:   GLUCOPHAGE-XR TAKE 2 TABLETS(1500 MG) BY MOUTH DAILY  WITH BREAKFAST   simvastatin 40 MG tablet Commonly known as:  ZOCOR TAKE 1 TABLET(40 MG) BY MOUTH AT BEDTIME   tamsulosin 0.4 MG Caps capsule Commonly known as:  FLOMAX TAKE ONE CAPSULE BY MOUTH EVERY DAY   VICTOZA 18 MG/3ML Sopn Generic drug:  liraglutide ADMINISTER 1.2 MG UNDER THE SKIN DAILY       Allergies: No Known Allergies  Past Medical History:  Diagnosis Date  . Barrett's esophagus   . Diabetes mellitus without complication ([place])   . Diverticulosis   . Duodenitis   . GERD (gastroesophageal reflux disease)   . Hiatal hernia     Past Surgical History:  Procedure Laterality Date  . VASECTOMY      Family History  Problem Relation Age of Onset  . Heart disease Mother   . Diabetes Mother   . Cancer Father     Social History:  reports that he has never smoked. He has never used smokeless tobacco. He reports that he does not drink alcohol or use drugs.    Review of Systems   RENAL insufficiency: His creatinine was transiently higher previously  Creatinine Clearance is relatively higher at 53 No history of microalbuminuria   Lab Results  Component Value Date   CREATININE 1.37 03/08/2016    HYPERTENSION: His blood pressure Is significantly high today Again not clear if he is taking amlodipine, has not had a prescription on his record since 2016 He thinks he is taking a white pill   BP Readings from Last 3 Encounters:  03/12/16 (!) 170/24  12/04/15 116/70  12/01/15 118/80     Most recent eye exam was 10/15       Lipids: Most recent labs as below, currently taking 40 mg Zocor       Lab Results  Component Value Date   CHOL 158 03/08/2016   HDL 40.90 03/08/2016   LDLCALC 93 03/08/2016   LDLDIRECT 125.0 12/14/2014   TRIG 123.0 03/08/2016   CHOLHDL 4 03/08/2016                  Thyroid:  He thinks he was found to have hypothyroidism about the year 2000 on routine labs without  symptoms. Apparently he did not  feel any different with taking the thyroid supplement.   His dose previously had been progressively reduced  His TSH is now Is back to normal with recently changing to 125 g   He feels quite normal with his energy level   Labs as follows:  Lab Results  Component Value Date   TSH 1.19 03/08/2016   TSH 0.21 (L) 11/28/2015   TSH 1.15 05/01/2015   FREET4 1.16 03/08/2016   FREET4 1.57 05/01/2015   FREET4 0.94 12/14/2014       He has been told to have hypogonadism of unclear etiology for the last few years.  For some time has not been on any medications but has been having no fatigue and has been  reluctant to go back on AndroGel   Lab Results  Component Value Date   TESTOSTERONE 174.72 (L) 12/14/2014     LABS:  Lab on 03/08/2016  Component Date Value Ref Range Status  . Sodium 03/08/2016 142  135 - 145 mEq/L Final  . Potassium 03/08/2016 4.5  3.5 - 5.1 mEq/L Final  . Chloride 03/08/2016 106  96 - 112 mEq/L Final  . CO2 03/08/2016 26  19 - 32 mEq/L Final  . Glucose, Bld 03/08/2016 186* 70 - 99  mg/dL Final  . BUN 03/08/2016 17  6 - 23 mg/dL Final  . Creatinine, Ser 03/08/2016 1.37  0.40 - 1.50 mg/dL Final  . Total Bilirubin 03/08/2016 0.8  0.2 - 1.2 mg/dL Final  . Alkaline Phosphatase 03/08/2016 59  39 - 117 U/L Final  . AST 03/08/2016 13  0 - 37 U/L Final  . ALT 03/08/2016 10  0 - 53 U/L Final  . Total Protein 03/08/2016 7.3  6.0 - 8.3 g/dL Final  . Albumin 03/08/2016 4.2  3.5 - 5.2 g/dL Final  . Calcium 03/08/2016 9.5  8.4 - 10.5 mg/dL Final  . GFR 03/08/2016 52.92* >60.00 mL/min Final  . Cholesterol 03/08/2016 158  0 - 200 mg/dL Final  . Triglycerides 03/08/2016 123.0  0.0 - 149.0 mg/dL Final  . HDL 03/08/2016 40.90  >39.00 mg/dL Final  . VLDL 03/08/2016 24.6  0.0 - 40.0 mg/dL Final  . LDL Cholesterol 03/08/2016 93  0 - 99 mg/dL Final  . Total CHOL/HDL Ratio 03/08/2016 4   Final  . NonHDL 03/08/2016 117.48   Final  . TSH  03/08/2016 1.19  0.35 - 4.50 uIU/mL Final  . Free T4 03/08/2016 1.16  0.60 - 1.60 ng/dL Final   Comment: Specimens from patients who are undergoing biotin therapy and /or ingesting biotin supplements may contain high levels of biotin.  The higher biotin concentration in these specimens interferes with this Free T4 assay.  Specimens that contain high levels  of biotin may cause false high results for this Free T4 assay.  Please interpret results in light of the total clinical presentation of the patient.    . Hgb A1c MFr Bld 03/08/2016 8.4* 4.6 - 6.5 % Final    Physical Examination:  BP (!) 170/24   Pulse 76   Ht 5' 5.75" (1.67 m)   Wt 181 lb 9.6 oz (82.4 kg)   SpO2 96%   BMI 29.54 kg/m     Repeat blood pressure was more accurate on the right arm No ankle edema present  ASSESSMENT:  Diabetes type 2, BMI 30 See history of present illness for detailed discussion of current diabetes management, blood sugar patterns and problems identified  Blood sugars are poorly controlled with A1c 8.4 Has several readings over 200 at home including in the morning, lab glucose 186 fasting Not clear what his blood sugars are after supper as he does not monitor Also has not exercised  He is on a 3 drug regimen including Victoza 1.2 mg, Amaryl and 1500 mg of metformin ER  HYPERTENSION: His blood pressure is significantly high, not clear if he is taking any treatment and he will call back to let us know what he is taking if any  HYPOTHYROIDISM: Subjectively doing well and TSH is back to normal with 125 g  Renal function: Borderline with creatinine clearance 53  LIPIDS: Well controlled on Zocor  PLAN:   He will be given a trial of Jardiance although previously had reportedly not had benefited from [place] Discussed action of SGLT 2 drugs on lowering glucose by decreasing kidney absorption of glucose, benefits of weight loss and lower blood pressure, possible side effects including candidiasis  and dosage regimen   This should improve his blood pressure but may need to adjust blood pressure medications further anyway  Discussed need to start walking and he can go to any indoor location to walk instead of trying to on his treadmill on which he has had difficulty with his balance  Consultation  with dietitian.  More readings after supper  Follow-up in 4 weeks and recheck renal function with starting Jardiance  Increase Amaryl to 6 mg at suppertime for now  No change in Synthroid 125 g   Patient Instructions  Check blood sugars on waking up 3x per week  Also check blood sugars about 2 hours after a meal and do this after different meals by rotation  Recommended blood sugar levels on waking up is 90-130 and about 2 hours after meal is 130-160  Please bring your blood sugar monitor to each visit, thank you  Glimeperide 1 1/2 at supper  Call name of BP med    Counseling time on subjects discussed above is over 50% of today's 25 minute visit   Ethaniel Garfield 03/12/2016, 10:49 AM   Note: This office note was prepared with Estate agent. Any transcriptional errors that result from this process are unintentional.

## 2016-03-12 NOTE — Telephone Encounter (Signed)
Pt is suppose to be taking the BP med amlodipine besylate 5 mg   He found out when he got home today that he had not been taking it and is out of it.  Please call into [facility]

## 2016-03-21 ENCOUNTER — Other Ambulatory Visit: Payer: Self-pay | Admitting: Family Medicine

## 2016-03-21 ENCOUNTER — Other Ambulatory Visit: Payer: Self-pay | Admitting: Endocrinology

## 2016-03-21 DIAGNOSIS — E1165 Type 2 diabetes mellitus with hyperglycemia: Secondary | ICD-10-CM

## 2016-03-22 ENCOUNTER — Encounter: Payer: 59 | Attending: Endocrinology | Admitting: Dietician

## 2016-03-22 ENCOUNTER — Encounter: Payer: Self-pay | Admitting: Dietician

## 2016-03-22 DIAGNOSIS — E1165 Type 2 diabetes mellitus with hyperglycemia: Secondary | ICD-10-CM | POA: Diagnosis not present

## 2016-03-22 DIAGNOSIS — Z713 Dietary counseling and surveillance: Secondary | ICD-10-CM | POA: Insufficient documentation

## 2016-03-22 DIAGNOSIS — E119 Type 2 diabetes mellitus without complications: Secondary | ICD-10-CM

## 2016-03-22 NOTE — Patient Instructions (Addendum)
Keep an active lifestyle.  Walk or armchair exercise or other exercise you enjoy most days of the week. Rethink what you drink.  Avoid beverages with carbohydrates. Try to eat something in the morning even if it is a Glucerna or Boost Glucose Control shake or Protein bar. When you eat carbohydrate have a small portion of carbohydrate. Aim for 3 meals per day even if they are small.  This will help increase your protein and other nutrient intake. Choose foods that are baked or grilled rather than fried.  Avoid gravy most of the time.  Limit butter to 1 pat.

## 2016-03-22 NOTE — Progress Notes (Signed)
  Medical Nutrition Therapy:  Appt start time: [id number] end time:  [id number].   Assessment:  Primary concerns today: Patient is here today alone.  He would like to learn how to better control his blood sugar. Hx includes:  Type 2 diabetes dx in 2005, GERD, barrett's esophagitis, diverticulitis and CKD. Weight today 175 lbs which is relatively stable.  A1C 8.4% 03/08/16.    Patient lives with his wife.  He does the shopping and cooking or eats out.  He often only eats 1 meal and 1 snack per day.  His wife has uncontrolled diabetes and does not care to make changes.  He retired from the police department.  He worked 3rd shift [employer] and retired about 6 years ago.  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 Avoided foods include bread and french fries "both gave me the hiccups".    24-hr recall:  B ( AM): none  Snk ( AM):   L (3-4 PM): salad with protein OR stew OR roast or fish with baked potato with butter, vegetables with butter OR spaghetti with meat sauce OR eats out at [employer], [employer], or diner Snk ( PM):  D ( PM):  Snk ( PM): apple or orange or crackers or sugar free jello with fruit (or regular if he can't find the sugar free) Beverages: sweet tea (1 glass every 2 days), water, Diet coke, whole milk (1 glass every other day)  Usual physical activity: has a treadmill but doesn't use because his knee goes out, He is concerned about falling is he walks everywhere.  Estimated energy needs: 1800 calories 200 g carbohydrates 135 g protein 50 g fat  Progress Towards Goal(s):  In progress.   Nutritional Diagnosis:  NB-1.1 Food and nutrition-related knowledge deficit As related to balance of carbohydrate, protein, and  fat.  As evidenced by diet hx and patient report.    Intervention:  Nutrition counseling and diabetes education initiated. Discussed Carb Counting by food group as method of portion control, reading food labels, and benefits of increased activity. Also discussed basic physiology of Diabetes, target BG ranges pre and post meals, and A1c.   Keep an active lifestyle.  Walk or armchair exercise or other exercise you enjoy most days of the week. Rethink what you drink.  Avoid beverages with carbohydrates. Try to eat something in the morning even if it is a Glucerna or Boost Glucose Control shake or Protein bar. When you eat carbohydrate have a small portion of carbohydrate. Aim for 3 meals per day even if they are small.  This will help increase your protein and other nutrient intake. Choose foods that are baked or grilled rather than fried.  Avoid gravy most of the time.  Limit butter to 1 pat.  Teaching Method Utilized:  Visual Auditory Hands on  Handouts given during visit include: Living Well with Diabetes Food Label handouts Meal Plan Card  Snack list  Label reading  Dining out with diabetes  A1C sheet  Barriers to learning/adherence to lifestyle change: decreased appetite, balance issues  Demonstrated degree of understanding via:  Teach Back   Monitoring/Evaluation:  Dietary intake, exercise, label reading, and body weight in 1 month(s).

## 2016-03-24 NOTE — Telephone Encounter (Signed)
11/2015 last ov 

## 2016-03-27 ENCOUNTER — Ambulatory Visit: Payer: Self-pay | Admitting: Internal Medicine

## 2016-04-01 ENCOUNTER — Other Ambulatory Visit: Payer: Self-pay | Admitting: Endocrinology

## 2016-04-01 ENCOUNTER — Other Ambulatory Visit: Payer: Self-pay | Admitting: Family Medicine

## 2016-04-04 ENCOUNTER — Other Ambulatory Visit: Payer: Self-pay | Admitting: Endocrinology

## 2016-04-05 ENCOUNTER — Other Ambulatory Visit (INDEPENDENT_AMBULATORY_CARE_PROVIDER_SITE_OTHER): Payer: Medicare Other

## 2016-04-05 DIAGNOSIS — E1165 Type 2 diabetes mellitus with hyperglycemia: Secondary | ICD-10-CM | POA: Diagnosis not present

## 2016-04-05 LAB — BASIC METABOLIC PANEL
BUN: 34 mg/dL — ABNORMAL HIGH (ref 6–23)
CHLORIDE: 105 meq/L (ref 96–112)
CO2: 28 meq/L (ref 19–32)
CREATININE: 1.48 mg/dL (ref 0.40–1.50)
Calcium: 9.4 mg/dL (ref 8.4–10.5)
GFR: 48.4 mL/min — ABNORMAL LOW (ref >60.00)
Glucose, Bld: 217 mg/dL — ABNORMAL HIGH (ref 70–99)
POTASSIUM: 4.8 meq/L (ref 3.5–5.1)
Sodium: 141 mEq/L (ref 135–145)

## 2016-04-10 ENCOUNTER — Encounter: Payer: Self-pay | Admitting: Endocrinology

## 2016-04-10 ENCOUNTER — Ambulatory Visit (INDEPENDENT_AMBULATORY_CARE_PROVIDER_SITE_OTHER): Payer: 59 | Admitting: Endocrinology

## 2016-04-10 VITALS — BP 128/82 | HR 73 | Ht 68.0 in | Wt 177.0 lb

## 2016-04-10 DIAGNOSIS — E1165 Type 2 diabetes mellitus with hyperglycemia: Secondary | ICD-10-CM

## 2016-04-10 MED ORDER — INSULIN GLARGINE 300 UNIT/ML ~~LOC~~ SOPN: 12.0000 [IU] | PEN_INJECTOR | Freq: Every day | SUBCUTANEOUS | 3 refills | Status: DC

## 2016-04-10 MED ORDER — GLUCOSE BLOOD VI STRP: ORAL_STRIP | 0 refills | Status: DC

## 2016-04-10 NOTE — Patient Instructions (Signed)
TOUJEO insulin: This insulin provides blood sugar control for up to 24 hours.    Start with 6 units IN daily and increase by 2 units every 3 days until the waking up sugars are under 130. Then continue the same dose.  If blood sugar is under 90 for 2 days in a row, reduce the dose by 2 units.  Note that this insulin does not control the rise of blood sugar with meals    GLIMEPERIDE 1 AT NITE

## 2016-04-10 NOTE — Progress Notes (Signed)
Patient ID: Zachary Cook, male   DOB: February 27, 1933, 83 y.o.   MRN: [id number]           Reason for Appointment: Follow-Up    History of Present Illness:          Diagnosis: Type 2 diabetes mellitus, date of diagnosis: 2005       Past history: He was started on metformin when he was found to have diabetes on routine lab work. Details of this are not available He thinks his blood sugars had been fairly well controlled for several years with metformin alone but no records are available He has had a couple of different physicians and has not always been followed consistently for his diabetes  Blood sugars had been significantly higher in the 4 months prior to his consultation in 10/15 and his A1c had gone up to 12% with his regimen of glipizide and metformin  He was given a trial of Invokana but this did not improve his blood sugars  Recent history:    Non-insulin hypoglycemic drugs : Metformin ER 750 mg, 2 tablets daily, Amaryl 4 mg at supper, Victoza 1.2 mg daily  He has been on Victoza since 10/15 and this was restarted in 1/16, initially had difficulty with nausea He is also on metformin ER 1500 mg a day instead of the regular metformin 1 g twice a day which is better tolerated Since he did not have good control in 12/17 he was given a trial of Jardiance 10 mg daily in addition to his other medications  Last December and inadequate control with A1c going up to 8.4, previously had been down to 7.2  Current blood sugar patterns and problems identified:  He is still having high readings at most times especially in the morning and some late at night also  Even though he has lost weight with Jardiance his blood sugars do not seem to be better compared to the last visit  Also still not very motivated to start exercising much  He thinks he is trying to watch his diet fairly well but may be inconsistent at night  His lowest blood sugar was 142 during the night and highest 282 at  bedtime Usually not eating breakfast        Glucose monitoring:  done 0.5 times a day         Glucometer: Accu-Chek       Blood Glucose readings  Recently readings   Mean values apply above for all meters except median for One Touch  PRE-MEAL Fasting Lunch Dinner Bedtime Overall  Glucose range: 147-244   241  150-282    Mean/median:     190    Self-care: The diet that the patient has been following is: None, usually low fat      Meals: 2-3 meals per day, dinner 6-7 . Breakfast is usually none; sometimes has bran flakes at 10 am; fried food occasionally          Exercise:was walking on treadmill, none recently Dietician visit, most recent: None.               Weight history: Previous range  175-205  Wt Readings from Last 3 Encounters:  04/10/16 177 lb (80.3 kg)  03/22/16 175 lb (79.4 kg)  03/12/16 181 lb 9.6 oz (82.4 kg)    Glycemic control:   Lab Results  Component Value Date   HGBA1C 8.4 (H) 03/08/2016   HGBA1C 7.2 (H) 11/28/2015   HGBA1C 7.5 (H)  08/28/2015   Lab Results  Component Value Date   MICROALBUR 7.8 (H) 08/28/2015   LDLCALC 93 03/08/2016   CREATININE 1.48 04/05/2016    OTHER active problems: See review of systems   Lab on 04/05/2016  Component Date Value Ref Range Status  . Sodium 04/05/2016 141  135 - 145 mEq/L Final  . Potassium 04/05/2016 4.8  3.5 - 5.1 mEq/L Final  . Chloride 04/05/2016 105  96 - 112 mEq/L Final  . CO2 04/05/2016 28  19 - 32 mEq/L Final  . Glucose, Bld 04/05/2016 217* 70 - 99 mg/dL Final  . BUN 37/53/8655 34* 6 - 23 mg/dL Final  . Creatinine, Ser 04/05/2016 1.48  0.40 - 1.50 mg/dL Final  . Calcium 57/67/8653 9.4  8.4 - 10.5 mg/dL Final  . GFR 85/04/8525 48.40* >60.00 mL/min Final      Allergies as of 04/10/2016   No Known Allergies     Medication List       Accurate as of 04/10/16 11:59 PM. Always use your most recent med list.          ACCU-CHEK NANO SMARTVIEW w/Device Kit   aspirin 81 MG tablet Take 81 mg by  mouth daily.   B-D UF III MINI PEN NEEDLES 31G X 5 MM Misc Generic drug:  Insulin Pen Needle USE ONE PER DAY WITH VICTOZA   esomeprazole 40 MG capsule Commonly known as:  NEXIUM Take 1 capsule (40 mg total) by mouth daily at 12 noon.   FLUAD 0.5 ML Susy Generic drug:  Influenza Vac A&B Surf Ant Adj   glimepiride 2 MG tablet Commonly known as:  AMARYL TAKE 2 TABLETS(4 MG) BY MOUTH DAILY BEFORE DINNER   glucose blood test strip Commonly known as:  ACCU-CHEK SMARTVIEW Test blood sugar twice daily   Insulin Glargine 300 UNIT/ML Sopn Commonly known as:  TOUJEO SOLOSTAR Inject 12 Units into the skin daily.   levothyroxine 125 MCG tablet Commonly known as:  SYNTHROID, LEVOTHROID Take 1 tablet (125 mcg total) by mouth daily before breakfast.   metFORMIN 750 MG 24 hr tablet Commonly known as:  GLUCOPHAGE-XR TAKE 2 TABLETS(1500 MG) BY MOUTH DAILY WITH BREAKFAST   simvastatin 40 MG tablet Commonly known as:  ZOCOR TAKE 1 TABLET(40 MG) BY MOUTH AT BEDTIME   tamsulosin 0.4 MG Caps capsule Commonly known as:  FLOMAX TAKE 1 CAPSULE BY MOUTH EVERY DAY   VICTOZA 18 MG/3ML Sopn Generic drug:  liraglutide ADMINISTER 1.2 MG UNDER THE SKIN DAILY       Allergies: No Known Allergies  Past Medical History:  Diagnosis Date  . Barrett's esophagus   . Diabetes mellitus without complication (HCC)   . Diverticulosis   . Duodenitis   . GERD (gastroesophageal reflux disease)   . Hiatal hernia     Past Surgical History:  Procedure Laterality Date  . VASECTOMY      Family History  Problem Relation Age of Onset  . Heart disease Mother   . Diabetes Mother   . Cancer Father     Social History:  reports that he has never smoked. He has never used smokeless tobacco. He reports that he does not drink alcohol or use drugs.    Review of Systems   RENAL insufficiency: His creatinine was transiently higher previously, Now upper normal  No history of microalbuminuria   Lab  Results  Component Value Date   CREATININE 1.48 04/05/2016    HYPERTENSION: His blood pressure Is Better with being consistent  on amlodipine and also starting Jardiance    BP Readings from Last 3 Encounters:  04/10/16 128/82  03/12/16 (!) 160/72  12/04/15 116/70     Most recent eye exam was 10/15       Lipids: Most recent labs as below, currently taking 40 mg Zocor       Lab Results  Component Value Date   CHOL 158 03/08/2016   HDL 40.90 03/08/2016   LDLCALC 93 03/08/2016   LDLDIRECT 125.0 12/14/2014   TRIG 123.0 03/08/2016   CHOLHDL 4 03/08/2016                  Thyroid:  He thinks he was found to have hypothyroidism about the year 2000 on routine labs without symptoms. Apparently he did not  feel any different with taking the thyroid supplement.   His dose previously had been progressively reduced  His TSH is normal with  changing to 125 g In 9/17 No unusual fatigue recently    Labs as follows:  Lab Results  Component Value Date   TSH 1.19 03/08/2016   TSH 0.21 (L) 11/28/2015   TSH 1.15 05/01/2015   FREET4 1.16 03/08/2016   FREET4 1.57 05/01/2015   FREET4 0.94 12/14/2014       He has been told to have hypogonadism of unclear etiology for the last few years.  For some time has not been on any medications but has been having no fatigue and has been  reluctant to go back on AndroGel   Lab Results  Component Value Date   TESTOSTERONE 174.72 (L) 12/14/2014     LABS:  Lab on 04/05/2016  Component Date Value Ref Range Status  . Sodium 04/05/2016 141  135 - 145 mEq/L Final  . Potassium 04/05/2016 4.8  3.5 - 5.1 mEq/L Final  . Chloride 04/05/2016 105  96 - 112 mEq/L Final  . CO2 04/05/2016 28  19 - 32 mEq/L Final  . Glucose, Bld 04/05/2016 217* 70 - 99 mg/dL Final  . BUN 04/05/2016 34* 6 - 23 mg/dL Final  . Creatinine, Ser 04/05/2016 1.48  0.40 - 1.50 mg/dL Final  . Calcium 04/05/2016 9.4  8.4 - 10.5 mg/dL Final  . GFR 04/05/2016 48.40* >60.00  mL/min Final    Physical Examination:  BP 128/82   Pulse 73   Ht '5\' 8"'$  (1.727 m)   Wt 177 lb (80.3 kg)   SpO2 94%   BMI 26.91 kg/m   No ankle edema present  ASSESSMENT:  Diabetes type 2, BMI 30 See history of present illness for detailed discussion of current diabetes management, blood sugar patterns and problems identified  He is taking multiple medications including Jardiance and Victoza Last A1c was 8.4 Blood sugars averaging nearly 200 at home and not improving with Jardiance despite weight loss He has usually done fairly well with his diet and is starting to do a little exercise also  Discussed with the patient that he has had some progression of his diabetes and now some insulin deficiency With his BMI under 27 he likely does not have much insulin resistance  HYPERTENSION: His blood pressure is improved and he will continue the same regimen   HYPOTHYROIDISM: Will need regarded follow-up of his TSH, more recently normal   PLAN:   Stop Jardiance  Start basal insulin with Toujeo.  Discussed in detail the actions of basal insulin, how to use the insulin pen for injection, sites of injection , timing of injection  Also given  him a flowsheet with instructions on adjusting the dose based on fasting blood sugar every 3 days  More readings after supper  Will need short-term follow-up  Encouraged him to see the dietitian also for review  Will reduce his Amaryl to 1 tablet instead of 1-1/2   Patient Instructions    TOUJEO insulin: This insulin provides blood sugar control for up to 24 hours.    Start with 6 units IN daily and increase by 2 units every 3 days until the waking up sugars are under 130. Then continue the same dose.  If blood sugar is under 90 for 2 days in a row, reduce the dose by 2 units.  Note that this insulin does not control the rise of blood sugar with meals    GLIMEPERIDE 1 AT NITE    Counseling time on subjects discussed above is over 50%  of today's 25 minute visit   Lamanda Rudder 04/14/2016, 3:33 PM   Note: This office note was prepared with Dragon voice recognition system technology. Any transcriptional errors that result from this process are unintentional.

## 2016-04-13 ENCOUNTER — Other Ambulatory Visit: Payer: Self-pay | Admitting: Family Medicine

## 2016-04-13 DIAGNOSIS — K2270 Barrett's esophagus without dysplasia: Secondary | ICD-10-CM

## 2016-04-14 NOTE — Telephone Encounter (Signed)
11/2015 last ov for [facility]

## 2016-04-19 ENCOUNTER — Ambulatory Visit: Payer: Self-pay | Admitting: Dietician

## 2016-04-24 ENCOUNTER — Other Ambulatory Visit: Payer: Self-pay | Admitting: Endocrinology

## 2016-05-03 ENCOUNTER — Other Ambulatory Visit: Payer: Self-pay | Admitting: Endocrinology

## 2016-05-03 ENCOUNTER — Ambulatory Visit: Payer: Medicare Other

## 2016-05-03 DIAGNOSIS — E1165 Type 2 diabetes mellitus with hyperglycemia: Secondary | ICD-10-CM

## 2016-05-03 LAB — BASIC METABOLIC PANEL
BUN: 21 mg/dL (ref 6–23)
CHLORIDE: 104 meq/L (ref 96–112)
CO2: 27 mEq/L (ref 19–32)
Calcium: 9.7 mg/dL (ref 8.4–10.5)
Creatinine, Ser: 1.33 mg/dL (ref 0.40–1.50)
GFR: 54.74 mL/min — ABNORMAL LOW (ref >60.00)
GLUCOSE: 154 mg/dL — AB (ref 70–99)
Potassium: 4.9 mEq/L (ref 3.5–5.1)
Sodium: 137 mEq/L (ref 135–145)

## 2016-05-04 LAB — FRUCTOSAMINE: Fructosamine: 310 umol/L — ABNORMAL HIGH (ref 0–285)

## 2016-05-06 ENCOUNTER — Other Ambulatory Visit: Payer: Self-pay

## 2016-05-06 MED ORDER — LIRAGLUTIDE 18 MG/3ML ~~LOC~~ SOPN: PEN_INJECTOR | SUBCUTANEOUS | 0 refills | Status: DC

## 2016-05-06 MED ORDER — LEVOTHYROXINE SODIUM 125 MCG PO TABS: 125.0000 ug | ORAL_TABLET | Freq: Every day | ORAL | 4 refills | Status: DC

## 2016-05-07 ENCOUNTER — Other Ambulatory Visit: Payer: Self-pay

## 2016-05-07 MED ORDER — LEVOTHYROXINE SODIUM 125 MCG PO TABS: 125.0000 ug | ORAL_TABLET | Freq: Every day | ORAL | 4 refills | Status: DC

## 2016-05-07 MED ORDER — LIRAGLUTIDE 18 MG/3ML ~~LOC~~ SOPN: PEN_INJECTOR | SUBCUTANEOUS | 0 refills | Status: DC

## 2016-05-08 ENCOUNTER — Ambulatory Visit (INDEPENDENT_AMBULATORY_CARE_PROVIDER_SITE_OTHER): Payer: Medicare Other | Admitting: Endocrinology

## 2016-05-08 ENCOUNTER — Encounter: Payer: Self-pay | Admitting: Endocrinology

## 2016-05-08 VITALS — BP 120/80 | HR 76 | Ht 68.0 in | Wt 172.0 lb

## 2016-05-08 DIAGNOSIS — E1165 Type 2 diabetes mellitus with hyperglycemia: Secondary | ICD-10-CM

## 2016-05-08 NOTE — Patient Instructions (Signed)
Check blood sugars on waking up 3-4x weekly   Also check blood sugars about 2 hours after a meal and do this after different meals by rotation  Recommended blood sugar levels on waking up is 90-130 and about 2 hours after meal is 130-160  Please bring your blood sugar monitor to each visit, thank you

## 2016-05-08 NOTE — Progress Notes (Signed)
Patient ID: Zachary Cook, male   DOB: 12/07/1932, 83 y.o.   MRN: [id number]           Reason for Appointment: Follow-Up    History of Present Illness:          Diagnosis: Type 2 diabetes mellitus, date of diagnosis: 2005       Past history: He was started on metformin when he was found to have diabetes on routine lab work. Details of this are not available He thinks his blood sugars had been fairly well controlled for several years with metformin alone but no records are available He has had a couple of different physicians and has not always been followed consistently for his diabetes  Blood sugars had been significantly higher in the 4 months prior to his consultation in 10/15 and his A1c had gone up to 12% with his regimen of glipizide and metformin  He was given a trial of Invokana but this did not improve his blood sugars He has been on Victoza since 10/15 and this was restarted in 1/16, initially had difficulty with nausea  Recent history:    INSULIN regimen: Toujeo 18 units daily  Non-insulin hypoglycemic drugs : Metformin ER 750 mg, 2 tablets daily, Amaryl 4 mg at supper, Victoza 1.2 mg daily  Since his blood sugars were high especially fasting.  Jardiance was changed to [facility]  Last December he had inadequate control with A1c going up to 8.4, previously had been down to 7.2  Current blood sugar patterns and problems identified:  He has been able to adjust his insulin on his own and has gone up to 18 units in the last 10 days or so  His fasting readings are excellent now with some variability but no hypoglycemia am a glucose was 91 today  However he still has significant variability of his blood sugars at night after supper with some readings over 200  The date and time on his monitor is incorrect and difficult to ago.  His blood sugar download today  He has lost weight despite stopping Jardiance  He says he is trying to walk on his treadmill more regularly  now  Also somewhat better with controlling portions and overall diet now Usually not eating breakfast        Glucose monitoring:  done 1.4 times a day         Glucometer: Accu-Chek       Blood Glucose readings  Recently readings from download  Mean values apply above for all meters except median for One Touch  PRE-MEAL Fasting Lunch Dinner Bedtime Overall  Glucose range: 72-156  101  122-250  72-283    Mean/median:     173    Self-care: The diet that the patient has been following is: None, usually low fat      Meals: 2-3 meals per day, dinner 6-7 . Breakfast is usually none; sometimes has bran flakes at 10 am; fried food occasionally          Exercise: walking on treadmill Dietician visit, most recent: None.               Weight history: Previous range  175-205  Wt Readings from Last 3 Encounters:  05/08/16 172 lb (78 kg)  04/10/16 177 lb (80.3 kg)  03/22/16 175 lb (79.4 kg)    Glycemic control:   Lab Results  Component Value Date   HGBA1C 8.4 (H) 03/08/2016   HGBA1C 7.2 (H) 11/28/2015  HGBA1C 7.5 (H) 08/28/2015   Lab Results  Component Value Date   MICROALBUR 7.8 (H) 08/28/2015   LDLCALC 93 03/08/2016   CREATININE 1.33 05/03/2016    OTHER active problems: See review of systems   Office Visit on 05/03/2016  Component Date Value Ref Range Status  . Sodium 05/03/2016 137  135 - 145 mEq/L Final  . Potassium 05/03/2016 4.9  3.5 - 5.1 mEq/L Final  . Chloride 05/03/2016 104  96 - 112 mEq/L Final  . CO2 05/03/2016 27  19 - 32 mEq/L Final  . Glucose, Bld 05/03/2016 154* 70 - 99 mg/dL Final  . BUN 05/03/2016 21  6 - 23 mg/dL Final  . Creatinine, Ser 05/03/2016 1.33  0.40 - 1.50 mg/dL Final  . Calcium 05/03/2016 9.7  8.4 - 10.5 mg/dL Final  . GFR 05/03/2016 54.74* >60.00 mL/min Final  . Fructosamine 05/03/2016 310* 0 - 285 umol/L Final   Comment: Published reference interval for apparently healthy subjects between age 51 and 35 is 86 - 285 umol/L and in a  poorly controlled diabetic population is 228 - 563 umol/L with a mean of 396 umol/L.       Allergies as of 05/08/2016   No Known Allergies     Medication List       Accurate as of 05/08/16 11:59 PM. Always use your most recent med list.          ACCU-CHEK NANO SMARTVIEW w/Device Kit   aspirin 81 MG tablet Take 81 mg by mouth daily.   B-D UF III MINI PEN NEEDLES 31G X 5 MM Misc Generic drug:  Insulin Pen Needle USE ONE PER DAY WITH VICTOZA   esomeprazole 40 MG capsule Commonly known as:  NEXIUM TAKE ONE CAPSULE BY MOUTH DAILY AT NOON   FLUAD 0.5 ML Susy Generic drug:  Influenza Vac A&B Surf Ant Adj   glimepiride 2 MG tablet Commonly known as:  AMARYL TAKE 2 TABLETS(4 MG) BY MOUTH DAILY BEFORE DINNER   glucose blood test strip Commonly known as:  ACCU-CHEK SMARTVIEW Test blood sugar twice daily   Insulin Glargine 300 UNIT/ML Sopn Commonly known as:  TOUJEO SOLOSTAR Inject 12 Units into the skin daily.   levothyroxine 125 MCG tablet Commonly known as:  SYNTHROID, LEVOTHROID Take 1 tablet (125 mcg total) by mouth daily before breakfast.   liraglutide 18 MG/3ML Sopn Commonly known as:  VICTOZA ADMINISTER 1.2 MG UNDER THE SKIN DAILY   metFORMIN 750 MG 24 hr tablet Commonly known as:  GLUCOPHAGE-XR TAKE 2 TABLETS(1500 MG) BY MOUTH DAILY WITH BREAKFAST   simvastatin 40 MG tablet Commonly known as:  ZOCOR TAKE 1 TABLET(40 MG) BY MOUTH AT BEDTIME   tamsulosin 0.4 MG Caps capsule Commonly known as:  FLOMAX TAKE 1 CAPSULE BY MOUTH EVERY DAY       Allergies: No Known Allergies  Past Medical History:  Diagnosis Date  . Barrett's esophagus   . Diabetes mellitus without complication ([place])   . Diverticulosis   . Duodenitis   . GERD (gastroesophageal reflux disease)   . Hiatal hernia     Past Surgical History:  Procedure Laterality Date  . VASECTOMY      Family History  Problem Relation Age of Onset  . Heart disease Mother   . Diabetes Mother    . Cancer Father     Social History:  reports that he has never smoked. He has never used smokeless tobacco. He reports that he does not drink alcohol or  use drugs.    Review of Systems   The following is a copy of previous note:  RENAL insufficiency: His creatinine was transiently higher previously, Now upper normal  No history of microalbuminuria   Lab Results  Component Value Date   CREATININE 1.33 05/03/2016    HYPERTENSION: His blood pressure Is Better with being consistent on amlodipine and also starting Jardiance    BP Readings from Last 3 Encounters:  05/08/16 120/80  04/10/16 128/82  03/12/16 (!) 160/72     Most recent eye exam was 10/15       Lipids: Most recent labs as below, currently taking 40 mg Zocor       Lab Results  Component Value Date   CHOL 158 03/08/2016   HDL 40.90 03/08/2016   LDLCALC 93 03/08/2016   LDLDIRECT 125.0 12/14/2014   TRIG 123.0 03/08/2016   CHOLHDL 4 03/08/2016                  Thyroid:  He thinks he was found to have hypothyroidism about the year 2000 on routine labs without symptoms. Apparently he did not  feel any different with taking the thyroid supplement.   His dose previously had been progressively reduced  His TSH is normal with  changing to 125 g In 9/17 No unusual fatigue recently    Labs as follows:  Lab Results  Component Value Date   TSH 1.19 03/08/2016   TSH 0.21 (L) 11/28/2015   TSH 1.15 05/01/2015   FREET4 1.16 03/08/2016   FREET4 1.57 05/01/2015   FREET4 0.94 12/14/2014       He has been told to have hypogonadism of unclear etiology for the last few years.  For some time has not been on any medications but has been having no fatigue and has been  reluctant to go back on AndroGel   Lab Results  Component Value Date   TESTOSTERONE 174.72 (L) 12/14/2014     LABS:  Office Visit on 05/03/2016  Component Date Value Ref Range Status  . Sodium 05/03/2016 137  135 - 145 mEq/L Final  .  Potassium 05/03/2016 4.9  3.5 - 5.1 mEq/L Final  . Chloride 05/03/2016 104  96 - 112 mEq/L Final  . CO2 05/03/2016 27  19 - 32 mEq/L Final  . Glucose, Bld 05/03/2016 154* 70 - 99 mg/dL Final  . BUN 05/03/2016 21  6 - 23 mg/dL Final  . Creatinine, Ser 05/03/2016 1.33  0.40 - 1.50 mg/dL Final  . Calcium 05/03/2016 9.7  8.4 - 10.5 mg/dL Final  . GFR 05/03/2016 54.74* >60.00 mL/min Final  . Fructosamine 05/03/2016 310* 0 - 285 umol/L Final   Comment: Published reference interval for apparently healthy subjects between age 7 and 40 is 28 - 285 umol/L and in a poorly controlled diabetic population is 228 - 563 umol/L with a mean of 396 umol/L.     Physical Examination:  BP 120/80   Pulse 76   Ht '5\' 8"'$  (1.727 m)   Wt 172 lb (78 kg)   SpO2 96%   BMI 26.15 kg/m   No ankle edema present  ASSESSMENT:  Diabetes type 2, BMI 30 See history of present illness for detailed discussion of current diabetes management, blood sugar patterns and problems identified  Last A1c was 8.4 With starting basal insulin his morning sugars are significantly better However based on his diet he still has some readings over 200 after supper or lunch at times Fasting readings  are fairly close to normal in the last 10 days with using 18 units Toujeo and he is comfortable doing the injection now    PLAN:   He will continue the same dose of Toujeo but reduce the dose by 2 units if morning sugars stay around 90 or below  No change in Victoza, Amaryl or metformin  Recheck A1c on the next visit   Patient Instructions  Check blood sugars on waking up 3-4x weekly   Also check blood sugars about 2 hours after a meal and do this after different meals by rotation  Recommended blood sugar levels on waking up is 90-130 and about 2 hours after meal is 130-160  Please bring your blood sugar monitor to each visit, thank you       [facility] 05/09/2016, 12:49 PM   Note: This office note was prepared with  Dragon voice recognition system technology. Any transcriptional errors that result from this process are unintentional.

## 2016-05-09 DIAGNOSIS — Z9289 Personal history of other medical treatment: Secondary | ICD-10-CM

## 2016-05-09 HISTORY — DX: Personal history of other medical treatment: Z92.89

## 2016-05-15 ENCOUNTER — Ambulatory Visit: Payer: Self-pay | Admitting: Internal Medicine

## 2016-05-23 ENCOUNTER — Other Ambulatory Visit: Payer: Self-pay | Admitting: Endocrinology

## 2016-05-27 ENCOUNTER — Inpatient Hospital Stay (HOSPITAL_COMMUNITY): Payer: Medicare Other

## 2016-05-27 ENCOUNTER — Encounter (HOSPITAL_COMMUNITY): Payer: Self-pay | Admitting: Emergency Medicine

## 2016-05-27 ENCOUNTER — Inpatient Hospital Stay (HOSPITAL_COMMUNITY)
Admission: EM | Admit: 2016-05-27 | Discharge: 2016-05-31 | DRG: 356 | Disposition: A | Discharge status: Home / Self Care | Payer: Medicare Other | Attending: Family Medicine | Admitting: Family Medicine

## 2016-05-27 DIAGNOSIS — Z7984 Long term (current) use of oral hypoglycemic drugs: Secondary | ICD-10-CM | POA: Diagnosis not present

## 2016-05-27 DIAGNOSIS — R578 Other shock: Secondary | ICD-10-CM | POA: Diagnosis present

## 2016-05-27 DIAGNOSIS — K2270 Barrett's esophagus without dysplasia: Secondary | ICD-10-CM | POA: Diagnosis present

## 2016-05-27 DIAGNOSIS — K922 Gastrointestinal hemorrhage, unspecified: Secondary | ICD-10-CM

## 2016-05-27 DIAGNOSIS — D62 Acute posthemorrhagic anemia: Secondary | ICD-10-CM | POA: Diagnosis present

## 2016-05-27 DIAGNOSIS — Z7982 Long term (current) use of aspirin: Secondary | ICD-10-CM | POA: Diagnosis not present

## 2016-05-27 DIAGNOSIS — K921 Melena: Secondary | ICD-10-CM | POA: Diagnosis present

## 2016-05-27 DIAGNOSIS — E1165 Type 2 diabetes mellitus with hyperglycemia: Secondary | ICD-10-CM | POA: Diagnosis present

## 2016-05-27 DIAGNOSIS — E1122 Type 2 diabetes mellitus with diabetic chronic kidney disease: Secondary | ICD-10-CM | POA: Diagnosis present

## 2016-05-27 DIAGNOSIS — K661 Hemoperitoneum: Secondary | ICD-10-CM | POA: Diagnosis present

## 2016-05-27 DIAGNOSIS — N179 Acute kidney failure, unspecified: Secondary | ICD-10-CM | POA: Diagnosis present

## 2016-05-27 DIAGNOSIS — Z79899 Other long term (current) drug therapy: Secondary | ICD-10-CM | POA: Diagnosis not present

## 2016-05-27 DIAGNOSIS — K5731 Diverticulosis of large intestine without perforation or abscess with bleeding: Secondary | ICD-10-CM | POA: Diagnosis present

## 2016-05-27 DIAGNOSIS — IMO0002 Reserved for concepts with insufficient information to code with codable children: Secondary | ICD-10-CM | POA: Diagnosis present

## 2016-05-27 DIAGNOSIS — Z791 Long term (current) use of non-steroidal anti-inflammatories (NSAID): Secondary | ICD-10-CM

## 2016-05-27 DIAGNOSIS — I9589 Other hypotension: Secondary | ICD-10-CM | POA: Diagnosis present

## 2016-05-27 DIAGNOSIS — E875 Hyperkalemia: Secondary | ICD-10-CM | POA: Diagnosis present

## 2016-05-27 DIAGNOSIS — D72829 Elevated white blood cell count, unspecified: Secondary | ICD-10-CM | POA: Diagnosis not present

## 2016-05-27 DIAGNOSIS — K625 Hemorrhage of anus and rectum: Secondary | ICD-10-CM | POA: Diagnosis present

## 2016-05-27 DIAGNOSIS — R58 Hemorrhage, not elsewhere classified: Secondary | ICD-10-CM | POA: Diagnosis present

## 2016-05-27 DIAGNOSIS — R579 Shock, unspecified: Secondary | ICD-10-CM | POA: Diagnosis not present

## 2016-05-27 DIAGNOSIS — N183 Chronic kidney disease, stage 3 (moderate): Secondary | ICD-10-CM | POA: Diagnosis present

## 2016-05-27 DIAGNOSIS — E039 Hypothyroidism, unspecified: Secondary | ICD-10-CM | POA: Diagnosis present

## 2016-05-27 DIAGNOSIS — K219 Gastro-esophageal reflux disease without esophagitis: Secondary | ICD-10-CM | POA: Diagnosis present

## 2016-05-27 DIAGNOSIS — J9600 Acute respiratory failure, unspecified whether with hypoxia or hypercapnia: Secondary | ICD-10-CM

## 2016-05-27 DIAGNOSIS — Z794 Long term (current) use of insulin: Secondary | ICD-10-CM | POA: Diagnosis not present

## 2016-05-27 LAB — CBC
HEMATOCRIT: 24 % — AB (ref 39.0–52.0)
HEMOGLOBIN: 7.7 g/dL — AB (ref 13.0–17.0)
MCH: 28.1 pg (ref 26.0–34.0)
MCHC: 32.1 g/dL (ref 30.0–36.0)
MCV: 87.6 fL (ref 78.0–100.0)
Platelets: 202 10*3/uL (ref 150–400)
RBC: 2.74 MIL/uL — ABNORMAL LOW (ref 4.22–5.81)
RDW: 15.5 % (ref 11.5–15.5)
WBC: 10.6 10*3/uL — ABNORMAL HIGH (ref 4.0–10.5)

## 2016-05-27 LAB — PROTIME-INR
INR: 1.16
PROTHROMBIN TIME: 14.9 s (ref 11.4–15.2)

## 2016-05-27 LAB — ABO/RH: ABO/RH(D): A POS

## 2016-05-27 LAB — COMPREHENSIVE METABOLIC PANEL
ALT: 13 U/L — ABNORMAL LOW (ref 17–63)
ANION GAP: 9 (ref 5–15)
AST: 33 U/L (ref 15–41)
Albumin: 2.6 g/dL — ABNORMAL LOW (ref 3.5–5.0)
Alkaline Phosphatase: 41 U/L (ref 38–126)
BILIRUBIN TOTAL: 1.1 mg/dL (ref 0.3–1.2)
BUN: 34 mg/dL — AB (ref 6–20)
CALCIUM: 8.3 mg/dL — AB (ref 8.9–10.3)
CO2: 17 mmol/L — AB (ref 22–32)
Chloride: 113 mmol/L — ABNORMAL HIGH (ref 101–111)
Creatinine, Ser: 2.08 mg/dL — ABNORMAL HIGH (ref 0.61–1.24)
GFR calc Af Amer: 33 mL/min — ABNORMAL LOW (ref >60)
GFR, EST NON AFRICAN AMERICAN: 28 mL/min — AB (ref >60)
GLUCOSE: 292 mg/dL — AB (ref 65–99)
Potassium: 6.5 mmol/L (ref 3.5–5.1)
Sodium: 139 mmol/L (ref 135–145)
TOTAL PROTEIN: 5.6 g/dL — AB (ref 6.5–8.1)

## 2016-05-27 LAB — CBG MONITORING, ED: Glucose-Capillary: 263 mg/dL — ABNORMAL HIGH (ref 65–99)

## 2016-05-27 LAB — PREPARE RBC (CROSSMATCH)

## 2016-05-27 MED ORDER — INSULIN ASPART 100 UNIT/ML IV SOLN
10.0000 [IU] | Freq: Once | INTRAVENOUS | Status: AC
  Administered 2016-05-27: 10 [IU] via INTRAVENOUS
  Filled 2016-05-27: qty 0.1

## 2016-05-27 MED ORDER — ASPIRIN 81 MG PO CHEW: 81.0000 mg | CHEWABLE_TABLET | Freq: Every day | ORAL | Status: DC

## 2016-05-27 MED ORDER — SODIUM CHLORIDE 0.9 % IV SOLN
10.0000 mL/h | Freq: Once | INTRAVENOUS | Status: AC
  Administered 2016-05-27: 10 mL/h via INTRAVENOUS

## 2016-05-27 MED ORDER — SIMVASTATIN 40 MG PO TABS
40.0000 mg | ORAL_TABLET | Freq: Every day | ORAL | Status: DC
  Administered 2016-05-28 – 2016-05-30 (×3): 40 mg via ORAL
  Filled 2016-05-27 (×3): qty 1

## 2016-05-27 MED ORDER — DEXTROSE 50 % IV SOLN
1.0000 | Freq: Once | INTRAVENOUS | Status: AC
  Administered 2016-05-27: 50 mL via INTRAVENOUS
  Filled 2016-05-27: qty 50

## 2016-05-27 MED ORDER — SODIUM CHLORIDE 0.9 % IV BOLUS (SEPSIS)
1000.0000 mL | Freq: Once | INTRAVENOUS | Status: AC
  Administered 2016-05-27: 1000 mL via INTRAVENOUS

## 2016-05-27 MED ORDER — FENTANYL CITRATE (PF) 100 MCG/2ML IJ SOLN: 25.0000 ug | INTRAMUSCULAR | Status: DC | PRN

## 2016-05-27 MED ORDER — PANTOPRAZOLE SODIUM 40 MG PO TBEC
80.0000 mg | DELAYED_RELEASE_TABLET | Freq: Every day | ORAL | Status: DC
  Administered 2016-05-28 – 2016-05-30 (×3): 80 mg via ORAL
  Filled 2016-05-27 (×3): qty 2

## 2016-05-27 MED ORDER — LEVOTHYROXINE SODIUM 125 MCG PO TABS
125.0000 ug | ORAL_TABLET | Freq: Every day | ORAL | Status: DC
  Administered 2016-05-29 – 2016-05-31 (×3): 125 ug via ORAL
  Filled 2016-05-27 (×4): qty 1

## 2016-05-27 MED ORDER — INSULIN ASPART 100 UNIT/ML ~~LOC~~ SOLN
0.0000 [IU] | SUBCUTANEOUS | Status: DC
  Administered 2016-05-27: 5 [IU] via SUBCUTANEOUS
  Administered 2016-05-28: 1 [IU] via SUBCUTANEOUS
  Administered 2016-05-28 (×4): 2 [IU] via SUBCUTANEOUS
  Administered 2016-05-29: 1 [IU] via SUBCUTANEOUS
  Administered 2016-05-29: 3 [IU] via SUBCUTANEOUS
  Administered 2016-05-29: 2 [IU] via SUBCUTANEOUS
  Administered 2016-05-29: 1 [IU] via SUBCUTANEOUS
  Administered 2016-05-30: 2 [IU] via SUBCUTANEOUS
  Administered 2016-05-30 (×2): 1 [IU] via SUBCUTANEOUS
  Administered 2016-05-30: 2 [IU] via SUBCUTANEOUS
  Administered 2016-05-31 (×2): 1 [IU] via SUBCUTANEOUS
  Filled 2016-05-27: qty 1

## 2016-05-27 MED ORDER — PLASMA-LYTE 148 IV SOLN: INTRAVENOUS | Status: DC

## 2016-05-27 NOTE — ED Provider Notes (Signed)
[facility] DEPT Provider Note   CSN: [id number] Arrival date & time: 05/27/16  1920     History   Chief Complaint Chief Complaint  Patient presents with  . GI Bleeding    HPI Zachary Cook is a 83 y.o. male.Plains of lightheadedness last night. Today lightheadedness worsened and he had a grossly bloody bowel movement this afternoon and was too weak to get up from the commode. He had left lower quadrant abdominal pain while sitting on the commode however he has no pain at present. No treatment prior to coming here. No other associated symptoms. He is presently asymptomatic except feels "cold"lightheadedness with attempting to stand improve with lying supine. HPI  Past Medical History:  Diagnosis Date  . Barrett's esophagus   . Diabetes mellitus without complication ([place])   . Diverticulosis   . Duodenitis   . GERD (gastroesophageal reflux disease)   . Hiatal hernia     Patient Active Problem List   Diagnosis Date Noted  . Adult onset hypothyroidism 12/20/2013  . Type II diabetes mellitus, uncontrolled ([place]) 12/20/2013  . Hypogonadism in male 12/02/2013  . Type 2 diabetes mellitus ([place]) 05/09/2011  . Hypertension 05/09/2011  . GERD (gastroesophageal reflux disease) 05/09/2011  . Hypogonadism male 05/09/2011    Past Surgical History:  Procedure Laterality Date  . VASECTOMY         Home Medications    Prior to Admission medications   Medication Sig Start Date End Date Taking? Authorizing Provider  aspirin 81 MG tablet Take 81 mg by mouth daily.    Historical Provider, MD  B-D UF III MINI PEN NEEDLES 31G X 5 MM MISC USE ONE PER DAY WITH VICTOZA 11/22/15   Elayne Snare, MD  Blood Glucose Monitoring Suppl (ACCU-CHEK NANO SMARTVIEW) W/DEVICE KIT  12/02/13   Historical Provider, MD  esomeprazole (NEXIUM) 40 MG capsule TAKE ONE CAPSULE BY MOUTH DAILY AT NOON 04/14/16   [name], MD  FLUAD 0.5 ML SUSY  11/30/15   Historical Provider, MD  glimepiride (AMARYL) 2  MG tablet TAKE 2 TABLETS(4 MG) BY MOUTH DAILY BEFORE DINNER Patient taking differently: TAKE 1 1/2 TABLETS BY MOUTH DAILY BEFORE DINNER 04/05/16   Elayne Snare, MD  glucose blood (ACCU-CHEK SMARTVIEW) test strip Test blood sugar twice daily 04/10/16   Elayne Snare, MD  Insulin Glargine (TOUJEO SOLOSTAR) 300 UNIT/ML SOPN Inject 12 Units into the skin daily. 04/10/16   Elayne Snare, MD  levothyroxine (SYNTHROID, LEVOTHROID) 125 MCG tablet Take 1 tablet (125 mcg total) by mouth daily before breakfast. 05/07/16   Elayne Snare, MD  liraglutide (VICTOZA) 18 MG/3ML SOPN ADMINISTER 1.2 MG UNDER THE SKIN DAILY 05/07/16   Elayne Snare, MD  metFORMIN (GLUCOPHAGE-XR) 750 MG 24 hr tablet TAKE 2 TABLETS(1500 MG) BY MOUTH DAILY WITH BREAKFAST 05/23/16   Elayne Snare, MD  simvastatin (ZOCOR) 40 MG tablet TAKE 1 TABLET(40 MG) BY MOUTH AT BEDTIME 02/04/16   Chelle Jeffery, PA-C  tamsulosin (FLOMAX) 0.4 MG CAPS capsule TAKE 1 CAPSULE BY MOUTH EVERY DAY 03/24/16   [name], MD    Family History Family History  Problem Relation Age of Onset  . Heart disease Mother   . Diabetes Mother   . Cancer Father     Social History Social History  Substance Use Topics  . Smoking status: Never Smoker  . Smokeless tobacco: Never Used  . Alcohol use No     Allergies   Patient has no known allergies.   Review of  Systems Review of Systems  HENT: Negative.   Respiratory: Negative.   Cardiovascular: Negative.   Gastrointestinal: Positive for abdominal pain and blood in stool.       No pain at present  Musculoskeletal: Negative.   Skin: Negative.   Allergic/Immunologic: Positive for immunocompromised state.       Diabetic  Neurological: Positive for weakness.  Psychiatric/Behavioral: Negative.   All other systems reviewed and are negative.    Physical Exam Updated Vital Signs BP 104/61 (BP Location: Left Arm)   Pulse 84   Temp 97.3 F (36.3 C) (Rectal)   Resp 19   Ht '5\' 6"'$  (1.676 m)   Wt 168 lb (76.2 kg)    SpO2 100%   BMI 27.12 kg/m   Physical Exam  Constitutional: He appears well-developed and well-nourished. No distress.  HENT:  Head: Normocephalic and atraumatic.  His membranes dry, pale  Eyes: Pupils are equal, round, and reactive to light.  Conjunctiva pale  Neck: Neck supple. No tracheal deviation present. No thyromegaly present.  Cardiovascular: Normal rate and regular rhythm.   No murmur heard. Pulmonary/Chest: Effort normal and breath sounds normal.  Abdominal: Soft. Bowel sounds are normal. He exhibits no distension. There is no tenderness.  Genitourinary: Penis normal.  Genitourinary Comments: Rectum grossly bloody maroon stool  Musculoskeletal: Normal range of motion. He exhibits no edema or tenderness.  Neurological: He is alert. Coordination normal.  Skin: Skin is warm and dry. No rash noted.  Psychiatric: He has a normal mood and affect.  Nursing note and vitals reviewed.    ED Treatments / Results  Labs (all labs ordered are listed, but only abnormal results are displayed) Labs Reviewed  COMPREHENSIVE METABOLIC PANEL  CBC  POC OCCULT BLOOD, ED  TYPE AND SCREEN    EKG  EKG Interpretation  Date/Time:  Monday May 27 2016 20:32:08 EDT Ventricular Rate:  75 PR Interval:    QRS Duration: 90 QT Interval:  414 QTC Calculation: 463 R Axis:   -33 Text Interpretation:  Sinus rhythm Inferior infarct, age indeterminate Lateral leads are also involved No old tracing to compare Confirmed by Winfred Leeds  MD, Sirius Woodford [phone]) on 05/27/2016 9:58:14 PM      Results for orders placed or performed during the hospital encounter of 05/27/16  Comprehensive metabolic panel  Result Value Ref Range   Sodium 139 135 - 145 mmol/L   Potassium 6.5 (HH) 3.5 - 5.1 mmol/L   Chloride 113 (H) 101 - 111 mmol/L   CO2 17 (L) 22 - 32 mmol/L   Glucose, Bld 292 (H) 65 - 99 mg/dL   BUN 34 (H) 6 - 20 mg/dL   Creatinine, Ser 2.08 (H) 0.61 - 1.24 mg/dL   Calcium 8.3 (L) 8.9 - 10.3 mg/dL    Total Protein 5.6 (L) 6.5 - 8.1 g/dL   Albumin 2.6 (L) 3.5 - 5.0 g/dL   AST 33 15 - 41 U/L   ALT 13 (L) 17 - 63 U/L   Alkaline Phosphatase 41 38 - 126 U/L   Total Bilirubin 1.1 0.3 - 1.2 mg/dL   GFR calc non Af Amer 28 (L) >60 mL/min   GFR calc Af Amer 33 (L) >60 mL/min   Anion gap 9 5 - 15  CBC  Result Value Ref Range   WBC 10.6 (H) 4.0 - 10.5 K/uL   RBC 2.74 (L) 4.22 - 5.81 MIL/uL   Hemoglobin 7.7 (L) 13.0 - 17.0 g/dL   HCT 24.0 (L) 39.0 - 52.0 %  MCV 87.6 78.0 - 100.0 fL   MCH 28.1 26.0 - 34.0 pg   MCHC 32.1 30.0 - 36.0 g/dL   RDW 15.5 11.5 - 15.5 %   Platelets 202 150 - 400 K/uL  Protime-INR  Result Value Ref Range   Prothrombin Time 14.9 11.4 - 15.2 seconds   INR 1.16   CBG monitoring, ED  Result Value Ref Range   Glucose-Capillary 263 (H) 65 - 99 mg/dL  Type and screen [place]  Result Value Ref Range   ABO/RH(D) A POS    Antibody Screen NEG    Sample Expiration 05/30/2016    Unit Number [id number]    Blood Component Type RED CELLS,LR    Unit division 00    Status of Unit ALLOCATED    Transfusion Status OK TO TRANSFUSE    Crossmatch Result Compatible    Unit Number [id number]    Blood Component Type RED CELLS,LR    Unit division 00    Status of Unit ISSUED    Transfusion Status OK TO TRANSFUSE    Crossmatch Result Compatible   Prepare RBC  Result Value Ref Range   Order Confirmation ORDER PROCESSED BY BLOOD BANK   ABO/Rh  Result Value Ref Range   ABO/RH(D) A POS   BPAM RBC  Result Value Ref Range   Blood Product Unit Number [id number]    Unit Type and Rh 4270    Blood Product Expiration Date 623762831517    ISSUE DATE / TIME [id number]    Blood Product Unit Number [id number]    PRODUCT CODE X3818E99    Unit Type and Rh 3716    Blood Product Expiration Date 967893810175    No results found.  Radiology No results found.  Procedures Procedures (including critical care time)  Medications Ordered in  ED Medications - No data to display 11:15 PM patient resting comfortably alert Glasgow Coma Score 15 after treatment with packed red cells transfused, normal saline intravenous bolus, D50 and treating his insulin insulin. D50 and intravenous insulin ordered for hyperkalemia. Dr. Silverio Decamp consulted from gastroenterology service. Agrees with packed red blood cell study in radiology suite in order to identify bleeding site given patient's history of diverticulosis. Dr. Alcario Drought consult from hospitalist service Initial Impression / Assessment and Plan / ED Course  I have reviewed the triage vital signs and the nursing notes.  Pertinent labs & imaging results that were available during my care of the patient were reviewed by me and considered in my medical decision making (see chart for details).    Patient will be admitted to stepdown unit   Final Clinical Impressions(s) / ED Diagnoses  Dx #1 acute lower gastrointestinal bleed #2 symptomatic anemia #3 hyperkalemia #4 hyperglycemia #5 acute kidney injury Final diagnoses:  None  CRITICAL CARE Performed by: Orlie Dakin Total critical care time: 60 minutes Critical care time was exclusive of separately billable procedures and treating other patients. Critical care was necessary to treat or prevent imminent or life-threatening deterioration. Critical care was time spent personally by me on the following activities: development of treatment plan with patient and/or surrogate as well as nursing, discussions with consultants, evaluation of patient's response to treatment, examination of patient, obtaining history from patient or surrogate, ordering and performing treatments and interventions, ordering and review of laboratory studies, ordering and review of radiographic studies, pulse oximetry and re-evaluation of patient's condition.  New Prescriptions New Prescriptions   No medications on file     Orlie Dakin, MD  05/27/16 2316  

## 2016-05-27 NOTE — ED Notes (Signed)
Called unit and nurse wants nurse to call back in a few minutes.

## 2016-05-27 NOTE — ED Notes (Signed)
Upon turning pt over, a moderate amount of blood is noted on the patients skin. Pt is dry and cool to the touch.

## 2016-05-27 NOTE — ED Notes (Signed)
Called pharmacy and they are trying to locate medication.

## 2016-05-27 NOTE — ED Notes (Signed)
Called unit to give report. Nurse will call back.

## 2016-05-27 NOTE — ED Triage Notes (Signed)
Pt from home, stated he had dizziness last night when he went to bed. Pt said he stated he felt "really cold." Felt better this morning,. Went to the store and the pt said the dizziness came back. Was sitting in his chair at home, had to use the restroom and stated he collapsed getting up and couldn't get up. LLQ pain. Pt had 1 bout emesis with EMS. 4 of Zofran given by EMS, tubing may have been leaking but no emesis since the zofran was given.

## 2016-05-27 NOTE — ED Notes (Signed)
Verbal order from Dr.Gardner to increase Blood administration rate to 173ml/hr and give 1000ML fluid bolus.

## 2016-05-27 NOTE — H&P (Signed)
History and Physical    Zachary Cook WUX:[id number] DOB: Aug 29, 1932 DOA: 05/27/2016  PCP: No PCP Per Patient  Patient coming from: Home  I have personally briefly reviewed patient's old medical records in [place]  Chief Complaint: BRBPR  HPI: Zachary Cook is a 83 y.o. male with medical history significant of diverticulosis, Barrett's esophagus.  Patient presents to the ED today with c/o lightheadedness last evening that has turned into grossly bloody BMs that have been ongoing since this afternoon.  Was too weak to get up off of the commode so wife called EMS.  LLQ abdominal pain while sitting on commode, got better after BM.  1 episode of bilious vomiting being reported at home by EMS though patient says no vomiting.  Stool is maroon in color with clots.  Bloody BMs ongoing in ED.   ED Course: HGB 7.7, K 6.5, getting insulin and D5, IVF for initial hypotension with BP 71, improved to 253G systolic.  AKI with creat of 2.0.   Review of Systems: As per HPI otherwise 10 point review of systems negative.   Past Medical History:  Diagnosis Date  . Barrett's esophagus   . Diabetes mellitus without complication ([place])   . Diverticulosis   . Duodenitis   . GERD (gastroesophageal reflux disease)   . Hiatal hernia     Past Surgical History:  Procedure Laterality Date  . VASECTOMY       reports that he has never smoked. He has never used smokeless tobacco. He reports that he does not drink alcohol or use drugs.  No Known Allergies  Family History  Problem Relation Age of Onset  . Heart disease Mother   . Diabetes Mother   . Cancer Father     Prior to Admission medications   Medication Sig Start Date End Date Taking? Authorizing Provider  aspirin 81 MG tablet Take 81 mg by mouth daily.   Yes Historical Provider, MD  esomeprazole (NEXIUM) 40 MG capsule TAKE ONE CAPSULE BY MOUTH DAILY AT NOON 04/14/16  Yes [name], MD  glimepiride (AMARYL) 2 MG tablet  TAKE 2 TABLETS(4 MG) BY MOUTH DAILY BEFORE DINNER Patient taking differently: take '2mg'$  by mouth with dinner 04/05/16  Yes Elayne Snare, MD  Insulin Glargine (TOUJEO SOLOSTAR) 300 UNIT/ML SOPN Inject 12 Units into the skin daily. 04/10/16  Yes Elayne Snare, MD  levothyroxine (SYNTHROID, LEVOTHROID) 125 MCG tablet Take 1 tablet (125 mcg total) by mouth daily before breakfast. 05/07/16  Yes Elayne Snare, MD  liraglutide (VICTOZA) 18 MG/3ML SOPN ADMINISTER 1.2 MG UNDER THE SKIN DAILY 05/07/16  Yes Elayne Snare, MD  metFORMIN (GLUCOPHAGE-XR) 750 MG 24 hr tablet TAKE 2 TABLETS(1500 MG) BY MOUTH DAILY WITH BREAKFAST 05/23/16  Yes Elayne Snare, MD  naproxen sodium (ANAPROX) 220 MG tablet Take 440 mg by mouth 2 (two) times daily with a meal.   Yes Historical Provider, MD  simvastatin (ZOCOR) 40 MG tablet TAKE 1 TABLET(40 MG) BY MOUTH AT BEDTIME 02/04/16  Yes Chelle Jeffery, PA-C  tamsulosin (FLOMAX) 0.4 MG CAPS capsule TAKE 1 CAPSULE BY MOUTH EVERY DAY 03/24/16  Yes [name], MD  B-D UF III MINI PEN NEEDLES 31G X 5 MM MISC USE ONE PER DAY WITH VICTOZA 11/22/15   Elayne Snare, MD  Blood Glucose Monitoring Suppl (ACCU-CHEK NANO SMARTVIEW) W/DEVICE KIT  12/02/13   Historical Provider, MD  FLUAD 0.5 ML SUSY  11/30/15   Historical Provider, MD  glucose blood (ACCU-CHEK SMARTVIEW) test  strip Test blood sugar twice daily 04/10/16   Elayne Snare, MD    Physical Exam: Vitals:   05/27/16 2033 05/27/16 2045 05/27/16 2100 05/27/16 2115  BP: (!) 83/45 (!) 101/55 119/60 96/61  Pulse: 73 74 73 72  Resp: (!) '22 15 19 17  '$ Temp:      TempSrc:      SpO2: 99% 100% 97% 98%  Weight:      Height:        Constitutional: NAD, calm, comfortable Eyes: PERRL, lids and conjunctivae normal ENMT: Mucous membranes are moist. Posterior pharynx clear of any exudate or lesions.Normal dentition.  Neck: normal, supple, no masses, no thyromegaly Respiratory: clear to auscultation bilaterally, no wheezing, no crackles. Normal respiratory effort.  No accessory muscle use.  Cardiovascular: Regular rate and rhythm, no murmurs / rubs / gallops. No extremity edema. 2+ pedal pulses. No carotid bruits.  Abdomen: no tenderness, no masses palpated. No hepatosplenomegaly. Bowel sounds positive.  Musculoskeletal: no clubbing / cyanosis. No joint deformity upper and lower extremities. Good ROM, no contractures. Normal muscle tone.  Skin: no rashes, lesions, ulcers. No induration Neurologic: CN 2-12 grossly intact. Sensation intact, DTR normal. Strength 5/5 in all 4.  Psychiatric: Normal judgment and insight. Alert and oriented x 3. Normal mood.    Labs on Admission: I have personally reviewed following labs and imaging studies  CBC:  Recent Labs Lab 05/27/16 1954  WBC 10.6*  HGB 7.7*  HCT 24.0*  MCV 87.6  PLT 127   Basic Metabolic Panel:  Recent Labs Lab 05/27/16 1954  NA 139  K 6.5*  CL 113*  CO2 17*  GLUCOSE 292*  BUN 34*  CREATININE 2.08*  CALCIUM 8.3*   GFR: Estimated Creatinine Clearance: 25.1 mL/min (A) (by C-G formula based on SCr of 2.08 mg/dL (H)). Liver Function Tests:  Recent Labs Lab 05/27/16 1954  AST 33  ALT 13*  ALKPHOS 41  BILITOT 1.1  PROT 5.6*  ALBUMIN 2.6*   No results for input(s): LIPASE, AMYLASE in the last 168 hours. No results for input(s): AMMONIA in the last 168 hours. Coagulation Profile:  Recent Labs Lab 05/27/16 1954  INR 1.16   Cardiac Enzymes: No results for input(s): CKTOTAL, CKMB, CKMBINDEX, TROPONINI in the last 168 hours. BNP (last 3 results) No results for input(s): PROBNP in the last 8760 hours. HbA1C: No results for input(s): HGBA1C in the last 72 hours. CBG: No results for input(s): GLUCAP in the last 168 hours. Lipid Profile: No results for input(s): CHOL, HDL, LDLCALC, TRIG, CHOLHDL, LDLDIRECT in the last 72 hours. Thyroid Function Tests: No results for input(s): TSH, T4TOTAL, FREET4, T3FREE, THYROIDAB in the last 72 hours. Anemia Panel: No results for  input(s): VITAMINB12, FOLATE, FERRITIN, TIBC, IRON, RETICCTPCT in the last 72 hours. Urine analysis:    Component Value Date/Time   COLORURINE YELLOW 03/23/2014 1016   APPEARANCEUR CLEAR 03/23/2014 1016   LABSPEC 1.015 03/23/2014 1016   PHURINE 5.5 03/23/2014 1016   GLUCOSEU >=1000 (A) 03/23/2014 1016   HGBUR NEGATIVE 03/23/2014 1016   BILIRUBINUR NEGATIVE 03/23/2014 1016   KETONESUR NEGATIVE 03/23/2014 1016   UROBILINOGEN 0.2 03/23/2014 1016   NITRITE NEGATIVE 03/23/2014 1016   LEUKOCYTESUR NEGATIVE 03/23/2014 1016    Radiological Exams on Admission: No results found.  EKG: Independently reviewed.  Assessment/Plan Principal Problem:   BRBPR (bright red blood per rectum) Active Problems:   Type II diabetes mellitus, uncontrolled ([facility])   Acute blood loss anemia   Hypotension due to blood  loss   AKI (acute kidney injury) ([place])    1. Hematochezia - Maroon colored stool, suspicious for diverticular bleed 1. Transfusion as below 2. EDP calling GI 3. But going ahead and ordering tagged RBC scan STAT tonight 4. If positive (suspect it will be), needs IR to attempt coiling 5. Tele monitor 2. Hypotension due to blood loss - likely causing his AKI 3. AKI - likely secondary to #2 1. IVF 2. Repeat BMP in AM 3. Transfuse 4. Hyperkalemia - 1. Getting insulin and D50 2. Repeat K ordered for midnight 5. Acute blood loss anemia - 2 unit PRBC transfusion ordered by EDP, repeat H/H after  DVT prophylaxis: SCDs Code Status: Full Family Communication: Wife at bedside Disposition Plan: Home after admit Consults called: EDP calling GI Admission status: Admit to inpatient   [place], [place] [facility] Pager [phone]  If 7AM-7PM, please contact day team taking care of patient [email or web address] Password [facility]  05/27/2016, 9:43 PM

## 2016-05-27 NOTE — ED Notes (Signed)
Pt to nuclear medicine.

## 2016-05-28 ENCOUNTER — Inpatient Hospital Stay (HOSPITAL_COMMUNITY): Payer: Medicare Other

## 2016-05-28 ENCOUNTER — Encounter (HOSPITAL_COMMUNITY)
Admission: EM | Disposition: A | Discharge status: Home / Self Care | Payer: Self-pay | Source: Home / Self Care | Attending: Family Medicine

## 2016-05-28 ENCOUNTER — Encounter (HOSPITAL_COMMUNITY): Payer: Self-pay | Admitting: Interventional Radiology

## 2016-05-28 DIAGNOSIS — K922 Gastrointestinal hemorrhage, unspecified: Secondary | ICD-10-CM

## 2016-05-28 DIAGNOSIS — Z794 Long term (current) use of insulin: Secondary | ICD-10-CM

## 2016-05-28 DIAGNOSIS — K5731 Diverticulosis of large intestine without perforation or abscess with bleeding: Principal | ICD-10-CM

## 2016-05-28 DIAGNOSIS — E1165 Type 2 diabetes mellitus with hyperglycemia: Secondary | ICD-10-CM

## 2016-05-28 DIAGNOSIS — D62 Acute posthemorrhagic anemia: Secondary | ICD-10-CM

## 2016-05-28 DIAGNOSIS — K921 Melena: Secondary | ICD-10-CM

## 2016-05-28 DIAGNOSIS — R579 Shock, unspecified: Secondary | ICD-10-CM

## 2016-05-28 DIAGNOSIS — K625 Hemorrhage of anus and rectum: Secondary | ICD-10-CM

## 2016-05-28 DIAGNOSIS — I9589 Other hypotension: Secondary | ICD-10-CM

## 2016-05-28 DIAGNOSIS — N179 Acute kidney failure, unspecified: Secondary | ICD-10-CM

## 2016-05-28 HISTORY — PX: IR GENERIC HISTORICAL: IMG1180011

## 2016-05-28 LAB — COMPREHENSIVE METABOLIC PANEL
ALT: 15 U/L — AB (ref 17–63)
AST: 20 U/L (ref 15–41)
Albumin: 2.3 g/dL — ABNORMAL LOW (ref 3.5–5.0)
Alkaline Phosphatase: 31 U/L — ABNORMAL LOW (ref 38–126)
Anion gap: 3 — ABNORMAL LOW (ref 5–15)
BILIRUBIN TOTAL: 0.5 mg/dL (ref 0.3–1.2)
BUN: 37 mg/dL — ABNORMAL HIGH (ref 6–20)
CO2: 19 mmol/L — ABNORMAL LOW (ref 22–32)
CREATININE: 1.96 mg/dL — AB (ref 0.61–1.24)
Calcium: 7.2 mg/dL — ABNORMAL LOW (ref 8.9–10.3)
Chloride: 117 mmol/L — ABNORMAL HIGH (ref 101–111)
GFR calc Af Amer: 35 mL/min — ABNORMAL LOW (ref >60)
GFR, EST NON AFRICAN AMERICAN: 30 mL/min — AB (ref >60)
Glucose, Bld: 172 mg/dL — ABNORMAL HIGH (ref 65–99)
Potassium: 5.7 mmol/L — ABNORMAL HIGH (ref 3.5–5.1)
SODIUM: 139 mmol/L (ref 135–145)
TOTAL PROTEIN: 4.3 g/dL — AB (ref 6.5–8.1)

## 2016-05-28 LAB — BASIC METABOLIC PANEL
ANION GAP: 3 — AB (ref 5–15)
BUN: 38 mg/dL — ABNORMAL HIGH (ref 6–20)
CALCIUM: 7.5 mg/dL — AB (ref 8.9–10.3)
CO2: 18 mmol/L — AB (ref 22–32)
Chloride: 117 mmol/L — ABNORMAL HIGH (ref 101–111)
Creatinine, Ser: 2.08 mg/dL — ABNORMAL HIGH (ref 0.61–1.24)
GFR calc non Af Amer: 28 mL/min — ABNORMAL LOW (ref >60)
GFR, EST AFRICAN AMERICAN: 33 mL/min — AB (ref >60)
Glucose, Bld: 177 mg/dL — ABNORMAL HIGH (ref 65–99)
Potassium: 6.5 mmol/L (ref 3.5–5.1)
Sodium: 138 mmol/L (ref 135–145)

## 2016-05-28 LAB — CBC
HCT: 22.2 % — ABNORMAL LOW (ref 39.0–52.0)
HEMATOCRIT: 19.2 % — AB (ref 39.0–52.0)
HEMOGLOBIN: 7.7 g/dL — AB (ref 13.0–17.0)
Hemoglobin: 6.6 g/dL — CL (ref 13.0–17.0)
MCH: 28.8 pg (ref 26.0–34.0)
MCH: 29.2 pg (ref 26.0–34.0)
MCHC: 34.4 g/dL (ref 30.0–36.0)
MCHC: 34.7 g/dL (ref 30.0–36.0)
MCV: 83.8 fL (ref 78.0–100.0)
MCV: 84.1 fL (ref 78.0–100.0)
Platelets: 160 10*3/uL (ref 150–400)
Platelets: 178 10*3/uL (ref 150–400)
RBC: 2.29 MIL/uL — ABNORMAL LOW (ref 4.22–5.81)
RBC: 2.64 MIL/uL — AB (ref 4.22–5.81)
RDW: 14.5 % (ref 11.5–15.5)
RDW: 14.7 % (ref 11.5–15.5)
WBC: 14.7 10*3/uL — ABNORMAL HIGH (ref 4.0–10.5)
WBC: 10.5 10*3/uL (ref 4.0–10.5)

## 2016-05-28 LAB — GLUCOSE, CAPILLARY
GLUCOSE-CAPILLARY: 175 mg/dL — AB (ref 65–99)
GLUCOSE-CAPILLARY: 130 mg/dL — AB (ref 65–99)
Glucose-Capillary: 183 mg/dL — ABNORMAL HIGH (ref 65–99)
Glucose-Capillary: 187 mg/dL — ABNORMAL HIGH (ref 65–99)
Glucose-Capillary: 162 mg/dL — ABNORMAL HIGH (ref 65–99)

## 2016-05-28 LAB — POTASSIUM: Potassium: 5.9 mmol/L — ABNORMAL HIGH (ref 3.5–5.1)

## 2016-05-28 LAB — TROPONIN I: Troponin I: <0.03 ng/mL (ref <0.03)

## 2016-05-28 LAB — MRSA PCR SCREENING: MRSA by PCR: NEGATIVE

## 2016-05-28 LAB — PROTIME-INR
INR: 1.26
Prothrombin Time: 15.9 seconds — ABNORMAL HIGH (ref 11.4–15.2)

## 2016-05-28 LAB — LACTIC ACID, PLASMA: Lactic Acid, Venous: 0.8 mmol/L (ref 0.5–1.9)

## 2016-05-28 LAB — PHOSPHORUS: Phosphorus: 3.5 mg/dL (ref 2.5–4.6)

## 2016-05-28 LAB — PROCALCITONIN: Procalcitonin: 0.25 ng/mL

## 2016-05-28 LAB — NA AND K (SODIUM & POTASSIUM), RAND UR
POTASSIUM UR: 67 mmol/L
SODIUM UR: 19 mmol/L

## 2016-05-28 LAB — MAGNESIUM: Magnesium: 1.3 mg/dL — ABNORMAL LOW (ref 1.7–2.4)

## 2016-05-28 LAB — PREPARE RBC (CROSSMATCH)

## 2016-05-28 SURGERY — COLONOSCOPY
Anesthesia: Moderate Sedation

## 2016-05-28 MED ORDER — FENTANYL CITRATE (PF) 100 MCG/2ML IJ SOLN
INTRAMUSCULAR | Status: AC
  Filled 2016-05-28: qty 2

## 2016-05-28 MED ORDER — SODIUM CHLORIDE 0.45 % IV SOLN
INTRAVENOUS | Status: DC
  Administered 2016-05-28 – 2016-05-29 (×2): via INTRAVENOUS

## 2016-05-28 MED ORDER — IOPAMIDOL (ISOVUE-300) INJECTION 61%
INTRAVENOUS | Status: AC | PRN
  Administered 2016-05-28: 78 mL via INTRA_ARTERIAL

## 2016-05-28 MED ORDER — SODIUM CHLORIDE 0.9 % IV SOLN
INTRAVENOUS | Status: AC
  Filled 2016-05-28: qty 10

## 2016-05-28 MED ORDER — INSULIN ASPART 100 UNIT/ML ~~LOC~~ SOLN: 10.0000 [IU] | Freq: Once | SUBCUTANEOUS | Status: DC

## 2016-05-28 MED ORDER — LIDOCAINE HCL 1 % IJ SOLN
INTRAMUSCULAR | Status: AC | PRN
  Administered 2016-05-28: 10 mL

## 2016-05-28 MED ORDER — SODIUM CHLORIDE 0.9 % IV SOLN
0.0000 ug/min | INTRAVENOUS | Status: DC
  Administered 2016-05-28 (×2): 20 ug/min via INTRAVENOUS
  Filled 2016-05-28 (×2): qty 1

## 2016-05-28 MED ORDER — DEXTROSE 50 % IV SOLN
INTRAVENOUS | Status: AC
Start: 2016-05-28 — End: 2016-05-28
  Filled 2016-05-28: qty 50

## 2016-05-28 MED ORDER — INSULIN ASPART 100 UNIT/ML IV SOLN: 10.0000 [IU] | Freq: Once | INTRAVENOUS | Status: DC

## 2016-05-28 MED ORDER — DEXTROSE 50 % IV SOLN
50.0000 mL | Freq: Once | INTRAVENOUS | Status: AC
  Administered 2016-05-28: 50 mL via INTRAVENOUS
  Filled 2016-05-28: qty 50

## 2016-05-28 MED ORDER — SODIUM CHLORIDE 0.9 % IV SOLN
1.0000 g | Freq: Once | INTRAVENOUS | Status: AC
  Administered 2016-05-28: 1 g via INTRAVENOUS

## 2016-05-28 MED ORDER — SODIUM CHLORIDE 0.9 % IV SOLN
1.0000 g | Freq: Once | INTRAVENOUS | Status: AC
  Administered 2016-05-28: 1 g via INTRAVENOUS
  Filled 2016-05-28: qty 10

## 2016-05-28 MED ORDER — SODIUM CHLORIDE 0.9 % IV SOLN
Freq: Once | INTRAVENOUS | Status: AC
  Administered 2016-05-28: 04:00:00 via INTRAVENOUS

## 2016-05-28 MED ORDER — SODIUM CHLORIDE 0.9 % IV SOLN
Freq: Once | INTRAVENOUS | Status: AC
  Administered 2016-05-28: 05:00:00 via INTRAVENOUS

## 2016-05-28 MED ORDER — LIDOCAINE HCL 1 % IJ SOLN
INTRAMUSCULAR | Status: AC
  Filled 2016-05-28: qty 20

## 2016-05-28 MED ORDER — IOPAMIDOL (ISOVUE-300) INJECTION 61%
INTRAVENOUS | Status: AC
  Filled 2016-05-28: qty 400

## 2016-05-28 MED ORDER — ORAL CARE MOUTH RINSE
15.0000 mL | Freq: Two times a day (BID) | OROMUCOSAL | Status: DC
  Administered 2016-05-29 – 2016-05-30 (×4): 15 mL via OROMUCOSAL

## 2016-05-28 MED ORDER — TECHNETIUM TC 99M-LABELED RED BLOOD CELLS IV KIT
21.1000 | PACK | Freq: Once | INTRAVENOUS | Status: AC | PRN
  Administered 2016-05-28: 21.1 via INTRAVENOUS

## 2016-05-28 MED ORDER — SODIUM CHLORIDE 0.9 % IV BOLUS (SEPSIS)
500.0000 mL | Freq: Once | INTRAVENOUS | Status: AC
  Administered 2016-05-28: 500 mL via INTRAVENOUS

## 2016-05-28 MED ORDER — SODIUM CHLORIDE 0.9 % IV SOLN
Freq: Once | INTRAVENOUS | Status: AC
  Administered 2016-05-28: 10:00:00 via INTRAVENOUS

## 2016-05-28 MED ORDER — FUROSEMIDE 10 MG/ML IJ SOLN
60.0000 mg | Freq: Once | INTRAMUSCULAR | Status: AC
  Administered 2016-05-28: 60 mg via INTRAVENOUS
  Filled 2016-05-28: qty 6

## 2016-05-28 MED ORDER — FENTANYL CITRATE (PF) 100 MCG/2ML IJ SOLN
INTRAMUSCULAR | Status: AC | PRN
  Administered 2016-05-28: 25 ug via INTRAVENOUS

## 2016-05-28 MED ORDER — INSULIN ASPART 100 UNIT/ML IV SOLN
10.0000 [IU] | Freq: Once | INTRAVENOUS | Status: AC
  Administered 2016-05-28: 10 [IU] via INTRAVENOUS

## 2016-05-28 MED ORDER — MIDAZOLAM HCL 2 MG/2ML IJ SOLN
INTRAMUSCULAR | Status: AC | PRN
  Administered 2016-05-28: 1 mg via INTRAVENOUS
  Administered 2016-05-28: 0.5 mg via INTRAVENOUS

## 2016-05-28 MED ORDER — MIDAZOLAM HCL 2 MG/2ML IJ SOLN
INTRAMUSCULAR | Status: AC
  Administered 2016-05-28: 1 mg
  Filled 2016-05-28: qty 4

## 2016-05-28 MED ORDER — MIDAZOLAM HCL 2 MG/2ML IJ SOLN
INTRAMUSCULAR | Status: AC
  Filled 2016-05-28: qty 4

## 2016-05-28 MED ORDER — SODIUM BICARBONATE 8.4 % IV SOLN
INTRAVENOUS | Status: DC
  Filled 2016-05-28: qty 100

## 2016-05-28 MED ORDER — INSULIN ASPART 100 UNIT/ML ~~LOC~~ SOLN
10.0000 [IU] | Freq: Once | SUBCUTANEOUS | Status: AC
  Administered 2016-05-28: 10 [IU] via INTRAVENOUS

## 2016-05-28 MED ORDER — FENTANYL CITRATE (PF) 100 MCG/2ML IJ SOLN
INTRAMUSCULAR | Status: AC
  Administered 2016-05-28: 25 ug
  Filled 2016-05-28: qty 4

## 2016-05-28 MED ORDER — SODIUM CHLORIDE 0.9 % IV SOLN: Freq: Once | INTRAVENOUS | Status: DC

## 2016-05-28 MED ORDER — SODIUM BICARBONATE 8.4 % IV SOLN
INTRAVENOUS | Status: DC
  Administered 2016-05-28: 04:00:00 via INTRAVENOUS
  Filled 2016-05-28: qty 150

## 2016-05-28 MED ORDER — DEXTROSE 50 % IV SOLN
1.0000 | Freq: Once | INTRAVENOUS | Status: AC
  Administered 2016-05-28: 50 mL via INTRAVENOUS

## 2016-05-28 MED ORDER — CHLORHEXIDINE GLUCONATE 0.12 % MT SOLN
15.0000 mL | Freq: Two times a day (BID) | OROMUCOSAL | Status: DC
  Administered 2016-05-29 – 2016-05-30 (×3): 15 mL via OROMUCOSAL
  Filled 2016-05-28: qty 15

## 2016-05-28 MED ORDER — PEG-KCL-NACL-NASULF-NA ASC-C 100 G PO SOLR
1.0000 | Freq: Once | ORAL | Status: DC
  Filled 2016-05-28: qty 1

## 2016-05-28 MED ORDER — SODIUM CHLORIDE 0.9 % IV SOLN
Freq: Once | INTRAVENOUS | Status: AC
  Administered 2016-05-28: 08:00:00 via INTRAVENOUS

## 2016-05-28 NOTE — Progress Notes (Signed)
PROGRESS NOTE    Zachary Cook  OEU:[id number] DOB: [birth year] DOA: 05/27/2016 PCP: No PCP Per Patient    Brief Narrative:  83 male presents with lower GI bleed, suspected diverticular in origin. Bleeding started day of admission associated with orthostatic symptoms. Developed hemorrhagic shock, required vasopressors and blood transfusion (2 units) one pool of plateltes. Consulted surgery, GI and critical care.     Assessment & Plan:   Principal Problem:   BRBPR (bright red blood per rectum) Active Problems:   Type II diabetes mellitus, uncontrolled (HCC)   Acute blood loss anemia   Hypotension due to blood loss   AKI (acute kidney injury) ([place])   Hyperkalemia   Hematochezia   Lower GI bleed   1. Hemorrhagic shock due to lower GI bleed. Patient sp 2 units prbc , 1 pool of platelets, about 3.5 liters bolus isotonic saline. Currently on phenylephrine per CVC. Last hb at 5.9, will continue H&H q 6 hours. Bleeding scan with active bleeding on the hepatic flexure/ proximal transverse colon, repeat angiography with positive bleeding into the lumen of the proximal colon from distal branch of the SMA, 63mm coli embolization.  Will continue IV fluids at a lower rate with bicarb drip for now, will check renal panel, if improved k, will change to isotonic saline.  2. Aki on ckd stage 3. K down to 5,9, will follow renal panel this pm, patient sp calcium gluconate and insulin therapy. Will avoid nephrotoxic medications, avoid hypotension.   3. T2DM, Patient npo due to GI bleeding, will continue glucose cover and monitoring with iss.   4. Hypothyroid. Will continue levothyroxine per home regimen, 125 mcg day.       DVT prophylaxis: SCD Code Status: Full  Family Communication: No family at the bedside  Disposition Plan: Home   Consultants:   Surgery  CC  GI  IR    Procedures:   Antimicrobials:   Subjective: Patient with positive blood per rectum, no nausea or vomiting,  no chest, dyspnea.   Objective: Vitals:   05/28/16 0815 05/28/16 0830 05/28/16 0845 05/28/16 0900  BP: (!) 85/52 (!) 98/58 (!) 107/54 (!) 96/54  Pulse:      Resp: 19 (!) 21 (!) 21 18  Temp:      TempSrc:      SpO2: 98% 100% 100% 100%  Weight:      Height:        Intake/Output Summary (Last 24 hours) at 05/28/16 0921 Last data filed at 05/28/16 0900  Gross per 24 hour  Intake          6876.33 ml  Output                0 ml  Net          6876.33 ml   Filed Weights   05/27/16 1936 05/28/16 0300  Weight: 76.2 kg (168 lb) 79.5 kg (175 lb 4.3 oz)    Examination:  General exam: deconditioned E EN: mild pallor, oral mucosa moist. No icterus.  Respiratory system: Clear to auscultation. Respiratory effort normal. No wheezing, rales or rhonchi.  Cardiovascular system: S1 & S2 heard, RRR. No JVD, murmurs, rubs, gallops or clicks. No pedal edema. Gastrointestinal system: Abdomen is nondistended, soft and nontender. No organomegaly or masses felt. Normal bowel sounds heard. Central nervous system: Alert and oriented. No focal neurological deficits. Extremities: Symmetric 5 x 5 power. Skin: No rashes, lesions or ulcers .     Data Reviewed: I have  personally reviewed following labs and imaging studies  CBC:  Recent Labs Lab 05/27/16 1954 05/28/16 0620  WBC 10.6* 14.7*  HGB 7.7* 6.6*  HCT 24.0* 19.2*  MCV 87.6 83.8  PLT 202 825   Basic Metabolic Panel:  Recent Labs Lab 05/27/16 1954 05/28/16 0321 05/28/16 0620  NA 139 138  --   K 6.5* 6.5* 5.9*  CL 113* 117*  --   CO2 17* 18*  --   GLUCOSE 292* 177*  --   BUN 34* 38*  --   CREATININE 2.08* 2.08*  --   CALCIUM 8.3* 7.5*  --    GFR: Estimated Creatinine Clearance: 28.2 mL/min (A) (by C-G formula based on SCr of 2.08 mg/dL (H)). Liver Function Tests:  Recent Labs Lab 05/27/16 1954  AST 33  ALT 13*  ALKPHOS 41  BILITOT 1.1  PROT 5.6*  ALBUMIN 2.6*   No results for input(s): LIPASE, AMYLASE in the last  168 hours. No results for input(s): AMMONIA in the last 168 hours. Coagulation Profile:  Recent Labs Lab 05/27/16 1954  INR 1.16   Cardiac Enzymes: No results for input(s): CKTOTAL, CKMB, CKMBINDEX, TROPONINI in the last 168 hours. BNP (last 3 results) No results for input(s): PROBNP in the last 8760 hours. HbA1C: No results for input(s): HGBA1C in the last 72 hours. CBG:  Recent Labs Lab 05/27/16 2254 05/28/16 0756  GLUCAP 263* 183*   Lipid Profile: No results for input(s): CHOL, HDL, LDLCALC, TRIG, CHOLHDL, LDLDIRECT in the last 72 hours. Thyroid Function Tests: No results for input(s): TSH, T4TOTAL, FREET4, T3FREE, THYROIDAB in the last 72 hours. Anemia Panel: No results for input(s): VITAMINB12, FOLATE, FERRITIN, TIBC, IRON, RETICCTPCT in the last 72 hours. Sepsis Labs: No results for input(s): PROCALCITON, LATICACIDVEN in the last 168 hours.  Recent Results (from the past 240 hour(s))  MRSA PCR Screening     Status: None   Collection Time: 05/28/16  2:59 AM  Result Value Ref Range Status   MRSA by PCR NEGATIVE NEGATIVE Final    Comment:        The GeneXpert MRSA Assay (FDA approved for NASAL specimens only), is one component of a comprehensive MRSA colonization surveillance program. It is not intended to diagnose MRSA infection nor to guide or monitor treatment for MRSA infections.          Radiology Studies: Nm Gi Blood Loss  Result Date: 05/28/2016 CLINICAL DATA:  83 year old male with bloody stools. EXAM: NUCLEAR MEDICINE GASTROINTESTINAL BLEEDING SCAN TECHNIQUE: Sequential abdominal images were obtained following intravenous administration of Tc-47m labeled red blood cells. RADIOPHARMACEUTICALS:  21.1 mCi Tc-29m in-vitro labeled red cells. COMPARISON:  None. FINDINGS: There is physiologic uptake in the liver spleen and cardiac blood pool as well as uptake within the aorta and IVC. There is radiotracer activity in the right upper abdomen conforming to  the location of the hepatic flexure and proximal transverse colon consistent with active GI bleed. IMPRESSION: Active GI bleed in the hepatic flexure/ proximal transverse colon. These results were called by telephone at the time of interpretation on 05/28/2016 at 2:36 am to Dr. Jennette Kettle , who verbally acknowledged these results. Electronically Signed   By: Anner Crete M.D.   On: 05/28/2016 02:58   Ir Angiogram Visceral Selective  Result Date: 05/28/2016 INDICATION: Acute lower gastrointestinal tract bleeding with positive nuclear medicine bleeding scan localizing to the region of the hepatic flexure/ proximal transverse colon. EXAM: 1. ULTRASOUND GUIDANCE FOR VASCULAR ACCESS OF THE RIGHT COMMON  FEMORAL ARTERY 2. SELECTIVE VISCERAL ARTERIOGRAPHY OF THE SUPERIOR MESENTERIC ARTERY 3. ADDITIONAL SELECTIVE ARTERIOGRAPHY OF THE SUPERIOR MESENTERIC ARTERY MEDICATIONS: None ANESTHESIA/SEDATION: Moderate (conscious) sedation was employed during this procedure. A total of Versed 1.0 mg and Fentanyl 25 mcg was administered intravenously. Moderate Sedation Time: 40 minutes. The patient's level of consciousness and vital signs were monitored continuously by radiology nursing throughout the procedure under my direct supervision. CONTRAST:  78 mL Isovue-300 FLUOROSCOPY TIME:  Fluoroscopy Time: 7 minutes and 30 seconds. 595 mGy. COMPLICATIONS: None immediate. PROCEDURE: Informed consent was obtained from the patient following explanation of the procedure, risks, benefits and alternatives. The patient understands, agrees and consents for the procedure. All questions were addressed. A time out was performed prior to the initiation of the procedure. Maximal barrier sterile technique utilized including caps, mask, sterile gowns, sterile gloves, large sterile drape, hand hygiene, and Betadine prep. Ultrasound was used to confirm patency of the right common femoral artery. Under ultrasound guidance, access of the artery was  performed with a 21 gauge needle and micropuncture set. Ultrasound image documentation was performed. A 5 French vascular sheath was placed. A 5 French Cobra catheter was then advanced into the abdominal aorta an used to selectively catheterize the superior mesenteric artery. Selective arteriography was performed. The catheter was then further advanced passed a pancreaticoduodenal trunk and additional selective arteriography performed in 2 different projections. The Cobra catheter and additional Sos catheter were used to attempt catheterization of the inferior mesenteric artery. Catheters were then removed. Oblique contrast injection was performed at the level of the femoral arteriotomy. Arteriotomy closure was performed with the Cordis Exoseal device. FINDINGS: SMA arteriography initially suggested potentially focal contrast accumulation along the medial aspect of the cecum in a rounded configuration. This was investigated under real-time fluoroscopy and did not show definitive contrast accumulation and was felt to relate to bowel gas. There also was no transit of density into the lumen of the adjacent cecum. No vascular malformations are identified. The IMA origin could not be located or selectively catheterized. IMPRESSION: No definitive active arterial bleeding identified in the SMA distribution. Focal area of density along the medial aspect of the cecum during SMA arteriography did not show persistence with fluoroscopy or transit in the bowel lumen and was felt to most likely relate to bowel gas rather than contrast extravasation. Electronically Signed   By: Aletta Edouard M.D.   On: 05/28/2016 08:35   Ir Angiogram Selective Each Additional Vessel  Result Date: 05/28/2016 INDICATION: Acute lower gastrointestinal tract bleeding with positive nuclear medicine bleeding scan localizing to the region of the hepatic flexure/ proximal transverse colon. EXAM: 1. ULTRASOUND GUIDANCE FOR VASCULAR ACCESS OF THE RIGHT  COMMON FEMORAL ARTERY 2. SELECTIVE VISCERAL ARTERIOGRAPHY OF THE SUPERIOR MESENTERIC ARTERY 3. ADDITIONAL SELECTIVE ARTERIOGRAPHY OF THE SUPERIOR MESENTERIC ARTERY MEDICATIONS: None ANESTHESIA/SEDATION: Moderate (conscious) sedation was employed during this procedure. A total of Versed 1.0 mg and Fentanyl 25 mcg was administered intravenously. Moderate Sedation Time: 40 minutes. The patient's level of consciousness and vital signs were monitored continuously by radiology nursing throughout the procedure under my direct supervision. CONTRAST:  78 mL Isovue-300 FLUOROSCOPY TIME:  Fluoroscopy Time: 7 minutes and 30 seconds. 595 mGy. COMPLICATIONS: None immediate. PROCEDURE: Informed consent was obtained from the patient following explanation of the procedure, risks, benefits and alternatives. The patient understands, agrees and consents for the procedure. All questions were addressed. A time out was performed prior to the initiation of the procedure. Maximal barrier sterile technique utilized including  caps, mask, sterile gowns, sterile gloves, large sterile drape, hand hygiene, and Betadine prep. Ultrasound was used to confirm patency of the right common femoral artery. Under ultrasound guidance, access of the artery was performed with a 21 gauge needle and micropuncture set. Ultrasound image documentation was performed. A 5 French vascular sheath was placed. A 5 French Cobra catheter was then advanced into the abdominal aorta an used to selectively catheterize the superior mesenteric artery. Selective arteriography was performed. The catheter was then further advanced passed a pancreaticoduodenal trunk and additional selective arteriography performed in 2 different projections. The Cobra catheter and additional Sos catheter were used to attempt catheterization of the inferior mesenteric artery. Catheters were then removed. Oblique contrast injection was performed at the level of the femoral arteriotomy. Arteriotomy  closure was performed with the Cordis Exoseal device. FINDINGS: SMA arteriography initially suggested potentially focal contrast accumulation along the medial aspect of the cecum in a rounded configuration. This was investigated under real-time fluoroscopy and did not show definitive contrast accumulation and was felt to relate to bowel gas. There also was no transit of density into the lumen of the adjacent cecum. No vascular malformations are identified. The IMA origin could not be located or selectively catheterized. IMPRESSION: No definitive active arterial bleeding identified in the SMA distribution. Focal area of density along the medial aspect of the cecum during SMA arteriography did not show persistence with fluoroscopy or transit in the bowel lumen and was felt to most likely relate to bowel gas rather than contrast extravasation. Electronically Signed   By: Aletta Edouard M.D.   On: 05/28/2016 08:35   Ir US Guide Vasc Access Right  Result Date: 05/28/2016 INDICATION: Acute lower gastrointestinal tract bleeding with positive nuclear medicine bleeding scan localizing to the region of the hepatic flexure/ proximal transverse colon. EXAM: 1. ULTRASOUND GUIDANCE FOR VASCULAR ACCESS OF THE RIGHT COMMON FEMORAL ARTERY 2. SELECTIVE VISCERAL ARTERIOGRAPHY OF THE SUPERIOR MESENTERIC ARTERY 3. ADDITIONAL SELECTIVE ARTERIOGRAPHY OF THE SUPERIOR MESENTERIC ARTERY MEDICATIONS: None ANESTHESIA/SEDATION: Moderate (conscious) sedation was employed during this procedure. A total of Versed 1.0 mg and Fentanyl 25 mcg was administered intravenously. Moderate Sedation Time: 40 minutes. The patient's level of consciousness and vital signs were monitored continuously by radiology nursing throughout the procedure under my direct supervision. CONTRAST:  78 mL Isovue-300 FLUOROSCOPY TIME:  Fluoroscopy Time: 7 minutes and 30 seconds. 595 mGy. COMPLICATIONS: None immediate. PROCEDURE: Informed consent was obtained from the  patient following explanation of the procedure, risks, benefits and alternatives. The patient understands, agrees and consents for the procedure. All questions were addressed. A time out was performed prior to the initiation of the procedure. Maximal barrier sterile technique utilized including caps, mask, sterile gowns, sterile gloves, large sterile drape, hand hygiene, and Betadine prep. Ultrasound was used to confirm patency of the right common femoral artery. Under ultrasound guidance, access of the artery was performed with a 21 gauge needle and micropuncture set. Ultrasound image documentation was performed. A 5 French vascular sheath was placed. A 5 French Cobra catheter was then advanced into the abdominal aorta an used to selectively catheterize the superior mesenteric artery. Selective arteriography was performed. The catheter was then further advanced passed a pancreaticoduodenal trunk and additional selective arteriography performed in 2 different projections. The Cobra catheter and additional Sos catheter were used to attempt catheterization of the inferior mesenteric artery. Catheters were then removed. Oblique contrast injection was performed at the level of the femoral arteriotomy. Arteriotomy closure was performed with the  Cordis Exoseal device. FINDINGS: SMA arteriography initially suggested potentially focal contrast accumulation along the medial aspect of the cecum in a rounded configuration. This was investigated under real-time fluoroscopy and did not show definitive contrast accumulation and was felt to relate to bowel gas. There also was no transit of density into the lumen of the adjacent cecum. No vascular malformations are identified. The IMA origin could not be located or selectively catheterized. IMPRESSION: No definitive active arterial bleeding identified in the SMA distribution. Focal area of density along the medial aspect of the cecum during SMA arteriography did not show  persistence with fluoroscopy or transit in the bowel lumen and was felt to most likely relate to bowel gas rather than contrast extravasation. Electronically Signed   By: Aletta Edouard M.D.   On: 05/28/2016 08:35        Scheduled Meds: . sodium chloride   Intravenous Once  . sodium chloride   Intravenous Once  . sodium chloride   Intravenous Once  . calcium gluconate  1 g Intravenous Once  . dextrose  50 mL Intravenous Once  . insulin aspart  0-9 Units Subcutaneous Q4H  . insulin aspart  10 Units Subcutaneous Once  . insulin aspart  10 Units Intravenous Once  . iopamidol      . levothyroxine  125 mcg Oral QAC breakfast  . lidocaine      . pantoprazole  80 mg Oral Q1200  . simvastatin  40 mg Oral q1800  . calcium gluconate 1 GM IV       Continuous Infusions: . phenylephrine (NEO-SYNEPHRINE) Adult infusion 40 mcg/min (05/28/16 0915)  .  sodium bicarbonate  infusion 1000 mL 50 mL/hr at 05/28/16 0093     LOS: 1 day        Tawni Millers, MD [facility] Pager [phone]  If 7PM-7AM, please contact night-coverage [email or web address] Password [facility] 05/28/2016, 9:21 AM

## 2016-05-28 NOTE — ED Notes (Signed)
Scan completed

## 2016-05-28 NOTE — Progress Notes (Signed)
Pt returned from IR hypotensive 70s/40s MAP 58. Dr. Fabio Neighbors notified. 1L NS bolus started in procedure finishing up.

## 2016-05-28 NOTE — Care Management Note (Signed)
Case Management Note  Patient Details  Name: Zachary Cook MRN: [id number] Date of Birth: 1932/05/05  Subjective/Objective:    Active GI bleed and anemia                Action/Plan: From Home with spouse Date:  May 28, 2016 Chart reviewed for concurrent status and case management needs. Will continue to follow patient progress. Discharge Planning: following for needs Expected discharge date: [id number] Velva Harman, BSN, [place], [place]   Expected Discharge Date:   (unknown)               Expected Discharge Plan:  Home/Self Care  In-House Referral:     Discharge planning Services     Post Acute Care Choice:    Choice offered to:     DME Arranged:    DME Agency:     HH Arranged:    [place] Agency:     Status of Service:  In process, will continue to follow  If discussed at Long Length of Stay Meetings, dates discussed:    Additional Comments:  [name], RN 05/28/2016, 10:09 AM

## 2016-05-28 NOTE — Consult Note (Signed)
Chief Complaint: Patient was seen in consultation today for GI bleed at the request of Dr. Jennette Kettle  Referring Physician(s): Jennette Kettle  Patient Status: [facility] - In-pt  History of Present Illness: Zachary Cook is a 83 y.o. male presenting with significant lower GI bleed with passage of maroon stools and clots.  NM GI bleeding scan positive approximately 2 hours ago for active bleeding in region of hepatic flexure of colon.  Has now received 2 U PRBC's and 3L of saline.  BP as low as 70's/50's.  Now 100/60 range.  Not syncopal in bed.  Cr 2.1 on admission.  Past Medical History:  Diagnosis Date  . Barrett's esophagus   . Diabetes mellitus without complication ([place])   . Diverticulosis   . Duodenitis   . GERD (gastroesophageal reflux disease)   . Hiatal hernia     Past Surgical History:  Procedure Laterality Date  . VASECTOMY      Allergies: Patient has no known allergies.  Medications: Prior to Admission medications   Medication Sig Start Date End Date Taking? Authorizing Provider  aspirin 81 MG tablet Take 81 mg by mouth daily.   Yes Historical Provider, MD  esomeprazole (NEXIUM) 40 MG capsule TAKE ONE CAPSULE BY MOUTH DAILY AT NOON 04/14/16  Yes [name], MD  glimepiride (AMARYL) 2 MG tablet TAKE 2 TABLETS(4 MG) BY MOUTH DAILY BEFORE DINNER Patient taking differently: take '2mg'$  by mouth with dinner 04/05/16  Yes Elayne Snare, MD  Insulin Glargine (TOUJEO SOLOSTAR) 300 UNIT/ML SOPN Inject 12 Units into the skin daily. 04/10/16  Yes Elayne Snare, MD  levothyroxine (SYNTHROID, LEVOTHROID) 125 MCG tablet Take 1 tablet (125 mcg total) by mouth daily before breakfast. 05/07/16  Yes Elayne Snare, MD  liraglutide (VICTOZA) 18 MG/3ML SOPN ADMINISTER 1.2 MG UNDER THE SKIN DAILY 05/07/16  Yes Elayne Snare, MD  metFORMIN (GLUCOPHAGE-XR) 750 MG 24 hr tablet TAKE 2 TABLETS(1500 MG) BY MOUTH DAILY WITH BREAKFAST 05/23/16  Yes Elayne Snare, MD  naproxen sodium (ANAPROX) 220 MG tablet  Take 440 mg by mouth 2 (two) times daily with a meal.   Yes Historical Provider, MD  simvastatin (ZOCOR) 40 MG tablet TAKE 1 TABLET(40 MG) BY MOUTH AT BEDTIME 02/04/16  Yes Chelle Jeffery, PA-C  tamsulosin (FLOMAX) 0.4 MG CAPS capsule TAKE 1 CAPSULE BY MOUTH EVERY DAY 03/24/16  Yes [name], MD  B-D UF III MINI PEN NEEDLES 31G X 5 MM MISC USE ONE PER DAY WITH VICTOZA 11/22/15   Elayne Snare, MD  Blood Glucose Monitoring Suppl (ACCU-CHEK NANO SMARTVIEW) W/DEVICE KIT  12/02/13   Historical Provider, MD  FLUAD 0.5 ML SUSY  11/30/15   Historical Provider, MD  glucose blood (ACCU-CHEK SMARTVIEW) test strip Test blood sugar twice daily 04/10/16   Elayne Snare, MD     Family History  Problem Relation Age of Onset  . Heart disease Mother   . Diabetes Mother   . Cancer Father     Social History   Social History  . Marital status: Married    Spouse name: N/A  . Number of children: N/A  . Years of education: N/A   Social History Main Topics  . Smoking status: Never Smoker  . Smokeless tobacco: Never Used  . Alcohol use No  . Drug use: No  . Sexual activity: Not Asked   Other Topics Concern  . None   Social History Narrative  . None     Review of Systems: A  12 point ROS discussed and pertinent positives are indicated in the HPI above.  All other systems are negative.  Review of Systems  Respiratory: Negative.   Cardiovascular: Negative.   Gastrointestinal: Positive for blood in stool. Negative for abdominal distention, abdominal pain, nausea and vomiting.  Genitourinary: Negative.   Musculoskeletal: Negative.   Neurological: Negative.     Vital Signs: BP (!) 144/75   Pulse 72   Temp 98.3 F (36.8 C) (Oral)   Resp (!) 28   Ht '5\' 7"'$  (1.702 m)   Wt 175 lb 4.3 oz (79.5 kg)   SpO2 97%   BMI 27.45 kg/m   Physical Exam  Constitutional: He is oriented to person, place, and time. No distress.  Pulmonary/Chest: Effort normal.  Abdominal: Soft. He exhibits no distension.  There is no tenderness. There is no rebound and no guarding.  Musculoskeletal: He exhibits no edema.  Neurological: He is alert and oriented to person, place, and time.  Skin: He is not diaphoretic.    Mallampati Score:  MD Evaluation Airway: WNL Heart: WNL Abdomen: WNL Chest/ Lungs: WNL ASA  Classification: 3 Mallampati/Airway Score: One  Imaging: Nm Gi Blood Loss  Result Date: 05/28/2016 CLINICAL DATA:  83 year old male with bloody stools. EXAM: NUCLEAR MEDICINE GASTROINTESTINAL BLEEDING SCAN TECHNIQUE: Sequential abdominal images were obtained following intravenous administration of Tc-12mlabeled red blood cells. RADIOPHARMACEUTICALS:  21.1 mCi Tc-984mn-vitro labeled red cells. COMPARISON:  None. FINDINGS: There is physiologic uptake in the liver spleen and cardiac blood pool as well as uptake within the aorta and IVC. There is radiotracer activity in the right upper abdomen conforming to the location of the hepatic flexure and proximal transverse colon consistent with active GI bleed. IMPRESSION: Active GI bleed in the hepatic flexure/ proximal transverse colon. These results were called by telephone at the time of interpretation on 05/28/2016 at 2:36 am to Dr. JAJennette Kettle who verbally acknowledged these results. Electronically Signed   By: ArAnner Crete.D.   On: 05/28/2016 02:58    Labs:  CBC:  Recent Labs  05/27/16 1954  WBC 10.6*  HGB 7.7*  HCT 24.0*  PLT 202    COAGS:  Recent Labs  05/27/16 1954  INR 1.16    BMP:  Recent Labs  04/05/16 1042 05/03/16 1103 05/27/16 1954 05/28/16 0321  NA 141 137 139 138  K 4.8 4.9 6.5* 6.5*  CL 105 104 113* 117*  CO2 28 27 17* 18*  GLUCOSE 217* 154* 292* 177*  BUN 34* 21 34* 38*  CALCIUM 9.4 9.7 8.3* 7.5*  CREATININE 1.48 1.33 2.08* 2.08*  GFRNONAA  --   --  28* 28*  GFRAA  --   --  33* 33*    LIVER FUNCTION TESTS:  Recent Labs  03/08/16 0943 05/27/16 1954  BILITOT 0.8 1.1  AST 13 33  ALT 10 13*    ALKPHOS 59 41  PROT 7.3 5.6*  ALBUMIN 4.2 2.6*     Assessment and Plan:  Emergent arteriography now for active LGIB with possible embolization. Made patient aware angiography may be negative if not actively bleeding during procedure.  Will try to limit contrast load given AKI.  Being treated now for hyperkalemia.  Risks and Benefits discussed with the patient and his wife including, but not limited to bleeding, infection, vascular injury or contrast induced renal failure. All of the patient's and wife's questions were answered, patient is agreeable to proceed. Consent signed by wife and in chart.  Thank  you for this interesting consult.  I greatly enjoyed meeting Zachary Cook and look forward to participating in their care.  A copy of this report was sent to the requesting provider on this date.  Electronically SignedAletta Edouard T 05/28/2016, 4:47 AM   I spent a total of 20 Minutes in face to face in clinical consultation, greater than 50% of which was counseling/coordinating care for gastrointestinal bleeding.

## 2016-05-28 NOTE — ED Notes (Signed)
Delay on retrieving blood sugar due to patient being in nuclear radiology.

## 2016-05-28 NOTE — Progress Notes (Addendum)
Repeat CBC is being drawn right now.  Preparing 2 more PRBC units in anticipation of needing transfusion.  Will let PCCM know patient probably needs to be seen this AM.  Is currently having large bloody BM in room.  1) spoke with PCCM they will see in AM 2) phenylepherine 3) 2 unit PRBC transfusion ordered for repeat HGB 6.6 this AM 4) PCCM recommended 1 unit platelets despite platelets of 160 due to h/o ASA use, will order this. 5) repeat INR ordered 6) need to touch base with GI, and call general surgery as he may need surgical intervention. 7) spoke with general surgery who will see him in consult 8) spoke with Dr. Silverio Decamp again, who will have day team see patient, probably attempt rapid bowel prep and colonoscopy later today.  She thinks it is highly likely to be diverticular bleed in spite of being at hepatic flexure (patient has diverticulosis of entire colon on prior colonoscopy).  But feels that attempt at endoscopic control although very low yield is worth attempting before subjecting patient to major surgery (ie partial colectomy)

## 2016-05-28 NOTE — Consult Note (Signed)
PULMONARY / CRITICAL CARE MEDICINE   Name: Zachary Cook MRN: [id number] DOB: 07/18/1932    ADMISSION DATE:  05/27/2016 CONSULTATION DATE:  05/28/16  REFERRING MD:  Lurline Del MD  CHIEF COMPLAINT:  GIB, hemorrhagic shock  HISTORY OF PRESENT ILLNESS:   83 year old with history of Barrett's, diabetes, diverticulosis, GERD, hiatal hernia. Last colonoscopy in 2011 showed severe diverticulosis from sigmoid to descending colon. Admitted on 3/19 with bloody bowel movements, left lower quadrant pain, one episode of bilious vomiting. Initial labs noted for hemoglobin 7.7. K 6.5, creatinine of 2.  He underwent a tagged RBC scan which showed active bleeding in the hepatic flexure of the colon. Subsequent evaluation by interventional radiology did not show any active bleeding and no interventions performed. He returns to the ICU with hypotension BP systolic in the 99M. He has received 2 units of PRBCs and 3 L of saline so far. He has also received 1 unit of platelets due to history of aspirin use.  PAST MEDICAL HISTORY :  He  has a past medical history of Barrett's esophagus; Diabetes mellitus without complication ([place]); Diverticulosis; Duodenitis; GERD (gastroesophageal reflux disease); and Hiatal hernia.  PAST SURGICAL HISTORY: He  has a past surgical history that includes Vasectomy.  No Known Allergies  No current facility-administered medications on file prior to encounter.    Current Outpatient Prescriptions on File Prior to Encounter  Medication Sig  . aspirin 81 MG tablet Take 81 mg by mouth daily.  Marland Kitchen esomeprazole (NEXIUM) 40 MG capsule TAKE ONE CAPSULE BY MOUTH DAILY AT NOON  . glimepiride (AMARYL) 2 MG tablet TAKE 2 TABLETS(4 MG) BY MOUTH DAILY BEFORE DINNER (Patient taking differently: take 38m by mouth with dinner)  . Insulin Glargine (TOUJEO SOLOSTAR) 300 UNIT/ML SOPN Inject 12 Units into the skin daily.  .Marland Kitchenlevothyroxine (SYNTHROID, LEVOTHROID) 125 MCG tablet Take 1 tablet (125 mcg  total) by mouth daily before breakfast.  . liraglutide (VICTOZA) 18 MG/3ML SOPN ADMINISTER 1.2 MG UNDER THE SKIN DAILY  . metFORMIN (GLUCOPHAGE-XR) 750 MG 24 hr tablet TAKE 2 TABLETS(1500 MG) BY MOUTH DAILY WITH BREAKFAST  . simvastatin (ZOCOR) 40 MG tablet TAKE 1 TABLET(40 MG) BY MOUTH AT BEDTIME  . tamsulosin (FLOMAX) 0.4 MG CAPS capsule TAKE 1 CAPSULE BY MOUTH EVERY DAY  . B-D UF III MINI PEN NEEDLES 31G X 5 MM MISC USE ONE PER DAY WITH VICTOZA  . Blood Glucose Monitoring Suppl (ACCU-CHEK NANO SMARTVIEW) W/DEVICE KIT   . FLUAD 0.5 ML SUSY   . glucose blood (ACCU-CHEK SMARTVIEW) test strip Test blood sugar twice daily    FAMILY HISTORY:  His indicated that his mother is deceased. He indicated that his father is deceased. He indicated that his brother is deceased.    SOCIAL HISTORY: He  reports that he has never smoked. He has never used smokeless tobacco. He reports that he does not drink alcohol or use drugs.  REVIEW OF SYSTEMS:   Denies dyspnea, cough, wheezing. Denies chest pain, palpitations Denies any nausea, vomiting, positive hematochezia No fevers, chills All other review of systems negative  SUBJECTIVE:    VITAL SIGNS: BP (!) 67/38   Pulse 92   Temp 98.4 F (36.9 C) (Oral)   Resp 20   Ht _0  (1.702 m)   Wt 175 lb 4.3 oz (79.5 kg)   SpO2 97%   BMI 27.45 kg/m   HEMODYNAMICS:    VENTILATOR SETTINGS:    INTAKE / OUTPUT: I/O last 3 completed shifts: In: 6618.3 [  I.V.:1503.3; Blood:1505; IV Piggyback:3610] Out: -   PHYSICAL EXAMINATION: General:  Awake, no distress Neuro:  Intact, No focal deficits HEENT:  No thyromegaly, JVD Cardiovascular:  RRR, No MRG Lungs:  Clear, anterior Abdomen:  Mild tenderness Musculoskeletal:  Intact tone and bukj Skin:  No rash  LABS:  BMET  Recent Labs Lab 05/27/16 1954 05/28/16 0321 05/28/16 0620  NA 139 138  --   K 6.5* 6.5* 5.9*  CL 113* 117*  --   CO2 17* 18*  --   BUN 34* 38*  --   CREATININE 2.08*  2.08*  --   GLUCOSE 292* 177*  --     Electrolytes  Recent Labs Lab 05/27/16 1954 05/28/16 0321  CALCIUM 8.3* 7.5*    CBC  Recent Labs Lab 05/27/16 1954 05/28/16 0620  WBC 10.6* 14.7*  HGB 7.7* 6.6*  HCT 24.0* 19.2*  PLT 202 160    Coag's  Recent Labs Lab 05/27/16 1954  INR 1.16    Sepsis Markers No results for input(s): LATICACIDVEN, PROCALCITON, O2SATVEN in the last 168 hours.  ABG No results for input(s): PHART, PCO2ART, PO2ART in the last 168 hours.  Liver Enzymes  Recent Labs Lab 05/27/16 1954  AST 33  ALT 13*  ALKPHOS 41  BILITOT 1.1  ALBUMIN 2.6*    Cardiac Enzymes No results for input(s): TROPONINI, PROBNP in the last 168 hours.  Glucose  Recent Labs Lab 05/27/16 2254  GLUCAP 263*    Imaging Nm Gi Blood Loss  Result Date: 05/28/2016 CLINICAL DATA:  83 year old male with bloody stools. EXAM: NUCLEAR MEDICINE GASTROINTESTINAL BLEEDING SCAN TECHNIQUE: Sequential abdominal images were obtained following intravenous administration of Tc-34mlabeled red blood cells. RADIOPHARMACEUTICALS:  21.1 mCi Tc-968mn-vitro labeled red cells. COMPARISON:  None. FINDINGS: There is physiologic uptake in the liver spleen and cardiac blood pool as well as uptake within the aorta and IVC. There is radiotracer activity in the right upper abdomen conforming to the location of the hepatic flexure and proximal transverse colon consistent with active GI bleed. IMPRESSION: Active GI bleed in the hepatic flexure/ proximal transverse colon. These results were called by telephone at the time of interpretation on 05/28/2016 at 2:36 am to Dr. JAJennette Kettle who verbally acknowledged these results. Electronically Signed   By: ArAnner Crete.D.   On: 05/28/2016 02:58    STUDIES:    CULTURES:   ANTIBIOTICS:   SIGNIFICANT EVENTS:   LINES/TUBES: Rt IJ 8/20 >>  DISCUSSION: 89ear old with significant lower GI bleed, hemorrhagic shock Concern for diverticular  bleed. Unable to perform embolization by IR GI and surgery consulted for further management.  ASSESSMENT / PLAN:  PULMONARY A: Stable P:   Supplemental O2 Follow CXR  CARDIOVASCULAR A:  Hemorrhagic shock P:  Neo for BP support Check CVP Resuscitation with PRBCs and IV fluids Check LA and troponins  RENAL A:   AKI on CKD, Received contrast load Hyperkalemia P:   Montior urine output and Cr IV fluids Insulin, dextrose, ca for high K Repeat BMP  GASTROINTESTINAL A:   Lower GIB P:   Keep NPO May need colonoscope today  If bleeding continues plan for hemicolectomy  HEMATOLOGIC A:   Blood loss anemia P:  Transfuse for Hb < 7 Follow CBC  INFECTIOUS A:   Leukocytosis, likely from stress P:   Check cultures, Pct  ENDOCRINE A:   DM P:   SSI coverage  NEUROLOGIC A:   No issues P:    FAMILY  -  Updates: Pt, wife and son updated at bedside. - Inter-disciplinary family meet or Palliative Care meeting due by: 3/27  The patient is critically ill with multiple organ system failure and requires high complexity decision making for assessment and support, frequent evaluation and titration of therapies, advanced monitoring, review of radiographic studies and interpretation of complex data.   Critical Care Time devoted to patient care services, exclusive of separately billable procedures, described in this note is 50 minutes.   Marshell Garfinkel MD [facility] Pager [phone] If no answer or after 3pm call: [phone] 05/28/2016, 9:02 AM

## 2016-05-28 NOTE — Consult Note (Signed)
Referring Provider: Dr. Vaughan Browner Primary Care Physician:  No PCP Per Patient Primary Gastroenterologist:  Dr. Sharlett Iles   Reason for Consultation:  GIB  HPI: Zachary Cook is a 83 y.o. male with history of Barrett's, diabetes, diverticulosis, GERD, hiatal hernia, HLD. Last colonoscopy in 08/2009 with Dr. Sharlett Iles showed severe diverticulosis from sigmoid to descending colon as well as moderate right-sided diverticulosis. Admitted on 3/19 with gastrointestinal bleeding. Initial labs noted for hemoglobin 7.7grams. K 6.5, creatinine of 2.  He underwent a tagged RBC scan, which showed active bleeding in the hepatic flexure of the colon. Subsequent evaluation by interventional radiology did not show any active bleeding on angio so no interventions performed.  His bleeding slowed/stopped overnight, but then he re-bled this AM, filling two bed pans with blood.  was transferred to ICU for hypotension BP systolic in the 27P.  Most recent Hgb 6.6 grams.  He is receiving his 2nd of 3 units of blood currently.  Is also going to receive a unit of platelets.  Is on pressors (neo) for hypotension.  Last bloody stool was 1.5 hours ago.   Past Medical History:  Diagnosis Date  . Barrett's esophagus   . Diabetes mellitus without complication ([place])   . Diverticulosis   . Duodenitis   . GERD (gastroesophageal reflux disease)   . Hiatal hernia     Past Surgical History:  Procedure Laterality Date  . IR GENERIC HISTORICAL  05/28/2016   IR ANGIOGRAM SELECTIVE EACH ADDITIONAL VESSEL 05/28/2016 Aletta Edouard, MD [facility]  . IR GENERIC HISTORICAL  05/28/2016   IR ANGIOGRAM VISCERAL SELECTIVE 05/28/2016 Aletta Edouard, MD [facility]  . IR GENERIC HISTORICAL  05/28/2016   IR US GUIDE VASC ACCESS RIGHT 05/28/2016 Aletta Edouard, MD [facility]  . VASECTOMY      Prior to Admission medications   Medication Sig Start Date End Date Taking? Authorizing Provider  aspirin 81 MG tablet Take 81 mg by mouth  daily.   Yes Historical Provider, MD  esomeprazole (NEXIUM) 40 MG capsule TAKE ONE CAPSULE BY MOUTH DAILY AT NOON 04/14/16  Yes [name], MD  glimepiride (AMARYL) 2 MG tablet TAKE 2 TABLETS(4 MG) BY MOUTH DAILY BEFORE DINNER Patient taking differently: take 63m by mouth with dinner 04/05/16  Yes AElayne Snare MD  Insulin Glargine (TOUJEO SOLOSTAR) 300 UNIT/ML SOPN Inject 12 Units into the skin daily. 04/10/16  Yes AElayne Snare MD  levothyroxine (SYNTHROID, LEVOTHROID) 125 MCG tablet Take 1 tablet (125 mcg total) by mouth daily before breakfast. 05/07/16  Yes AElayne Snare MD  liraglutide (VICTOZA) 18 MG/3ML SOPN ADMINISTER 1.2 MG UNDER THE SKIN DAILY 05/07/16  Yes AElayne Snare MD  metFORMIN (GLUCOPHAGE-XR) 750 MG 24 hr tablet TAKE 2 TABLETS(1500 MG) BY MOUTH DAILY WITH BREAKFAST 05/23/16  Yes AElayne Snare MD  naproxen sodium (ANAPROX) 220 MG tablet Take 440 mg by mouth 2 (two) times daily with a meal.   Yes Historical Provider, MD  simvastatin (ZOCOR) 40 MG tablet TAKE 1 TABLET(40 MG) BY MOUTH AT BEDTIME 02/04/16  Yes Chelle Jeffery, PA-C  tamsulosin (FLOMAX) 0.4 MG CAPS capsule TAKE 1 CAPSULE BY MOUTH EVERY DAY 03/24/16  Yes JWendie Agreste MD  B-D UF III MINI PEN NEEDLES 31G X 5 MM MISC USE ONE PER DAY WITH VICTOZA 11/22/15   AElayne Snare MD  Blood Glucose Monitoring Suppl (ACCU-CHEK NANO SMARTVIEW) W/DEVICE KIT  12/02/13   Historical Provider, MD  FLUAD 0.5 ML SUSY  11/30/15   Historical Provider, MD  glucose blood (ACCU-CHEK SMARTVIEW) test strip Test blood sugar twice daily 04/10/16   Elayne Snare, MD    Current Facility-Administered Medications  Medication Dose Route Frequency Provider Last Rate Last Dose  . 0.9 %  sodium chloride infusion   Intravenous Once Etta Quill, DO      . 0.9 %  sodium chloride infusion   Intravenous Once Etta Quill, DO      . 0.9 %  sodium chloride infusion   Intravenous Once Etta Quill, DO      . calcium gluconate 1 g in sodium chloride 0.9 % 100 mL IVPB  1 g  Intravenous Once Praveen Mannam, MD      . dextrose 50 % solution 50 mL  50 mL Intravenous Once Praveen Mannam, MD      . fentaNYL (SUBLIMAZE) injection 25-50 mcg  25-50 mcg Intravenous Q1H PRN Etta Quill, DO      . insulin aspart (novoLOG) injection 0-9 Units  0-9 Units Subcutaneous Q4H Etta Quill, DO   5 Units at 05/27/16 2308  . insulin aspart (novoLOG) injection 10 Units  10 Units Subcutaneous Once Jared M Gardner, DO      . insulin aspart (novoLOG) injection 10 Units  10 Units Intravenous Once Praveen Mannam, MD      . iopamidol (ISOVUE-300) 61 % injection           . iopamidol (ISOVUE-300) 61 % injection    PRN Aletta Edouard, MD   78 mL at 05/28/16 0538  . levothyroxine (SYNTHROID, LEVOTHROID) tablet 125 mcg  125 mcg Oral QAC breakfast Etta Quill, DO      . lidocaine (XYLOCAINE) 1 % (with pres) injection           . pantoprazole (PROTONIX) EC tablet 80 mg  80 mg Oral Q1200 Etta Quill, DO      . phenylephrine (NEO-SYNEPHRINE) 10 mg in sodium chloride 0.9 % 250 mL (0.04 mg/mL) infusion  0-400 mcg/min Intravenous Titrated Etta Quill, DO 75 mL/hr at 05/28/16 0846 50 mcg/min at 05/28/16 0846  . simvastatin (ZOCOR) tablet 40 mg  40 mg Oral q1800 Etta Quill, DO      . sodium bicarbonate 150 mEq in dextrose 5 % 1,000 mL infusion   Intravenous Continuous Etta Quill, DO 50 mL/hr at 05/28/16 0881    . sodium chloride 0.9 % with calcium gluconate ADS Med             Allergies as of 05/27/2016  . (No Known Allergies)    Family History  Problem Relation Age of Onset  . Heart disease Mother   . Diabetes Mother   . Cancer Father     Social History   Social History  . Marital status: Married    Spouse name: N/A  . Number of children: N/A  . Years of education: N/A   Occupational History  . Not on file.   Social History Main Topics  . Smoking status: Never Smoker  . Smokeless tobacco: Never Used  . Alcohol use No  . Drug use: No  . Sexual activity:  Not on file   Other Topics Concern  . Not on file   Social History Narrative  . No narrative on file    Review of Systems: ROS is O/W negative except as mentioned in HPI.  Physical Exam: Vital signs in last 24 hours: Temp:  [97.3 F (36.3 C)-98.7 F (37.1 C)] 97.9 F (36.6  C) (03/20 0745) Pulse Rate:  [67-98] 91 (03/20 0745) Resp:  [0-28] 25 (03/20 0757) BP: (43-144)/(21-77) 76/52 (03/20 0745) SpO2:  [93 %-100 %] 100 % (03/20 0757) Weight:  [168 lb (76.2 kg)-175 lb 4.3 oz (79.5 kg)] 175 lb 4.3 oz (79.5 kg) (03/20 0300) Last BM Date: 05/28/16 General:  Alert, Well-developed, well-nourished, pleasant and cooperative in NAD Head:  Normocephalic and atraumatic. Eyes:  Sclera clear, no icterus.  Conjunctiva pink. Ears:  Normal auditory acuity. Mouth:  No deformity or lesions.   Lungs:  Clear throughout to auscultation.  No wheezes, crackles, or rhonchi.  No increased WOB. Heart:  Tachy.  No murmur noted. Abdomen:  Soft, non-distended.  BS present.  Non-tender. Rectal:  Deferred  Msk:  Symmetrical without gross deformities. Pulses:  Normal pulses noted. Extremities:  Without clubbing or edema. Neurologic:  Alert and  oriented x 4;  grossly normal neurologically. Skin:  Intact without significant lesions or rashes. Psych:  Alert and cooperative. Normal mood and affect.  Intake/Output from previous day: 03/19 0701 - 03/20 0700 In: 6618.3 [I.V.:1503.3; Blood:1505; IV Piggyback:3610] Out: -  Intake/Output this shift: Total I/O In: 41.2 [I.V.:41.2] Out: -   Lab Results:  Recent Labs  05/27/16 1954 05/28/16 0620  WBC 10.6* 14.7*  HGB 7.7* 6.6*  HCT 24.0* 19.2*  PLT 202 160   BMET  Recent Labs  05/27/16 1954 05/28/16 0321 05/28/16 0620  NA 139 138  --   K 6.5* 6.5* 5.9*  CL 113* 117*  --   CO2 17* 18*  --   GLUCOSE 292* 177*  --   BUN 34* 38*  --   CREATININE 2.08* 2.08*  --   CALCIUM 8.3* 7.5*  --    LFT  Recent Labs  05/27/16 1954  PROT 5.6*    ALBUMIN 2.6*  AST 33  ALT 13*  ALKPHOS 41  BILITOT 1.1   PT/INR  Recent Labs  05/27/16 1954  LABPROT 14.9  INR 1.16   Studies/Results: Nm Gi Blood Loss  Result Date: 05/28/2016 CLINICAL DATA:  83 year old male with bloody stools. EXAM: NUCLEAR MEDICINE GASTROINTESTINAL BLEEDING SCAN TECHNIQUE: Sequential abdominal images were obtained following intravenous administration of Tc-68mlabeled red blood cells. RADIOPHARMACEUTICALS:  21.1 mCi Tc-970mn-vitro labeled red cells. COMPARISON:  None. FINDINGS: There is physiologic uptake in the liver spleen and cardiac blood pool as well as uptake within the aorta and IVC. There is radiotracer activity in the right upper abdomen conforming to the location of the hepatic flexure and proximal transverse colon consistent with active GI bleed. IMPRESSION: Active GI bleed in the hepatic flexure/ proximal transverse colon. These results were called by telephone at the time of interpretation on 05/28/2016 at 2:36 am to Dr. JAJennette Kettle who verbally acknowledged these results. Electronically Signed   By: ArAnner Crete.D.   On: 05/28/2016 02:58   Ir Angiogram Visceral Selective  Result Date: 05/28/2016 INDICATION: Acute lower gastrointestinal tract bleeding with positive nuclear medicine bleeding scan localizing to the region of the hepatic flexure/ proximal transverse colon. EXAM: 1. ULTRASOUND GUIDANCE FOR VASCULAR ACCESS OF THE RIGHT COMMON FEMORAL ARTERY 2. SELECTIVE VISCERAL ARTERIOGRAPHY OF THE SUPERIOR MESENTERIC ARTERY 3. ADDITIONAL SELECTIVE ARTERIOGRAPHY OF THE SUPERIOR MESENTERIC ARTERY MEDICATIONS: None ANESTHESIA/SEDATION: Moderate (conscious) sedation was employed during this procedure. A total of Versed 1.0 mg and Fentanyl 25 mcg was administered intravenously. Moderate Sedation Time: 40 minutes. The patient's level of consciousness and vital signs were monitored continuously by radiology nursing throughout the  procedure under my direct  supervision. CONTRAST:  78 mL Isovue-300 FLUOROSCOPY TIME:  Fluoroscopy Time: 7 minutes and 30 seconds. 595 mGy. COMPLICATIONS: None immediate. PROCEDURE: Informed consent was obtained from the patient following explanation of the procedure, risks, benefits and alternatives. The patient understands, agrees and consents for the procedure. All questions were addressed. A time out was performed prior to the initiation of the procedure. Maximal barrier sterile technique utilized including caps, mask, sterile gowns, sterile gloves, large sterile drape, hand hygiene, and Betadine prep. Ultrasound was used to confirm patency of the right common femoral artery. Under ultrasound guidance, access of the artery was performed with a 21 gauge needle and micropuncture set. Ultrasound image documentation was performed. A 5 French vascular sheath was placed. A 5 French Cobra catheter was then advanced into the abdominal aorta an used to selectively catheterize the superior mesenteric artery. Selective arteriography was performed. The catheter was then further advanced passed a pancreaticoduodenal trunk and additional selective arteriography performed in 2 different projections. The Cobra catheter and additional Sos catheter were used to attempt catheterization of the inferior mesenteric artery. Catheters were then removed. Oblique contrast injection was performed at the level of the femoral arteriotomy. Arteriotomy closure was performed with the Cordis Exoseal device. FINDINGS: SMA arteriography initially suggested potentially focal contrast accumulation along the medial aspect of the cecum in a rounded configuration. This was investigated under real-time fluoroscopy and did not show definitive contrast accumulation and was felt to relate to bowel gas. There also was no transit of density into the lumen of the adjacent cecum. No vascular malformations are identified. The IMA origin could not be located or selectively catheterized.  IMPRESSION: No definitive active arterial bleeding identified in the SMA distribution. Focal area of density along the medial aspect of the cecum during SMA arteriography did not show persistence with fluoroscopy or transit in the bowel lumen and was felt to most likely relate to bowel gas rather than contrast extravasation. Electronically Signed   By: Aletta Edouard M.D.   On: 05/28/2016 08:35   Ir Angiogram Selective Each Additional Vessel  Result Date: 05/28/2016 INDICATION: Acute lower gastrointestinal tract bleeding with positive nuclear medicine bleeding scan localizing to the region of the hepatic flexure/ proximal transverse colon. EXAM: 1. ULTRASOUND GUIDANCE FOR VASCULAR ACCESS OF THE RIGHT COMMON FEMORAL ARTERY 2. SELECTIVE VISCERAL ARTERIOGRAPHY OF THE SUPERIOR MESENTERIC ARTERY 3. ADDITIONAL SELECTIVE ARTERIOGRAPHY OF THE SUPERIOR MESENTERIC ARTERY MEDICATIONS: None ANESTHESIA/SEDATION: Moderate (conscious) sedation was employed during this procedure. A total of Versed 1.0 mg and Fentanyl 25 mcg was administered intravenously. Moderate Sedation Time: 40 minutes. The patient's level of consciousness and vital signs were monitored continuously by radiology nursing throughout the procedure under my direct supervision. CONTRAST:  78 mL Isovue-300 FLUOROSCOPY TIME:  Fluoroscopy Time: 7 minutes and 30 seconds. 595 mGy. COMPLICATIONS: None immediate. PROCEDURE: Informed consent was obtained from the patient following explanation of the procedure, risks, benefits and alternatives. The patient understands, agrees and consents for the procedure. All questions were addressed. A time out was performed prior to the initiation of the procedure. Maximal barrier sterile technique utilized including caps, mask, sterile gowns, sterile gloves, large sterile drape, hand hygiene, and Betadine prep. Ultrasound was used to confirm patency of the right common femoral artery. Under ultrasound guidance, access of the artery  was performed with a 21 gauge needle and micropuncture set. Ultrasound image documentation was performed. A 5 French vascular sheath was placed. A 5 French Cobra catheter was then advanced into  the abdominal aorta an used to selectively catheterize the superior mesenteric artery. Selective arteriography was performed. The catheter was then further advanced passed a pancreaticoduodenal trunk and additional selective arteriography performed in 2 different projections. The Cobra catheter and additional Sos catheter were used to attempt catheterization of the inferior mesenteric artery. Catheters were then removed. Oblique contrast injection was performed at the level of the femoral arteriotomy. Arteriotomy closure was performed with the Cordis Exoseal device. FINDINGS: SMA arteriography initially suggested potentially focal contrast accumulation along the medial aspect of the cecum in a rounded configuration. This was investigated under real-time fluoroscopy and did not show definitive contrast accumulation and was felt to relate to bowel gas. There also was no transit of density into the lumen of the adjacent cecum. No vascular malformations are identified. The IMA origin could not be located or selectively catheterized. IMPRESSION: No definitive active arterial bleeding identified in the SMA distribution. Focal area of density along the medial aspect of the cecum during SMA arteriography did not show persistence with fluoroscopy or transit in the bowel lumen and was felt to most likely relate to bowel gas rather than contrast extravasation. Electronically Signed   By: Aletta Edouard M.D.   On: 05/28/2016 08:35   Ir US Guide Vasc Access Right  Result Date: 05/28/2016 INDICATION: Acute lower gastrointestinal tract bleeding with positive nuclear medicine bleeding scan localizing to the region of the hepatic flexure/ proximal transverse colon. EXAM: 1. ULTRASOUND GUIDANCE FOR VASCULAR ACCESS OF THE RIGHT COMMON  FEMORAL ARTERY 2. SELECTIVE VISCERAL ARTERIOGRAPHY OF THE SUPERIOR MESENTERIC ARTERY 3. ADDITIONAL SELECTIVE ARTERIOGRAPHY OF THE SUPERIOR MESENTERIC ARTERY MEDICATIONS: None ANESTHESIA/SEDATION: Moderate (conscious) sedation was employed during this procedure. A total of Versed 1.0 mg and Fentanyl 25 mcg was administered intravenously. Moderate Sedation Time: 40 minutes. The patient's level of consciousness and vital signs were monitored continuously by radiology nursing throughout the procedure under my direct supervision. CONTRAST:  78 mL Isovue-300 FLUOROSCOPY TIME:  Fluoroscopy Time: 7 minutes and 30 seconds. 595 mGy. COMPLICATIONS: None immediate. PROCEDURE: Informed consent was obtained from the patient following explanation of the procedure, risks, benefits and alternatives. The patient understands, agrees and consents for the procedure. All questions were addressed. A time out was performed prior to the initiation of the procedure. Maximal barrier sterile technique utilized including caps, mask, sterile gowns, sterile gloves, large sterile drape, hand hygiene, and Betadine prep. Ultrasound was used to confirm patency of the right common femoral artery. Under ultrasound guidance, access of the artery was performed with a 21 gauge needle and micropuncture set. Ultrasound image documentation was performed. A 5 French vascular sheath was placed. A 5 French Cobra catheter was then advanced into the abdominal aorta an used to selectively catheterize the superior mesenteric artery. Selective arteriography was performed. The catheter was then further advanced passed a pancreaticoduodenal trunk and additional selective arteriography performed in 2 different projections. The Cobra catheter and additional Sos catheter were used to attempt catheterization of the inferior mesenteric artery. Catheters were then removed. Oblique contrast injection was performed at the level of the femoral arteriotomy. Arteriotomy closure  was performed with the Cordis Exoseal device. FINDINGS: SMA arteriography initially suggested potentially focal contrast accumulation along the medial aspect of the cecum in a rounded configuration. This was investigated under real-time fluoroscopy and did not show definitive contrast accumulation and was felt to relate to bowel gas. There also was no transit of density into the lumen of the adjacent cecum. No vascular malformations are identified. The  IMA origin could not be located or selectively catheterized. IMPRESSION: No definitive active arterial bleeding identified in the SMA distribution. Focal area of density along the medial aspect of the cecum during SMA arteriography did not show persistence with fluoroscopy or transit in the bowel lumen and was felt to most likely relate to bowel gas rather than contrast extravasation. Electronically Signed   By: Aletta Edouard M.D.   On: 05/28/2016 08:35   IMPRESSION:  -Massive LGIB:  Suspect diverticular.  Bleeding scan positive but angio negative so no intervention.  Re-bled this AM.  IR does not want to repeat angio due to dye load and CKD/AKI.  Surgery saw the patient this AM. -ABLA:  Hgb 6.6 grams this AM.  Is receiving second unit PRBC's out of 3.  Is also going to receive a unit of platelets. -Hemorrhagic shock:  Started on Neo for pressor support. -Hyperkalemia:  Potassium 5.9.  PLAN: -Patient will continue to be stabilized by the ICU team. -Will give patient rapid bowel prep this morning and plan for attempt at colonoscopy bedside this afternoon.  Yield is low, but worth an attempt in order to try to avoid surgical intervention.  ZEHR, JESSICA D.  05/28/2016, 8:54 AM  Pager number [phone]  GI ATTENDING  History, laboratories, x-rays, previous colonoscopy report reviewed. Patient personally seen and examined. Agree with comprehensive consultation note as outlined above. 83 year old Patient presents with hemodynamically unstable acute lower  GI bleed with significant acute blood loss anemia. Appropriately in the critical care unit. His massive bleeding is almost certainly secondary to diverticular disease which was significant on the left and right side of his colon. Nuclear medicine scan demonstrates bleeding in the region of the hepatic flexure. IR procedure said to be negative for acute bleeding. As such, We were going to consider fast lavage urgent colonoscopy for ongoing bleeding. However, retrospective reevaluation of the previous IR procedure showed active bleeding for which he subsequently underwent repeat IR procedure with successful coil embolization therapy. He is currently resting in bed comfortably without complaints. Recommend continuing to monitor his blood counts, blood pressure, and stools. Transfuse as needed. Will allow sips of clear liquids for now. We will follow. Thank you  Docia Chuck. Geri Seminole., M.D. [facility] Division of Gastroenterology

## 2016-05-28 NOTE — Procedures (Signed)
Superior mesenteric arteriogram and superselective coil embolization of active bleed No complication No blood loss. See complete dictation in [facility].

## 2016-05-28 NOTE — Procedures (Signed)
Central Venous Catheter Insertion Procedure Note Roney Youtz Sarah [facility] [id number] [birth year]  Procedure: Insertion of Central Venous Catheter Indications: Drug and/or fluid administration  Procedure Details Consent: Risks of procedure as well as the alternatives and risks of each were explained to the (patient/caregiver).  Consent for procedure obtained. Time Out: Verified patient identification, verified procedure, site/side was marked, verified correct patient position, special equipment/implants available, medications/allergies/relevent history reviewed, required imaging and test results available.  Performed  Maximum sterile technique was used including antiseptics, cap, gloves, gown, hand hygiene, mask and sheet. Skin prep: Chlorhexidine; local anesthetic administered A antimicrobial bonded/coated triple lumen catheter was placed in the right internal jugular vein using the Seldinger technique.  Evaluation Blood flow good Complications: No apparent complications Patient did tolerate procedure well. Chest X-ray ordered to verify placement.  CXR: pending.  Kenly Xiao 05/28/2016, 8:51 AM

## 2016-05-28 NOTE — Progress Notes (Signed)
eLink Physician-Brief Progress Note /67 Patient Name: Zachary Cook [facility] DOB: 1933-01-25 MRN: [id number]   Date of Service  05/28/2016  HPI/Events of Note  Multiple issues: 1. Patient desaturates into 70's when asleep c/w OSA and 2. Hypotension - BP = 92/67 s/p diuresis.  eICU Interventions  Will order: 1. Autotitrating CPAP Q HS. 2. Bolus with 0.9 NaCl 500 mL IV over 30 minutes now.      Intervention Category Major Interventions: Hypotension - evaluation and management;Hypoxemia - evaluation and management  Lysle Dingwall 05/28/2016, 11:42 PM

## 2016-05-28 NOTE — Progress Notes (Signed)
[facility] Progress Note Patient Name: Zachary Cook DOB: 1933/01/01 MRN: [id number]   Date of Service  05/28/2016  HPI/Events of Note  Persistent hyperkalemia (5.7)  eICU Interventions  HCO3 gtt infusion adjusted Furosemide X 1     Intervention Category Major Interventions: Electrolyte abnormality - evaluation and management  Wilhelmina Mcardle 05/28/2016, 6:33 PM

## 2016-05-28 NOTE — Progress Notes (Signed)
Referring Physician(s): Sheran Luz  Supervising Physician: Arne Cleveland  Patient Status:  [facility] - In-pt  Chief Complaint:  Gastrointestinal bleeding  Subjective:  Pt seen earlier this morning for GI bleed, underwent angio but no visible extravasation seen initially; pt continues to have bleeding and had 2 large bloody BM's this am; he is receiving blood products and is on neo- synephrine to maintain adequate BP, latest 112/91. Latest hgb 6.6, WBC 14.7, creat 2.08, K 5.9. On further review of mesenteric angiogram from earlier today there is evidence of active arterial extravasation in the mid R abdomen probably diverticular, corresponding to site seen on tagged RBC study.  Given the patients clinical status, we can proceed with urgert subselective arterial embolization now.  Allergies: Patient has no known allergies.  Medications: Prior to Admission medications   Medication Sig Start Date End Date Taking? Authorizing Provider  aspirin 81 MG tablet Take 81 mg by mouth daily.   Yes Historical Provider, MD  esomeprazole (NEXIUM) 40 MG capsule TAKE ONE CAPSULE BY MOUTH DAILY AT NOON 04/14/16  Yes [name], MD  glimepiride (AMARYL) 2 MG tablet TAKE 2 TABLETS(4 MG) BY MOUTH DAILY BEFORE DINNER Patient taking differently: take '2mg'$  by mouth with dinner 04/05/16  Yes Elayne Snare, MD  Insulin Glargine (TOUJEO SOLOSTAR) 300 UNIT/ML SOPN Inject 12 Units into the skin daily. 04/10/16  Yes Elayne Snare, MD  levothyroxine (SYNTHROID, LEVOTHROID) 125 MCG tablet Take 1 tablet (125 mcg total) by mouth daily before breakfast. 05/07/16  Yes Elayne Snare, MD  liraglutide (VICTOZA) 18 MG/3ML SOPN ADMINISTER 1.2 MG UNDER THE SKIN DAILY 05/07/16  Yes Elayne Snare, MD  metFORMIN (GLUCOPHAGE-XR) 750 MG 24 hr tablet TAKE 2 TABLETS(1500 MG) BY MOUTH DAILY WITH BREAKFAST 05/23/16  Yes Elayne Snare, MD  naproxen sodium (ANAPROX) 220 MG tablet Take 440 mg by mouth 2 (two) times daily with a meal.   Yes Historical  Provider, MD  simvastatin (ZOCOR) 40 MG tablet TAKE 1 TABLET(40 MG) BY MOUTH AT BEDTIME 02/04/16  Yes Chelle Jeffery, PA-C  tamsulosin (FLOMAX) 0.4 MG CAPS capsule TAKE 1 CAPSULE BY MOUTH EVERY DAY 03/24/16  Yes [name], MD  B-D UF III MINI PEN NEEDLES 31G X 5 MM MISC USE ONE PER DAY WITH VICTOZA 11/22/15   Elayne Snare, MD  Blood Glucose Monitoring Suppl (ACCU-CHEK NANO SMARTVIEW) W/DEVICE KIT  12/02/13   Historical Provider, MD  FLUAD 0.5 ML SUSY  11/30/15   Historical Provider, MD  glucose blood (ACCU-CHEK SMARTVIEW) test strip Test blood sugar twice daily 04/10/16   Elayne Snare, MD     Vital Signs: BP (!) 112/91   Pulse 82   Temp 98.8 F (37.1 C) (Oral)   Resp (!) 24   Ht '5\' 7"'$  (1.702 m)   Wt 175 lb 4.3 oz (79.5 kg)   SpO2 98%   BMI 27.45 kg/m   Physical Exam awake,alert; chest- few bibasilar crackles; heart- sl tachy but regular; abd- soft, few BS, NT; rt CFA puncture site soft, clean, dry, NT, no hematoma; no LE edema,+ distal pulses  Imaging: Nm Gi Blood Loss  Result Date: 05/28/2016 CLINICAL DATA:  83 year old male with bloody stools. EXAM: NUCLEAR MEDICINE GASTROINTESTINAL BLEEDING SCAN TECHNIQUE: Sequential abdominal images were obtained following intravenous administration of Tc-68mlabeled red blood cells. RADIOPHARMACEUTICALS:  21.1 mCi Tc-942mn-vitro labeled red cells. COMPARISON:  None. FINDINGS: There is physiologic uptake in the liver spleen and cardiac blood pool as well as uptake within the aorta and  IVC. There is radiotracer activity in the right upper abdomen conforming to the location of the hepatic flexure and proximal transverse colon consistent with active GI bleed. IMPRESSION: Active GI bleed in the hepatic flexure/ proximal transverse colon. These results were called by telephone at the time of interpretation on 05/28/2016 at 2:36 am to Dr. Lyda Perone , who verbally acknowledged these results. Electronically Signed   By: Elgie Collard M.D.   On: 05/28/2016  02:58   Ir Angiogram Visceral Selective  Result Date: 05/28/2016 INDICATION: Acute lower gastrointestinal tract bleeding with positive nuclear medicine bleeding scan localizing to the region of the hepatic flexure/ proximal transverse colon. EXAM: 1. ULTRASOUND GUIDANCE FOR VASCULAR ACCESS OF THE RIGHT COMMON FEMORAL ARTERY 2. SELECTIVE VISCERAL ARTERIOGRAPHY OF THE SUPERIOR MESENTERIC ARTERY 3. ADDITIONAL SELECTIVE ARTERIOGRAPHY OF THE SUPERIOR MESENTERIC ARTERY MEDICATIONS: None ANESTHESIA/SEDATION: Moderate (conscious) sedation was employed during this procedure. A total of Versed 1.0 mg and Fentanyl 25 mcg was administered intravenously. Moderate Sedation Time: 40 minutes. The patient's level of consciousness and vital signs were monitored continuously by radiology nursing throughout the procedure under my direct supervision. CONTRAST:  78 mL Isovue-300 FLUOROSCOPY TIME:  Fluoroscopy Time: 7 minutes and 30 seconds. 595 mGy. COMPLICATIONS: None immediate. PROCEDURE: Informed consent was obtained from the patient following explanation of the procedure, risks, benefits and alternatives. The patient understands, agrees and consents for the procedure. All questions were addressed. A time out was performed prior to the initiation of the procedure. Maximal barrier sterile technique utilized including caps, mask, sterile gowns, sterile gloves, large sterile drape, hand hygiene, and Betadine prep. Ultrasound was used to confirm patency of the right common femoral artery. Under ultrasound guidance, access of the artery was performed with a 21 gauge needle and micropuncture set. Ultrasound image documentation was performed. A 5 French vascular sheath was placed. A 5 French Cobra catheter was then advanced into the abdominal aorta an used to selectively catheterize the superior mesenteric artery. Selective arteriography was performed. The catheter was then further advanced passed a pancreaticoduodenal trunk and  additional selective arteriography performed in 2 different projections. The Cobra catheter and additional Sos catheter were used to attempt catheterization of the inferior mesenteric artery. Catheters were then removed. Oblique contrast injection was performed at the level of the femoral arteriotomy. Arteriotomy closure was performed with the Cordis Exoseal device. FINDINGS: SMA arteriography initially suggested potentially focal contrast accumulation along the medial aspect of the cecum in a rounded configuration. This was investigated under real-time fluoroscopy and did not show definitive contrast accumulation and was felt to relate to bowel gas. There also was no transit of density into the lumen of the adjacent cecum. No vascular malformations are identified. The IMA origin could not be located or selectively catheterized. IMPRESSION: No definitive active arterial bleeding identified in the SMA distribution. Focal area of density along the medial aspect of the cecum during SMA arteriography did not show persistence with fluoroscopy or transit in the bowel lumen and was felt to most likely relate to bowel gas rather than contrast extravasation. Electronically Signed   By: Irish Lack M.D.   On: 05/28/2016 08:35   Ir Angiogram Selective Each Additional Vessel  Result Date: 05/28/2016 INDICATION: Acute lower gastrointestinal tract bleeding with positive nuclear medicine bleeding scan localizing to the region of the hepatic flexure/ proximal transverse colon. EXAM: 1. ULTRASOUND GUIDANCE FOR VASCULAR ACCESS OF THE RIGHT COMMON FEMORAL ARTERY 2. SELECTIVE VISCERAL ARTERIOGRAPHY OF THE SUPERIOR MESENTERIC ARTERY 3. ADDITIONAL SELECTIVE ARTERIOGRAPHY OF  THE SUPERIOR MESENTERIC ARTERY MEDICATIONS: None ANESTHESIA/SEDATION: Moderate (conscious) sedation was employed during this procedure. A total of Versed 1.0 mg and Fentanyl 25 mcg was administered intravenously. Moderate Sedation Time: 40 minutes. The  patient's level of consciousness and vital signs were monitored continuously by radiology nursing throughout the procedure under my direct supervision. CONTRAST:  78 mL Isovue-300 FLUOROSCOPY TIME:  Fluoroscopy Time: 7 minutes and 30 seconds. 595 mGy. COMPLICATIONS: None immediate. PROCEDURE: Informed consent was obtained from the patient following explanation of the procedure, risks, benefits and alternatives. The patient understands, agrees and consents for the procedure. All questions were addressed. A time out was performed prior to the initiation of the procedure. Maximal barrier sterile technique utilized including caps, mask, sterile gowns, sterile gloves, large sterile drape, hand hygiene, and Betadine prep. Ultrasound was used to confirm patency of the right common femoral artery. Under ultrasound guidance, access of the artery was performed with a 21 gauge needle and micropuncture set. Ultrasound image documentation was performed. A 5 French vascular sheath was placed. A 5 French Cobra catheter was then advanced into the abdominal aorta an used to selectively catheterize the superior mesenteric artery. Selective arteriography was performed. The catheter was then further advanced passed a pancreaticoduodenal trunk and additional selective arteriography performed in 2 different projections. The Cobra catheter and additional Sos catheter were used to attempt catheterization of the inferior mesenteric artery. Catheters were then removed. Oblique contrast injection was performed at the level of the femoral arteriotomy. Arteriotomy closure was performed with the Cordis Exoseal device. FINDINGS: SMA arteriography initially suggested potentially focal contrast accumulation along the medial aspect of the cecum in a rounded configuration. This was investigated under real-time fluoroscopy and did not show definitive contrast accumulation and was felt to relate to bowel gas. There also was no transit of density into  the lumen of the adjacent cecum. No vascular malformations are identified. The IMA origin could not be located or selectively catheterized. IMPRESSION: No definitive active arterial bleeding identified in the SMA distribution. Focal area of density along the medial aspect of the cecum during SMA arteriography did not show persistence with fluoroscopy or transit in the bowel lumen and was felt to most likely relate to bowel gas rather than contrast extravasation. Electronically Signed   By: Aletta Edouard M.D.   On: 05/28/2016 08:35   Ir US Guide Vasc Access Right  Result Date: 05/28/2016 INDICATION: Acute lower gastrointestinal tract bleeding with positive nuclear medicine bleeding scan localizing to the region of the hepatic flexure/ proximal transverse colon. EXAM: 1. ULTRASOUND GUIDANCE FOR VASCULAR ACCESS OF THE RIGHT COMMON FEMORAL ARTERY 2. SELECTIVE VISCERAL ARTERIOGRAPHY OF THE SUPERIOR MESENTERIC ARTERY 3. ADDITIONAL SELECTIVE ARTERIOGRAPHY OF THE SUPERIOR MESENTERIC ARTERY MEDICATIONS: None ANESTHESIA/SEDATION: Moderate (conscious) sedation was employed during this procedure. A total of Versed 1.0 mg and Fentanyl 25 mcg was administered intravenously. Moderate Sedation Time: 40 minutes. The patient's level of consciousness and vital signs were monitored continuously by radiology nursing throughout the procedure under my direct supervision. CONTRAST:  78 mL Isovue-300 FLUOROSCOPY TIME:  Fluoroscopy Time: 7 minutes and 30 seconds. 595 mGy. COMPLICATIONS: None immediate. PROCEDURE: Informed consent was obtained from the patient following explanation of the procedure, risks, benefits and alternatives. The patient understands, agrees and consents for the procedure. All questions were addressed. A time out was performed prior to the initiation of the procedure. Maximal barrier sterile technique utilized including caps, mask, sterile gowns, sterile gloves, large sterile drape, hand hygiene, and Betadine  prep. Ultrasound  was used to confirm patency of the right common femoral artery. Under ultrasound guidance, access of the artery was performed with a 21 gauge needle and micropuncture set. Ultrasound image documentation was performed. A 5 French vascular sheath was placed. A 5 French Cobra catheter was then advanced into the abdominal aorta an used to selectively catheterize the superior mesenteric artery. Selective arteriography was performed. The catheter was then further advanced passed a pancreaticoduodenal trunk and additional selective arteriography performed in 2 different projections. The Cobra catheter and additional Sos catheter were used to attempt catheterization of the inferior mesenteric artery. Catheters were then removed. Oblique contrast injection was performed at the level of the femoral arteriotomy. Arteriotomy closure was performed with the Cordis Exoseal device. FINDINGS: SMA arteriography initially suggested potentially focal contrast accumulation along the medial aspect of the cecum in a rounded configuration. This was investigated under real-time fluoroscopy and did not show definitive contrast accumulation and was felt to relate to bowel gas. There also was no transit of density into the lumen of the adjacent cecum. No vascular malformations are identified. The IMA origin could not be located or selectively catheterized. IMPRESSION: No definitive active arterial bleeding identified in the SMA distribution. Focal area of density along the medial aspect of the cecum during SMA arteriography did not show persistence with fluoroscopy or transit in the bowel lumen and was felt to most likely relate to bowel gas rather than contrast extravasation. Electronically Signed   By: Aletta Edouard M.D.   On: 05/28/2016 08:35   Dg Chest Port 1 View  Result Date: 05/28/2016 CLINICAL DATA:  GI bleed.  Line placement. EXAM: PORTABLE CHEST 1 VIEW COMPARISON:  None. FINDINGS: Low lung volumes. Borderline  cardiac enlargement. Thoracic atherosclerosis. RIGHT IJ central venous line tip SVC RA junction. No pneumothorax. Bibasilar scarring without infiltrates or significant atelectasis. No effusion. Osteopenia. IMPRESSION: Chronic changes as described. RIGHT IJ catheter tip SVC RA junction.  No pneumothorax. Electronically Signed   By: Staci Righter M.D.   On: 05/28/2016 09:20    Labs:  CBC:  Recent Labs  05/27/16 1954 05/28/16 0620  WBC 10.6* 14.7*  HGB 7.7* 6.6*  HCT 24.0* 19.2*  PLT 202 160    COAGS:  Recent Labs  05/27/16 1954  INR 1.16    BMP:  Recent Labs  04/05/16 1042 05/03/16 1103 05/27/16 1954 05/28/16 0321 05/28/16 0620  NA 141 137 139 138  --   K 4.8 4.9 6.5* 6.5* 5.9*  CL 105 104 113* 117*  --   CO2 28 27 17* 18*  --   GLUCOSE 217* 154* 292* 177*  --   BUN 34* 21 34* 38*  --   CALCIUM 9.4 9.7 8.3* 7.5*  --   CREATININE 1.48 1.33 2.08* 2.08*  --   GFRNONAA  --   --  28* 28*  --   GFRAA  --   --  33* 33*  --     LIVER FUNCTION TESTS:  Recent Labs  03/08/16 0943 05/27/16 1954  BILITOT 0.8 1.1  AST 13 33  ALT 10 13*  ALKPHOS 59 41  PROT 7.3 5.6*  ALBUMIN 4.2 2.6*    Assessment and Plan: Pt with hx GI bleed, underwent visceral angio earlier this am but no visible extravasation seen initially; pt continues to have bleeding and had 2 large bloody BM's this am; he is receiving blood products and is on neo- synephrine to maintain adequate BP, latest 112/91. Latest hgb 6.6, WBC 14.7,  creat 2.08, K 5.9. On further review of mesenteric angiogram from earlier today by Dr. Vernard Gambles there is evidence of active arterial extravasation in the mid R abdomen probably diverticular, corresponding to site seen on tagged RBC study. Given the patients clinical status, we can proceed with urgert subselective arterial embolization now.Risks and benefits discussed with the patient including, but not limited to bleeding, infection, vascular injury, need for emergent surgery or  contrast induced renal failure.All of the patient's questions were answered, patient is agreeable to proceed.Consent signed and in chart.     Electronically Signed: D. Rowe Robert 05/28/2016, 10:42 AM   I spent a total of 20 minutes at the the patient's bedside AND on the patient's hospital floor or unit, greater than 50% of which was counseling/coordinating care for visceral arteriogram with possible embolization    Patient ID: Zachary Cook, male   DOB: May 11, 1932, 83 y.o.   MRN: [id number]

## 2016-05-28 NOTE — Sedation Documentation (Signed)
NS at 500/hr

## 2016-05-28 NOTE — Progress Notes (Addendum)
BP down to 62I systolic (3 BP reads in a row), 2nd L PRBC transfusion completing, getting another IVF bolus.  After completion of transfusion and on bolus, rapid improvement in BP to 297L systolic.  Tagged RBC scan is positive with bleed in hepatic flexure of colon.  IR coming to attempt embolization.  Risk to kidney with possibility of contrast nephropathy (on top of patients AKI) is identified to patient, and understood; however, at this point given BPs running low at times and resuscitation requirements, feel that potential benefits of stopping the bleed (which is clearly life threatening at this point), outweigh risk to kidney.

## 2016-05-28 NOTE — Consult Note (Signed)
Reason for Consult:  Dizziness with rectal bleeding Referring Physician: Dr. Sheran Luz    Zachary Cook is an 83 y.o. male.  HPI: Pt with Barret's esophagus and hx of diverticulosis developed with light headedness. He went home and  was lying down; he felt the need to go to the [facility] room and enroute to [facility] said his legs gave out, became incontinent of stool and he could not get up.   Found to have BRB from the rectum at this point. Brought to the ED by EMS.  He reports he did have a small bleed about 6 years ago, but not did not require treatment.  Colonoscopy from Dr. Sharlett Iles 03/19/1999 showed pan colonic diverticulitis, and small rectal polyp that was biopsied. Repeat colonoscopy 08/2009, shows severe diverticulosis in the sigmoid to descending colon.Moderated diverticulosis found in the right colon with large mouthed tics, no cancer or polyps seen.     Work up shows he is hypotensive shortly after arrival. Admit K+ 6.5, Creatinine 2.08, glucose 292, H/H 7.7/24, WBC 10.6, Platlets 202K. Bleeding scan shows GI bleed in the hepatic flexure/proximal transverse colon. IR has attempted to embolize and could not find the bleeding. Now he is bleeding again.  WBC is up to 14.7, H/H down to 6.6/19.2, 160 K platelet this AM 0620  BP is down 65/47, he is not responding to fluid bolus and pressors are being started.  GI is going to see and attempt to do a colonoscopy. He has had 2 units of PRBC, and 1 unit of platelets.  He is starting the 3rd of 4 units PRBC to be transfused.  We area also ask to see.      Past Medical History:  Diagnosis Date  . Barrett's esophagus   . Diabetes mellitus without complication ([place])   . Diverticulosis   . Duodenitis   . GERD (gastroesophageal reflux disease)   . Hiatal hernia     Past Surgical History:  Procedure Laterality Date  . VASECTOMY      Family History  Problem Relation Age of Onset  . Heart disease Mother   . Diabetes Mother   . Cancer Father      Social History:  reports that he has never smoked. He has never used smokeless tobacco. He reports that he does not drink alcohol or use drugs.  Allergies: No Known Allergies  Medications:  Prior to Admission:  Prescriptions Prior to Admission  Medication Sig Dispense Refill Last Dose  . aspirin 81 MG tablet Take 81 mg by mouth daily.   05/27/2016 at Unknown time  . esomeprazole (NEXIUM) 40 MG capsule TAKE ONE CAPSULE BY MOUTH DAILY AT NOON 90 capsule 0 05/26/2016 at Unknown time  . glimepiride (AMARYL) 2 MG tablet TAKE 2 TABLETS(4 MG) BY MOUTH DAILY BEFORE DINNER (Patient taking differently: take 23m by mouth with dinner) 60 tablet 0 05/26/2016 at Unknown time  . Insulin Glargine (TOUJEO SOLOSTAR) 300 UNIT/ML SOPN Inject 12 Units into the skin daily. 3 pen 3 05/26/2016 at Unknown time  . levothyroxine (SYNTHROID, LEVOTHROID) 125 MCG tablet Take 1 tablet (125 mcg total) by mouth daily before breakfast. 30 tablet 4 05/27/2016 at Unknown time  . liraglutide (VICTOZA) 18 MG/3ML SOPN ADMINISTER 1.2 MG UNDER THE SKIN DAILY 6 mL 0 05/26/2016 at Unknown time  . metFORMIN (GLUCOPHAGE-XR) 750 MG 24 hr tablet TAKE 2 TABLETS(1500 MG) BY MOUTH DAILY WITH BREAKFAST 60 tablet 0 05/27/2016 at Unknown time  . naproxen sodium (ANAPROX) 220 MG tablet  Take 440 mg by mouth 2 (two) times daily with a meal.   Past Week at Unknown time  . simvastatin (ZOCOR) 40 MG tablet TAKE 1 TABLET(40 MG) BY MOUTH AT BEDTIME 90 tablet 0 05/26/2016 at Unknown time  . tamsulosin (FLOMAX) 0.4 MG CAPS capsule TAKE 1 CAPSULE BY MOUTH EVERY DAY 90 capsule 0 05/26/2016 at Unknown time  . B-D UF III MINI PEN NEEDLES 31G X 5 MM MISC USE ONE PER DAY WITH VICTOZA 100 each 5 Taking  . Blood Glucose Monitoring Suppl (ACCU-CHEK NANO SMARTVIEW) W/DEVICE KIT    Taking  . FLUAD 0.5 ML SUSY    Taking  . glucose blood (ACCU-CHEK SMARTVIEW) test strip Test blood sugar twice daily 100 each 0 Taking   Scheduled: . sodium chloride   Intravenous Once  .  sodium chloride   Intravenous Once  . sodium chloride   Intravenous Once  . dextrose      . fentaNYL      . insulin aspart  0-9 Units Subcutaneous Q4H  . iopamidol      . levothyroxine  125 mcg Oral QAC breakfast  . lidocaine      . midazolam      . pantoprazole  80 mg Oral Q1200  . simvastatin  40 mg Oral q1800  . calcium gluconate 1 GM IV       Continuous: . phenylephrine (NEO-SYNEPHRINE) Adult infusion 50 mcg/min (05/28/16 0729)  .  sodium bicarbonate  infusion 1000 mL 50 mL/hr at 05/28/16 6578   ION:GEXBMWUX (SUBLIMAZE) injection, iopamidol Anti-infectives    None      Results for orders placed or performed during the hospital encounter of 05/27/16 (from the past 48 hour(s))  Comprehensive metabolic panel     Status: Abnormal   Collection Time: 05/27/16  7:54 PM  Result Value Ref Range   Sodium 139 135 - 145 mmol/L   Potassium 6.5 (HH) 3.5 - 5.1 mmol/L    Comment: CRITICAL RESULT CALLED TO, READ BACK BY AND VERIFIED WITH: H SETTLER RN [id number] 05/27/16 A NAVARRO NO VISIBLE HEMOLYSIS    Chloride 113 (H) 101 - 111 mmol/L   CO2 17 (L) 22 - 32 mmol/L   Glucose, Bld 292 (H) 65 - 99 mg/dL   BUN 34 (H) 6 - 20 mg/dL   Creatinine, Ser 2.08 (H) 0.61 - 1.24 mg/dL   Calcium 8.3 (L) 8.9 - 10.3 mg/dL   Total Protein 5.6 (L) 6.5 - 8.1 g/dL   Albumin 2.6 (L) 3.5 - 5.0 g/dL   AST 33 15 - 41 U/L   ALT 13 (L) 17 - 63 U/L   Alkaline Phosphatase 41 38 - 126 U/L   Total Bilirubin 1.1 0.3 - 1.2 mg/dL   GFR calc non Af Amer 28 (L) >60 mL/min   GFR calc Af Amer 33 (L) >60 mL/min    Comment: (NOTE) The eGFR has been calculated using the CKD EPI equation. This calculation has not been validated in all clinical situations. eGFR's persistently <60 mL/min signify possible Chronic Kidney Disease.    Anion gap 9 5 - 15  CBC     Status: Abnormal   Collection Time: 05/27/16  7:54 PM  Result Value Ref Range   WBC 10.6 (H) 4.0 - 10.5 K/uL   RBC 2.74 (L) 4.22 - 5.81 MIL/uL   Hemoglobin 7.7 (L)  13.0 - 17.0 g/dL   HCT 24.0 (L) 39.0 - 52.0 %   MCV 87.6 78.0 - 100.0 fL  MCH 28.1 26.0 - 34.0 pg   MCHC 32.1 30.0 - 36.0 g/dL   RDW 15.5 11.5 - 15.5 %   Platelets 202 150 - 400 K/uL  Type and screen Zachary Cook     Status: None (Preliminary result)   Collection Time: 05/27/16  7:54 PM  Result Value Ref Range   ABO/RH(D) A POS    Antibody Screen NEG    Sample Expiration 05/30/2016    Unit Number [id number]    Blood Component Type RED CELLS,LR    Unit division 00    Status of Unit ISSUED    Transfusion Status OK TO TRANSFUSE    Crossmatch Result Compatible    Unit Number [id number]    Blood Component Type RED CELLS,LR    Unit division 00    Status of Unit ISSUED,FINAL    Transfusion Status OK TO TRANSFUSE    Crossmatch Result Compatible   Protime-INR     Status: None   Collection Time: 05/27/16  7:54 PM  Result Value Ref Range   Prothrombin Time 14.9 11.4 - 15.2 seconds   INR 1.16   ABO/Rh     Status: None   Collection Time: 05/27/16  7:54 PM  Result Value Ref Range   ABO/RH(D) A POS   Prepare RBC     Status: None   Collection Time: 05/27/16  8:54 PM  Result Value Ref Range   Order Confirmation ORDER PROCESSED BY BLOOD BANK   CBG monitoring, ED     Status: Abnormal   Collection Time: 05/27/16 10:54 PM  Result Value Ref Range   Glucose-Capillary 263 (H) 65 - 99 mg/dL  MRSA PCR Screening     Status: None   Collection Time: 05/28/16  2:59 AM  Result Value Ref Range   MRSA by PCR NEGATIVE NEGATIVE    Comment:        The GeneXpert MRSA Assay (FDA approved for NASAL specimens only), is one component of a comprehensive MRSA colonization surveillance program. It is not intended to diagnose MRSA infection nor to guide or monitor treatment for MRSA infections.   Basic metabolic panel     Status: Abnormal   Collection Time: 05/28/16  3:21 AM  Result Value Ref Range   Sodium 138 135 - 145 mmol/L   Potassium 6.5 (HH) 3.5 - 5.1 mmol/L     Comment: NO VISIBLE HEMOLYSIS CRITICAL RESULT CALLED TO, READ BACK BY AND VERIFIED WITHLoletha Grayer [facility] RN 0406 05/28/16 A NAVARRO    Chloride 117 (H) 101 - 111 mmol/L   CO2 18 (L) 22 - 32 mmol/L   Glucose, Bld 177 (H) 65 - 99 mg/dL   BUN 38 (H) 6 - 20 mg/dL   Creatinine, Ser 2.08 (H) 0.61 - 1.24 mg/dL   Calcium 7.5 (L) 8.9 - 10.3 mg/dL   GFR calc non Af Amer 28 (L) >60 mL/min   GFR calc Af Amer 33 (L) >60 mL/min    Comment: (NOTE) The eGFR has been calculated using the CKD EPI equation. This calculation has not been validated in all clinical situations. eGFR's persistently <60 mL/min signify possible Chronic Kidney Disease.    Anion gap 3 (L) 5 - 15  CBC     Status: Abnormal   Collection Time: 05/28/16  6:20 AM  Result Value Ref Range   WBC 14.7 (H) 4.0 - 10.5 K/uL   RBC 2.29 (L) 4.22 - 5.81 MIL/uL   Hemoglobin 6.6 (LL) 13.0 - 17.0 g/dL  Comment: REPEATED TO VERIFY CRITICAL RESULT CALLED TO, READ BACK BY AND VERIFIED WITH: BALENTYNE,E RN 3.20.18 _0  ZANDO,C    HCT 19.2 (L) 39.0 - 52.0 %   MCV 83.8 78.0 - 100.0 fL   MCH 28.8 26.0 - 34.0 pg   MCHC 34.4 30.0 - 36.0 g/dL   RDW 14.5 11.5 - 15.5 %   Platelets 160 150 - 400 K/uL  Potassium     Status: Abnormal   Collection Time: 05/28/16  6:20 AM  Result Value Ref Range   Potassium 5.9 (H) 3.5 - 5.1 mmol/L    Nm Gi Blood Loss  Result Date: 05/28/2016 CLINICAL DATA:  83 year old male with bloody stools. EXAM: NUCLEAR MEDICINE GASTROINTESTINAL BLEEDING SCAN TECHNIQUE: Sequential abdominal images were obtained following intravenous administration of Tc-41mlabeled red blood cells. RADIOPHARMACEUTICALS:  21.1 mCi Tc-941mn-vitro labeled red cells. COMPARISON:  None. FINDINGS: There is physiologic uptake in the liver spleen and cardiac blood pool as well as uptake within the aorta and IVC. There is radiotracer activity in the right upper abdomen conforming to the location of the hepatic flexure and proximal transverse colon consistent  with active GI bleed. IMPRESSION: Active GI bleed in the hepatic flexure/ proximal transverse colon. These results were called by telephone at the time of interpretation on 05/28/2016 at 2:36 am to Dr. JAJennette Kettle who verbally acknowledged these results. Electronically Signed   By: ArAnner Crete.D.   On: 05/28/2016 02:58    Review of Systems  HENT: Positive for hearing loss.   Eyes: Negative.   Respiratory: Negative.   Cardiovascular: Negative.   Gastrointestinal: Positive for blood in stool, heartburn, melena and nausea. Negative for abdominal pain, diarrhea and vomiting.  Genitourinary: Negative.   Musculoskeletal: Negative.   Skin: Negative.   Neurological: Positive for weakness (legs gave out and he fell to floor could not get up with onset of acute bleeding.).  Endo/Heme/Allergies: Negative.   Psychiatric/Behavioral: Negative.    Blood pressure (!) 71/45, pulse 91, temperature 98.3 F (36.8 C), temperature source Oral, resp. rate (!) 28, height _1  (1.702 m), weight 79.5 kg (175 lb 4.3 oz), SpO2 97 %. Physical Exam  Constitutional: He is oriented to person, place, and time. He appears well-developed and well-nourished. No distress.  HENT:  Head: Normocephalic and atraumatic.  Mouth/Throat: No oropharyngeal exudate.  Eyes: Right eye exhibits no discharge. Left eye exhibits no discharge. No scleral icterus.  Neck: Normal range of motion. Neck supple. No JVD present. No tracheal deviation present. No thyromegaly present.  Cardiovascular: Regular rhythm, normal heart sounds and intact distal pulses.   No murmur heard. HR 90's BP in the 70's  Respiratory: Effort normal and breath sounds normal. No respiratory distress. He has no wheezes. He has no rales. He exhibits no tenderness.  GI: Soft. Bowel sounds are normal. He exhibits no distension and no mass. There is no tenderness. There is no rebound and no guarding.  Musculoskeletal: He exhibits no edema or tenderness.   Lymphadenopathy:    He has no cervical adenopathy.  Neurological: He is alert and oriented to person, place, and time. No cranial nerve deficit.  Skin: Skin is warm and dry. No rash noted. He is not diaphoretic. No erythema. There is pallor.  Psychiatric: He has a normal mood and affect. His behavior is normal. Judgment and thought content normal.   . phenylephrine (NEO-SYNEPHRINE) Adult infusion 70 mcg/min (05/28/16 0745)  .  sodium bicarbonate  infusion 1000 mL 50 mL/hr at  05/28/16 8016    Assessment/Plan: Acute bright red blood from the rectum Hypotension Acute on chronic kidney disease Pan diverticulitis on colonoscopy 2001 and 2011. Hx of Barrett's esophagus  Diabetes Type II GERD - Nexium  Plan:  CCM is working to stabilize him now.  GI plans to do a colonoscopy.  Dr. Redmond Pulling has seen him.  We are going to await colonoscopy, and will follow closely with you.  If bleeding continues and cannot be controlled we would anticipate surgery later today.  Most likely a right colectomy.  We will follow closely with you.     Zully Frane 05/28/2016, 7:05 AM

## 2016-05-28 NOTE — Sedation Documentation (Signed)
Gauze/tegaderm bandage applied to R femoral artery puncture site. Groin level 0, 2+RDP,

## 2016-05-28 NOTE — Progress Notes (Signed)
On further review of mesenteric angiogram from earlier today there is evidence of active arterial extravasation in the mid R abdomen probably diverticular, corresponding to site seen on tagged RBC study.  Given the patients clinical status, we can proceed with urgert subselective arterial embolization. Discussed with Drs. Robyn Haber, and [place].

## 2016-05-28 NOTE — Sedation Documentation (Signed)
NS AT 999

## 2016-05-28 NOTE — Sedation Documentation (Signed)
5Fr sheath removed from R femoral artery by Dr. Vernard Gambles. Hemostasis achieved using Exoseal closure device. Groin level 0, 2+RDP.

## 2016-05-28 NOTE — Procedures (Signed)
Interventional Radiology Procedure Note  Procedure:  Visceral arteriography  Complications: None  Estimated Blood Loss: < 10 mL  Findings:  SMA arteriography negative for active bleeding or other vascular abnormalities. IMA origin not located and unable to be catheterized.  Venetia Night. Kathlene Cote, M.D Pager:  [phone]

## 2016-05-28 NOTE — Sedation Documentation (Signed)
250NS bolus started for hypotension.

## 2016-05-29 DIAGNOSIS — K922 Gastrointestinal hemorrhage, unspecified: Secondary | ICD-10-CM

## 2016-05-29 DIAGNOSIS — K5731 Diverticulosis of large intestine without perforation or abscess with bleeding: Secondary | ICD-10-CM

## 2016-05-29 LAB — CBC WITH DIFFERENTIAL/PLATELET
BASOS PCT: 1 %
Basophils Absolute: 0.1 10*3/uL (ref 0.0–0.1)
Eosinophils Absolute: 0.2 10*3/uL (ref 0.0–0.7)
Eosinophils Relative: 2 %
HCT: 22.1 % — ABNORMAL LOW (ref 39.0–52.0)
HEMOGLOBIN: 7.7 g/dL — AB (ref 13.0–17.0)
LYMPHS ABS: 3.1 10*3/uL (ref 0.7–4.0)
Lymphocytes Relative: 33 %
MCH: 29.4 pg (ref 26.0–34.0)
MCHC: 34.8 g/dL (ref 30.0–36.0)
MCV: 84.4 fL (ref 78.0–100.0)
Monocytes Absolute: 0.7 10*3/uL (ref 0.1–1.0)
Monocytes Relative: 7 %
NEUTROS PCT: 57 %
Neutro Abs: 5.2 10*3/uL (ref 1.7–7.7)
Platelets: 148 10*3/uL — ABNORMAL LOW (ref 150–400)
RBC: 2.62 MIL/uL — AB (ref 4.22–5.81)
RDW: 15.5 % (ref 11.5–15.5)
WBC: 9.3 10*3/uL (ref 4.0–10.5)

## 2016-05-29 LAB — TROPONIN I: Troponin I: 0.03 ng/mL (ref <0.03)

## 2016-05-29 LAB — GLUCOSE, CAPILLARY
GLUCOSE-CAPILLARY: 121 mg/dL — AB (ref 65–99)
GLUCOSE-CAPILLARY: 110 mg/dL — AB (ref 65–99)
GLUCOSE-CAPILLARY: 156 mg/dL — AB (ref 65–99)
GLUCOSE-CAPILLARY: 208 mg/dL — AB (ref 65–99)
GLUCOSE-CAPILLARY: 148 mg/dL — AB (ref 65–99)
Glucose-Capillary: 106 mg/dL — ABNORMAL HIGH (ref 65–99)

## 2016-05-29 LAB — BASIC METABOLIC PANEL
Anion gap: 4 — ABNORMAL LOW (ref 5–15)
BUN: 35 mg/dL — ABNORMAL HIGH (ref 6–20)
CHLORIDE: 115 mmol/L — AB (ref 101–111)
CO2: 20 mmol/L — ABNORMAL LOW (ref 22–32)
Calcium: 7.5 mg/dL — ABNORMAL LOW (ref 8.9–10.3)
Creatinine, Ser: 1.72 mg/dL — ABNORMAL HIGH (ref 0.61–1.24)
GFR, EST AFRICAN AMERICAN: 41 mL/min — AB (ref >60)
GFR, EST NON AFRICAN AMERICAN: 36 mL/min — AB (ref >60)
Glucose, Bld: 168 mg/dL — ABNORMAL HIGH (ref 65–99)
POTASSIUM: 5.1 mmol/L (ref 3.5–5.1)
Sodium: 139 mmol/L (ref 135–145)

## 2016-05-29 LAB — BPAM PLATELET PHERESIS
Blood Product Expiration Date: 201803222359
ISSUE DATE / TIME: 201803200934
UNIT TYPE AND RH: 6200

## 2016-05-29 LAB — PREPARE PLATELET PHERESIS: Unit division: 0

## 2016-05-29 LAB — HEMOGLOBIN AND HEMATOCRIT, BLOOD
HEMATOCRIT: 18.9 % — AB (ref 39.0–52.0)
HEMOGLOBIN: 6.6 g/dL — AB (ref 13.0–17.0)

## 2016-05-29 LAB — PROCALCITONIN: Procalcitonin: 0.18 ng/mL

## 2016-05-29 LAB — MAGNESIUM: MAGNESIUM: 1.3 mg/dL — AB (ref 1.7–2.4)

## 2016-05-29 LAB — PREPARE RBC (CROSSMATCH)

## 2016-05-29 LAB — POTASSIUM: Potassium: 5.2 mmol/L — ABNORMAL HIGH (ref 3.5–5.1)

## 2016-05-29 MED ORDER — SODIUM CHLORIDE 0.9 % IV SOLN
INTRAVENOUS | Status: DC
Start: 2016-05-29 — End: 2016-05-30
  Administered 2016-05-29: 08:00:00 via INTRAVENOUS

## 2016-05-29 MED ORDER — SODIUM CHLORIDE 0.9 % IV SOLN
Freq: Once | INTRAVENOUS | Status: AC
  Administered 2016-05-29: 04:00:00 via INTRAVENOUS

## 2016-05-29 MED ORDER — HYDRALAZINE HCL 20 MG/ML IJ SOLN
5.0000 mg | INTRAMUSCULAR | Status: DC | PRN
  Administered 2016-05-30: 5 mg via INTRAVENOUS
  Filled 2016-05-29: qty 1

## 2016-05-29 NOTE — Progress Notes (Signed)
Subjective: Feels better. No n/v. Had foley removed due to discomfort. Voided on own. Underwent coil embolization of distal branch of SMA in prox colon yesterday.   Objective: Vital signs in last 24 hours: Temp:  [97.4 F (36.3 C)-99.2 F (37.3 C)] 97.9 F (36.6 C) (03/21 0630) Pulse Rate:  [64-92] 72 (03/21 0630) Resp:  [5-26] 21 (03/21 0630) BP: (80-175)/(52-100) 174/58 (03/21 0630) SpO2:  [87 %-100 %] 95 % (03/21 0630) Last BM Date: 05/28/16  Intake/Output from previous day: 03/20 0701 - 03/21 0700 In: 3024.4 [I.V.:1276.9; Blood:1247.5; IV Piggyback:500] Out: 1426 [JSEGB:1517] Intake/Output this shift: No intake/output data recorded.  Alert, awake, less pale today Soft, nt, nd  Lab Results:   Recent Labs  05/28/16 0620 05/28/16 1605 05/29/16 0304  WBC 14.7* 10.5  --   HGB 6.6* 7.7* 6.6*  HCT 19.2* 22.2* 18.9*  PLT 160 178  --    BMET  Recent Labs  05/28/16 0321  05/28/16 1605 05/29/16 0304  NA 138  --  139  --   K 6.5*  < > 5.7* 5.2*  CL 117*  --  117*  --   CO2 18*  --  19*  --   GLUCOSE 177*  --  172*  --   BUN 38*  --  37*  --   CREATININE 2.08*  --  1.96*  --   CALCIUM 7.5*  --  7.2*  --   < > = values in this interval not displayed. PT/INR  Recent Labs  05/27/16 1954 05/28/16 1605  LABPROT 14.9 15.9*  INR 1.16 1.26   ABG No results for input(s): PHART, HCO3 in the last 72 hours.  Invalid input(s): PCO2, PO2  Studies/Results: Nm Gi Blood Loss  Result Date: 05/28/2016 CLINICAL DATA:  83 year old male with bloody stools. EXAM: NUCLEAR MEDICINE GASTROINTESTINAL BLEEDING SCAN TECHNIQUE: Sequential abdominal images were obtained following intravenous administration of Tc-82m labeled red blood cells. RADIOPHARMACEUTICALS:  21.1 mCi Tc-77m in-vitro labeled red cells. COMPARISON:  None. FINDINGS: There is physiologic uptake in the liver spleen and cardiac blood pool as well as uptake within the aorta and IVC. There is radiotracer activity in  the right upper abdomen conforming to the location of the hepatic flexure and proximal transverse colon consistent with active GI bleed. IMPRESSION: Active GI bleed in the hepatic flexure/ proximal transverse colon. These results were called by telephone at the time of interpretation on 05/28/2016 at 2:36 am to Dr. Jennette Kettle , who verbally acknowledged these results. Electronically Signed   By: Anner Crete M.D.   On: 05/28/2016 02:58   Ir Angiogram Visceral Selective  Result Date: 05/28/2016 CLINICAL DATA:  Lower GI Possible diverticular bleed EXAM: SELECTIVE VISCERAL ARTERIOGRAPHY ADDITIONAL ARTERIOGRAPHY IR ULTRASOUND GUIDANCE VASC ACCESS RIGHT IR EMBO ART  VEN HEMORR LYMPH EXTRAV  INC GUIDE ROADMAPPING ANESTHESIA/SEDATION: Intravenous Fentanyl and Versed were administered as conscious sedation during continuous monitoring of the patient's level of consciousness and physiological / cardiorespiratory status by the radiology RN, with a total moderate sedation time of 29 minutes. MEDICATIONS: Lidocaine 1% subcutaneous CONTRAST:  25 mL Visipaque 320 PROCEDURE: The procedure, risks (including but not limited to bleeding, infection, organ damage ), benefits, and alternatives were explained to the patient. Questions regarding the procedure were encouraged and answered. The patient understands and consents to the procedure. Right femoral region prepped and draped in usual sterile fashion. Maximal barrier sterile technique was utilized including caps, mask, sterile gowns, sterile gloves, sterile drape, hand hygiene and skin antiseptic.  The right common femoral artery was localized under ultrasound. Under real-time ultrasound guidance, the vessel was accessed with a 21-gauge micropuncture needle, exchanged over a 018 guidewire for a transitional dilator, through which a 035 guidewire was advanced. Over this, a 5 [place] vascular sheath was placed, through which a 5 [place] C2 catheter was advanced and used to  selectively catheterize the superior mesenteric artery for selective arteriography . A coaxial renegade microcatheter was then advanced with an angled 018 guidewire in used to selectively catheterize second and third order mesenteric branches of the SMA. Confirmatory arteriography was performed. The catheter was advanced distally into the third order branch supplying the bleeding segment and the branch was embolized with 2 mm coils to cessation of flow, confirmed with contrast injection. The catheters and sheath were removed and hemostasis achieved with the aid of the Exoseal device after confirmatory femoral arteriography. The patient tolerated the procedure well. COMPLICATIONS: None immediate FINDINGS: Intermittent Active arterial extravasation supplied by a third order branch of the superior mesenteric artery, corresponding to region of extravasation seen on previous scintigraphy. Technically successful 2 mm coil embolization of distal mesentery branch supplying the affected segment. IMPRESSION: 1. Positive for active extravasation into the lumen of the proximal colon from a distal branch of the SMA. 2. Superselective 2 mm coil embolization of the supplying arterial branch performed without complication. Electronically Signed   By: Lucrezia Europe M.D.   On: 05/28/2016 13:56   Ir Angiogram Visceral Selective  Result Date: 05/28/2016 INDICATION: Acute lower gastrointestinal tract bleeding with positive nuclear medicine bleeding scan localizing to the region of the hepatic flexure/ proximal transverse colon. EXAM: 1. ULTRASOUND GUIDANCE FOR VASCULAR ACCESS OF THE RIGHT COMMON FEMORAL ARTERY 2. SELECTIVE VISCERAL ARTERIOGRAPHY OF THE SUPERIOR MESENTERIC ARTERY 3. ADDITIONAL SELECTIVE ARTERIOGRAPHY OF THE SUPERIOR MESENTERIC ARTERY MEDICATIONS: None ANESTHESIA/SEDATION: Moderate (conscious) sedation was employed during this procedure. A total of Versed 1.0 mg and Fentanyl 25 mcg was administered intravenously.  Moderate Sedation Time: 40 minutes. The patient's level of consciousness and vital signs were monitored continuously by radiology nursing throughout the procedure under my direct supervision. CONTRAST:  78 mL Isovue-300 FLUOROSCOPY TIME:  Fluoroscopy Time: 7 minutes and 30 seconds. 595 mGy. COMPLICATIONS: None immediate. PROCEDURE: Informed consent was obtained from the patient following explanation of the procedure, risks, benefits and alternatives. The patient understands, agrees and consents for the procedure. All questions were addressed. A time out was performed prior to the initiation of the procedure. Maximal barrier sterile technique utilized including caps, mask, sterile gowns, sterile gloves, large sterile drape, hand hygiene, and Betadine prep. Ultrasound was used to confirm patency of the right common femoral artery. Under ultrasound guidance, access of the artery was performed with a 21 gauge needle and micropuncture set. Ultrasound image documentation was performed. A 5 French vascular sheath was placed. A 5 French Cobra catheter was then advanced into the abdominal aorta an used to selectively catheterize the superior mesenteric artery. Selective arteriography was performed. The catheter was then further advanced passed a pancreaticoduodenal trunk and additional selective arteriography performed in 2 different projections. The Cobra catheter and additional Sos catheter were used to attempt catheterization of the inferior mesenteric artery. Catheters were then removed. Oblique contrast injection was performed at the level of the femoral arteriotomy. Arteriotomy closure was performed with the Cordis Exoseal device. FINDINGS: SMA arteriography initially suggested potentially focal contrast accumulation along the medial aspect of the cecum in a rounded configuration. This was investigated under real-time fluoroscopy and did not  show definitive contrast accumulation and was felt to relate to bowel gas.  There also was no transit of density into the lumen of the adjacent cecum. No vascular malformations are identified. The IMA origin could not be located or selectively catheterized. IMPRESSION: No definitive active arterial bleeding identified in the SMA distribution. Focal area of density along the medial aspect of the cecum during SMA arteriography did not show persistence with fluoroscopy or transit in the bowel lumen and was felt to most likely relate to bowel gas rather than contrast extravasation. Electronically Signed   By: Aletta Edouard M.D.   On: 05/28/2016 08:35   Ir Angiogram Selective Each Additional Vessel  Result Date: 05/28/2016 CLINICAL DATA:  Lower GI Possible diverticular bleed EXAM: SELECTIVE VISCERAL ARTERIOGRAPHY ADDITIONAL ARTERIOGRAPHY IR ULTRASOUND GUIDANCE VASC ACCESS RIGHT IR EMBO ART  VEN HEMORR LYMPH EXTRAV  INC GUIDE ROADMAPPING ANESTHESIA/SEDATION: Intravenous Fentanyl and Versed were administered as conscious sedation during continuous monitoring of the patient's level of consciousness and physiological / cardiorespiratory status by the radiology RN, with a total moderate sedation time of 29 minutes. MEDICATIONS: Lidocaine 1% subcutaneous CONTRAST:  25 mL Visipaque 320 PROCEDURE: The procedure, risks (including but not limited to bleeding, infection, organ damage ), benefits, and alternatives were explained to the patient. Questions regarding the procedure were encouraged and answered. The patient understands and consents to the procedure. Right femoral region prepped and draped in usual sterile fashion. Maximal barrier sterile technique was utilized including caps, mask, sterile gowns, sterile gloves, sterile drape, hand hygiene and skin antiseptic. The right common femoral artery was localized under ultrasound. Under real-time ultrasound guidance, the vessel was accessed with a 21-gauge micropuncture needle, exchanged over a 018 guidewire for a transitional dilator, through  which a 035 guidewire was advanced. Over this, a 5 [place] vascular sheath was placed, through which a 5 [place] C2 catheter was advanced and used to selectively catheterize the superior mesenteric artery for selective arteriography . A coaxial renegade microcatheter was then advanced with an angled 018 guidewire in used to selectively catheterize second and third order mesenteric branches of the SMA. Confirmatory arteriography was performed. The catheter was advanced distally into the third order branch supplying the bleeding segment and the branch was embolized with 2 mm coils to cessation of flow, confirmed with contrast injection. The catheters and sheath were removed and hemostasis achieved with the aid of the Exoseal device after confirmatory femoral arteriography. The patient tolerated the procedure well. COMPLICATIONS: None immediate FINDINGS: Intermittent Active arterial extravasation supplied by a third order branch of the superior mesenteric artery, corresponding to region of extravasation seen on previous scintigraphy. Technically successful 2 mm coil embolization of distal mesentery branch supplying the affected segment. IMPRESSION: 1. Positive for active extravasation into the lumen of the proximal colon from a distal branch of the SMA. 2. Superselective 2 mm coil embolization of the supplying arterial branch performed without complication. Electronically Signed   By: Lucrezia Europe M.D.   On: 05/28/2016 13:56   Ir Angiogram Selective Each Additional Vessel  Result Date: 05/28/2016 INDICATION: Acute lower gastrointestinal tract bleeding with positive nuclear medicine bleeding scan localizing to the region of the hepatic flexure/ proximal transverse colon. EXAM: 1. ULTRASOUND GUIDANCE FOR VASCULAR ACCESS OF THE RIGHT COMMON FEMORAL ARTERY 2. SELECTIVE VISCERAL ARTERIOGRAPHY OF THE SUPERIOR MESENTERIC ARTERY 3. ADDITIONAL SELECTIVE ARTERIOGRAPHY OF THE SUPERIOR MESENTERIC ARTERY MEDICATIONS: None  ANESTHESIA/SEDATION: Moderate (conscious) sedation was employed during this procedure. A total of Versed 1.0 mg and Fentanyl 25  mcg was administered intravenously. Moderate Sedation Time: 40 minutes. The patient's level of consciousness and vital signs were monitored continuously by radiology nursing throughout the procedure under my direct supervision. CONTRAST:  78 mL Isovue-300 FLUOROSCOPY TIME:  Fluoroscopy Time: 7 minutes and 30 seconds. 595 mGy. COMPLICATIONS: None immediate. PROCEDURE: Informed consent was obtained from the patient following explanation of the procedure, risks, benefits and alternatives. The patient understands, agrees and consents for the procedure. All questions were addressed. A time out was performed prior to the initiation of the procedure. Maximal barrier sterile technique utilized including caps, mask, sterile gowns, sterile gloves, large sterile drape, hand hygiene, and Betadine prep. Ultrasound was used to confirm patency of the right common femoral artery. Under ultrasound guidance, access of the artery was performed with a 21 gauge needle and micropuncture set. Ultrasound image documentation was performed. A 5 French vascular sheath was placed. A 5 French Cobra catheter was then advanced into the abdominal aorta an used to selectively catheterize the superior mesenteric artery. Selective arteriography was performed. The catheter was then further advanced passed a pancreaticoduodenal trunk and additional selective arteriography performed in 2 different projections. The Cobra catheter and additional Sos catheter were used to attempt catheterization of the inferior mesenteric artery. Catheters were then removed. Oblique contrast injection was performed at the level of the femoral arteriotomy. Arteriotomy closure was performed with the Cordis Exoseal device. FINDINGS: SMA arteriography initially suggested potentially focal contrast accumulation along the medial aspect of the cecum in a  rounded configuration. This was investigated under real-time fluoroscopy and did not show definitive contrast accumulation and was felt to relate to bowel gas. There also was no transit of density into the lumen of the adjacent cecum. No vascular malformations are identified. The IMA origin could not be located or selectively catheterized. IMPRESSION: No definitive active arterial bleeding identified in the SMA distribution. Focal area of density along the medial aspect of the cecum during SMA arteriography did not show persistence with fluoroscopy or transit in the bowel lumen and was felt to most likely relate to bowel gas rather than contrast extravasation. Electronically Signed   By: Aletta Edouard M.D.   On: 05/28/2016 08:35   Ir US Guide Vasc Access Right  Result Date: 05/28/2016 CLINICAL DATA:  Lower GI Possible diverticular bleed EXAM: SELECTIVE VISCERAL ARTERIOGRAPHY ADDITIONAL ARTERIOGRAPHY IR ULTRASOUND GUIDANCE VASC ACCESS RIGHT IR EMBO ART  VEN HEMORR LYMPH EXTRAV  INC GUIDE ROADMAPPING ANESTHESIA/SEDATION: Intravenous Fentanyl and Versed were administered as conscious sedation during continuous monitoring of the patient's level of consciousness and physiological / cardiorespiratory status by the radiology RN, with a total moderate sedation time of 29 minutes. MEDICATIONS: Lidocaine 1% subcutaneous CONTRAST:  25 mL Visipaque 320 PROCEDURE: The procedure, risks (including but not limited to bleeding, infection, organ damage ), benefits, and alternatives were explained to the patient. Questions regarding the procedure were encouraged and answered. The patient understands and consents to the procedure. Right femoral region prepped and draped in usual sterile fashion. Maximal barrier sterile technique was utilized including caps, mask, sterile gowns, sterile gloves, sterile drape, hand hygiene and skin antiseptic. The right common femoral artery was localized under ultrasound. Under real-time  ultrasound guidance, the vessel was accessed with a 21-gauge micropuncture needle, exchanged over a 018 guidewire for a transitional dilator, through which a 035 guidewire was advanced. Over this, a 5 [place] vascular sheath was placed, through which a 5 [place] C2 catheter was advanced and used to selectively catheterize the superior mesenteric artery  for selective arteriography . A coaxial renegade microcatheter was then advanced with an angled 018 guidewire in used to selectively catheterize second and third order mesenteric branches of the SMA. Confirmatory arteriography was performed. The catheter was advanced distally into the third order branch supplying the bleeding segment and the branch was embolized with 2 mm coils to cessation of flow, confirmed with contrast injection. The catheters and sheath were removed and hemostasis achieved with the aid of the Exoseal device after confirmatory femoral arteriography. The patient tolerated the procedure well. COMPLICATIONS: None immediate FINDINGS: Intermittent Active arterial extravasation supplied by a third order branch of the superior mesenteric artery, corresponding to region of extravasation seen on previous scintigraphy. Technically successful 2 mm coil embolization of distal mesentery branch supplying the affected segment. IMPRESSION: 1. Positive for active extravasation into the lumen of the proximal colon from a distal branch of the SMA. 2. Superselective 2 mm coil embolization of the supplying arterial branch performed without complication. Electronically Signed   By: Lucrezia Europe M.D.   On: 05/28/2016 13:56   Ir US Guide Vasc Access Right  Result Date: 05/28/2016 INDICATION: Acute lower gastrointestinal tract bleeding with positive nuclear medicine bleeding scan localizing to the region of the hepatic flexure/ proximal transverse colon. EXAM: 1. ULTRASOUND GUIDANCE FOR VASCULAR ACCESS OF THE RIGHT COMMON FEMORAL ARTERY 2. SELECTIVE VISCERAL ARTERIOGRAPHY  OF THE SUPERIOR MESENTERIC ARTERY 3. ADDITIONAL SELECTIVE ARTERIOGRAPHY OF THE SUPERIOR MESENTERIC ARTERY MEDICATIONS: None ANESTHESIA/SEDATION: Moderate (conscious) sedation was employed during this procedure. A total of Versed 1.0 mg and Fentanyl 25 mcg was administered intravenously. Moderate Sedation Time: 40 minutes. The patient's level of consciousness and vital signs were monitored continuously by radiology nursing throughout the procedure under my direct supervision. CONTRAST:  78 mL Isovue-300 FLUOROSCOPY TIME:  Fluoroscopy Time: 7 minutes and 30 seconds. 595 mGy. COMPLICATIONS: None immediate. PROCEDURE: Informed consent was obtained from the patient following explanation of the procedure, risks, benefits and alternatives. The patient understands, agrees and consents for the procedure. All questions were addressed. A time out was performed prior to the initiation of the procedure. Maximal barrier sterile technique utilized including caps, mask, sterile gowns, sterile gloves, large sterile drape, hand hygiene, and Betadine prep. Ultrasound was used to confirm patency of the right common femoral artery. Under ultrasound guidance, access of the artery was performed with a 21 gauge needle and micropuncture set. Ultrasound image documentation was performed. A 5 French vascular sheath was placed. A 5 French Cobra catheter was then advanced into the abdominal aorta an used to selectively catheterize the superior mesenteric artery. Selective arteriography was performed. The catheter was then further advanced passed a pancreaticoduodenal trunk and additional selective arteriography performed in 2 different projections. The Cobra catheter and additional Sos catheter were used to attempt catheterization of the inferior mesenteric artery. Catheters were then removed. Oblique contrast injection was performed at the level of the femoral arteriotomy. Arteriotomy closure was performed with the Cordis Exoseal device.  FINDINGS: SMA arteriography initially suggested potentially focal contrast accumulation along the medial aspect of the cecum in a rounded configuration. This was investigated under real-time fluoroscopy and did not show definitive contrast accumulation and was felt to relate to bowel gas. There also was no transit of density into the lumen of the adjacent cecum. No vascular malformations are identified. The IMA origin could not be located or selectively catheterized. IMPRESSION: No definitive active arterial bleeding identified in the SMA distribution. Focal area of density along the medial aspect of the cecum  during SMA arteriography did not show persistence with fluoroscopy or transit in the bowel lumen and was felt to most likely relate to bowel gas rather than contrast extravasation. Electronically Signed   By: Aletta Edouard M.D.   On: 05/28/2016 08:35   Dg Chest Port 1 View  Result Date: 05/28/2016 CLINICAL DATA:  GI bleed.  Line placement. EXAM: PORTABLE CHEST 1 VIEW COMPARISON:  None. FINDINGS: Low lung volumes. Borderline cardiac enlargement. Thoracic atherosclerosis. RIGHT IJ central venous line tip SVC RA junction. No pneumothorax. Bibasilar scarring without infiltrates or significant atelectasis. No effusion. Osteopenia. IMPRESSION: Chronic changes as described. RIGHT IJ catheter tip SVC RA junction.  No pneumothorax. Electronically Signed   By: Staci Righter M.D.   On: 05/28/2016 09:20   Ir Embo Art  Lawson Fiscal Hemorr Lymph [employer] Guide Roadmapping  Result Date: 05/28/2016 CLINICAL DATA:  Lower GI Possible diverticular bleed EXAM: SELECTIVE VISCERAL ARTERIOGRAPHY ADDITIONAL ARTERIOGRAPHY IR ULTRASOUND GUIDANCE VASC ACCESS RIGHT IR EMBO ART  VEN HEMORR LYMPH EXTRAV  INC GUIDE ROADMAPPING ANESTHESIA/SEDATION: Intravenous Fentanyl and Versed were administered as conscious sedation during continuous monitoring of the patient's level of consciousness and physiological / cardiorespiratory status by the  radiology RN, with a total moderate sedation time of 29 minutes. MEDICATIONS: Lidocaine 1% subcutaneous CONTRAST:  25 mL Visipaque 320 PROCEDURE: The procedure, risks (including but not limited to bleeding, infection, organ damage ), benefits, and alternatives were explained to the patient. Questions regarding the procedure were encouraged and answered. The patient understands and consents to the procedure. Right femoral region prepped and draped in usual sterile fashion. Maximal barrier sterile technique was utilized including caps, mask, sterile gowns, sterile gloves, sterile drape, hand hygiene and skin antiseptic. The right common femoral artery was localized under ultrasound. Under real-time ultrasound guidance, the vessel was accessed with a 21-gauge micropuncture needle, exchanged over a 018 guidewire for a transitional dilator, through which a 035 guidewire was advanced. Over this, a 5 [place] vascular sheath was placed, through which a 5 [place] C2 catheter was advanced and used to selectively catheterize the superior mesenteric artery for selective arteriography . A coaxial renegade microcatheter was then advanced with an angled 018 guidewire in used to selectively catheterize second and third order mesenteric branches of the SMA. Confirmatory arteriography was performed. The catheter was advanced distally into the third order branch supplying the bleeding segment and the branch was embolized with 2 mm coils to cessation of flow, confirmed with contrast injection. The catheters and sheath were removed and hemostasis achieved with the aid of the Exoseal device after confirmatory femoral arteriography. The patient tolerated the procedure well. COMPLICATIONS: None immediate FINDINGS: Intermittent Active arterial extravasation supplied by a third order branch of the superior mesenteric artery, corresponding to region of extravasation seen on previous scintigraphy. Technically successful 2 mm coil embolization of  distal mesentery branch supplying the affected segment. IMPRESSION: 1. Positive for active extravasation into the lumen of the proximal colon from a distal branch of the SMA. 2. Superselective 2 mm coil embolization of the supplying arterial branch performed without complication. Electronically Signed   By: Lucrezia Europe M.D.   On: 05/28/2016 13:56    Anti-infectives: Anti-infectives    None      Assessment/Plan: BRBPR Acute lower gi bleed Acute on chronic kidney disease dm2 gerd Hyperkalemia Anemia  s/p coil embolization of distal branch of SMA in prox colon 3/20 Don't think he has significant persistent massive bleed. hgb after procedure was 7.7; hgb at 0300  was 6.6. Did get fluid in between; got 1u prbc after 0300 this am  No labs ordered for this am.  Will order BMET, Mag, CBC  Clears/full liquid diet for now until we know he is no longer bleeding   Leighton Ruff. Redmond Pulling, MD, FACS General, Bariatric, & Minimally Invasive Surgery [facility], [place]   LOS: 2 days    Gayland Curry 05/29/2016

## 2016-05-29 NOTE — Progress Notes (Signed)
PULMONARY / CRITICAL CARE MEDICINE   Name: Zachary Cook MRN: [id number] DOB: Sep 28, 1932    ADMISSION DATE:  05/27/2016 CONSULTATION DATE:  05/28/16  REFERRING MD:  Lurline Del MD  CHIEF COMPLAINT:  GIB, hemorrhagic shock  HISTORY OF PRESENT ILLNESS:   83 year old with history of Barrett's, diabetes, diverticulosis, GERD, hiatal hernia. Last colonoscopy in 2011 showed severe diverticulosis from sigmoid to descending colon. Admitted on 3/19 with bloody bowel movements, left lower quadrant pain, one episode of bilious vomiting. Initial labs noted for hemoglobin 7.7. K 6.5, creatinine of 2.  He underwent a tagged RBC scan which showed active bleeding in the hepatic flexure of the colon. Subsequent evaluation by interventional radiology did not show any active bleeding and no interventions performed. He returns to the ICU with hypotension BP systolic in the 62X. He has received 2 units of PRBCs and 3 L of saline so far. He has also received 1 unit of platelets due to history of aspirin use.  PAST MEDICAL HISTORY :  He  has a past medical history of Barrett's esophagus; Diabetes mellitus without complication ([place]); Diverticulosis; Duodenitis; GERD (gastroesophageal reflux disease); and Hiatal hernia.  PAST SURGICAL HISTORY: He  has a past surgical history that includes Vasectomy; ir generic historical (05/28/2016); ir generic historical (05/28/2016); ir generic historical (05/28/2016); ir generic historical (05/28/2016); ir generic historical (05/28/2016); ir generic historical (05/28/2016); and ir generic historical (05/28/2016).  No Known Allergies  No current facility-administered medications on file prior to encounter.    Current Outpatient Prescriptions on File Prior to Encounter  Medication Sig  . aspirin 81 MG tablet Take 81 mg by mouth daily.  Marland Kitchen esomeprazole (NEXIUM) 40 MG capsule TAKE ONE CAPSULE BY MOUTH DAILY AT NOON  . glimepiride (AMARYL) 2 MG tablet TAKE 2 TABLETS(4 MG) BY MOUTH  DAILY BEFORE DINNER (Patient taking differently: take '2mg'$  by mouth with dinner)  . Insulin Glargine (TOUJEO SOLOSTAR) 300 UNIT/ML SOPN Inject 12 Units into the skin daily.  Marland Kitchen levothyroxine (SYNTHROID, LEVOTHROID) 125 MCG tablet Take 1 tablet (125 mcg total) by mouth daily before breakfast.  . liraglutide (VICTOZA) 18 MG/3ML SOPN ADMINISTER 1.2 MG UNDER THE SKIN DAILY  . metFORMIN (GLUCOPHAGE-XR) 750 MG 24 hr tablet TAKE 2 TABLETS(1500 MG) BY MOUTH DAILY WITH BREAKFAST  . simvastatin (ZOCOR) 40 MG tablet TAKE 1 TABLET(40 MG) BY MOUTH AT BEDTIME  . tamsulosin (FLOMAX) 0.4 MG CAPS capsule TAKE 1 CAPSULE BY MOUTH EVERY DAY  . B-D UF III MINI PEN NEEDLES 31G X 5 MM MISC USE ONE PER DAY WITH VICTOZA  . Blood Glucose Monitoring Suppl (ACCU-CHEK NANO SMARTVIEW) W/DEVICE KIT   . FLUAD 0.5 ML SUSY   . glucose blood (ACCU-CHEK SMARTVIEW) test strip Test blood sugar twice daily    FAMILY HISTORY:  His indicated that his mother is deceased. He indicated that his father is deceased. He indicated that his brother is deceased.    SOCIAL HISTORY: He  reports that he has never smoked. He has never used smokeless tobacco. He reports that he does not drink alcohol or use drugs.  REVIEW OF SYSTEMS:   Denies dyspnea, cough, wheezing Denies chest pain, palpitations and denies nausea, vomiting. No more episodes of blood per rectum No fevers, chills All other review of systems are negative.  SUBJECTIVE:  Underwent embolization by IR Received 1 unit of PRBCs for hemoglobin of 6.8 Tolerating PO feeds  VITAL SIGNS: BP (!) 144/56 (BP Location: Left Arm)   Pulse 72   Temp 98.5  F (36.9 C) (Oral)   Resp (!) 21   Ht '5\' 7"'$  (1.702 m)   Wt 175 lb 4.3 oz (79.5 kg)   SpO2 95%   BMI 27.45 kg/m   HEMODYNAMICS:    VENTILATOR SETTINGS:   INTAKE / OUTPUT: I/O last 3 completed shifts: In: 9642.8 [I.V.:2780.3; Blood:2752.5; IV Piggyback:4110] Out: 1426 [WJXBJ:4782]  PHYSICAL EXAMINATION: General:  Awake,  no distress Neuro:  Intact, No focal deficits HEENT:  No thyromegaly, JVD Cardiovascular:  RRR, No MRG Lungs:  Clear, anterior Abdomen:  Mild tenderness Musculoskeletal:  Intact tone and bukj Skin:  No rash  LABS:  BMET  Recent Labs Lab 05/27/16 1954 05/28/16 0321 05/28/16 0620 05/28/16 1605 05/29/16 0304  NA 139 138  --  139  --   K 6.5* 6.5* 5.9* 5.7* 5.2*  CL 113* 117*  --  117*  --   CO2 17* 18*  --  19*  --   BUN 34* 38*  --  37*  --   CREATININE 2.08* 2.08*  --  1.96*  --   GLUCOSE 292* 177*  --  172*  --     Electrolytes  Recent Labs Lab 05/27/16 1954 05/28/16 0321 05/28/16 1605  CALCIUM 8.3* 7.5* 7.2*  MG  --   --  1.3*  PHOS  --   --  3.5    CBC  Recent Labs Lab 05/27/16 1954 05/28/16 0620 05/28/16 1605 05/29/16 0304  WBC 10.6* 14.7* 10.5  --   HGB 7.7* 6.6* 7.7* 6.6*  HCT 24.0* 19.2* 22.2* 18.9*  PLT 202 160 178  --     Coag's  Recent Labs Lab 05/27/16 1954 05/28/16 1605  INR 1.16 1.26    Sepsis Markers  Recent Labs Lab 05/28/16 1605  LATICACIDVEN 0.8  PROCALCITON 0.25    ABG No results for input(s): PHART, PCO2ART, PO2ART in the last 168 hours.  Liver Enzymes  Recent Labs Lab 05/27/16 1954 05/28/16 1605  AST 33 20  ALT 13* 15*  ALKPHOS 41 31*  BILITOT 1.1 0.5  ALBUMIN 2.6* 2.3*    Cardiac Enzymes  Recent Labs Lab 05/28/16 1605  TROPONINI <0.03    Glucose  Recent Labs Lab 05/28/16 1316 05/28/16 1531 05/28/16 1934 05/28/16 2308 05/29/16 0312 05/29/16 0734  GLUCAP 187* 175* 162* 130* 121* 110*    Imaging CXR 05/28/16> no acute lung abnormalities, right IJ in appropriate position I have reviewed the image personally.  STUDIES:    CULTURES:   ANTIBIOTICS:  SIGNIFICANT EVENTS: 3/19 > Admit with LGI bleed 3/20 > Embolization by IR   LINES/TUBES: Rt IJ 8/20 >>  DISCUSSION: 83 year old with significant lower GI bleed, hemorrhagic shock Concern for diverticular bleed. s/p embolization  by IR  ASSESSMENT / PLAN:  PULMONARY A: Stable P:   Supplemental O2 as tolerated  CARDIOVASCULAR A:  Hemorrhagic shock > resolved P:  Off pressors   RENAL A:   AKI on CKD, Received contrast load Hyperkalemia- Improving P:   Montior urine output and Cr Follow up repeat labs  GASTROINTESTINAL A:   Lower GIB P:   Advance diet as tolerated  HEMATOLOGIC A:   Blood loss anemia P:  Transfuse for hemoglobin less than 7 Follow CBC   INFECTIOUS A:   Leukocytosis, likely from stress No evidence for acute infection P:   Observe off antibiotics   ENDOCRINE A:   DM P:   SSI coverage  NEUROLOGIC A:   No issues P:    FAMILY  -  Updates: Patient and wife updated at bedside - Inter-disciplinary family meet or Palliative Care meeting due by: 3/27  PCCM will be available as needed. Please call with questions.  Marshell Garfinkel MD [facility] Pager [phone] If no answer or after 3pm call: [phone] 05/29/2016, 9:37 AM

## 2016-05-29 NOTE — Progress Notes (Signed)
PROGRESS NOTE [facility]   Oluwaseyi Raffel Lannen   NLZ:[id number] DOB: 1932/08/11  DOA: 05/27/2016 PCP: No PCP Per Patient   Brief Narrative:  83 y/o M with PMHx of T2DM, Barrett's and diverticulosis, presented with GI bleed. subsequently developed hypotension, was admitted to ICU placed on pressor and transfused total of 3 units of PRBC's. Patient underwent coil emobolization of distal branch of SMA in prox colon on 05/28/16.   Subjective: Patient seen and examined with wife at bedside. Patient doing well. Have no complaints. Had foley removed early this am. Voided on it own. Tolerated clear liquids this AM.   Assessment & Plan: Hemorrhagic shock due lower GI bleed - likely to be diverticular  s/p coil embolization of distal branch of SMA in prox colon s/p 3 units of PRBC's  Bleeding seem to have stopped, off pressors. BP stable.  GI and Surgery recommendations appreciated  Advance diet as tolerated   Acute on CKD III due to hypoperfusion from blood loss and hypotension - Improving  Continue gentle hydration  Encourage oral intake  Monitor BMP in AM   Anemia of acute blood loss  s/p 3 units PRBC's  Transfuse if Hgb < 7  Monitor CBC in AM   DM type 2 - CBG's stable  SSI  Monitor CBG's   Hypothyroidism - Continue home levothyroxine 125 mcg day   DVT prophylaxis: SCD's  Code Status: FULL  Family Communication: Wife at bedside  Disposition Plan: Home when hemoglobin stable possible 48 hrs   Consultants:   GI  IR   Gen Surgery   Procedures:   Coil embolization   Antimicrobials:  None     Objective: Vitals:   05/29/16 0417 05/29/16 0600 05/29/16 0630 05/29/16 0800  BP: (!) 132/55 (!) 175/54 (!) 174/58 (!) 144/56  Pulse: 69 64 72   Resp: 11 17 (!) 21 (!) 21  Temp: 98.3 F (36.8 C)  97.9 F (36.6 C)   TempSrc: Axillary  Oral   SpO2: 93% 99% 95% 95%  Weight:      Height:        Intake/Output Summary (Last 24 hours) at 05/29/16 0855 Last data  filed at 05/29/16 0800  Gross per 24 hour  Intake          3083.25 ml  Output             1426 ml  Net          1657.25 ml   Filed Weights   05/27/16 1936 05/28/16 0300  Weight: 76.2 kg (168 lb) 79.5 kg (175 lb 4.3 oz)    Examination:  General exam: Appears calm and comfortable  HEENT: Pale conjunctiva  Respiratory system: Clear to auscultation. No wheezes,crackle or rhonchi Cardiovascular system: S1 & S2 heard, RRR. No JVD, murmurs, rubs or gallops Gastrointestinal system: Abdomen is nondistended, soft and nontender.  Central nervous system: Alert and oriented.  Extremities: No pedal edema.  Skin: No rashes, lesions or ulcers Psychiatry: Judgement and insight appear normal. Mood & affect appropriate.    Data Reviewed: I have personally reviewed following labs and imaging studies  CBC:  Recent Labs Lab 05/27/16 1954 05/28/16 0620 05/28/16 1605 05/29/16 0304  WBC 10.6* 14.7* 10.5  --   HGB 7.7* 6.6* 7.7* 6.6*  HCT 24.0* 19.2* 22.2* 18.9*  MCV 87.6 83.8 84.1  --   PLT 202 160 178  --    Basic Metabolic Panel:  Recent Labs Lab 05/27/16 1954 05/28/16 0321 05/28/16 9024  05/28/16 1605 05/29/16 0304  NA 139 138  --  139  --   K 6.5* 6.5* 5.9* 5.7* 5.2*  CL 113* 117*  --  117*  --   CO2 17* 18*  --  19*  --   GLUCOSE 292* 177*  --  172*  --   BUN 34* 38*  --  37*  --   CREATININE 2.08* 2.08*  --  1.96*  --   CALCIUM 8.3* 7.5*  --  7.2*  --   MG  --   --   --  1.3*  --   PHOS  --   --   --  3.5  --    GFR: Estimated Creatinine Clearance: 29.9 mL/min (A) (by C-G formula based on SCr of 1.96 mg/dL (H)). Liver Function Tests:  Recent Labs Lab 05/27/16 1954 05/28/16 1605  AST 33 20  ALT 13* 15*  ALKPHOS 41 31*  BILITOT 1.1 0.5  PROT 5.6* 4.3*  ALBUMIN 2.6* 2.3*   No results for input(s): LIPASE, AMYLASE in the last 168 hours. No results for input(s): AMMONIA in the last 168 hours. Coagulation Profile:  Recent Labs Lab 05/27/16 1954 05/28/16 1605    INR 1.16 1.26   Cardiac Enzymes:  Recent Labs Lab 05/28/16 1605  TROPONINI <0.03   BNP (last 3 results) No results for input(s): PROBNP in the last 8760 hours. HbA1C: No results for input(s): HGBA1C in the last 72 hours. CBG:  Recent Labs Lab 05/28/16 1531 05/28/16 1934 05/28/16 2308 05/29/16 0312 05/29/16 0734  GLUCAP 175* 162* 130* 121* 110*   Lipid Profile: No results for input(s): CHOL, HDL, LDLCALC, TRIG, CHOLHDL, LDLDIRECT in the last 72 hours. Thyroid Function Tests: No results for input(s): TSH, T4TOTAL, FREET4, T3FREE, THYROIDAB in the last 72 hours. Anemia Panel: No results for input(s): VITAMINB12, FOLATE, FERRITIN, TIBC, IRON, RETICCTPCT in the last 72 hours. Sepsis Labs:  Recent Labs Lab 05/28/16 1605  PROCALCITON 0.25  LATICACIDVEN 0.8    Recent Results (from the past 240 hour(s))  MRSA PCR Screening     Status: None   Collection Time: 05/28/16  2:59 AM  Result Value Ref Range Status   MRSA by PCR NEGATIVE NEGATIVE Final    Comment:        The GeneXpert MRSA Assay (FDA approved for NASAL specimens only), is one component of a comprehensive MRSA colonization surveillance program. It is not intended to diagnose MRSA infection nor to guide or monitor treatment for MRSA infections.      Radiology Studies: Nm Gi Blood Loss  Result Date: 05/28/2016 CLINICAL DATA:  83 year old male with bloody stools. EXAM: NUCLEAR MEDICINE GASTROINTESTINAL BLEEDING SCAN TECHNIQUE: Sequential abdominal images were obtained following intravenous administration of Tc-5mlabeled red blood cells. RADIOPHARMACEUTICALS:  21.1 mCi Tc-967mn-vitro labeled red cells. COMPARISON:  None. FINDINGS: There is physiologic uptake in the liver spleen and cardiac blood pool as well as uptake within the aorta and IVC. There is radiotracer activity in the right upper abdomen conforming to the location of the hepatic flexure and proximal transverse colon consistent with active GI  bleed. IMPRESSION: Active GI bleed in the hepatic flexure/ proximal transverse colon. These results were called by telephone at the time of interpretation on 05/28/2016 at 2:36 am to Dr. JAJennette Kettle who verbally acknowledged these results. Electronically Signed   By: ArAnner Crete.D.   On: 05/28/2016 02:58   Ir Angiogram Visceral Selective  Result Date: 05/28/2016 CLINICAL DATA:  Lower  GI Possible diverticular bleed EXAM: SELECTIVE VISCERAL ARTERIOGRAPHY ADDITIONAL ARTERIOGRAPHY IR ULTRASOUND GUIDANCE VASC ACCESS RIGHT IR EMBO ART  VEN HEMORR LYMPH EXTRAV  INC GUIDE ROADMAPPING ANESTHESIA/SEDATION: Intravenous Fentanyl and Versed were administered as conscious sedation during continuous monitoring of the patient's level of consciousness and physiological / cardiorespiratory status by the radiology RN, with a total moderate sedation time of 29 minutes. MEDICATIONS: Lidocaine 1% subcutaneous CONTRAST:  25 mL Visipaque 320 PROCEDURE: The procedure, risks (including but not limited to bleeding, infection, organ damage ), benefits, and alternatives were explained to the patient. Questions regarding the procedure were encouraged and answered. The patient understands and consents to the procedure. Right femoral region prepped and draped in usual sterile fashion. Maximal barrier sterile technique was utilized including caps, mask, sterile gowns, sterile gloves, sterile drape, hand hygiene and skin antiseptic. The right common femoral artery was localized under ultrasound. Under real-time ultrasound guidance, the vessel was accessed with a 21-gauge micropuncture needle, exchanged over a 018 guidewire for a transitional dilator, through which a 035 guidewire was advanced. Over this, a 5 [place] vascular sheath was placed, through which a 5 [place] C2 catheter was advanced and used to selectively catheterize the superior mesenteric artery for selective arteriography . A coaxial renegade microcatheter was then  advanced with an angled 018 guidewire in used to selectively catheterize second and third order mesenteric branches of the SMA. Confirmatory arteriography was performed. The catheter was advanced distally into the third order branch supplying the bleeding segment and the branch was embolized with 2 mm coils to cessation of flow, confirmed with contrast injection. The catheters and sheath were removed and hemostasis achieved with the aid of the Exoseal device after confirmatory femoral arteriography. The patient tolerated the procedure well. COMPLICATIONS: None immediate FINDINGS: Intermittent Active arterial extravasation supplied by a third order branch of the superior mesenteric artery, corresponding to region of extravasation seen on previous scintigraphy. Technically successful 2 mm coil embolization of distal mesentery branch supplying the affected segment. IMPRESSION: 1. Positive for active extravasation into the lumen of the proximal colon from a distal branch of the SMA. 2. Superselective 2 mm coil embolization of the supplying arterial branch performed without complication. Electronically Signed   By: Lucrezia Europe M.D.   On: 05/28/2016 13:56   Ir Angiogram Visceral Selective  Result Date: 05/28/2016 INDICATION: Acute lower gastrointestinal tract bleeding with positive nuclear medicine bleeding scan localizing to the region of the hepatic flexure/ proximal transverse colon. EXAM: 1. ULTRASOUND GUIDANCE FOR VASCULAR ACCESS OF THE RIGHT COMMON FEMORAL ARTERY 2. SELECTIVE VISCERAL ARTERIOGRAPHY OF THE SUPERIOR MESENTERIC ARTERY 3. ADDITIONAL SELECTIVE ARTERIOGRAPHY OF THE SUPERIOR MESENTERIC ARTERY MEDICATIONS: None ANESTHESIA/SEDATION: Moderate (conscious) sedation was employed during this procedure. A total of Versed 1.0 mg and Fentanyl 25 mcg was administered intravenously. Moderate Sedation Time: 40 minutes. The patient's level of consciousness and vital signs were monitored continuously by radiology  nursing throughout the procedure under my direct supervision. CONTRAST:  78 mL Isovue-300 FLUOROSCOPY TIME:  Fluoroscopy Time: 7 minutes and 30 seconds. 595 mGy. COMPLICATIONS: None immediate. PROCEDURE: Informed consent was obtained from the patient following explanation of the procedure, risks, benefits and alternatives. The patient understands, agrees and consents for the procedure. All questions were addressed. A time out was performed prior to the initiation of the procedure. Maximal barrier sterile technique utilized including caps, mask, sterile gowns, sterile gloves, large sterile drape, hand hygiene, and Betadine prep. Ultrasound was used to confirm patency of the right common femoral artery. Under  ultrasound guidance, access of the artery was performed with a 21 gauge needle and micropuncture set. Ultrasound image documentation was performed. A 5 French vascular sheath was placed. A 5 French Cobra catheter was then advanced into the abdominal aorta an used to selectively catheterize the superior mesenteric artery. Selective arteriography was performed. The catheter was then further advanced passed a pancreaticoduodenal trunk and additional selective arteriography performed in 2 different projections. The Cobra catheter and additional Sos catheter were used to attempt catheterization of the inferior mesenteric artery. Catheters were then removed. Oblique contrast injection was performed at the level of the femoral arteriotomy. Arteriotomy closure was performed with the Cordis Exoseal device. FINDINGS: SMA arteriography initially suggested potentially focal contrast accumulation along the medial aspect of the cecum in a rounded configuration. This was investigated under real-time fluoroscopy and did not show definitive contrast accumulation and was felt to relate to bowel gas. There also was no transit of density into the lumen of the adjacent cecum. No vascular malformations are identified. The IMA origin  could not be located or selectively catheterized. IMPRESSION: No definitive active arterial bleeding identified in the SMA distribution. Focal area of density along the medial aspect of the cecum during SMA arteriography did not show persistence with fluoroscopy or transit in the bowel lumen and was felt to most likely relate to bowel gas rather than contrast extravasation. Electronically Signed   By: Aletta Edouard M.D.   On: 05/28/2016 08:35   Ir Angiogram Selective Each Additional Vessel  Result Date: 05/28/2016 CLINICAL DATA:  Lower GI Possible diverticular bleed EXAM: SELECTIVE VISCERAL ARTERIOGRAPHY ADDITIONAL ARTERIOGRAPHY IR ULTRASOUND GUIDANCE VASC ACCESS RIGHT IR EMBO ART  VEN HEMORR LYMPH EXTRAV  INC GUIDE ROADMAPPING ANESTHESIA/SEDATION: Intravenous Fentanyl and Versed were administered as conscious sedation during continuous monitoring of the patient's level of consciousness and physiological / cardiorespiratory status by the radiology RN, with a total moderate sedation time of 29 minutes. MEDICATIONS: Lidocaine 1% subcutaneous CONTRAST:  25 mL Visipaque 320 PROCEDURE: The procedure, risks (including but not limited to bleeding, infection, organ damage ), benefits, and alternatives were explained to the patient. Questions regarding the procedure were encouraged and answered. The patient understands and consents to the procedure. Right femoral region prepped and draped in usual sterile fashion. Maximal barrier sterile technique was utilized including caps, mask, sterile gowns, sterile gloves, sterile drape, hand hygiene and skin antiseptic. The right common femoral artery was localized under ultrasound. Under real-time ultrasound guidance, the vessel was accessed with a 21-gauge micropuncture needle, exchanged over a 018 guidewire for a transitional dilator, through which a 035 guidewire was advanced. Over this, a 5 [place] vascular sheath was placed, through which a 5 [place] C2 catheter was advanced  and used to selectively catheterize the superior mesenteric artery for selective arteriography . A coaxial renegade microcatheter was then advanced with an angled 018 guidewire in used to selectively catheterize second and third order mesenteric branches of the SMA. Confirmatory arteriography was performed. The catheter was advanced distally into the third order branch supplying the bleeding segment and the branch was embolized with 2 mm coils to cessation of flow, confirmed with contrast injection. The catheters and sheath were removed and hemostasis achieved with the aid of the Exoseal device after confirmatory femoral arteriography. The patient tolerated the procedure well. COMPLICATIONS: None immediate FINDINGS: Intermittent Active arterial extravasation supplied by a third order branch of the superior mesenteric artery, corresponding to region of extravasation seen on previous scintigraphy. Technically successful 2 mm coil embolization  of distal mesentery branch supplying the affected segment. IMPRESSION: 1. Positive for active extravasation into the lumen of the proximal colon from a distal branch of the SMA. 2. Superselective 2 mm coil embolization of the supplying arterial branch performed without complication. Electronically Signed   By: Lucrezia Europe M.D.   On: 05/28/2016 13:56   Ir Angiogram Selective Each Additional Vessel  Result Date: 05/28/2016 INDICATION: Acute lower gastrointestinal tract bleeding with positive nuclear medicine bleeding scan localizing to the region of the hepatic flexure/ proximal transverse colon. EXAM: 1. ULTRASOUND GUIDANCE FOR VASCULAR ACCESS OF THE RIGHT COMMON FEMORAL ARTERY 2. SELECTIVE VISCERAL ARTERIOGRAPHY OF THE SUPERIOR MESENTERIC ARTERY 3. ADDITIONAL SELECTIVE ARTERIOGRAPHY OF THE SUPERIOR MESENTERIC ARTERY MEDICATIONS: None ANESTHESIA/SEDATION: Moderate (conscious) sedation was employed during this procedure. A total of Versed 1.0 mg and Fentanyl 25 mcg was  administered intravenously. Moderate Sedation Time: 40 minutes. The patient's level of consciousness and vital signs were monitored continuously by radiology nursing throughout the procedure under my direct supervision. CONTRAST:  78 mL Isovue-300 FLUOROSCOPY TIME:  Fluoroscopy Time: 7 minutes and 30 seconds. 595 mGy. COMPLICATIONS: None immediate. PROCEDURE: Informed consent was obtained from the patient following explanation of the procedure, risks, benefits and alternatives. The patient understands, agrees and consents for the procedure. All questions were addressed. A time out was performed prior to the initiation of the procedure. Maximal barrier sterile technique utilized including caps, mask, sterile gowns, sterile gloves, large sterile drape, hand hygiene, and Betadine prep. Ultrasound was used to confirm patency of the right common femoral artery. Under ultrasound guidance, access of the artery was performed with a 21 gauge needle and micropuncture set. Ultrasound image documentation was performed. A 5 French vascular sheath was placed. A 5 French Cobra catheter was then advanced into the abdominal aorta an used to selectively catheterize the superior mesenteric artery. Selective arteriography was performed. The catheter was then further advanced passed a pancreaticoduodenal trunk and additional selective arteriography performed in 2 different projections. The Cobra catheter and additional Sos catheter were used to attempt catheterization of the inferior mesenteric artery. Catheters were then removed. Oblique contrast injection was performed at the level of the femoral arteriotomy. Arteriotomy closure was performed with the Cordis Exoseal device. FINDINGS: SMA arteriography initially suggested potentially focal contrast accumulation along the medial aspect of the cecum in a rounded configuration. This was investigated under real-time fluoroscopy and did not show definitive contrast accumulation and was felt  to relate to bowel gas. There also was no transit of density into the lumen of the adjacent cecum. No vascular malformations are identified. The IMA origin could not be located or selectively catheterized. IMPRESSION: No definitive active arterial bleeding identified in the SMA distribution. Focal area of density along the medial aspect of the cecum during SMA arteriography did not show persistence with fluoroscopy or transit in the bowel lumen and was felt to most likely relate to bowel gas rather than contrast extravasation. Electronically Signed   By: Aletta Edouard M.D.   On: 05/28/2016 08:35   Ir US Guide Vasc Access Right  Result Date: 05/28/2016 CLINICAL DATA:  Lower GI Possible diverticular bleed EXAM: SELECTIVE VISCERAL ARTERIOGRAPHY ADDITIONAL ARTERIOGRAPHY IR ULTRASOUND GUIDANCE VASC ACCESS RIGHT IR EMBO ART  VEN HEMORR LYMPH EXTRAV  INC GUIDE ROADMAPPING ANESTHESIA/SEDATION: Intravenous Fentanyl and Versed were administered as conscious sedation during continuous monitoring of the patient's level of consciousness and physiological / cardiorespiratory status by the radiology RN, with a total moderate sedation time of 29 minutes. MEDICATIONS:  Lidocaine 1% subcutaneous CONTRAST:  25 mL Visipaque 320 PROCEDURE: The procedure, risks (including but not limited to bleeding, infection, organ damage ), benefits, and alternatives were explained to the patient. Questions regarding the procedure were encouraged and answered. The patient understands and consents to the procedure. Right femoral region prepped and draped in usual sterile fashion. Maximal barrier sterile technique was utilized including caps, mask, sterile gowns, sterile gloves, sterile drape, hand hygiene and skin antiseptic. The right common femoral artery was localized under ultrasound. Under real-time ultrasound guidance, the vessel was accessed with a 21-gauge micropuncture needle, exchanged over a 018 guidewire for a transitional dilator,  through which a 035 guidewire was advanced. Over this, a 5 [place] vascular sheath was placed, through which a 5 [place] C2 catheter was advanced and used to selectively catheterize the superior mesenteric artery for selective arteriography . A coaxial renegade microcatheter was then advanced with an angled 018 guidewire in used to selectively catheterize second and third order mesenteric branches of the SMA. Confirmatory arteriography was performed. The catheter was advanced distally into the third order branch supplying the bleeding segment and the branch was embolized with 2 mm coils to cessation of flow, confirmed with contrast injection. The catheters and sheath were removed and hemostasis achieved with the aid of the Exoseal device after confirmatory femoral arteriography. The patient tolerated the procedure well. COMPLICATIONS: None immediate FINDINGS: Intermittent Active arterial extravasation supplied by a third order branch of the superior mesenteric artery, corresponding to region of extravasation seen on previous scintigraphy. Technically successful 2 mm coil embolization of distal mesentery branch supplying the affected segment. IMPRESSION: 1. Positive for active extravasation into the lumen of the proximal colon from a distal branch of the SMA. 2. Superselective 2 mm coil embolization of the supplying arterial branch performed without complication. Electronically Signed   By: Lucrezia Europe M.D.   On: 05/28/2016 13:56   Ir US Guide Vasc Access Right  Result Date: 05/28/2016 INDICATION: Acute lower gastrointestinal tract bleeding with positive nuclear medicine bleeding scan localizing to the region of the hepatic flexure/ proximal transverse colon. EXAM: 1. ULTRASOUND GUIDANCE FOR VASCULAR ACCESS OF THE RIGHT COMMON FEMORAL ARTERY 2. SELECTIVE VISCERAL ARTERIOGRAPHY OF THE SUPERIOR MESENTERIC ARTERY 3. ADDITIONAL SELECTIVE ARTERIOGRAPHY OF THE SUPERIOR MESENTERIC ARTERY MEDICATIONS: None  ANESTHESIA/SEDATION: Moderate (conscious) sedation was employed during this procedure. A total of Versed 1.0 mg and Fentanyl 25 mcg was administered intravenously. Moderate Sedation Time: 40 minutes. The patient's level of consciousness and vital signs were monitored continuously by radiology nursing throughout the procedure under my direct supervision. CONTRAST:  78 mL Isovue-300 FLUOROSCOPY TIME:  Fluoroscopy Time: 7 minutes and 30 seconds. 595 mGy. COMPLICATIONS: None immediate. PROCEDURE: Informed consent was obtained from the patient following explanation of the procedure, risks, benefits and alternatives. The patient understands, agrees and consents for the procedure. All questions were addressed. A time out was performed prior to the initiation of the procedure. Maximal barrier sterile technique utilized including caps, mask, sterile gowns, sterile gloves, large sterile drape, hand hygiene, and Betadine prep. Ultrasound was used to confirm patency of the right common femoral artery. Under ultrasound guidance, access of the artery was performed with a 21 gauge needle and micropuncture set. Ultrasound image documentation was performed. A 5 French vascular sheath was placed. A 5 French Cobra catheter was then advanced into the abdominal aorta an used to selectively catheterize the superior mesenteric artery. Selective arteriography was performed. The catheter was then further advanced passed a pancreaticoduodenal trunk and  additional selective arteriography performed in 2 different projections. The Cobra catheter and additional Sos catheter were used to attempt catheterization of the inferior mesenteric artery. Catheters were then removed. Oblique contrast injection was performed at the level of the femoral arteriotomy. Arteriotomy closure was performed with the Cordis Exoseal device. FINDINGS: SMA arteriography initially suggested potentially focal contrast accumulation along the medial aspect of the cecum in a  rounded configuration. This was investigated under real-time fluoroscopy and did not show definitive contrast accumulation and was felt to relate to bowel gas. There also was no transit of density into the lumen of the adjacent cecum. No vascular malformations are identified. The IMA origin could not be located or selectively catheterized. IMPRESSION: No definitive active arterial bleeding identified in the SMA distribution. Focal area of density along the medial aspect of the cecum during SMA arteriography did not show persistence with fluoroscopy or transit in the bowel lumen and was felt to most likely relate to bowel gas rather than contrast extravasation. Electronically Signed   By: Aletta Edouard M.D.   On: 05/28/2016 08:35   Dg Chest Port 1 View  Result Date: 05/28/2016 CLINICAL DATA:  GI bleed.  Line placement. EXAM: PORTABLE CHEST 1 VIEW COMPARISON:  None. FINDINGS: Low lung volumes. Borderline cardiac enlargement. Thoracic atherosclerosis. RIGHT IJ central venous line tip SVC RA junction. No pneumothorax. Bibasilar scarring without infiltrates or significant atelectasis. No effusion. Osteopenia. IMPRESSION: Chronic changes as described. RIGHT IJ catheter tip SVC RA junction.  No pneumothorax. Electronically Signed   By: Staci Righter M.D.   On: 05/28/2016 09:20   Ir Embo Art  [facility]son [employer]cal Hemorr Lymph [employer] Guide Roadmapping  Result Date: 05/28/2016 CLINICAL DATA:  Lower GI Possible diverticular bleed EXAM: SELECTIVE VISCERAL ARTERIOGRAPHY ADDITIONAL ARTERIOGRAPHY IR ULTRASOUND GUIDANCE VASC ACCESS RIGHT IR EMBO ART  VEN HEMORR LYMPH EXTRAV  INC GUIDE ROADMAPPING ANESTHESIA/SEDATION: Intravenous Fentanyl and Versed were administered as conscious sedation during continuous monitoring of the patient's level of consciousness and physiological / cardiorespiratory status by the radiology RN, with a total moderate sedation time of 29 minutes. MEDICATIONS: Lidocaine 1% subcutaneous CONTRAST:  25 mL  [place] 320 PROCEDURE: The procedure, risks (including but not limited to bleeding, infection, organ damage ), benefits, and alternatives were explained to the patient. Questions regarding the procedure were encouraged and answered. The patient understands and consents to the procedure. Right femoral region prepped and draped in usual sterile fashion. Maximal barrier sterile technique was utilized including caps, mask, sterile gowns, sterile gloves, sterile drape, hand hygiene and skin antiseptic. The right common femoral artery was localized under ultrasound. Under real-time ultrasound guidance, the vessel was accessed with a 21-gauge micropuncture needle, exchanged over a 018 guidewire for a transitional dilator, through which a 035 guidewire was advanced. Over this, a 5 [place] vascular sheath was placed, through which a 5 [place] C2 catheter was advanced and used to selectively catheterize the superior mesenteric artery for selective arteriography . A coaxial renegade microcatheter was then advanced with an angled 018 guidewire in used to selectively catheterize second and third order mesenteric branches of the SMA. Confirmatory arteriography was performed. The catheter was advanced distally into the third order branch supplying the bleeding segment and the branch was embolized with 2 mm coils to cessation of flow, confirmed with contrast injection. The catheters and sheath were removed and hemostasis achieved with the aid of the Exoseal device after confirmatory femoral arteriography. The patient tolerated the procedure well. COMPLICATIONS: None immediate FINDINGS: Intermittent Active  arterial extravasation supplied by a third order branch of the superior mesenteric artery, corresponding to region of extravasation seen on previous scintigraphy. Technically successful 2 mm coil embolization of distal mesentery branch supplying the affected segment. IMPRESSION: 1. Positive for active extravasation into the lumen  of the proximal colon from a distal branch of the SMA. 2. Superselective 2 mm coil embolization of the supplying arterial branch performed without complication. Electronically Signed   By: Lucrezia Europe M.D.   On: 05/28/2016 13:56    Scheduled Meds: . sodium chloride   Intravenous Once  . chlorhexidine  15 mL Mouth Rinse BID  . insulin aspart  0-9 Units Subcutaneous Q4H  . levothyroxine  125 mcg Oral QAC breakfast  . mouth rinse  15 mL Mouth Rinse q12n4p  . pantoprazole  80 mg Oral Q1200  . peg 3350 powder  1 kit Oral Once  . simvastatin  40 mg Oral q1800   Continuous Infusions: . sodium chloride 50 mL/hr at 05/29/16 0800  . sodium chloride       LOS: 2 days    Chipper Oman, MD Pager: Text Page via [email or web address]  [phone]  If 7PM-7AM, please contact night-coverage [email or web address] Password [facility] 05/29/2016, 8:55 AM

## 2016-05-29 NOTE — Progress Notes (Signed)
CRITICAL VALUE ALERT  Critical value received:  Hgb 6.6  Date of notification:  05/29/16  Time of notification:  0329  Critical value read back:Yes.    Nurse who received alert:  Reche Dixon  MD notified (1st page):  Sommer  Time of first page:  0330  MD notified (2nd page):  Time of second page:  Responding MD:  Oletta Darter  Time MD responded:  [phone]

## 2016-05-29 NOTE — Progress Notes (Signed)
eLink Physician-Brief Progress Note Patient Name: Zachary Cook DOB: 1932/05/20 MRN: [id number]   Date of Service  05/29/2016  HPI/Events of Note  Anemia - Hgb = 6.6.  eICU Interventions  Will transfuse 1 unit PRBC.     Intervention Category Major Interventions: Other:  Lysle Dingwall 05/29/2016, 3:38 AM

## 2016-05-29 NOTE — Progress Notes (Signed)
[facility] Progress Note  CC:  LGIB  Subjective:  Feels good.  No further sign of bleeding.  Had coil embolization of distal branch SMA on 3/20.  Hgb down to 6.6 grams again this AM.  No abdominal pain.  Objective:  Vital signs in last 24 hours: Temp:  [97.4 F (36.3 C)-99.2 F (37.3 C)] 97.9 F (36.6 C) (03/21 0630) Pulse Rate:  [64-92] 72 (03/21 0630) Resp:  [5-26] 21 (03/21 0800) BP: (80-175)/(54-100) 144/56 (03/21 0800) SpO2:  [87 %-100 %] 95 % (03/21 0800) Last BM Date: 05/28/16 General:  Alert, Well-developed, in NAD Heart:  Regular rate and rhythm; no murmurs Pulm:  CTAB.  No increased WOB. Abdomen:  Soft, non-distended.  BS present.  Non-tender. Extremities:  Without edema. Neurologic:  Alert and oriented x 4;  grossly normal neurologically. Psych:  Alert and cooperative. Normal mood and affect.  Intake/Output from previous day: 03/20 0701 - 03/21 0700 In: 3024.4 [I.V.:1276.9; Blood:1247.5; IV Piggyback:500] Out: 1426 [Urine:1426] Intake/Output this shift: Total I/O In: 100 [I.V.:100] Out: -   Lab Results:  Recent Labs  05/27/16 1954 05/28/16 0620 05/28/16 1605 05/29/16 0304  WBC 10.6* 14.7* 10.5  --   HGB 7.7* 6.6* 7.7* 6.6*  HCT 24.0* 19.2* 22.2* 18.9*  PLT 202 160 178  --    BMET  Recent Labs  05/27/16 1954 05/28/16 0321 05/28/16 0620 05/28/16 1605 05/29/16 0304  NA 139 138  --  139  --   K 6.5* 6.5* 5.9* 5.7* 5.2*  CL 113* 117*  --  117*  --   CO2 17* 18*  --  19*  --   GLUCOSE 292* 177*  --  172*  --   BUN 34* 38*  --  37*  --   CREATININE 2.08* 2.08*  --  1.96*  --   CALCIUM 8.3* 7.5*  --  7.2*  --    LFT  Recent Labs  05/28/16 1605  PROT 4.3*  ALBUMIN 2.3*  AST 20  ALT 15*  ALKPHOS 31*  BILITOT 0.5   PT/INR  Recent Labs  05/27/16 1954 05/28/16 1605  LABPROT 14.9 15.9*  INR 1.16 1.26   Nm Gi Blood Loss  Result Date: 05/28/2016 CLINICAL DATA:  83 year old male with bloody stools. EXAM: NUCLEAR  MEDICINE GASTROINTESTINAL BLEEDING SCAN TECHNIQUE: Sequential abdominal images were obtained following intravenous administration of Tc-26m labeled red blood cells. RADIOPHARMACEUTICALS:  21.1 mCi Tc-67m in-vitro labeled red cells. COMPARISON:  None. FINDINGS: There is physiologic uptake in the liver spleen and cardiac blood pool as well as uptake within the aorta and IVC. There is radiotracer activity in the right upper abdomen conforming to the location of the hepatic flexure and proximal transverse colon consistent with active GI bleed. IMPRESSION: Active GI bleed in the hepatic flexure/ proximal transverse colon. These results were called by telephone at the time of interpretation on 05/28/2016 at 2:36 am to Dr. Jennette Kettle , who verbally acknowledged these results. Electronically Signed   By: Anner Crete M.D.   On: 05/28/2016 02:58   Ir Angiogram Visceral Selective  Result Date: 05/28/2016 CLINICAL DATA:  Lower GI Possible diverticular bleed EXAM: SELECTIVE VISCERAL ARTERIOGRAPHY ADDITIONAL ARTERIOGRAPHY IR ULTRASOUND GUIDANCE VASC ACCESS RIGHT IR EMBO ART  VEN HEMORR LYMPH EXTRAV  INC GUIDE ROADMAPPING ANESTHESIA/SEDATION: Intravenous Fentanyl and Versed were administered as conscious sedation during continuous monitoring of the patient's level of consciousness and physiological / cardiorespiratory status by the radiology RN, with a total moderate sedation  time of 29 minutes. MEDICATIONS: Lidocaine 1% subcutaneous CONTRAST:  25 mL Visipaque 320 PROCEDURE: The procedure, risks (including but not limited to bleeding, infection, organ damage ), benefits, and alternatives were explained to the patient. Questions regarding the procedure were encouraged and answered. The patient understands and consents to the procedure. Right femoral region prepped and draped in usual sterile fashion. Maximal barrier sterile technique was utilized including caps, mask, sterile gowns, sterile gloves, sterile drape, hand  hygiene and skin antiseptic. The right common femoral artery was localized under ultrasound. Under real-time ultrasound guidance, the vessel was accessed with a 21-gauge micropuncture needle, exchanged over a 018 guidewire for a transitional dilator, through which a 035 guidewire was advanced. Over this, a 5 [place] vascular sheath was placed, through which a 5 [place] C2 catheter was advanced and used to selectively catheterize the superior mesenteric artery for selective arteriography . A coaxial renegade microcatheter was then advanced with an angled 018 guidewire in used to selectively catheterize second and third order mesenteric branches of the SMA. Confirmatory arteriography was performed. The catheter was advanced distally into the third order branch supplying the bleeding segment and the branch was embolized with 2 mm coils to cessation of flow, confirmed with contrast injection. The catheters and sheath were removed and hemostasis achieved with the aid of the Exoseal device after confirmatory femoral arteriography. The patient tolerated the procedure well. COMPLICATIONS: None immediate FINDINGS: Intermittent Active arterial extravasation supplied by a third order branch of the superior mesenteric artery, corresponding to region of extravasation seen on previous scintigraphy. Technically successful 2 mm coil embolization of distal mesentery branch supplying the affected segment. IMPRESSION: 1. Positive for active extravasation into the lumen of the proximal colon from a distal branch of the SMA. 2. Superselective 2 mm coil embolization of the supplying arterial branch performed without complication. Electronically Signed   By: Lucrezia Europe M.D.   On: 05/28/2016 13:56   Ir Angiogram Visceral Selective  Result Date: 05/28/2016 INDICATION: Acute lower gastrointestinal tract bleeding with positive nuclear medicine bleeding scan localizing to the region of the hepatic flexure/ proximal transverse colon. EXAM: 1.  ULTRASOUND GUIDANCE FOR VASCULAR ACCESS OF THE RIGHT COMMON FEMORAL ARTERY 2. SELECTIVE VISCERAL ARTERIOGRAPHY OF THE SUPERIOR MESENTERIC ARTERY 3. ADDITIONAL SELECTIVE ARTERIOGRAPHY OF THE SUPERIOR MESENTERIC ARTERY MEDICATIONS: None ANESTHESIA/SEDATION: Moderate (conscious) sedation was employed during this procedure. A total of Versed 1.0 mg and Fentanyl 25 mcg was administered intravenously. Moderate Sedation Time: 40 minutes. The patient's level of consciousness and vital signs were monitored continuously by radiology nursing throughout the procedure under my direct supervision. CONTRAST:  78 mL Isovue-300 FLUOROSCOPY TIME:  Fluoroscopy Time: 7 minutes and 30 seconds. 595 mGy. COMPLICATIONS: None immediate. PROCEDURE: Informed consent was obtained from the patient following explanation of the procedure, risks, benefits and alternatives. The patient understands, agrees and consents for the procedure. All questions were addressed. A time out was performed prior to the initiation of the procedure. Maximal barrier sterile technique utilized including caps, mask, sterile gowns, sterile gloves, large sterile drape, hand hygiene, and Betadine prep. Ultrasound was used to confirm patency of the right common femoral artery. Under ultrasound guidance, access of the artery was performed with a 21 gauge needle and micropuncture set. Ultrasound image documentation was performed. A 5 French vascular sheath was placed. A 5 French Cobra catheter was then advanced into the abdominal aorta an used to selectively catheterize the superior mesenteric artery. Selective arteriography was performed. The catheter was then further advanced passed a  pancreaticoduodenal trunk and additional selective arteriography performed in 2 different projections. The Cobra catheter and additional Sos catheter were used to attempt catheterization of the inferior mesenteric artery. Catheters were then removed. Oblique contrast injection was performed  at the level of the femoral arteriotomy. Arteriotomy closure was performed with the Cordis Exoseal device. FINDINGS: SMA arteriography initially suggested potentially focal contrast accumulation along the medial aspect of the cecum in a rounded configuration. This was investigated under real-time fluoroscopy and did not show definitive contrast accumulation and was felt to relate to bowel gas. There also was no transit of density into the lumen of the adjacent cecum. No vascular malformations are identified. The IMA origin could not be located or selectively catheterized. IMPRESSION: No definitive active arterial bleeding identified in the SMA distribution. Focal area of density along the medial aspect of the cecum during SMA arteriography did not show persistence with fluoroscopy or transit in the bowel lumen and was felt to most likely relate to bowel gas rather than contrast extravasation. Electronically Signed   By: Aletta Edouard M.D.   On: 05/28/2016 08:35   Ir Angiogram Selective Each Additional Vessel  Result Date: 05/28/2016 CLINICAL DATA:  Lower GI Possible diverticular bleed EXAM: SELECTIVE VISCERAL ARTERIOGRAPHY ADDITIONAL ARTERIOGRAPHY IR ULTRASOUND GUIDANCE VASC ACCESS RIGHT IR EMBO ART  VEN HEMORR LYMPH EXTRAV  INC GUIDE ROADMAPPING ANESTHESIA/SEDATION: Intravenous Fentanyl and Versed were administered as conscious sedation during continuous monitoring of the patient's level of consciousness and physiological / cardiorespiratory status by the radiology RN, with a total moderate sedation time of 29 minutes. MEDICATIONS: Lidocaine 1% subcutaneous CONTRAST:  25 mL Visipaque 320 PROCEDURE: The procedure, risks (including but not limited to bleeding, infection, organ damage ), benefits, and alternatives were explained to the patient. Questions regarding the procedure were encouraged and answered. The patient understands and consents to the procedure. Right femoral region prepped and draped in usual  sterile fashion. Maximal barrier sterile technique was utilized including caps, mask, sterile gowns, sterile gloves, sterile drape, hand hygiene and skin antiseptic. The right common femoral artery was localized under ultrasound. Under real-time ultrasound guidance, the vessel was accessed with a 21-gauge micropuncture needle, exchanged over a 018 guidewire for a transitional dilator, through which a 035 guidewire was advanced. Over this, a 5 [place] vascular sheath was placed, through which a 5 [place] C2 catheter was advanced and used to selectively catheterize the superior mesenteric artery for selective arteriography . A coaxial renegade microcatheter was then advanced with an angled 018 guidewire in used to selectively catheterize second and third order mesenteric branches of the SMA. Confirmatory arteriography was performed. The catheter was advanced distally into the third order branch supplying the bleeding segment and the branch was embolized with 2 mm coils to cessation of flow, confirmed with contrast injection. The catheters and sheath were removed and hemostasis achieved with the aid of the Exoseal device after confirmatory femoral arteriography. The patient tolerated the procedure well. COMPLICATIONS: None immediate FINDINGS: Intermittent Active arterial extravasation supplied by a third order branch of the superior mesenteric artery, corresponding to region of extravasation seen on previous scintigraphy. Technically successful 2 mm coil embolization of distal mesentery branch supplying the affected segment. IMPRESSION: 1. Positive for active extravasation into the lumen of the proximal colon from a distal branch of the SMA. 2. Superselective 2 mm coil embolization of the supplying arterial branch performed without complication. Electronically Signed   By: Lucrezia Europe M.D.   On: 05/28/2016 13:56   Ir Angiogram Selective  Each Additional Vessel  Result Date: 05/28/2016 INDICATION: Acute lower  gastrointestinal tract bleeding with positive nuclear medicine bleeding scan localizing to the region of the hepatic flexure/ proximal transverse colon. EXAM: 1. ULTRASOUND GUIDANCE FOR VASCULAR ACCESS OF THE RIGHT COMMON FEMORAL ARTERY 2. SELECTIVE VISCERAL ARTERIOGRAPHY OF THE SUPERIOR MESENTERIC ARTERY 3. ADDITIONAL SELECTIVE ARTERIOGRAPHY OF THE SUPERIOR MESENTERIC ARTERY MEDICATIONS: None ANESTHESIA/SEDATION: Moderate (conscious) sedation was employed during this procedure. A total of Versed 1.0 mg and Fentanyl 25 mcg was administered intravenously. Moderate Sedation Time: 40 minutes. The patient's level of consciousness and vital signs were monitored continuously by radiology nursing throughout the procedure under my direct supervision. CONTRAST:  78 mL Isovue-300 FLUOROSCOPY TIME:  Fluoroscopy Time: 7 minutes and 30 seconds. 595 mGy. COMPLICATIONS: None immediate. PROCEDURE: Informed consent was obtained from the patient following explanation of the procedure, risks, benefits and alternatives. The patient understands, agrees and consents for the procedure. All questions were addressed. A time out was performed prior to the initiation of the procedure. Maximal barrier sterile technique utilized including caps, mask, sterile gowns, sterile gloves, large sterile drape, hand hygiene, and Betadine prep. Ultrasound was used to confirm patency of the right common femoral artery. Under ultrasound guidance, access of the artery was performed with a 21 gauge needle and micropuncture set. Ultrasound image documentation was performed. A 5 French vascular sheath was placed. A 5 French Cobra catheter was then advanced into the abdominal aorta an used to selectively catheterize the superior mesenteric artery. Selective arteriography was performed. The catheter was then further advanced passed a pancreaticoduodenal trunk and additional selective arteriography performed in 2 different projections. The Cobra catheter and  additional Sos catheter were used to attempt catheterization of the inferior mesenteric artery. Catheters were then removed. Oblique contrast injection was performed at the level of the femoral arteriotomy. Arteriotomy closure was performed with the Cordis Exoseal device. FINDINGS: SMA arteriography initially suggested potentially focal contrast accumulation along the medial aspect of the cecum in a rounded configuration. This was investigated under real-time fluoroscopy and did not show definitive contrast accumulation and was felt to relate to bowel gas. There also was no transit of density into the lumen of the adjacent cecum. No vascular malformations are identified. The IMA origin could not be located or selectively catheterized. IMPRESSION: No definitive active arterial bleeding identified in the SMA distribution. Focal area of density along the medial aspect of the cecum during SMA arteriography did not show persistence with fluoroscopy or transit in the bowel lumen and was felt to most likely relate to bowel gas rather than contrast extravasation. Electronically Signed   By: Aletta Edouard M.D.   On: 05/28/2016 08:35   Ir US Guide Vasc Access Right  Result Date: 05/28/2016 CLINICAL DATA:  Lower GI Possible diverticular bleed EXAM: SELECTIVE VISCERAL ARTERIOGRAPHY ADDITIONAL ARTERIOGRAPHY IR ULTRASOUND GUIDANCE VASC ACCESS RIGHT IR EMBO ART  VEN HEMORR LYMPH EXTRAV  INC GUIDE ROADMAPPING ANESTHESIA/SEDATION: Intravenous Fentanyl and Versed were administered as conscious sedation during continuous monitoring of the patient's level of consciousness and physiological / cardiorespiratory status by the radiology RN, with a total moderate sedation time of 29 minutes. MEDICATIONS: Lidocaine 1% subcutaneous CONTRAST:  25 mL Visipaque 320 PROCEDURE: The procedure, risks (including but not limited to bleeding, infection, organ damage ), benefits, and alternatives were explained to the patient. Questions regarding  the procedure were encouraged and answered. The patient understands and consents to the procedure. Right femoral region prepped and draped in usual sterile fashion. Maximal barrier sterile  technique was utilized including caps, mask, sterile gowns, sterile gloves, sterile drape, hand hygiene and skin antiseptic. The right common femoral artery was localized under ultrasound. Under real-time ultrasound guidance, the vessel was accessed with a 21-gauge micropuncture needle, exchanged over a 018 guidewire for a transitional dilator, through which a 035 guidewire was advanced. Over this, a 5 [place] vascular sheath was placed, through which a 5 [place] C2 catheter was advanced and used to selectively catheterize the superior mesenteric artery for selective arteriography . A coaxial renegade microcatheter was then advanced with an angled 018 guidewire in used to selectively catheterize second and third order mesenteric branches of the SMA. Confirmatory arteriography was performed. The catheter was advanced distally into the third order branch supplying the bleeding segment and the branch was embolized with 2 mm coils to cessation of flow, confirmed with contrast injection. The catheters and sheath were removed and hemostasis achieved with the aid of the Exoseal device after confirmatory femoral arteriography. The patient tolerated the procedure well. COMPLICATIONS: None immediate FINDINGS: Intermittent Active arterial extravasation supplied by a third order branch of the superior mesenteric artery, corresponding to region of extravasation seen on previous scintigraphy. Technically successful 2 mm coil embolization of distal mesentery branch supplying the affected segment. IMPRESSION: 1. Positive for active extravasation into the lumen of the proximal colon from a distal branch of the SMA. 2. Superselective 2 mm coil embolization of the supplying arterial branch performed without complication. Electronically Signed   By: Lucrezia Europe M.D.   On: 05/28/2016 13:56   Ir US Guide Vasc Access Right  Result Date: 05/28/2016 INDICATION: Acute lower gastrointestinal tract bleeding with positive nuclear medicine bleeding scan localizing to the region of the hepatic flexure/ proximal transverse colon. EXAM: 1. ULTRASOUND GUIDANCE FOR VASCULAR ACCESS OF THE RIGHT COMMON FEMORAL ARTERY 2. SELECTIVE VISCERAL ARTERIOGRAPHY OF THE SUPERIOR MESENTERIC ARTERY 3. ADDITIONAL SELECTIVE ARTERIOGRAPHY OF THE SUPERIOR MESENTERIC ARTERY MEDICATIONS: None ANESTHESIA/SEDATION: Moderate (conscious) sedation was employed during this procedure. A total of Versed 1.0 mg and Fentanyl 25 mcg was administered intravenously. Moderate Sedation Time: 40 minutes. The patient's level of consciousness and vital signs were monitored continuously by radiology nursing throughout the procedure under my direct supervision. CONTRAST:  78 mL Isovue-300 FLUOROSCOPY TIME:  Fluoroscopy Time: 7 minutes and 30 seconds. 595 mGy. COMPLICATIONS: None immediate. PROCEDURE: Informed consent was obtained from the patient following explanation of the procedure, risks, benefits and alternatives. The patient understands, agrees and consents for the procedure. All questions were addressed. A time out was performed prior to the initiation of the procedure. Maximal barrier sterile technique utilized including caps, mask, sterile gowns, sterile gloves, large sterile drape, hand hygiene, and Betadine prep. Ultrasound was used to confirm patency of the right common femoral artery. Under ultrasound guidance, access of the artery was performed with a 21 gauge needle and micropuncture set. Ultrasound image documentation was performed. A 5 French vascular sheath was placed. A 5 French Cobra catheter was then advanced into the abdominal aorta an used to selectively catheterize the superior mesenteric artery. Selective arteriography was performed. The catheter was then further advanced passed a  pancreaticoduodenal trunk and additional selective arteriography performed in 2 different projections. The Cobra catheter and additional Sos catheter were used to attempt catheterization of the inferior mesenteric artery. Catheters were then removed. Oblique contrast injection was performed at the level of the femoral arteriotomy. Arteriotomy closure was performed with the Cordis Exoseal device. FINDINGS: SMA arteriography initially suggested potentially focal contrast accumulation along  the medial aspect of the cecum in a rounded configuration. This was investigated under real-time fluoroscopy and did not show definitive contrast accumulation and was felt to relate to bowel gas. There also was no transit of density into the lumen of the adjacent cecum. No vascular malformations are identified. The IMA origin could not be located or selectively catheterized. IMPRESSION: No definitive active arterial bleeding identified in the SMA distribution. Focal area of density along the medial aspect of the cecum during SMA arteriography did not show persistence with fluoroscopy or transit in the bowel lumen and was felt to most likely relate to bowel gas rather than contrast extravasation. Electronically Signed   By: Aletta Edouard M.D.   On: 05/28/2016 08:35   Dg Chest Port 1 View  Result Date: 05/28/2016 CLINICAL DATA:  GI bleed.  Line placement. EXAM: PORTABLE CHEST 1 VIEW COMPARISON:  None. FINDINGS: Low lung volumes. Borderline cardiac enlargement. Thoracic atherosclerosis. RIGHT IJ central venous line tip SVC RA junction. No pneumothorax. Bibasilar scarring without infiltrates or significant atelectasis. No effusion. Osteopenia. IMPRESSION: Chronic changes as described. RIGHT IJ catheter tip SVC RA junction.  No pneumothorax. Electronically Signed   By: Staci Righter M.D.   On: 05/28/2016 09:20   Ir Embo Art  Lawson Fiscal Hemorr Lymph [employer] Guide Roadmapping  Result Date: 05/28/2016 CLINICAL DATA:  Lower GI  Possible diverticular bleed EXAM: SELECTIVE VISCERAL ARTERIOGRAPHY ADDITIONAL ARTERIOGRAPHY IR ULTRASOUND GUIDANCE VASC ACCESS RIGHT IR EMBO ART  VEN HEMORR LYMPH EXTRAV  INC GUIDE ROADMAPPING ANESTHESIA/SEDATION: Intravenous Fentanyl and Versed were administered as conscious sedation during continuous monitoring of the patient's level of consciousness and physiological / cardiorespiratory status by the radiology RN, with a total moderate sedation time of 29 minutes. MEDICATIONS: Lidocaine 1% subcutaneous CONTRAST:  25 mL Visipaque 320 PROCEDURE: The procedure, risks (including but not limited to bleeding, infection, organ damage ), benefits, and alternatives were explained to the patient. Questions regarding the procedure were encouraged and answered. The patient understands and consents to the procedure. Right femoral region prepped and draped in usual sterile fashion. Maximal barrier sterile technique was utilized including caps, mask, sterile gowns, sterile gloves, sterile drape, hand hygiene and skin antiseptic. The right common femoral artery was localized under ultrasound. Under real-time ultrasound guidance, the vessel was accessed with a 21-gauge micropuncture needle, exchanged over a 018 guidewire for a transitional dilator, through which a 035 guidewire was advanced. Over this, a 5 [place] vascular sheath was placed, through which a 5 [place] C2 catheter was advanced and used to selectively catheterize the superior mesenteric artery for selective arteriography . A coaxial renegade microcatheter was then advanced with an angled 018 guidewire in used to selectively catheterize second and third order mesenteric branches of the SMA. Confirmatory arteriography was performed. The catheter was advanced distally into the third order branch supplying the bleeding segment and the branch was embolized with 2 mm coils to cessation of flow, confirmed with contrast injection. The catheters and sheath were removed and  hemostasis achieved with the aid of the Exoseal device after confirmatory femoral arteriography. The patient tolerated the procedure well. COMPLICATIONS: None immediate FINDINGS: Intermittent Active arterial extravasation supplied by a third order branch of the superior mesenteric artery, corresponding to region of extravasation seen on previous scintigraphy. Technically successful 2 mm coil embolization of distal mesentery branch supplying the affected segment. IMPRESSION: 1. Positive for active extravasation into the lumen of the proximal colon from a distal branch of the SMA. 2. Superselective 2 mm  coil embolization of the supplying arterial branch performed without complication. Electronically Signed   By: Lucrezia Europe M.D.   On: 05/28/2016 13:56   Assessment / Plan: -Massive LGIB:  Suspect diverticular.  Bleeding scan positive, initially angio thought to be negative but at re-look it was positive and patient ended up having coil embolization of distal branch of SMA in prox colon 3/20.  No further bleeding. -ABLA:  Hgb 6.6 grams again this AM.  Going to be given more PRBC's. -Hemorrhagic shock:  Resolved.  No longer on pressors. -Hyperkalemia:  Potassium 5.2 today.   LOS: 2 days   ZEHR, JESSICA D.  05/29/2016, 9:03 AM Pager number [phone]  GI ATTENDING  Interval history data reviewed. Patient seen and examined. Agree with above interval progress note. Patient doing well without evidence of recurrent bleeding. Significant anemia for which she is to receive transfusion therapy. Okay to advance diet as tolerated and monitor for any evidence of recurrent bleeding. GI available for questions or problems. Will sign off.  Docia Chuck. Geri Seminole., M.D. [facility] Division of Gastroenterology

## 2016-05-29 NOTE — Progress Notes (Signed)
Referring Physician(s): Sheran Luz  Supervising Physician: Jacqulynn Cadet  Patient Status:  [name] - In-pt  Chief Complaint:  GI bleed  Subjective: Patient doing fairly well today. He did have one small dark bowel movement around 11:00 today. He has had no further bright red bleeding per rectum. He denies abdominal pain, nausea or vomiting.   Allergies: Patient has no known allergies.  Medications: Prior to Admission medications   Medication Sig Start Date End Date Taking? Authorizing Provider  aspirin 81 MG tablet Take 81 mg by mouth daily.   Yes Historical Provider, MD  esomeprazole (NEXIUM) 40 MG capsule TAKE ONE CAPSULE BY MOUTH DAILY AT NOON 04/14/16  Yes [name], MD  glimepiride (AMARYL) 2 MG tablet TAKE 2 TABLETS(4 MG) BY MOUTH DAILY BEFORE DINNER Patient taking differently: take '2mg'$  by mouth with dinner 04/05/16  Yes Elayne Snare, MD  Insulin Glargine (TOUJEO SOLOSTAR) 300 UNIT/ML SOPN Inject 12 Units into the skin daily. 04/10/16  Yes Elayne Snare, MD  levothyroxine (SYNTHROID, LEVOTHROID) 125 MCG tablet Take 1 tablet (125 mcg total) by mouth daily before breakfast. 05/07/16  Yes Elayne Snare, MD  liraglutide (VICTOZA) 18 MG/3ML SOPN ADMINISTER 1.2 MG UNDER THE SKIN DAILY 05/07/16  Yes Elayne Snare, MD  metFORMIN (GLUCOPHAGE-XR) 750 MG 24 hr tablet TAKE 2 TABLETS(1500 MG) BY MOUTH DAILY WITH BREAKFAST 05/23/16  Yes Elayne Snare, MD  naproxen sodium (ANAPROX) 220 MG tablet Take 440 mg by mouth 2 (two) times daily with a meal.   Yes Historical Provider, MD  simvastatin (ZOCOR) 40 MG tablet TAKE 1 TABLET(40 MG) BY MOUTH AT BEDTIME 02/04/16  Yes Chelle Jeffery, PA-C  tamsulosin (FLOMAX) 0.4 MG CAPS capsule TAKE 1 CAPSULE BY MOUTH EVERY DAY 03/24/16  Yes [name], MD  B-D UF III MINI PEN NEEDLES 31G X 5 MM MISC USE ONE PER DAY WITH VICTOZA 11/22/15   Elayne Snare, MD  Blood Glucose Monitoring Suppl (ACCU-CHEK NANO SMARTVIEW) W/DEVICE KIT  12/02/13   Historical Provider, MD    FLUAD 0.5 ML SUSY  11/30/15   Historical Provider, MD  glucose blood (ACCU-CHEK SMARTVIEW) test strip Test blood sugar twice daily 04/10/16   Elayne Snare, MD     Vital Signs: BP (!) 164/67   Pulse 72   Temp 98.6 F (37 C) (Oral)   Resp 15   Ht '5\' 7"'$  (1.702 m)   Wt 175 lb 4.3 oz (79.5 kg)   SpO2 100%   BMI 27.45 kg/m   Physical Exam awake, alert. Abdomen soft, positive bowel sounds, nontender. Puncture site right common femoral artery clean, dry, soft, nontender, no hematoma. Intact distal pulses.  Imaging: Nm Gi Blood Loss  Result Date: 05/28/2016 CLINICAL DATA:  83 year old Zachary Cook with bloody stools. EXAM: NUCLEAR MEDICINE GASTROINTESTINAL BLEEDING SCAN TECHNIQUE: Sequential abdominal images were obtained following intravenous administration of Tc-65mlabeled red blood cells. RADIOPHARMACEUTICALS:  21.1 mCi Tc-913mn-vitro labeled red cells. COMPARISON:  None. FINDINGS: There is physiologic uptake in the liver spleen and cardiac blood pool as well as uptake within the aorta and IVC. There is radiotracer activity in the right upper abdomen conforming to the location of the hepatic flexure and proximal transverse colon consistent with active GI bleed. IMPRESSION: Active GI bleed in the hepatic flexure/ proximal transverse colon. These results were called by telephone at the time of interpretation on 05/28/2016 at 2:36 am to Dr. JAJennette Kettle who verbally acknowledged these results. Electronically Signed   By: ArLaren Everts.  On: 05/28/2016 02:58   Ir Angiogram Visceral Selective  Result Date: 05/28/2016 CLINICAL DATA:  Lower GI Possible diverticular bleed EXAM: SELECTIVE VISCERAL ARTERIOGRAPHY ADDITIONAL ARTERIOGRAPHY IR ULTRASOUND GUIDANCE VASC ACCESS RIGHT IR EMBO ART  VEN HEMORR LYMPH EXTRAV  INC GUIDE ROADMAPPING ANESTHESIA/SEDATION: Intravenous Fentanyl and Versed were administered as conscious sedation during continuous monitoring of the patient's level of consciousness and  physiological / cardiorespiratory status by the radiology RN, with a total moderate sedation time of 29 minutes. MEDICATIONS: Lidocaine 1% subcutaneous CONTRAST:  25 mL Visipaque 320 PROCEDURE: The procedure, risks (including but not limited to bleeding, infection, organ damage ), benefits, and alternatives were explained to the patient. Questions regarding the procedure were encouraged and answered. The patient understands and consents to the procedure. Right femoral region prepped and draped in usual sterile fashion. Maximal barrier sterile technique was utilized including caps, mask, sterile gowns, sterile gloves, sterile drape, hand hygiene and skin antiseptic. The right common femoral artery was localized under ultrasound. Under real-time ultrasound guidance, the vessel was accessed with a 21-gauge micropuncture needle, exchanged over a 018 guidewire for a transitional dilator, through which a 035 guidewire was advanced. Over this, a 5 [place] vascular sheath was placed, through which a 5 [place] C2 catheter was advanced and used to selectively catheterize the superior mesenteric artery for selective arteriography . A coaxial renegade microcatheter was then advanced with an angled 018 guidewire in used to selectively catheterize second and third order mesenteric branches of the SMA. Confirmatory arteriography was performed. The catheter was advanced distally into the third order branch supplying the bleeding segment and the branch was embolized with 2 mm coils to cessation of flow, confirmed with contrast injection. The catheters and sheath were removed and hemostasis achieved with the aid of the Exoseal device after confirmatory femoral arteriography. The patient tolerated the procedure well. COMPLICATIONS: None immediate FINDINGS: Intermittent Active arterial extravasation supplied by a third order branch of the superior mesenteric artery, corresponding to region of extravasation seen on previous scintigraphy.  Technically successful 2 mm coil embolization of distal mesentery branch supplying the affected segment. IMPRESSION: 1. Positive for active extravasation into the lumen of the proximal colon from a distal branch of the SMA. 2. Superselective 2 mm coil embolization of the supplying arterial branch performed without complication. Electronically Signed   By: Lucrezia Europe M.D.   On: 05/28/2016 13:56   Ir Angiogram Visceral Selective  Result Date: 05/28/2016 INDICATION: Acute lower gastrointestinal tract bleeding with positive nuclear medicine bleeding scan localizing to the region of the hepatic flexure/ proximal transverse colon. EXAM: 1. ULTRASOUND GUIDANCE FOR VASCULAR ACCESS OF THE RIGHT COMMON FEMORAL ARTERY 2. SELECTIVE VISCERAL ARTERIOGRAPHY OF THE SUPERIOR MESENTERIC ARTERY 3. ADDITIONAL SELECTIVE ARTERIOGRAPHY OF THE SUPERIOR MESENTERIC ARTERY MEDICATIONS: None ANESTHESIA/SEDATION: Moderate (conscious) sedation was employed during this procedure. A total of Versed 1.0 mg and Fentanyl 25 mcg was administered intravenously. Moderate Sedation Time: 40 minutes. The patient's level of consciousness and vital signs were monitored continuously by radiology nursing throughout the procedure under my direct supervision. CONTRAST:  78 mL Isovue-300 FLUOROSCOPY TIME:  Fluoroscopy Time: 7 minutes and 30 seconds. 595 mGy. COMPLICATIONS: None immediate. PROCEDURE: Informed consent was obtained from the patient following explanation of the procedure, risks, benefits and alternatives. The patient understands, agrees and consents for the procedure. All questions were addressed. A time out was performed prior to the initiation of the procedure. Maximal barrier sterile technique utilized including caps, mask, sterile gowns, sterile gloves, large sterile drape, hand  hygiene, and Betadine prep. Ultrasound was used to confirm patency of the right common femoral artery. Under ultrasound guidance, access of the artery was performed  with a 21 gauge needle and micropuncture set. Ultrasound image documentation was performed. A 5 French vascular sheath was placed. A 5 French Cobra catheter was then advanced into the abdominal aorta an used to selectively catheterize the superior mesenteric artery. Selective arteriography was performed. The catheter was then further advanced passed a pancreaticoduodenal trunk and additional selective arteriography performed in 2 different projections. The Cobra catheter and additional Sos catheter were used to attempt catheterization of the inferior mesenteric artery. Catheters were then removed. Oblique contrast injection was performed at the level of the femoral arteriotomy. Arteriotomy closure was performed with the Cordis Exoseal device. FINDINGS: SMA arteriography initially suggested potentially focal contrast accumulation along the medial aspect of the cecum in a rounded configuration. This was investigated under real-time fluoroscopy and did not show definitive contrast accumulation and was felt to relate to bowel gas. There also was no transit of density into the lumen of the adjacent cecum. No vascular malformations are identified. The IMA origin could not be located or selectively catheterized. IMPRESSION: No definitive active arterial bleeding identified in the SMA distribution. Focal area of density along the medial aspect of the cecum during SMA arteriography did not show persistence with fluoroscopy or transit in the bowel lumen and was felt to most likely relate to bowel gas rather than contrast extravasation. Electronically Signed   By: Aletta Edouard M.D.   On: 05/28/2016 08:35   Ir Angiogram Selective Each Additional Vessel  Result Date: 05/28/2016 CLINICAL DATA:  Lower GI Possible diverticular bleed EXAM: SELECTIVE VISCERAL ARTERIOGRAPHY ADDITIONAL ARTERIOGRAPHY IR ULTRASOUND GUIDANCE VASC ACCESS RIGHT IR EMBO ART  VEN HEMORR LYMPH EXTRAV  INC GUIDE ROADMAPPING ANESTHESIA/SEDATION:  Intravenous Fentanyl and Versed were administered as conscious sedation during continuous monitoring of the patient's level of consciousness and physiological / cardiorespiratory status by the radiology RN, with a total moderate sedation time of 29 minutes. MEDICATIONS: Lidocaine 1% subcutaneous CONTRAST:  25 mL Visipaque 320 PROCEDURE: The procedure, risks (including but not limited to bleeding, infection, organ damage ), benefits, and alternatives were explained to the patient. Questions regarding the procedure were encouraged and answered. The patient understands and consents to the procedure. Right femoral region prepped and draped in usual sterile fashion. Maximal barrier sterile technique was utilized including caps, mask, sterile gowns, sterile gloves, sterile drape, hand hygiene and skin antiseptic. The right common femoral artery was localized under ultrasound. Under real-time ultrasound guidance, the vessel was accessed with a 21-gauge micropuncture needle, exchanged over a 018 guidewire for a transitional dilator, through which a 035 guidewire was advanced. Over this, a 5 [place] vascular sheath was placed, through which a 5 [place] C2 catheter was advanced and used to selectively catheterize the superior mesenteric artery for selective arteriography . A coaxial renegade microcatheter was then advanced with an angled 018 guidewire in used to selectively catheterize second and third order mesenteric branches of the SMA. Confirmatory arteriography was performed. The catheter was advanced distally into the third order branch supplying the bleeding segment and the branch was embolized with 2 mm coils to cessation of flow, confirmed with contrast injection. The catheters and sheath were removed and hemostasis achieved with the aid of the Exoseal device after confirmatory femoral arteriography. The patient tolerated the procedure well. COMPLICATIONS: None immediate FINDINGS: Intermittent Active arterial  extravasation supplied by a third order branch of the  superior mesenteric artery, corresponding to region of extravasation seen on previous scintigraphy. Technically successful 2 mm coil embolization of distal mesentery branch supplying the affected segment. IMPRESSION: 1. Positive for active extravasation into the lumen of the proximal colon from a distal branch of the SMA. 2. Superselective 2 mm coil embolization of the supplying arterial branch performed without complication. Electronically Signed   By: Lucrezia Europe M.D.   On: 05/28/2016 13:56   Ir Angiogram Selective Each Additional Vessel  Result Date: 05/28/2016 INDICATION: Acute lower gastrointestinal tract bleeding with positive nuclear medicine bleeding scan localizing to the region of the hepatic flexure/ proximal transverse colon. EXAM: 1. ULTRASOUND GUIDANCE FOR VASCULAR ACCESS OF THE RIGHT COMMON FEMORAL ARTERY 2. SELECTIVE VISCERAL ARTERIOGRAPHY OF THE SUPERIOR MESENTERIC ARTERY 3. ADDITIONAL SELECTIVE ARTERIOGRAPHY OF THE SUPERIOR MESENTERIC ARTERY MEDICATIONS: None ANESTHESIA/SEDATION: Moderate (conscious) sedation was employed during this procedure. A total of Versed 1.0 mg and Fentanyl 25 mcg was administered intravenously. Moderate Sedation Time: 40 minutes. The patient's level of consciousness and vital signs were monitored continuously by radiology nursing throughout the procedure under my direct supervision. CONTRAST:  78 mL Isovue-300 FLUOROSCOPY TIME:  Fluoroscopy Time: 7 minutes and 30 seconds. 595 mGy. COMPLICATIONS: None immediate. PROCEDURE: Informed consent was obtained from the patient following explanation of the procedure, risks, benefits and alternatives. The patient understands, agrees and consents for the procedure. All questions were addressed. A time out was performed prior to the initiation of the procedure. Maximal barrier sterile technique utilized including caps, mask, sterile gowns, sterile gloves, large sterile drape,  hand hygiene, and Betadine prep. Ultrasound was used to confirm patency of the right common femoral artery. Under ultrasound guidance, access of the artery was performed with a 21 gauge needle and micropuncture set. Ultrasound image documentation was performed. A 5 French vascular sheath was placed. A 5 French Cobra catheter was then advanced into the abdominal aorta an used to selectively catheterize the superior mesenteric artery. Selective arteriography was performed. The catheter was then further advanced passed a pancreaticoduodenal trunk and additional selective arteriography performed in 2 different projections. The Cobra catheter and additional Sos catheter were used to attempt catheterization of the inferior mesenteric artery. Catheters were then removed. Oblique contrast injection was performed at the level of the femoral arteriotomy. Arteriotomy closure was performed with the Cordis Exoseal device. FINDINGS: SMA arteriography initially suggested potentially focal contrast accumulation along the medial aspect of the cecum in a rounded configuration. This was investigated under real-time fluoroscopy and did not show definitive contrast accumulation and was felt to relate to bowel gas. There also was no transit of density into the lumen of the adjacent cecum. No vascular malformations are identified. The IMA origin could not be located or selectively catheterized. IMPRESSION: No definitive active arterial bleeding identified in the SMA distribution. Focal area of density along the medial aspect of the cecum during SMA arteriography did not show persistence with fluoroscopy or transit in the bowel lumen and was felt to most likely relate to bowel gas rather than contrast extravasation. Electronically Signed   By: Aletta Edouard M.D.   On: 05/28/2016 08:35   Ir US Guide Vasc Access Right  Result Date: 05/28/2016 CLINICAL DATA:  Lower GI Possible diverticular bleed EXAM: SELECTIVE VISCERAL ARTERIOGRAPHY  ADDITIONAL ARTERIOGRAPHY IR ULTRASOUND GUIDANCE VASC ACCESS RIGHT IR EMBO ART  VEN HEMORR LYMPH EXTRAV  INC GUIDE ROADMAPPING ANESTHESIA/SEDATION: Intravenous Fentanyl and Versed were administered as conscious sedation during continuous monitoring of the patient's level of consciousness and  physiological / cardiorespiratory status by the radiology RN, with a total moderate sedation time of 29 minutes. MEDICATIONS: Lidocaine 1% subcutaneous CONTRAST:  25 mL Visipaque 320 PROCEDURE: The procedure, risks (including but not limited to bleeding, infection, organ damage ), benefits, and alternatives were explained to the patient. Questions regarding the procedure were encouraged and answered. The patient understands and consents to the procedure. Right femoral region prepped and draped in usual sterile fashion. Maximal barrier sterile technique was utilized including caps, mask, sterile gowns, sterile gloves, sterile drape, hand hygiene and skin antiseptic. The right common femoral artery was localized under ultrasound. Under real-time ultrasound guidance, the vessel was accessed with a 21-gauge micropuncture needle, exchanged over a 018 guidewire for a transitional dilator, through which a 035 guidewire was advanced. Over this, a 5 [place] vascular sheath was placed, through which a 5 [place] C2 catheter was advanced and used to selectively catheterize the superior mesenteric artery for selective arteriography . A coaxial renegade microcatheter was then advanced with an angled 018 guidewire in used to selectively catheterize second and third order mesenteric branches of the SMA. Confirmatory arteriography was performed. The catheter was advanced distally into the third order branch supplying the bleeding segment and the branch was embolized with 2 mm coils to cessation of flow, confirmed with contrast injection. The catheters and sheath were removed and hemostasis achieved with the aid of the Exoseal device after  confirmatory femoral arteriography. The patient tolerated the procedure well. COMPLICATIONS: None immediate FINDINGS: Intermittent Active arterial extravasation supplied by a third order branch of the superior mesenteric artery, corresponding to region of extravasation seen on previous scintigraphy. Technically successful 2 mm coil embolization of distal mesentery branch supplying the affected segment. IMPRESSION: 1. Positive for active extravasation into the lumen of the proximal colon from a distal branch of the SMA. 2. Superselective 2 mm coil embolization of the supplying arterial branch performed without complication. Electronically Signed   By: Lucrezia Europe M.D.   On: 05/28/2016 13:56   Ir US Guide Vasc Access Right  Result Date: 05/28/2016 INDICATION: Acute lower gastrointestinal tract bleeding with positive nuclear medicine bleeding scan localizing to the region of the hepatic flexure/ proximal transverse colon. EXAM: 1. ULTRASOUND GUIDANCE FOR VASCULAR ACCESS OF THE RIGHT COMMON FEMORAL ARTERY 2. SELECTIVE VISCERAL ARTERIOGRAPHY OF THE SUPERIOR MESENTERIC ARTERY 3. ADDITIONAL SELECTIVE ARTERIOGRAPHY OF THE SUPERIOR MESENTERIC ARTERY MEDICATIONS: None ANESTHESIA/SEDATION: Moderate (conscious) sedation was employed during this procedure. A total of Versed 1.0 mg and Fentanyl 25 mcg was administered intravenously. Moderate Sedation Time: 40 minutes. The patient's level of consciousness and vital signs were monitored continuously by radiology nursing throughout the procedure under my direct supervision. CONTRAST:  78 mL Isovue-300 FLUOROSCOPY TIME:  Fluoroscopy Time: 7 minutes and 30 seconds. 595 mGy. COMPLICATIONS: None immediate. PROCEDURE: Informed consent was obtained from the patient following explanation of the procedure, risks, benefits and alternatives. The patient understands, agrees and consents for the procedure. All questions were addressed. A time out was performed prior to the initiation of the  procedure. Maximal barrier sterile technique utilized including caps, mask, sterile gowns, sterile gloves, large sterile drape, hand hygiene, and Betadine prep. Ultrasound was used to confirm patency of the right common femoral artery. Under ultrasound guidance, access of the artery was performed with a 21 gauge needle and micropuncture set. Ultrasound image documentation was performed. A 5 French vascular sheath was placed. A 5 French Cobra catheter was then advanced into the abdominal aorta an used to selectively catheterize the  superior mesenteric artery. Selective arteriography was performed. The catheter was then further advanced passed a pancreaticoduodenal trunk and additional selective arteriography performed in 2 different projections. The Cobra catheter and additional Sos catheter were used to attempt catheterization of the inferior mesenteric artery. Catheters were then removed. Oblique contrast injection was performed at the level of the femoral arteriotomy. Arteriotomy closure was performed with the Cordis Exoseal device. FINDINGS: SMA arteriography initially suggested potentially focal contrast accumulation along the medial aspect of the cecum in a rounded configuration. This was investigated under real-time fluoroscopy and did not show definitive contrast accumulation and was felt to relate to bowel gas. There also was no transit of density into the lumen of the adjacent cecum. No vascular malformations are identified. The IMA origin could not be located or selectively catheterized. IMPRESSION: No definitive active arterial bleeding identified in the SMA distribution. Focal area of density along the medial aspect of the cecum during SMA arteriography did not show persistence with fluoroscopy or transit in the bowel lumen and was felt to most likely relate to bowel gas rather than contrast extravasation. Electronically Signed   By: Aletta Edouard M.D.   On: 05/28/2016 08:35   Dg Chest Port 1  View  Result Date: 05/28/2016 CLINICAL DATA:  GI bleed.  Line placement. EXAM: PORTABLE CHEST 1 VIEW COMPARISON:  None. FINDINGS: Low lung volumes. Borderline cardiac enlargement. Thoracic atherosclerosis. RIGHT IJ central venous line tip SVC RA junction. No pneumothorax. Bibasilar scarring without infiltrates or significant atelectasis. No effusion. Osteopenia. IMPRESSION: Chronic changes as described. RIGHT IJ catheter tip SVC RA junction.  No pneumothorax. Electronically Signed   By: Staci Righter M.D.   On: 05/28/2016 09:20   Ir Embo Art  Lawson Fiscal Hemorr Lymph [employer] Guide Roadmapping  Result Date: 05/28/2016 CLINICAL DATA:  Lower GI Possible diverticular bleed EXAM: SELECTIVE VISCERAL ARTERIOGRAPHY ADDITIONAL ARTERIOGRAPHY IR ULTRASOUND GUIDANCE VASC ACCESS RIGHT IR EMBO ART  VEN HEMORR LYMPH EXTRAV  INC GUIDE ROADMAPPING ANESTHESIA/SEDATION: Intravenous Fentanyl and Versed were administered as conscious sedation during continuous monitoring of the patient's level of consciousness and physiological / cardiorespiratory status by the radiology RN, with a total moderate sedation time of 29 minutes. MEDICATIONS: Lidocaine 1% subcutaneous CONTRAST:  25 mL Visipaque 320 PROCEDURE: The procedure, risks (including but not limited to bleeding, infection, organ damage ), benefits, and alternatives were explained to the patient. Questions regarding the procedure were encouraged and answered. The patient understands and consents to the procedure. Right femoral region prepped and draped in usual sterile fashion. Maximal barrier sterile technique was utilized including caps, mask, sterile gowns, sterile gloves, sterile drape, hand hygiene and skin antiseptic. The right common femoral artery was localized under ultrasound. Under real-time ultrasound guidance, the vessel was accessed with a 21-gauge micropuncture needle, exchanged over a 018 guidewire for a transitional dilator, through which a 035 guidewire was  advanced. Over this, a 5 [place] vascular sheath was placed, through which a 5 [place] C2 catheter was advanced and used to selectively catheterize the superior mesenteric artery for selective arteriography . A coaxial renegade microcatheter was then advanced with an angled 018 guidewire in used to selectively catheterize second and third order mesenteric branches of the SMA. Confirmatory arteriography was performed. The catheter was advanced distally into the third order branch supplying the bleeding segment and the branch was embolized with 2 mm coils to cessation of flow, confirmed with contrast injection. The catheters and sheath were removed and hemostasis achieved with the aid of the  Exoseal device after confirmatory femoral arteriography. The patient tolerated the procedure well. COMPLICATIONS: None immediate FINDINGS: Intermittent Active arterial extravasation supplied by a third order branch of the superior mesenteric artery, corresponding to region of extravasation seen on previous scintigraphy. Technically successful 2 mm coil embolization of distal mesentery branch supplying the affected segment. IMPRESSION: 1. Positive for active extravasation into the lumen of the proximal colon from a distal branch of the SMA. 2. Superselective 2 mm coil embolization of the supplying arterial branch performed without complication. Electronically Signed   By: Lucrezia Europe M.D.   On: 05/28/2016 13:56    Labs:  CBC:  Recent Labs  05/27/16 1954 05/28/16 0620 05/28/16 1605 05/29/16 0304 05/29/16 0900  WBC 10.6* 14.7* 10.5  --  9.3  HGB 7.7* 6.6* 7.7* 6.6* 7.7*  HCT 24.0* 19.2* 22.2* 18.9* 22.1*  PLT 202 160 178  --  148*    COAGS:  Recent Labs  05/27/16 1954 05/28/16 1605  INR 1.16 1.26    BMP:  Recent Labs  05/27/16 1954 05/28/16 0321 05/28/16 0620 05/28/16 1605 05/29/16 0304 05/29/16 0900  NA 139 138  --  139  --  139  K 6.5* 6.5* 5.9* 5.7* 5.2* 5.1  CL 113* 117*  --  117*  --  115*   CO2 17* 18*  --  19*  --  20*  GLUCOSE 292* 177*  --  172*  --  168*  BUN 34* 38*  --  37*  --  35*  CALCIUM 8.3* 7.5*  --  7.2*  --  7.5*  CREATININE 2.08* 2.08*  --  1.96*  --  1.72*  GFRNONAA 28* 28*  --  30*  --  36*  GFRAA 33* 33*  --  35*  --  41*    LIVER FUNCTION TESTS:  Recent Labs  03/08/16 0943 05/27/16 1954 05/28/16 1605  BILITOT 0.8 1.1 0.5  AST 13 33 20  ALT 10 13* 15*  ALKPHOS 59 41 31*  PROT 7.3 5.6* 4.3*  ALBUMIN 4.2 2.6* 2.3*    Assessment and Plan: Patient with history of GI bleed ,status post superselective 2 mm coil embolization of supplying arterial distal branch of SMA 3/20; no further bright red blood per rectum. AF; latest hgb 7.7; has received additional blood since angio; creat 1.72(1.96); hydrate, continue to monitor labs/stools closely. Additional plans per critical care, primary, GI.   Electronically Signed: D. Rowe Robert 05/29/2016, 4:29 PM   I spent a total of 15 minutes at the the patient's bedside AND on the patient's hospital floor or unit, greater than 50% of which was counseling/coordinating care for visceral arteriogram with embolization    Patient ID: Zachary Cook, Zachary Cook   DOB: 1932/03/28, 83 y.o.   MRN: [id number]

## 2016-05-30 LAB — TYPE AND SCREEN
ABO/RH(D): A POS
ANTIBODY SCREEN: NEGATIVE
UNIT DIVISION: 0
Unit division: 0
Unit division: 0
Unit division: 0
Unit division: 0

## 2016-05-30 LAB — BPAM RBC
BLOOD PRODUCT EXPIRATION DATE: 201804122359
BLOOD PRODUCT EXPIRATION DATE: 201804122359
Blood Product Expiration Date: 201804132359
Blood Product Expiration Date: 201804132359
Blood Product Expiration Date: 201804152359
ISSUE DATE / TIME: 201803192125
ISSUE DATE / TIME: 201803200729
ISSUE DATE / TIME: 201803201121
ISSUE DATE / TIME: 201803200100
ISSUE DATE / TIME: 201803210353
UNIT TYPE AND RH: 6200
UNIT TYPE AND RH: 6200
Unit Type and Rh: 6200
Unit Type and Rh: 6200
Unit Type and Rh: 6200

## 2016-05-30 LAB — GLUCOSE, CAPILLARY
GLUCOSE-CAPILLARY: 120 mg/dL — AB (ref 65–99)
GLUCOSE-CAPILLARY: 130 mg/dL — AB (ref 65–99)
GLUCOSE-CAPILLARY: 198 mg/dL — AB (ref 65–99)
GLUCOSE-CAPILLARY: 127 mg/dL — AB (ref 65–99)
GLUCOSE-CAPILLARY: 170 mg/dL — AB (ref 65–99)

## 2016-05-30 LAB — BASIC METABOLIC PANEL
ANION GAP: 3 — AB (ref 5–15)
BUN: 24 mg/dL — ABNORMAL HIGH (ref 6–20)
CALCIUM: 7.9 mg/dL — AB (ref 8.9–10.3)
CO2: 22 mmol/L (ref 22–32)
Chloride: 114 mmol/L — ABNORMAL HIGH (ref 101–111)
Creatinine, Ser: 1.32 mg/dL — ABNORMAL HIGH (ref 0.61–1.24)
GFR calc Af Amer: 57 mL/min — ABNORMAL LOW (ref >60)
GFR, EST NON AFRICAN AMERICAN: 49 mL/min — AB (ref >60)
Glucose, Bld: 123 mg/dL — ABNORMAL HIGH (ref 65–99)
Potassium: 4.9 mmol/L (ref 3.5–5.1)
Sodium: 139 mmol/L (ref 135–145)

## 2016-05-30 LAB — CBC
HCT: 21.5 % — ABNORMAL LOW (ref 39.0–52.0)
HEMOGLOBIN: 7.3 g/dL — AB (ref 13.0–17.0)
MCH: 29.4 pg (ref 26.0–34.0)
MCHC: 34 g/dL (ref 30.0–36.0)
MCV: 86.7 fL (ref 78.0–100.0)
Platelets: 123 10*3/uL — ABNORMAL LOW (ref 150–400)
RBC: 2.48 MIL/uL — AB (ref 4.22–5.81)
RDW: 15.9 % — ABNORMAL HIGH (ref 11.5–15.5)
WBC: 7.2 10*3/uL (ref 4.0–10.5)

## 2016-05-30 LAB — PROCALCITONIN: PROCALCITONIN: 2.68 ng/mL

## 2016-05-30 LAB — ALBUMIN: Albumin: 2.3 g/dL — ABNORMAL LOW (ref 3.5–5.0)

## 2016-05-30 MED ORDER — FERROUS SULFATE 325 (65 FE) MG PO TABS
325.0000 mg | ORAL_TABLET | Freq: Three times a day (TID) | ORAL | Status: DC
  Administered 2016-05-30 – 2016-05-31 (×3): 325 mg via ORAL
  Filled 2016-05-30 (×3): qty 1

## 2016-05-30 NOTE — Final Consult Note (Signed)
Consultant Final Sign-Off Note    Assessment/Final recommendations  Zachary Cook is a 83 y.o. male followed by me for GI bleed   Wound care (if applicable):    Diet at discharge: low fiber diet   Activity at discharge: per primary team   Follow-up appointment:  None. n/a   Pending results:  [employer]     Ordered   05/31/16 0500  CBC with Differential/Platelet  Tomorrow morning,   R    Question:  Specimen collection method  Answer:  Unit=Unit collect   05/30/16 0841   05/31/16 6160  Basic metabolic panel  Tomorrow morning,   R    Question:  Specimen collection method  Answer:  Unit=Unit collect   05/30/16 0841       Medication recommendations:   Other recommendations:Doing well No c/o Has some dietary questions Alert, nad Soft, nt, nd hgb stable  Adv diet as tolerated hgb stable - no sign addl bleed Nutrition consult for low fiber diet on dc. Would rec a low fiber diet for a few weeks with gradual transition to high fiber diet Doesn't need follow up with [facility] Signing off.     Thank you for allowing [place] to participate in the care of your patient!  Please consult [place] again if you have further needs for your patient.  [facility] M 05/30/2016 9:57 AM    Subjective     Objective  Vital signs in last 24 hours: Temp:  [97.6 F (36.4 C)-98.8 F (37.1 C)] 97.6 F (36.4 C) (03/22 0715) Pulse Rate:  [64-79] 64 (03/21 2350) Resp:  [13-26] 25 (03/22 0900) BP: (144-175)/(39-90) 150/90 (03/22 0800) SpO2:  [92 %-100 %] 96 % (03/22 0900)  General: See other note from 3/22   Pertinent labs and Studies:  Recent Labs  05/28/16 1605 05/29/16 0304 05/29/16 0900 05/30/16 0500  WBC 10.5  --  9.3 7.2  HGB 7.7* 6.6* 7.7* 7.3*  HCT 22.2* 18.9* 22.1* 21.5*   BMET  Recent Labs  05/29/16 0900 05/30/16 0500  NA 139 139  K 5.1 4.9  CL 115* 114*  CO2 20* 22  GLUCOSE 168* 123*  BUN 35* 24*  CREATININE 1.72* 1.32*  CALCIUM 7.5* 7.9*   No  results for input(s): LABURIN in the last 72 hours. Results for orders placed or performed during the hospital encounter of 05/27/16  MRSA PCR Screening     Status: None   Collection Time: 05/28/16  2:59 AM  Result Value Ref Range Status   MRSA by PCR NEGATIVE NEGATIVE Final    Comment:        The GeneXpert MRSA Assay (FDA approved for NASAL specimens only), is one component of a comprehensive MRSA colonization surveillance program. It is not intended to diagnose MRSA infection nor to guide or monitor treatment for MRSA infections.   Culture, blood (Routine X 2) w Reflex to ID Panel     Status: None (Preliminary result)   Collection Time: 05/28/16  4:16 PM  Result Value Ref Range Status   Specimen Description BLOOD BLOOD RIGHT ARM  Final   Special Requests BOTTLES DRAWN AEROBIC AND ANAEROBIC 7 CC EA  Final   Culture   Final    NO GROWTH < 24 HOURS Performed at [facility], [place] [address], [place], [place] [address]    Report Status PENDING  Incomplete  Culture, blood (Routine X 2) w Reflex to ID Panel     Status: None (  Preliminary result)   Collection Time: 05/28/16  4:26 PM  Result Value Ref Range Status   Specimen Description BLOOD BLOOD LEFT HAND  Final   Special Requests BOTTLES DRAWN AEROBIC AND ANAEROBIC 5.5 CC EA  Final   Culture   Final    NO GROWTH < 24 HOURS Performed at [facility], [place] [address], [place], [place] [address]    Report Status PENDING  Incomplete    Imaging: No results found.

## 2016-05-30 NOTE — Progress Notes (Signed)
Subjective: He feels much better. Medicine is advancing his diet and moving him upstairs to floor.  If he does well and H/H is stable he will go home tomorrow.  Objective: Vital signs in last 24 hours: Temp:  [98.4 F (36.9 C)-98.8 F (37.1 C)] 98.6 F (37 C) (03/22 0312) Pulse Rate:  [64-79] 64 (03/21 2350) Resp:  [13-26] 13 (03/22 0600) BP: (144-175)/(39-90) 150/90 (03/22 0800) SpO2:  [92 %-100 %] 96 % (03/22 0600) Last BM Date: 05/29/16 1355 IV 2675 Urine Stool x 1 Afebrile, VSS Creatinine is better at 1.32 H/H is stable and 7.3/21.5   Intake/Output from previous day: 03/21 0701 - 03/22 0700 In: 1355 [I.V.:1355] Out: 2675 [Urine:2675] Intake/Output this shift: No intake/output data recorded.  General appearance: alert, cooperative and no distress GI: soft, non-tender; bowel sounds normal; no masses,  no organomegaly and no further rectal bleeding.  Lab Results:   Recent Labs  05/29/16 0900 05/30/16 0500  WBC 9.3 7.2  HGB 7.7* 7.3*  HCT 22.1* 21.5*  PLT 148* 123*    BMET  Recent Labs  05/29/16 0900 05/30/16 0500  NA 139 139  K 5.1 4.9  CL 115* 114*  CO2 20* 22  GLUCOSE 168* 123*  BUN 35* 24*  CREATININE 1.72* 1.32*  CALCIUM 7.5* 7.9*   PT/INR  Recent Labs  05/27/16 1954 05/28/16 1605  LABPROT 14.9 15.9*  INR 1.16 1.26     Recent Labs Lab 05/27/16 1954 05/28/16 1605  AST 33 20  ALT 13* 15*  ALKPHOS 41 31*  BILITOT 1.1 0.5  PROT 5.6* 4.3*  ALBUMIN 2.6* 2.3*     Lipase  No results found for: LIPASE   Studies/Results: Ir Angiogram Visceral Selective  Result Date: 05/28/2016 CLINICAL DATA:  Lower GI Possible diverticular bleed EXAM: SELECTIVE VISCERAL ARTERIOGRAPHY ADDITIONAL ARTERIOGRAPHY IR ULTRASOUND GUIDANCE VASC ACCESS RIGHT IR EMBO ART  VEN HEMORR LYMPH EXTRAV  INC GUIDE ROADMAPPING ANESTHESIA/SEDATION: Intravenous Fentanyl and Versed were administered as conscious sedation during continuous monitoring of the patient's  level of consciousness and physiological / cardiorespiratory status by the radiology RN, with a total moderate sedation time of 29 minutes. MEDICATIONS: Lidocaine 1% subcutaneous CONTRAST:  25 mL Visipaque 320 PROCEDURE: The procedure, risks (including but not limited to bleeding, infection, organ damage ), benefits, and alternatives were explained to the patient. Questions regarding the procedure were encouraged and answered. The patient understands and consents to the procedure. Right femoral region prepped and draped in usual sterile fashion. Maximal barrier sterile technique was utilized including caps, mask, sterile gowns, sterile gloves, sterile drape, hand hygiene and skin antiseptic. The right common femoral artery was localized under ultrasound. Under real-time ultrasound guidance, the vessel was accessed with a 21-gauge micropuncture needle, exchanged over a 018 guidewire for a transitional dilator, through which a 035 guidewire was advanced. Over this, a 5 [place] vascular sheath was placed, through which a 5 [place] C2 catheter was advanced and used to selectively catheterize the superior mesenteric artery for selective arteriography . A coaxial renegade microcatheter was then advanced with an angled 018 guidewire in used to selectively catheterize second and third order mesenteric branches of the SMA. Confirmatory arteriography was performed. The catheter was advanced distally into the third order branch supplying the bleeding segment and the branch was embolized with 2 mm coils to cessation of flow, confirmed with contrast injection. The catheters and sheath were removed and hemostasis achieved with the aid of the Exoseal device after confirmatory femoral arteriography. The patient tolerated  the procedure well. COMPLICATIONS: None immediate FINDINGS: Intermittent Active arterial extravasation supplied by a third order branch of the superior mesenteric artery, corresponding to region of extravasation seen  on previous scintigraphy. Technically successful 2 mm coil embolization of distal mesentery branch supplying the affected segment. IMPRESSION: 1. Positive for active extravasation into the lumen of the proximal colon from a distal branch of the SMA. 2. Superselective 2 mm coil embolization of the supplying arterial branch performed without complication. Electronically Signed   By: Lucrezia Europe M.D.   On: 05/28/2016 13:56   Ir Angiogram Selective Each Additional Vessel  Result Date: 05/28/2016 CLINICAL DATA:  Lower GI Possible diverticular bleed EXAM: SELECTIVE VISCERAL ARTERIOGRAPHY ADDITIONAL ARTERIOGRAPHY IR ULTRASOUND GUIDANCE VASC ACCESS RIGHT IR EMBO ART  VEN HEMORR LYMPH EXTRAV  INC GUIDE ROADMAPPING ANESTHESIA/SEDATION: Intravenous Fentanyl and Versed were administered as conscious sedation during continuous monitoring of the patient's level of consciousness and physiological / cardiorespiratory status by the radiology RN, with a total moderate sedation time of 29 minutes. MEDICATIONS: Lidocaine 1% subcutaneous CONTRAST:  25 mL Visipaque 320 PROCEDURE: The procedure, risks (including but not limited to bleeding, infection, organ damage ), benefits, and alternatives were explained to the patient. Questions regarding the procedure were encouraged and answered. The patient understands and consents to the procedure. Right femoral region prepped and draped in usual sterile fashion. Maximal barrier sterile technique was utilized including caps, mask, sterile gowns, sterile gloves, sterile drape, hand hygiene and skin antiseptic. The right common femoral artery was localized under ultrasound. Under real-time ultrasound guidance, the vessel was accessed with a 21-gauge micropuncture needle, exchanged over a 018 guidewire for a transitional dilator, through which a 035 guidewire was advanced. Over this, a 5 [place] vascular sheath was placed, through which a 5 [place] C2 catheter was advanced and used to selectively  catheterize the superior mesenteric artery for selective arteriography . A coaxial renegade microcatheter was then advanced with an angled 018 guidewire in used to selectively catheterize second and third order mesenteric branches of the SMA. Confirmatory arteriography was performed. The catheter was advanced distally into the third order branch supplying the bleeding segment and the branch was embolized with 2 mm coils to cessation of flow, confirmed with contrast injection. The catheters and sheath were removed and hemostasis achieved with the aid of the Exoseal device after confirmatory femoral arteriography. The patient tolerated the procedure well. COMPLICATIONS: None immediate FINDINGS: Intermittent Active arterial extravasation supplied by a third order branch of the superior mesenteric artery, corresponding to region of extravasation seen on previous scintigraphy. Technically successful 2 mm coil embolization of distal mesentery branch supplying the affected segment. IMPRESSION: 1. Positive for active extravasation into the lumen of the proximal colon from a distal branch of the SMA. 2. Superselective 2 mm coil embolization of the supplying arterial branch performed without complication. Electronically Signed   By: Lucrezia Europe M.D.   On: 05/28/2016 13:56   Ir US Guide Vasc Access Right  Result Date: 05/28/2016 CLINICAL DATA:  Lower GI Possible diverticular bleed EXAM: SELECTIVE VISCERAL ARTERIOGRAPHY ADDITIONAL ARTERIOGRAPHY IR ULTRASOUND GUIDANCE VASC ACCESS RIGHT IR EMBO ART  VEN HEMORR LYMPH EXTRAV  INC GUIDE ROADMAPPING ANESTHESIA/SEDATION: Intravenous Fentanyl and Versed were administered as conscious sedation during continuous monitoring of the patient's level of consciousness and physiological / cardiorespiratory status by the radiology RN, with a total moderate sedation time of 29 minutes. MEDICATIONS: Lidocaine 1% subcutaneous CONTRAST:  25 mL Visipaque 320 PROCEDURE: The procedure, risks  (including but not limited  to bleeding, infection, organ damage ), benefits, and alternatives were explained to the patient. Questions regarding the procedure were encouraged and answered. The patient understands and consents to the procedure. Right femoral region prepped and draped in usual sterile fashion. Maximal barrier sterile technique was utilized including caps, mask, sterile gowns, sterile gloves, sterile drape, hand hygiene and skin antiseptic. The right common femoral artery was localized under ultrasound. Under real-time ultrasound guidance, the vessel was accessed with a 21-gauge micropuncture needle, exchanged over a 018 guidewire for a transitional dilator, through which a 035 guidewire was advanced. Over this, a 5 [place] vascular sheath was placed, through which a 5 [place] C2 catheter was advanced and used to selectively catheterize the superior mesenteric artery for selective arteriography . A coaxial renegade microcatheter was then advanced with an angled 018 guidewire in used to selectively catheterize second and third order mesenteric branches of the SMA. Confirmatory arteriography was performed. The catheter was advanced distally into the third order branch supplying the bleeding segment and the branch was embolized with 2 mm coils to cessation of flow, confirmed with contrast injection. The catheters and sheath were removed and hemostasis achieved with the aid of the Exoseal device after confirmatory femoral arteriography. The patient tolerated the procedure well. COMPLICATIONS: None immediate FINDINGS: Intermittent Active arterial extravasation supplied by a third order branch of the superior mesenteric artery, corresponding to region of extravasation seen on previous scintigraphy. Technically successful 2 mm coil embolization of distal mesentery branch supplying the affected segment. IMPRESSION: 1. Positive for active extravasation into the lumen of the proximal colon from a distal branch of  the SMA. 2. Superselective 2 mm coil embolization of the supplying arterial branch performed without complication. Electronically Signed   By: Lucrezia Europe M.D.   On: 05/28/2016 13:56   Dg Chest Port 1 View  Result Date: 05/28/2016 CLINICAL DATA:  GI bleed.  Line placement. EXAM: PORTABLE CHEST 1 VIEW COMPARISON:  None. FINDINGS: Low lung volumes. Borderline cardiac enlargement. Thoracic atherosclerosis. RIGHT IJ central venous line tip SVC RA junction. No pneumothorax. Bibasilar scarring without infiltrates or significant atelectasis. No effusion. Osteopenia. IMPRESSION: Chronic changes as described. RIGHT IJ catheter tip SVC RA junction.  No pneumothorax. Electronically Signed   By: Staci Righter M.D.   On: 05/28/2016 09:20   Ir Embo Art  Lawson Fiscal Hemorr Lymph [employer] Guide Roadmapping  Result Date: 05/28/2016 CLINICAL DATA:  Lower GI Possible diverticular bleed EXAM: SELECTIVE VISCERAL ARTERIOGRAPHY ADDITIONAL ARTERIOGRAPHY IR ULTRASOUND GUIDANCE VASC ACCESS RIGHT IR EMBO ART  VEN HEMORR LYMPH EXTRAV  INC GUIDE ROADMAPPING ANESTHESIA/SEDATION: Intravenous Fentanyl and Versed were administered as conscious sedation during continuous monitoring of the patient's level of consciousness and physiological / cardiorespiratory status by the radiology RN, with a total moderate sedation time of 29 minutes. MEDICATIONS: Lidocaine 1% subcutaneous CONTRAST:  25 mL Visipaque 320 PROCEDURE: The procedure, risks (including but not limited to bleeding, infection, organ damage ), benefits, and alternatives were explained to the patient. Questions regarding the procedure were encouraged and answered. The patient understands and consents to the procedure. Right femoral region prepped and draped in usual sterile fashion. Maximal barrier sterile technique was utilized including caps, mask, sterile gowns, sterile gloves, sterile drape, hand hygiene and skin antiseptic. The right common femoral artery was localized under  ultrasound. Under real-time ultrasound guidance, the vessel was accessed with a 21-gauge micropuncture needle, exchanged over a 018 guidewire for a transitional dilator, through which a 035 guidewire was advanced. Over this, a  5 [place] vascular sheath was placed, through which a 5 [place] C2 catheter was advanced and used to selectively catheterize the superior mesenteric artery for selective arteriography . A coaxial renegade microcatheter was then advanced with an angled 018 guidewire in used to selectively catheterize second and third order mesenteric branches of the SMA. Confirmatory arteriography was performed. The catheter was advanced distally into the third order branch supplying the bleeding segment and the branch was embolized with 2 mm coils to cessation of flow, confirmed with contrast injection. The catheters and sheath were removed and hemostasis achieved with the aid of the Exoseal device after confirmatory femoral arteriography. The patient tolerated the procedure well. COMPLICATIONS: None immediate FINDINGS: Intermittent Active arterial extravasation supplied by a third order branch of the superior mesenteric artery, corresponding to region of extravasation seen on previous scintigraphy. Technically successful 2 mm coil embolization of distal mesentery branch supplying the affected segment. IMPRESSION: 1. Positive for active extravasation into the lumen of the proximal colon from a distal branch of the SMA. 2. Superselective 2 mm coil embolization of the supplying arterial branch performed without complication. Electronically Signed   By: Lucrezia Europe M.D.   On: 05/28/2016 13:56    Medications: . sodium chloride   Intravenous Once  . chlorhexidine  15 mL Mouth Rinse BID  . insulin aspart  0-9 Units Subcutaneous Q4H  . levothyroxine  125 mcg Oral QAC breakfast  . mouth rinse  15 mL Mouth Rinse q12n4p  . pantoprazole  80 mg Oral Q1200  . peg 3350 powder  1 kit Oral Once  . simvastatin  40 mg  Oral q1800   . sodium chloride 50 mL/hr at 05/30/16 0600  . sodium chloride Stopped (05/29/16 1930)    Assessment/Plan [facility]BPR/Acute lower gi bleed s/p Superior mesenteric arteriogram and superselective coil embolization of active bleed, 05/28/16, Dr. Vernard Gambles, IR Acute on chronic kidney disease dm2 gerd Hyperkalemia Anemia FEN:  IV fluids/Clears ID:  No ABx DVT:  SCD    Plan:  We will see again as needed.  Please feel free to call anytime.   LOS: 3 days    Giannah Zavadil 05/30/2016 [phone]

## 2016-05-30 NOTE — Procedures (Signed)
Placed patient on CPAP for the night.  Patient is tolerating well at this time. 

## 2016-05-30 NOTE — Progress Notes (Signed)
Pt has had two to three episodes of sleep apnea while on CPAP. Pt oxygen saturation drops as low as 64%, while on CPAP with good waveform reading, and quickly returns to 95-100% with no intervention by RN or RT. Pt oxygen desaturation episodes lasting less than 5 seconds.RN made RT aware. Will continue to monitor.

## 2016-05-30 NOTE — Progress Notes (Signed)
PROGRESS NOTE [facility]   Zachary Cook   XBM:[id number] DOB: 1932/06/04  DOA: 05/27/2016 PCP: No PCP Per Patient   Brief Narrative:  83 y/o M with PMHx of T2DM, Barrett's and diverticulosis, presented with GI bleed. subsequently developed hypotension, was admitted to ICU placed on pressor and transfused total of 3 units of PRBC's. Patient underwent coil emobolization of distal branch of SMA in prox colon on 05/28/16.   Subjective: Patient seen and examined. Have no complaints, continues to do well. Tolerating diet well. No BM since yesterday. Denies abdominal pain. Remains afebrile. H/H stable   Assessment & Plan: Hemorrhagic shock due lower GI bleed - likely to be diverticular  s/p coil embolization of distal branch of SMA in prox colon on 05/28/16 s/p 3 units of PRBC's  Bleeding seem to have stopped, off pressors. BP stable.  GI and Surgery recommendations appreciated  Advance diet as tolerated  Transfer to tele floor  If tolerate diet well and no more signs of bleeding can d/c home tomorrow   Acute on CKD III due to hypoperfusion from blood loss and hypotension - Improving  Will d/c IVF - Encourage oral intake  Monitor BMP in AM   Anemia of acute blood loss - Hgb stable  s/p 3 units PRBC's  Transfuse if Hgb < 7  Monitor CBC in AM  Will start iron supplements   DM type 2 - CBG's stable  SSI  Monitor CBG's   Hypothyroidism - Continue home levothyroxine 125 mcg day   DVT prophylaxis: SCD's  Code Status: FULL  Family Communication: Wife at bedside  Disposition Plan: Home in 24 hrs if hemoglobin stable   Consultants:   GI  IR   Gen Surgery   Procedures:   Coil embolization   Antimicrobials:  None     Objective: Vitals:   05/30/16 0400 05/30/16 0500 05/30/16 0600 05/30/16 0800  BP: (!) 148/39  (!) 158/65 (!) 150/90  Pulse:      Resp: '16 15 13   '$ Temp:      TempSrc:      SpO2: 97% 100% 96%   Weight:      Height:        Intake/Output  Summary (Last 24 hours) at 05/30/16 0830 Last data filed at 05/30/16 0600  Gross per 24 hour  Intake             1255 ml  Output             2525 ml  Net            -1270 ml   Filed Weights   05/27/16 1936 05/28/16 0300  Weight: 76.2 kg (168 lb) 79.5 kg (175 lb 4.3 oz)    Examination:  General exam: NAD Respiratory system: CTA Cardiovascular system: S1S2 no mrg Gastrointestinal system: Abd soft NTND   Central nervous system: Alert  Extremities: No LE edema  Skin: No lesions  Psychiatry: Mood appropriate    Data Reviewed: I have personally reviewed following labs and imaging studies  CBC:  Recent Labs Lab 05/27/16 1954 05/28/16 0620 05/28/16 1605 05/29/16 0304 05/29/16 0900 05/30/16 0500  WBC 10.6* 14.7* 10.5  --  9.3 7.2  NEUTROABS  --   --   --   --  5.2  --   HGB 7.7* 6.6* 7.7* 6.6* 7.7* 7.3*  HCT 24.0* 19.2* 22.2* 18.9* 22.1* 21.5*  MCV 87.6 83.8 84.1  --  84.4 86.7  PLT 202 160 178  --  148* 474*   Basic Metabolic Panel:  Recent Labs Lab 05/27/16 1954 05/28/16 0321 05/28/16 0620 05/28/16 1605 05/29/16 0304 05/29/16 0900 05/30/16 0500  NA 139 138  --  139  --  139 139  K 6.5* 6.5* 5.9* 5.7* 5.2* 5.1 4.9  CL 113* 117*  --  117*  --  115* 114*  CO2 17* 18*  --  19*  --  20* 22  GLUCOSE 292* 177*  --  172*  --  168* 123*  BUN 34* 38*  --  37*  --  35* 24*  CREATININE 2.08* 2.08*  --  1.96*  --  1.72* 1.32*  CALCIUM 8.3* 7.5*  --  7.2*  --  7.5* 7.9*  MG  --   --   --  1.3*  --  1.3*  --   PHOS  --   --   --  3.5  --   --   --    GFR: Estimated Creatinine Clearance: 44.4 mL/min (A) (by C-G formula based on SCr of 1.32 mg/dL (H)). Liver Function Tests:  Recent Labs Lab 05/27/16 1954 05/28/16 1605  AST 33 20  ALT 13* 15*  ALKPHOS 41 31*  BILITOT 1.1 0.5  PROT 5.6* 4.3*  ALBUMIN 2.6* 2.3*   No results for input(s): LIPASE, AMYLASE in the last 168 hours. No results for input(s): AMMONIA in the last 168 hours. Coagulation Profile:  Recent  Labs Lab 05/27/16 1954 05/28/16 1605  INR 1.16 1.26   Cardiac Enzymes:  Recent Labs Lab 05/28/16 1605 05/29/16 0900  TROPONINI <0.03 0.03*   BNP (last 3 results) No results for input(s): PROBNP in the last 8760 hours. HbA1C: No results for input(s): HGBA1C in the last 72 hours. CBG:  Recent Labs Lab 05/29/16 1515 05/29/16 2007 05/29/16 2309 05/30/16 0311 05/30/16 0749  GLUCAP 208* 148* 106* 120* 130*   Lipid Profile: No results for input(s): CHOL, HDL, LDLCALC, TRIG, CHOLHDL, LDLDIRECT in the last 72 hours. Thyroid Function Tests: No results for input(s): TSH, T4TOTAL, FREET4, T3FREE, THYROIDAB in the last 72 hours. Anemia Panel: No results for input(s): VITAMINB12, FOLATE, FERRITIN, TIBC, IRON, RETICCTPCT in the last 72 hours. Sepsis Labs:  Recent Labs Lab 05/28/16 1605 05/29/16 0900 05/30/16 0500  PROCALCITON 0.25 0.18 2.68  LATICACIDVEN 0.8  --   --     Recent Results (from the past 240 hour(s))  MRSA PCR Screening     Status: None   Collection Time: 05/28/16  2:59 AM  Result Value Ref Range Status   MRSA by PCR NEGATIVE NEGATIVE Final    Comment:        The GeneXpert MRSA Assay (FDA approved for NASAL specimens only), is one component of a comprehensive MRSA colonization surveillance program. It is not intended to diagnose MRSA infection nor to guide or monitor treatment for MRSA infections.   Culture, blood (Routine X 2) w Reflex to ID Panel     Status: None (Preliminary result)   Collection Time: 05/28/16  4:16 PM  Result Value Ref Range Status   Specimen Description BLOOD BLOOD RIGHT ARM  Final   Special Requests BOTTLES DRAWN AEROBIC AND ANAEROBIC 7 CC EA  Final   Culture   Final    NO GROWTH < 24 HOURS Performed at [facility], [place] [address] [place], [place] [address]    Report Status PENDING  Incomplete  Culture, blood (Routine X 2) w Reflex to ID Panel     Status:  None (Preliminary result)   Collection Time: 05/28/16  4:26  PM  Result Value Ref Range Status   Specimen Description BLOOD BLOOD LEFT HAND  Final   Special Requests BOTTLES DRAWN AEROBIC AND ANAEROBIC 5.5 CC EA  Final   Culture   Final    NO GROWTH < 24 HOURS Performed at [facility], [place] [address] [place], [place] [address]    Report Status PENDING  Incomplete     Radiology Studies: Ir Angiogram Visceral Selective  Result Date: 05/28/2016 CLINICAL DATA:  Lower GI Possible diverticular bleed EXAM: SELECTIVE VISCERAL ARTERIOGRAPHY ADDITIONAL ARTERIOGRAPHY IR ULTRASOUND GUIDANCE VASC ACCESS RIGHT IR EMBO ART  VEN HEMORR LYMPH EXTRAV  INC GUIDE ROADMAPPING ANESTHESIA/SEDATION: Intravenous Fentanyl and Versed were administered as conscious sedation during continuous monitoring of the patient's level of consciousness and physiological / cardiorespiratory status by the radiology RN, with a total moderate sedation time of 29 minutes. MEDICATIONS: Lidocaine 1% subcutaneous CONTRAST:  25 mL Visipaque 320 PROCEDURE: The procedure, risks (including but not limited to bleeding, infection, organ damage ), benefits, and alternatives were explained to the patient. Questions regarding the procedure were encouraged and answered. The patient understands and consents to the procedure. Right femoral region prepped and draped in usual sterile fashion. Maximal barrier sterile technique was utilized including caps, mask, sterile gowns, sterile gloves, sterile drape, hand hygiene and skin antiseptic. The right common femoral artery was localized under ultrasound. Under real-time ultrasound guidance, the vessel was accessed with a 21-gauge micropuncture needle, exchanged over a 018 guidewire for a transitional dilator, through which a 035 guidewire was advanced. Over this, a 5 [place] vascular sheath was placed, through which a 5 [place] C2 catheter was advanced and used to selectively catheterize the superior mesenteric artery for selective arteriography . A coaxial renegade  microcatheter was then advanced with an angled 018 guidewire in used to selectively catheterize second and third order mesenteric branches of the SMA. Confirmatory arteriography was performed. The catheter was advanced distally into the third order branch supplying the bleeding segment and the branch was embolized with 2 mm coils to cessation of flow, confirmed with contrast injection. The catheters and sheath were removed and hemostasis achieved with the aid of the Exoseal device after confirmatory femoral arteriography. The patient tolerated the procedure well. COMPLICATIONS: None immediate FINDINGS: Intermittent Active arterial extravasation supplied by a third order branch of the superior mesenteric artery, corresponding to region of extravasation seen on previous scintigraphy. Technically successful 2 mm coil embolization of distal mesentery branch supplying the affected segment. IMPRESSION: 1. Positive for active extravasation into the lumen of the proximal colon from a distal branch of the SMA. 2. Superselective 2 mm coil embolization of the supplying arterial branch performed without complication. Electronically Signed   By: Lucrezia Europe M.D.   On: 05/28/2016 13:56   Ir Angiogram Selective Each Additional Vessel  Result Date: 05/28/2016 CLINICAL DATA:  Lower GI Possible diverticular bleed EXAM: SELECTIVE VISCERAL ARTERIOGRAPHY ADDITIONAL ARTERIOGRAPHY IR ULTRASOUND GUIDANCE VASC ACCESS RIGHT IR EMBO ART  VEN HEMORR LYMPH EXTRAV  INC GUIDE ROADMAPPING ANESTHESIA/SEDATION: Intravenous Fentanyl and Versed were administered as conscious sedation during continuous monitoring of the patient's level of consciousness and physiological / cardiorespiratory status by the radiology RN, with a total moderate sedation time of 29 minutes. MEDICATIONS: Lidocaine 1% subcutaneous CONTRAST:  25 mL Visipaque 320 PROCEDURE: The procedure, risks (including but not limited to bleeding, infection, organ damage ), benefits, and  alternatives were explained to the patient. Questions  regarding the procedure were encouraged and answered. The patient understands and consents to the procedure. Right femoral region prepped and draped in usual sterile fashion. Maximal barrier sterile technique was utilized including caps, mask, sterile gowns, sterile gloves, sterile drape, hand hygiene and skin antiseptic. The right common femoral artery was localized under ultrasound. Under real-time ultrasound guidance, the vessel was accessed with a 21-gauge micropuncture needle, exchanged over a 018 guidewire for a transitional dilator, through which a 035 guidewire was advanced. Over this, a 5 [place] vascular sheath was placed, through which a 5 [place] C2 catheter was advanced and used to selectively catheterize the superior mesenteric artery for selective arteriography . A coaxial renegade microcatheter was then advanced with an angled 018 guidewire in used to selectively catheterize second and third order mesenteric branches of the SMA. Confirmatory arteriography was performed. The catheter was advanced distally into the third order branch supplying the bleeding segment and the branch was embolized with 2 mm coils to cessation of flow, confirmed with contrast injection. The catheters and sheath were removed and hemostasis achieved with the aid of the Exoseal device after confirmatory femoral arteriography. The patient tolerated the procedure well. COMPLICATIONS: None immediate FINDINGS: Intermittent Active arterial extravasation supplied by a third order branch of the superior mesenteric artery, corresponding to region of extravasation seen on previous scintigraphy. Technically successful 2 mm coil embolization of distal mesentery branch supplying the affected segment. IMPRESSION: 1. Positive for active extravasation into the lumen of the proximal colon from a distal branch of the SMA. 2. Superselective 2 mm coil embolization of the supplying arterial  branch performed without complication. Electronically Signed   By: Lucrezia Europe M.D.   On: 05/28/2016 13:56   Ir US Guide Vasc Access Right  Result Date: 05/28/2016 CLINICAL DATA:  Lower GI Possible diverticular bleed EXAM: SELECTIVE VISCERAL ARTERIOGRAPHY ADDITIONAL ARTERIOGRAPHY IR ULTRASOUND GUIDANCE VASC ACCESS RIGHT IR EMBO ART  VEN HEMORR LYMPH EXTRAV  INC GUIDE ROADMAPPING ANESTHESIA/SEDATION: Intravenous Fentanyl and Versed were administered as conscious sedation during continuous monitoring of the patient's level of consciousness and physiological / cardiorespiratory status by the radiology RN, with a total moderate sedation time of 29 minutes. MEDICATIONS: Lidocaine 1% subcutaneous CONTRAST:  25 mL Visipaque 320 PROCEDURE: The procedure, risks (including but not limited to bleeding, infection, organ damage ), benefits, and alternatives were explained to the patient. Questions regarding the procedure were encouraged and answered. The patient understands and consents to the procedure. Right femoral region prepped and draped in usual sterile fashion. Maximal barrier sterile technique was utilized including caps, mask, sterile gowns, sterile gloves, sterile drape, hand hygiene and skin antiseptic. The right common femoral artery was localized under ultrasound. Under real-time ultrasound guidance, the vessel was accessed with a 21-gauge micropuncture needle, exchanged over a 018 guidewire for a transitional dilator, through which a 035 guidewire was advanced. Over this, a 5 [place] vascular sheath was placed, through which a 5 [place] C2 catheter was advanced and used to selectively catheterize the superior mesenteric artery for selective arteriography . A coaxial renegade microcatheter was then advanced with an angled 018 guidewire in used to selectively catheterize second and third order mesenteric branches of the SMA. Confirmatory arteriography was performed. The catheter was advanced distally into the third  order branch supplying the bleeding segment and the branch was embolized with 2 mm coils to cessation of flow, confirmed with contrast injection. The catheters and sheath were removed and hemostasis achieved with the aid of the Exoseal device after confirmatory  femoral arteriography. The patient tolerated the procedure well. COMPLICATIONS: None immediate FINDINGS: Intermittent Active arterial extravasation supplied by a third order branch of the superior mesenteric artery, corresponding to region of extravasation seen on previous scintigraphy. Technically successful 2 mm coil embolization of distal mesentery branch supplying the affected segment. IMPRESSION: 1. Positive for active extravasation into the lumen of the proximal colon from a distal branch of the SMA. 2. Superselective 2 mm coil embolization of the supplying arterial branch performed without complication. Electronically Signed   By: Lucrezia Europe M.D.   On: 05/28/2016 13:56   Dg Chest Port 1 View  Result Date: 05/28/2016 CLINICAL DATA:  GI bleed.  Line placement. EXAM: PORTABLE CHEST 1 VIEW COMPARISON:  None. FINDINGS: Low lung volumes. Borderline cardiac enlargement. Thoracic atherosclerosis. RIGHT IJ central venous line tip SVC RA junction. No pneumothorax. Bibasilar scarring without infiltrates or significant atelectasis. No effusion. Osteopenia. IMPRESSION: Chronic changes as described. RIGHT IJ catheter tip SVC RA junction.  No pneumothorax. Electronically Signed   By: Staci Righter M.D.   On: 05/28/2016 09:20   Ir Embo Art  Lawson Fiscal Hemorr Lymph [employer] Guide Roadmapping  Result Date: 05/28/2016 CLINICAL DATA:  Lower GI Possible diverticular bleed EXAM: SELECTIVE VISCERAL ARTERIOGRAPHY ADDITIONAL ARTERIOGRAPHY IR ULTRASOUND GUIDANCE VASC ACCESS RIGHT IR EMBO ART  VEN HEMORR LYMPH EXTRAV  INC GUIDE ROADMAPPING ANESTHESIA/SEDATION: Intravenous Fentanyl and Versed were administered as conscious sedation during continuous monitoring of the  patient's level of consciousness and physiological / cardiorespiratory status by the radiology RN, with a total moderate sedation time of 29 minutes. MEDICATIONS: Lidocaine 1% subcutaneous CONTRAST:  25 mL Visipaque 320 PROCEDURE: The procedure, risks (including but not limited to bleeding, infection, organ damage ), benefits, and alternatives were explained to the patient. Questions regarding the procedure were encouraged and answered. The patient understands and consents to the procedure. Right femoral region prepped and draped in usual sterile fashion. Maximal barrier sterile technique was utilized including caps, mask, sterile gowns, sterile gloves, sterile drape, hand hygiene and skin antiseptic. The right common femoral artery was localized under ultrasound. Under real-time ultrasound guidance, the vessel was accessed with a 21-gauge micropuncture needle, exchanged over a 018 guidewire for a transitional dilator, through which a 035 guidewire was advanced. Over this, a 5 [place] vascular sheath was placed, through which a 5 [place] C2 catheter was advanced and used to selectively catheterize the superior mesenteric artery for selective arteriography . A coaxial renegade microcatheter was then advanced with an angled 018 guidewire in used to selectively catheterize second and third order mesenteric branches of the SMA. Confirmatory arteriography was performed. The catheter was advanced distally into the third order branch supplying the bleeding segment and the branch was embolized with 2 mm coils to cessation of flow, confirmed with contrast injection. The catheters and sheath were removed and hemostasis achieved with the aid of the Exoseal device after confirmatory femoral arteriography. The patient tolerated the procedure well. COMPLICATIONS: None immediate FINDINGS: Intermittent Active arterial extravasation supplied by a third order branch of the superior mesenteric artery, corresponding to region of  extravasation seen on previous scintigraphy. Technically successful 2 mm coil embolization of distal mesentery branch supplying the affected segment. IMPRESSION: 1. Positive for active extravasation into the lumen of the proximal colon from a distal branch of the SMA. 2. Superselective 2 mm coil embolization of the supplying arterial branch performed without complication. Electronically Signed   By: Lucrezia Europe M.D.   On: 05/28/2016 13:56  Scheduled Meds: . sodium chloride   Intravenous Once  . chlorhexidine  15 mL Mouth Rinse BID  . insulin aspart  0-9 Units Subcutaneous Q4H  . levothyroxine  125 mcg Oral QAC breakfast  . mouth rinse  15 mL Mouth Rinse q12n4p  . pantoprazole  80 mg Oral Q1200  . peg 3350 powder  1 kit Oral Once  . simvastatin  40 mg Oral q1800   Continuous Infusions: . sodium chloride 50 mL/hr at 05/30/16 0600  . sodium chloride Stopped (05/29/16 1930)     LOS: 3 days    Chipper Oman, MD Pager: Text Page via [email or web address]  [phone]  If 7PM-7AM, please contact night-coverage [email or web address] Password [email or web address] 05/30/2016, 8:30 AM

## 2016-05-31 LAB — CBC WITH DIFFERENTIAL/PLATELET
BASOS ABS: 0 10*3/uL (ref 0.0–0.1)
Basophils Relative: 1 %
Eosinophils Absolute: 0.3 10*3/uL (ref 0.0–0.7)
Eosinophils Relative: 4 %
HEMATOCRIT: 23.5 % — AB (ref 39.0–52.0)
Hemoglobin: 7.9 g/dL — ABNORMAL LOW (ref 13.0–17.0)
LYMPHS ABS: 2.1 10*3/uL (ref 0.7–4.0)
LYMPHS PCT: 33 %
MCH: 29.8 pg (ref 26.0–34.0)
MCHC: 33.6 g/dL (ref 30.0–36.0)
MCV: 88.7 fL (ref 78.0–100.0)
MONO ABS: 0.4 10*3/uL (ref 0.1–1.0)
MONOS PCT: 6 %
NEUTROS ABS: 3.7 10*3/uL (ref 1.7–7.7)
Neutrophils Relative %: 56 %
Platelets: 134 10*3/uL — ABNORMAL LOW (ref 150–400)
RBC: 2.65 MIL/uL — ABNORMAL LOW (ref 4.22–5.81)
RDW: 16 % — AB (ref 11.5–15.5)
WBC: 6.6 10*3/uL (ref 4.0–10.5)

## 2016-05-31 LAB — BASIC METABOLIC PANEL
ANION GAP: 4 — AB (ref 5–15)
BUN: 21 mg/dL — ABNORMAL HIGH (ref 6–20)
CALCIUM: 8.4 mg/dL — AB (ref 8.9–10.3)
CO2: 23 mmol/L (ref 22–32)
Chloride: 113 mmol/L — ABNORMAL HIGH (ref 101–111)
Creatinine, Ser: 1.43 mg/dL — ABNORMAL HIGH (ref 0.61–1.24)
GFR calc Af Amer: 51 mL/min — ABNORMAL LOW (ref >60)
GFR calc non Af Amer: 44 mL/min — ABNORMAL LOW (ref >60)
Glucose, Bld: 133 mg/dL — ABNORMAL HIGH (ref 65–99)
Potassium: 4.7 mmol/L (ref 3.5–5.1)
Sodium: 140 mmol/L (ref 135–145)

## 2016-05-31 LAB — GLUCOSE, CAPILLARY
GLUCOSE-CAPILLARY: 117 mg/dL — AB (ref 65–99)
GLUCOSE-CAPILLARY: 131 mg/dL — AB (ref 65–99)
GLUCOSE-CAPILLARY: 132 mg/dL — AB (ref 65–99)

## 2016-05-31 MED ORDER — FERROUS SULFATE 325 (65 FE) MG PO TABS: 325.0000 mg | ORAL_TABLET | Freq: Three times a day (TID) | ORAL | 0 refills | Status: DC

## 2016-05-31 NOTE — Progress Notes (Signed)
Went over d/c instructions with patient.  He verbalized understanding.  Left hospital with spouse.

## 2016-05-31 NOTE — Progress Notes (Signed)
Date: May 31, 2016 Discharge orders review for case management needs.  None found Velva Harman, BSN, [place], [place]: [phone]

## 2016-05-31 NOTE — Care Management Important Message (Signed)
Important Message  Patient Details  Name: Zachary Cook MRN: [id number] Date of Birth: Sep 29, 1932   Medicare Important Message Given:  Yes    Kerin Salen 05/31/2016, 11:44 AMImportant Message  Patient Details  Name: Zachary Cook MRN: [id number] Date of Birth: [birth year]   Medicare Important Message Given:  Yes    Kerin Salen 05/31/2016, 11:44 AM

## 2016-05-31 NOTE — Discharge Summary (Signed)
Physician Discharge Summary  Zachary Cook  RSW:[id number]  DOB: [birth year]  DOA: 05/27/2016 PCP: No PCP Per Patient  Admit date: 05/27/2016 Discharge date: 05/31/2016  Admitted From: Home  Disposition:  Home   Recommendations for Outpatient Follow-up:  1. Follow up with PCP in 1-2 weeks 2. Please obtain BMP/CBC in one week to monitor hgb and renal  3. Follow up with GI in 2-3 weeks     Discharge Condition: Stable  CODE STATUS: FULL  Diet recommendation: Heart Healthy / Low fiber / Soft   Brief/Interim Summary: 83 y/o M with PMHx of T2DM, Barrett's and diverticulosis, presented with GI bleed. subsequently developed hypotension, was admitted to ICU placed on pressor and transfused total of 3 units of PRBC's. Patient underwent coil emobolization of distal branch of SMA in prox colon on 05/28/16. Subsequently monitored for 72 hours, patient had no more bleeding and hgb remained stable since embolization. Patient tolerating diet well and will be discharge home with PCP follow up.   Subjective: Patient seen and examined with daughter at bedside. Have no complaints, no more BM with blood. Tolerating diet well. Remains afebrile. Ambulating with no problems.   Discharge Diagnoses/Hospital Course:  Hemorrhagic shock due lower GI bleed - likely to be diverticular  Initially treated with pressors, IVF and blood transfusion which responded well s/p coil embolization of distal branch of SMA in prox colon on 05/28/16 s/p 3 units of PRBC's  Bleeding seem to have stopped, off pressors. BP stable.  GI and Surgery were consulted no surgical intervention was needed  Patient tolerated diet well.   Acute on CKD III due to hypoperfusion from blood loss and hypotension - Improved Treated with IVF Encourage oral intake  Monitor Cr in 1 week   Anemia of acute blood loss - Hgb stable  s/p 3 units PRBC's  Monitor CBC in 1 week  Continue iron supplements   DM type 2 - CBG's stable during hospital  stay  Resume home dose insuline  Continue Metformin and Victoza Monitor FS goal < 140 fasting  Follow up with PMD   Hypothyroidism - Continue home levothyroxine 125 mcg day   All other chronic medical condition were stable during the hospitalization.  On the day of the discharge the patient's vitals were stable, and no other acute medical condition were reported by patient. Patient was felt safe to be discharged to home.   Discharge Instructions  You were cared for by a hospitalist during your hospital stay. If you have any questions about your discharge medications or the care you received while you were in the hospital after you are discharged, you can call the unit and asked to speak with the hospitalist on call if the hospitalist that took care of you is not available. Once you are discharged, your primary care physician will handle any further medical issues. Please note that NO REFILLS for any discharge medications will be authorized once you are discharged, as it is imperative that you return to your primary care physician (or establish a relationship with a primary care physician if you do not have one) for your aftercare needs so that they can reassess your need for medications and monitor your lab values.  Discharge Instructions    Call MD for:  difficulty breathing, headache or visual disturbances    Complete by:  As directed    Call MD for:  extreme fatigue    Complete by:  As directed    Call MD for:  hives  Complete by:  As directed    Call MD for:  persistant dizziness or light-headedness    Complete by:  As directed    Call MD for:  persistant nausea and vomiting    Complete by:  As directed    Call MD for:  redness, tenderness, or signs of infection (pain, swelling, redness, odor or green/yellow discharge around incision site)    Complete by:  As directed    Call MD for:  severe uncontrolled pain    Complete by:  As directed    Call MD for:  temperature >100.4     Complete by:  As directed    Diet - low sodium heart healthy    Complete by:  As directed    Increase activity slowly    Complete by:  As directed      Allergies as of 05/31/2016   No Known Allergies     Medication List    STOP taking these medications   naproxen sodium 220 MG tablet Commonly known as:  ANAPROX     TAKE these medications   ACCU-CHEK NANO SMARTVIEW w/Device Kit   aspirin 81 MG tablet Take 81 mg by mouth daily.   B-D UF III MINI PEN NEEDLES 31G X 5 MM Misc Generic drug:  Insulin Pen Needle USE ONE PER DAY WITH VICTOZA   esomeprazole 40 MG capsule Commonly known as:  NEXIUM TAKE ONE CAPSULE BY MOUTH DAILY AT NOON   ferrous sulfate 325 (65 FE) MG tablet Take 1 tablet (325 mg total) by mouth 3 (three) times daily with meals.   FLUAD 0.5 ML Susy Generic drug:  Influenza Vac A&B Surf Ant Adj   glimepiride 2 MG tablet Commonly known as:  AMARYL TAKE 2 TABLETS(4 MG) BY MOUTH DAILY BEFORE DINNER What changed:  See the new instructions.   glucose blood test strip Commonly known as:  ACCU-CHEK SMARTVIEW Test blood sugar twice daily   Insulin Glargine 300 UNIT/ML Sopn Commonly known as:  TOUJEO SOLOSTAR Inject 12 Units into the skin daily.   levothyroxine 125 MCG tablet Commonly known as:  SYNTHROID, LEVOTHROID Take 1 tablet (125 mcg total) by mouth daily before breakfast.   liraglutide 18 MG/3ML Sopn Commonly known as:  VICTOZA ADMINISTER 1.2 MG UNDER THE SKIN DAILY   metFORMIN 750 MG 24 hr tablet Commonly known as:  GLUCOPHAGE-XR TAKE 2 TABLETS(1500 MG) BY MOUTH DAILY WITH BREAKFAST   simvastatin 40 MG tablet Commonly known as:  ZOCOR TAKE 1 TABLET(40 MG) BY MOUTH AT BEDTIME   tamsulosin 0.4 MG Caps capsule Commonly known as:  FLOMAX TAKE 1 CAPSULE BY MOUTH EVERY DAY      Follow-up Information    PCP. Schedule an appointment as soon as possible for a visit in 1 week(s).        GI Follow up.   Why:  Patient has an appointment in 2  weeks          No Known Allergies  Consultations:  GI  Gen Surgery   IR    Procedures/Studies: Nm Gi Blood Loss  Result Date: 05/28/2016 CLINICAL DATA:  83 year old male with bloody stools. EXAM: NUCLEAR MEDICINE GASTROINTESTINAL BLEEDING SCAN TECHNIQUE: Sequential abdominal images were obtained following intravenous administration of Tc-69mlabeled red blood cells. RADIOPHARMACEUTICALS:  21.1 mCi Tc-918mn-vitro labeled red cells. COMPARISON:  None. FINDINGS: There is physiologic uptake in the liver spleen and cardiac blood pool as well as uptake within the aorta and IVC. There is radiotracer activity  in the right upper abdomen conforming to the location of the hepatic flexure and proximal transverse colon consistent with active GI bleed. IMPRESSION: Active GI bleed in the hepatic flexure/ proximal transverse colon. These results were called by telephone at the time of interpretation on 05/28/2016 at 2:36 am to Dr. Jennette Kettle , who verbally acknowledged these results. Electronically Signed   By: Anner Crete M.D.   On: 05/28/2016 02:58   Ir Angiogram Visceral Selective  Result Date: 05/28/2016 CLINICAL DATA:  Lower GI Possible diverticular bleed EXAM: SELECTIVE VISCERAL ARTERIOGRAPHY ADDITIONAL ARTERIOGRAPHY IR ULTRASOUND GUIDANCE VASC ACCESS RIGHT IR EMBO ART  VEN HEMORR LYMPH EXTRAV  INC GUIDE ROADMAPPING ANESTHESIA/SEDATION: Intravenous Fentanyl and Versed were administered as conscious sedation during continuous monitoring of the patient's level of consciousness and physiological / cardiorespiratory status by the radiology RN, with a total moderate sedation time of 29 minutes. MEDICATIONS: Lidocaine 1% subcutaneous CONTRAST:  25 mL Visipaque 320 PROCEDURE: The procedure, risks (including but not limited to bleeding, infection, organ damage ), benefits, and alternatives were explained to the patient. Questions regarding the procedure were encouraged and answered. The patient  understands and consents to the procedure. Right femoral region prepped and draped in usual sterile fashion. Maximal barrier sterile technique was utilized including caps, mask, sterile gowns, sterile gloves, sterile drape, hand hygiene and skin antiseptic. The right common femoral artery was localized under ultrasound. Under real-time ultrasound guidance, the vessel was accessed with a 21-gauge micropuncture needle, exchanged over a 018 guidewire for a transitional dilator, through which a 035 guidewire was advanced. Over this, a 5 [place] vascular sheath was placed, through which a 5 [place] C2 catheter was advanced and used to selectively catheterize the superior mesenteric artery for selective arteriography . A coaxial renegade microcatheter was then advanced with an angled 018 guidewire in used to selectively catheterize second and third order mesenteric branches of the SMA. Confirmatory arteriography was performed. The catheter was advanced distally into the third order branch supplying the bleeding segment and the branch was embolized with 2 mm coils to cessation of flow, confirmed with contrast injection. The catheters and sheath were removed and hemostasis achieved with the aid of the Exoseal device after confirmatory femoral arteriography. The patient tolerated the procedure well. COMPLICATIONS: None immediate FINDINGS: Intermittent Active arterial extravasation supplied by a third order branch of the superior mesenteric artery, corresponding to region of extravasation seen on previous scintigraphy. Technically successful 2 mm coil embolization of distal mesentery branch supplying the affected segment. IMPRESSION: 1. Positive for active extravasation into the lumen of the proximal colon from a distal branch of the SMA. 2. Superselective 2 mm coil embolization of the supplying arterial branch performed without complication. Electronically Signed   By: Lucrezia Europe M.D.   On: 05/28/2016 13:56   Ir Angiogram  Visceral Selective  Result Date: 05/28/2016 INDICATION: Acute lower gastrointestinal tract bleeding with positive nuclear medicine bleeding scan localizing to the region of the hepatic flexure/ proximal transverse colon. EXAM: 1. ULTRASOUND GUIDANCE FOR VASCULAR ACCESS OF THE RIGHT COMMON FEMORAL ARTERY 2. SELECTIVE VISCERAL ARTERIOGRAPHY OF THE SUPERIOR MESENTERIC ARTERY 3. ADDITIONAL SELECTIVE ARTERIOGRAPHY OF THE SUPERIOR MESENTERIC ARTERY MEDICATIONS: None ANESTHESIA/SEDATION: Moderate (conscious) sedation was employed during this procedure. A total of Versed 1.0 mg and Fentanyl 25 mcg was administered intravenously. Moderate Sedation Time: 40 minutes. The patient's level of consciousness and vital signs were monitored continuously by radiology nursing throughout the procedure under my direct supervision. CONTRAST:  78 mL Isovue-300 FLUOROSCOPY TIME:  Fluoroscopy Time: 7 minutes and 30 seconds. 595 mGy. COMPLICATIONS: None immediate. PROCEDURE: Informed consent was obtained from the patient following explanation of the procedure, risks, benefits and alternatives. The patient understands, agrees and consents for the procedure. All questions were addressed. A time out was performed prior to the initiation of the procedure. Maximal barrier sterile technique utilized including caps, mask, sterile gowns, sterile gloves, large sterile drape, hand hygiene, and Betadine prep. Ultrasound was used to confirm patency of the right common femoral artery. Under ultrasound guidance, access of the artery was performed with a 21 gauge needle and micropuncture set. Ultrasound image documentation was performed. A 5 French vascular sheath was placed. A 5 French Cobra catheter was then advanced into the abdominal aorta an used to selectively catheterize the superior mesenteric artery. Selective arteriography was performed. The catheter was then further advanced passed a pancreaticoduodenal trunk and additional selective  arteriography performed in 2 different projections. The Cobra catheter and additional Sos catheter were used to attempt catheterization of the inferior mesenteric artery. Catheters were then removed. Oblique contrast injection was performed at the level of the femoral arteriotomy. Arteriotomy closure was performed with the Cordis Exoseal device. FINDINGS: SMA arteriography initially suggested potentially focal contrast accumulation along the medial aspect of the cecum in a rounded configuration. This was investigated under real-time fluoroscopy and did not show definitive contrast accumulation and was felt to relate to bowel gas. There also was no transit of density into the lumen of the adjacent cecum. No vascular malformations are identified. The IMA origin could not be located or selectively catheterized. IMPRESSION: No definitive active arterial bleeding identified in the SMA distribution. Focal area of density along the medial aspect of the cecum during SMA arteriography did not show persistence with fluoroscopy or transit in the bowel lumen and was felt to most likely relate to bowel gas rather than contrast extravasation. Electronically Signed   By: Aletta Edouard M.D.   On: 05/28/2016 08:35   Ir Angiogram Selective Each Additional Vessel  Result Date: 05/28/2016 CLINICAL DATA:  Lower GI Possible diverticular bleed EXAM: SELECTIVE VISCERAL ARTERIOGRAPHY ADDITIONAL ARTERIOGRAPHY IR ULTRASOUND GUIDANCE VASC ACCESS RIGHT IR EMBO ART  VEN HEMORR LYMPH EXTRAV  INC GUIDE ROADMAPPING ANESTHESIA/SEDATION: Intravenous Fentanyl and Versed were administered as conscious sedation during continuous monitoring of the patient's level of consciousness and physiological / cardiorespiratory status by the radiology RN, with a total moderate sedation time of 29 minutes. MEDICATIONS: Lidocaine 1% subcutaneous CONTRAST:  25 mL Visipaque 320 PROCEDURE: The procedure, risks (including but not limited to bleeding, infection,  organ damage ), benefits, and alternatives were explained to the patient. Questions regarding the procedure were encouraged and answered. The patient understands and consents to the procedure. Right femoral region prepped and draped in usual sterile fashion. Maximal barrier sterile technique was utilized including caps, mask, sterile gowns, sterile gloves, sterile drape, hand hygiene and skin antiseptic. The right common femoral artery was localized under ultrasound. Under real-time ultrasound guidance, the vessel was accessed with a 21-gauge micropuncture needle, exchanged over a 018 guidewire for a transitional dilator, through which a 035 guidewire was advanced. Over this, a 5 [place] vascular sheath was placed, through which a 5 [place] C2 catheter was advanced and used to selectively catheterize the superior mesenteric artery for selective arteriography . A coaxial renegade microcatheter was then advanced with an angled 018 guidewire in used to selectively catheterize second and third order mesenteric branches of the SMA. Confirmatory arteriography was performed. The catheter was advanced  distally into the third order branch supplying the bleeding segment and the branch was embolized with 2 mm coils to cessation of flow, confirmed with contrast injection. The catheters and sheath were removed and hemostasis achieved with the aid of the Exoseal device after confirmatory femoral arteriography. The patient tolerated the procedure well. COMPLICATIONS: None immediate FINDINGS: Intermittent Active arterial extravasation supplied by a third order branch of the superior mesenteric artery, corresponding to region of extravasation seen on previous scintigraphy. Technically successful 2 mm coil embolization of distal mesentery branch supplying the affected segment. IMPRESSION: 1. Positive for active extravasation into the lumen of the proximal colon from a distal branch of the SMA. 2. Superselective 2 mm coil embolization  of the supplying arterial branch performed without complication. Electronically Signed   By: Corlis Leak M.D.   On: 05/28/2016 13:56   Ir Angiogram Selective Each Additional Vessel  Result Date: 05/28/2016 INDICATION: Acute lower gastrointestinal tract bleeding with positive nuclear medicine bleeding scan localizing to the region of the hepatic flexure/ proximal transverse colon. EXAM: 1. ULTRASOUND GUIDANCE FOR VASCULAR ACCESS OF THE RIGHT COMMON FEMORAL ARTERY 2. SELECTIVE VISCERAL ARTERIOGRAPHY OF THE SUPERIOR MESENTERIC ARTERY 3. ADDITIONAL SELECTIVE ARTERIOGRAPHY OF THE SUPERIOR MESENTERIC ARTERY MEDICATIONS: None ANESTHESIA/SEDATION: Moderate (conscious) sedation was employed during this procedure. A total of Versed 1.0 mg and Fentanyl 25 mcg was administered intravenously. Moderate Sedation Time: 40 minutes. The patient's level of consciousness and vital signs were monitored continuously by radiology nursing throughout the procedure under my direct supervision. CONTRAST:  78 mL Isovue-300 FLUOROSCOPY TIME:  Fluoroscopy Time: 7 minutes and 30 seconds. 595 mGy. COMPLICATIONS: None immediate. PROCEDURE: Informed consent was obtained from the patient following explanation of the procedure, risks, benefits and alternatives. The patient understands, agrees and consents for the procedure. All questions were addressed. A time out was performed prior to the initiation of the procedure. Maximal barrier sterile technique utilized including caps, mask, sterile gowns, sterile gloves, large sterile drape, hand hygiene, and Betadine prep. Ultrasound was used to confirm patency of the right common femoral artery. Under ultrasound guidance, access of the artery was performed with a 21 gauge needle and micropuncture set. Ultrasound image documentation was performed. A 5 French vascular sheath was placed. A 5 French Cobra catheter was then advanced into the abdominal aorta an used to selectively catheterize the superior  mesenteric artery. Selective arteriography was performed. The catheter was then further advanced passed a pancreaticoduodenal trunk and additional selective arteriography performed in 2 different projections. The Cobra catheter and additional Sos catheter were used to attempt catheterization of the inferior mesenteric artery. Catheters were then removed. Oblique contrast injection was performed at the level of the femoral arteriotomy. Arteriotomy closure was performed with the Cordis Exoseal device. FINDINGS: SMA arteriography initially suggested potentially focal contrast accumulation along the medial aspect of the cecum in a rounded configuration. This was investigated under real-time fluoroscopy and did not show definitive contrast accumulation and was felt to relate to bowel gas. There also was no transit of density into the lumen of the adjacent cecum. No vascular malformations are identified. The IMA origin could not be located or selectively catheterized. IMPRESSION: No definitive active arterial bleeding identified in the SMA distribution. Focal area of density along the medial aspect of the cecum during SMA arteriography did not show persistence with fluoroscopy or transit in the bowel lumen and was felt to most likely relate to bowel gas rather than contrast extravasation. Electronically Signed   By: Rudene Anda.D.  On: 05/28/2016 08:35   Ir US Guide Vasc Access Right  Result Date: 05/28/2016 CLINICAL DATA:  Lower GI Possible diverticular bleed EXAM: SELECTIVE VISCERAL ARTERIOGRAPHY ADDITIONAL ARTERIOGRAPHY IR ULTRASOUND GUIDANCE VASC ACCESS RIGHT IR EMBO ART  VEN HEMORR LYMPH EXTRAV  INC GUIDE ROADMAPPING ANESTHESIA/SEDATION: Intravenous Fentanyl and Versed were administered as conscious sedation during continuous monitoring of the patient's level of consciousness and physiological / cardiorespiratory status by the radiology RN, with a total moderate sedation time of 29 minutes. MEDICATIONS:  Lidocaine 1% subcutaneous CONTRAST:  25 mL Visipaque 320 PROCEDURE: The procedure, risks (including but not limited to bleeding, infection, organ damage ), benefits, and alternatives were explained to the patient. Questions regarding the procedure were encouraged and answered. The patient understands and consents to the procedure. Right femoral region prepped and draped in usual sterile fashion. Maximal barrier sterile technique was utilized including caps, mask, sterile gowns, sterile gloves, sterile drape, hand hygiene and skin antiseptic. The right common femoral artery was localized under ultrasound. Under real-time ultrasound guidance, the vessel was accessed with a 21-gauge micropuncture needle, exchanged over a 018 guidewire for a transitional dilator, through which a 035 guidewire was advanced. Over this, a 5 [place] vascular sheath was placed, through which a 5 [place] C2 catheter was advanced and used to selectively catheterize the superior mesenteric artery for selective arteriography . A coaxial renegade microcatheter was then advanced with an angled 018 guidewire in used to selectively catheterize second and third order mesenteric branches of the SMA. Confirmatory arteriography was performed. The catheter was advanced distally into the third order branch supplying the bleeding segment and the branch was embolized with 2 mm coils to cessation of flow, confirmed with contrast injection. The catheters and sheath were removed and hemostasis achieved with the aid of the Exoseal device after confirmatory femoral arteriography. The patient tolerated the procedure well. COMPLICATIONS: None immediate FINDINGS: Intermittent Active arterial extravasation supplied by a third order branch of the superior mesenteric artery, corresponding to region of extravasation seen on previous scintigraphy. Technically successful 2 mm coil embolization of distal mesentery branch supplying the affected segment. IMPRESSION: 1.  Positive for active extravasation into the lumen of the proximal colon from a distal branch of the SMA. 2. Superselective 2 mm coil embolization of the supplying arterial branch performed without complication. Electronically Signed   By: Lucrezia Europe M.D.   On: 05/28/2016 13:56   Ir US Guide Vasc Access Right  Result Date: 05/28/2016 INDICATION: Acute lower gastrointestinal tract bleeding with positive nuclear medicine bleeding scan localizing to the region of the hepatic flexure/ proximal transverse colon. EXAM: 1. ULTRASOUND GUIDANCE FOR VASCULAR ACCESS OF THE RIGHT COMMON FEMORAL ARTERY 2. SELECTIVE VISCERAL ARTERIOGRAPHY OF THE SUPERIOR MESENTERIC ARTERY 3. ADDITIONAL SELECTIVE ARTERIOGRAPHY OF THE SUPERIOR MESENTERIC ARTERY MEDICATIONS: None ANESTHESIA/SEDATION: Moderate (conscious) sedation was employed during this procedure. A total of Versed 1.0 mg and Fentanyl 25 mcg was administered intravenously. Moderate Sedation Time: 40 minutes. The patient's level of consciousness and vital signs were monitored continuously by radiology nursing throughout the procedure under my direct supervision. CONTRAST:  78 mL Isovue-300 FLUOROSCOPY TIME:  Fluoroscopy Time: 7 minutes and 30 seconds. 595 mGy. COMPLICATIONS: None immediate. PROCEDURE: Informed consent was obtained from the patient following explanation of the procedure, risks, benefits and alternatives. The patient understands, agrees and consents for the procedure. All questions were addressed. A time out was performed prior to the initiation of the procedure. Maximal barrier sterile technique utilized including caps, mask, sterile gowns, sterile gloves,  large sterile drape, hand hygiene, and Betadine prep. Ultrasound was used to confirm patency of the right common femoral artery. Under ultrasound guidance, access of the artery was performed with a 21 gauge needle and micropuncture set. Ultrasound image documentation was performed. A 5 French vascular sheath was  placed. A 5 French Cobra catheter was then advanced into the abdominal aorta an used to selectively catheterize the superior mesenteric artery. Selective arteriography was performed. The catheter was then further advanced passed a pancreaticoduodenal trunk and additional selective arteriography performed in 2 different projections. The Cobra catheter and additional Sos catheter were used to attempt catheterization of the inferior mesenteric artery. Catheters were then removed. Oblique contrast injection was performed at the level of the femoral arteriotomy. Arteriotomy closure was performed with the Cordis Exoseal device. FINDINGS: SMA arteriography initially suggested potentially focal contrast accumulation along the medial aspect of the cecum in a rounded configuration. This was investigated under real-time fluoroscopy and did not show definitive contrast accumulation and was felt to relate to bowel gas. There also was no transit of density into the lumen of the adjacent cecum. No vascular malformations are identified. The IMA origin could not be located or selectively catheterized. IMPRESSION: No definitive active arterial bleeding identified in the SMA distribution. Focal area of density along the medial aspect of the cecum during SMA arteriography did not show persistence with fluoroscopy or transit in the bowel lumen and was felt to most likely relate to bowel gas rather than contrast extravasation. Electronically Signed   By: Irish Lack M.D.   On: 05/28/2016 08:35   Dg Chest Port 1 View  Result Date: 05/28/2016 CLINICAL DATA:  GI bleed.  Line placement. EXAM: PORTABLE CHEST 1 VIEW COMPARISON:  None. FINDINGS: Low lung volumes. Borderline cardiac enlargement. Thoracic atherosclerosis. RIGHT IJ central venous line tip SVC RA junction. No pneumothorax. Bibasilar scarring without infiltrates or significant atelectasis. No effusion. Osteopenia. IMPRESSION: Chronic changes as described. RIGHT IJ catheter  tip SVC RA junction.  No pneumothorax. Electronically Signed   By: Elsie Stain M.D.   On: 05/28/2016 09:20   Ir Embo Art  Peter Minium Hem[facility] Lymph [facility] Scripts Guide Roadmapping  Result Date: 05/28/2016 CLINICAL DATA:  Lower GI Possible diverticular bleed EXAM: SELECTIVE VISCERAL ARTERIOGRAPHY ADDITIONAL ARTERIOGRAPHY IR ULTRASOUND GUIDANCE VASC ACCESS RIGHT IR EMBO ART  VEN HEMORR LYMPH EXTRAV  INC GUIDE ROADMAPPING ANESTHESIA/SEDATION: Intravenous Fentanyl and Versed were administered as conscious sedation during continuous monitoring of the patient's level of consciousness and physiological / cardiorespiratory status by the radiology RN, with a total moderate sedation time of 29 minutes. MEDICATIONS: Lidocaine 1% subcutaneous CONTRAST:  25 mL Visipaque 320 PROCEDURE: The procedure, risks (including but not limited to bleeding, infection, organ damage ), benefits, and alternatives were explained to the patient. Questions regarding the procedure were encouraged and answered. The patient understands and consents to the procedure. Right femoral region prepped and draped in usual sterile fashion. Maximal barrier sterile technique was utilized including caps, mask, sterile gowns, sterile gloves, sterile drape, hand hygiene and skin antiseptic. The right common femoral artery was localized under ultrasound. Under real-time ultrasound guidance, the vessel was accessed with a 21-gauge micropuncture needle, exchanged over a 018 guidewire for a transitional dilator, through which a 035 guidewire was advanced. Over this, a 5 [place] vascular sheath was placed, through which a 5 [place] C2 catheter was advanced and used to selectively catheterize the superior mesenteric artery for selective arteriography . A coaxial renegade microcatheter was then advanced with  an angled 018 guidewire in used to selectively catheterize second and third order mesenteric branches of the SMA. Confirmatory arteriography was performed. The catheter was  advanced distally into the third order branch supplying the bleeding segment and the branch was embolized with 2 mm coils to cessation of flow, confirmed with contrast injection. The catheters and sheath were removed and hemostasis achieved with the aid of the Exoseal device after confirmatory femoral arteriography. The patient tolerated the procedure well. COMPLICATIONS: None immediate FINDINGS: Intermittent Active arterial extravasation supplied by a third order branch of the superior mesenteric artery, corresponding to region of extravasation seen on previous scintigraphy. Technically successful 2 mm coil embolization of distal mesentery branch supplying the affected segment. IMPRESSION: 1. Positive for active extravasation into the lumen of the proximal colon from a distal branch of the SMA. 2. Superselective 2 mm coil embolization of the supplying arterial branch performed without complication. Electronically Signed   By: Lucrezia Europe M.D.   On: 05/28/2016 13:56     Discharge Exam: Vitals:   05/30/16 2129 05/31/16 0517  BP:  (!) 177/74  Pulse: 67 66  Resp: 20 20  Temp:  98.1 F (36.7 C)   Vitals:   05/30/16 1545 05/30/16 2117 05/30/16 2129 05/31/16 0517  BP: (!) 153/73 140/61  (!) 177/74  Pulse: 78 64 67 66  Resp: (!) '26 20 20 20  '$ Temp: 98.6 F (37 C) 99 F (37.2 C)  98.1 F (36.7 C)  TempSrc: Oral Oral  Oral  SpO2: 98% 97% 95% 93%  Weight:      Height:        General: Pt is alert, awake, not in acute distress Cardiovascular: RRR, S1/S2 +, no rubs, no gallops Respiratory: CTA bilaterally, no wheezing, no rhonchi Abdominal: Soft, NT, ND, bowel sounds + Extremities: no edema, no cyanosis   The results of significant diagnostics from this hospitalization (including imaging, microbiology, ancillary and laboratory) are listed below for reference.     Microbiology: Recent Results (from the past 240 hour(s))  MRSA PCR Screening     Status: None   Collection Time: 05/28/16  2:59  AM  Result Value Ref Range Status   MRSA by PCR NEGATIVE NEGATIVE Final    Comment:        The GeneXpert MRSA Assay (FDA approved for NASAL specimens only), is one component of a comprehensive MRSA colonization surveillance program. It is not intended to diagnose MRSA infection nor to guide or monitor treatment for MRSA infections.   Culture, blood (Routine X 2) w Reflex to ID Panel     Status: None (Preliminary result)   Collection Time: 05/28/16  4:16 PM  Result Value Ref Range Status   Specimen Description BLOOD BLOOD RIGHT ARM  Final   Special Requests BOTTLES DRAWN AEROBIC AND ANAEROBIC 7 CC EA  Final   Culture   Final    NO GROWTH 2 DAYS Performed at [facility], [place] [address], [place], [place] [address]    Report Status PENDING  Incomplete  Culture, blood (Routine X 2) w Reflex to ID Panel     Status: None (Preliminary result)   Collection Time: 05/28/16  4:26 PM  Result Value Ref Range Status   Specimen Description BLOOD BLOOD LEFT HAND  Final   Special Requests BOTTLES DRAWN AEROBIC AND ANAEROBIC 5.5 CC EA  Final   Culture   Final    NO GROWTH 2 DAYS Performed at [facility], [place]  [address] [place], [place] [address]    Report Status PENDING  Incomplete     Labs: BNP (last 3 results) No results for input(s): BNP in the last 8760 hours. Basic Metabolic Panel:  Recent Labs Lab 05/28/16 0321  05/28/16 1605 05/29/16 0304 05/29/16 0900 05/30/16 0500 05/31/16 0516  NA 138  --  139  --  139 139 140  K 6.5*  < > 5.7* 5.2* 5.1 4.9 4.7  CL 117*  --  117*  --  115* 114* 113*  CO2 18*  --  19*  --  20* 22 23  GLUCOSE 177*  --  172*  --  168* 123* 133*  BUN 38*  --  37*  --  35* 24* 21*  CREATININE 2.08*  --  1.96*  --  1.72* 1.32* 1.43*  CALCIUM 7.5*  --  7.2*  --  7.5* 7.9* 8.4*  MG  --   --  1.3*  --  1.3*  --   --   PHOS  --   --  3.5  --   --   --   --   < > = values in this interval not displayed. Liver Function Tests:  Recent Labs Lab  05/27/16 1954 05/28/16 1605 05/30/16 0500  AST 33 20  --   ALT 13* 15*  --   ALKPHOS 41 31*  --   BILITOT 1.1 0.5  --   PROT 5.6* 4.3*  --   ALBUMIN 2.6* 2.3* 2.3*   No results for input(s): LIPASE, AMYLASE in the last 168 hours. No results for input(s): AMMONIA in the last 168 hours. CBC:  Recent Labs Lab 05/28/16 0620 05/28/16 1605 05/29/16 0304 05/29/16 0900 05/30/16 0500 05/31/16 0516  WBC 14.7* 10.5  --  9.3 7.2 6.6  NEUTROABS  --   --   --  5.2  --  3.7  HGB 6.6* 7.7* 6.6* 7.7* 7.3* 7.9*  HCT 19.2* 22.2* 18.9* 22.1* 21.5* 23.5*  MCV 83.8 84.1  --  84.4 86.7 88.7  PLT 160 178  --  148* 123* 134*   Cardiac Enzymes:  Recent Labs Lab 05/28/16 1605 05/29/16 0900  TROPONINI <0.03 0.03*   BNP: Invalid input(s): POCBNP CBG:  Recent Labs Lab 05/30/16 1624 05/30/16 2013 05/31/16 0035 05/31/16 0406 05/31/16 0728  GLUCAP 127* 170* 132* 117* 131*   D-Dimer No results for input(s): DDIMER in the last 72 hours. Hgb A1c No results for input(s): HGBA1C in the last 72 hours. Lipid Profile No results for input(s): CHOL, HDL, LDLCALC, TRIG, CHOLHDL, LDLDIRECT in the last 72 hours. Thyroid function studies No results for input(s): TSH, T4TOTAL, T3FREE, THYROIDAB in the last 72 hours.  Invalid input(s): FREET3 Anemia work up No results for input(s): VITAMINB12, FOLATE, FERRITIN, TIBC, IRON, RETICCTPCT in the last 72 hours. Urinalysis    Component Value Date/Time   COLORURINE YELLOW 03/23/2014 1016   APPEARANCEUR CLEAR 03/23/2014 1016   LABSPEC 1.015 03/23/2014 1016   PHURINE 5.5 03/23/2014 1016   GLUCOSEU >=1000 (A) 03/23/2014 1016   HGBUR NEGATIVE 03/23/2014 1016   BILIRUBINUR NEGATIVE 03/23/2014 1016   KETONESUR NEGATIVE 03/23/2014 1016   UROBILINOGEN 0.2 03/23/2014 1016   NITRITE NEGATIVE 03/23/2014 1016   LEUKOCYTESUR NEGATIVE 03/23/2014 1016   Sepsis Labs Invalid input(s): PROCALCITONIN,  WBC,  LACTICIDVEN Microbiology Recent Results (from the  past 240 hour(s))  MRSA PCR Screening     Status: None   Collection Time: 05/28/16  2:59 AM  Result Value Ref Range  Status   MRSA by PCR NEGATIVE NEGATIVE Final    Comment:        The GeneXpert MRSA Assay (FDA approved for NASAL specimens only), is one component of a comprehensive MRSA colonization surveillance program. It is not intended to diagnose MRSA infection nor to guide or monitor treatment for MRSA infections.   Culture, blood (Routine X 2) w Reflex to ID Panel     Status: None (Preliminary result)   Collection Time: 05/28/16  4:16 PM  Result Value Ref Range Status   Specimen Description BLOOD BLOOD RIGHT ARM  Final   Special Requests BOTTLES DRAWN AEROBIC AND ANAEROBIC 7 CC EA  Final   Culture   Final    NO GROWTH 2 DAYS Performed at [facility], [place] [address], [place], [place] [address]    Report Status PENDING  Incomplete  Culture, blood (Routine X 2) w Reflex to ID Panel     Status: None (Preliminary result)   Collection Time: 05/28/16  4:26 PM  Result Value Ref Range Status   Specimen Description BLOOD BLOOD LEFT HAND  Final   Special Requests BOTTLES DRAWN AEROBIC AND ANAEROBIC 5.5 CC EA  Final   Culture   Final    NO GROWTH 2 DAYS Performed at [facility], [place] [address] [place], [place] [address]    Report Status PENDING  Incomplete     Time coordinating discharge: 40 minutes  SIGNED:  Chipper Oman, MD  [facility] 05/31/2016, 10:15 AM  Pager please text page via  [email or web address] Password [email or web address]

## 2016-06-02 LAB — CULTURE, BLOOD (ROUTINE X 2)
CULTURE: NO GROWTH
Culture: NO GROWTH

## 2016-06-03 ENCOUNTER — Emergency Department (HOSPITAL_COMMUNITY): Payer: Medicare Other

## 2016-06-03 ENCOUNTER — Encounter (HOSPITAL_COMMUNITY): Payer: Self-pay | Admitting: Nurse Practitioner

## 2016-06-03 ENCOUNTER — Inpatient Hospital Stay (HOSPITAL_COMMUNITY)
Admission: EM | Admit: 2016-06-03 | Discharge: 2016-06-16 | DRG: 329 | Disposition: A | Discharge status: Home Health | Payer: Medicare Other | Attending: Internal Medicine | Admitting: Internal Medicine

## 2016-06-03 ENCOUNTER — Inpatient Hospital Stay (HOSPITAL_COMMUNITY): Payer: Medicare Other

## 2016-06-03 DIAGNOSIS — N400 Enlarged prostate without lower urinary tract symptoms: Secondary | ICD-10-CM | POA: Diagnosis present

## 2016-06-03 DIAGNOSIS — M7989 Other specified soft tissue disorders: Secondary | ICD-10-CM | POA: Diagnosis not present

## 2016-06-03 DIAGNOSIS — Z7982 Long term (current) use of aspirin: Secondary | ICD-10-CM | POA: Diagnosis not present

## 2016-06-03 DIAGNOSIS — K9189 Other postprocedural complications and disorders of digestive system: Secondary | ICD-10-CM | POA: Diagnosis not present

## 2016-06-03 DIAGNOSIS — K219 Gastro-esophageal reflux disease without esophagitis: Secondary | ICD-10-CM | POA: Diagnosis present

## 2016-06-03 DIAGNOSIS — I10 Essential (primary) hypertension: Secondary | ICD-10-CM | POA: Diagnosis present

## 2016-06-03 DIAGNOSIS — I82611 Acute embolism and thrombosis of superficial veins of right upper extremity: Secondary | ICD-10-CM | POA: Diagnosis not present

## 2016-06-03 DIAGNOSIS — K2270 Barrett's esophagus without dysplasia: Secondary | ICD-10-CM | POA: Diagnosis present

## 2016-06-03 DIAGNOSIS — K5731 Diverticulosis of large intestine without perforation or abscess with bleeding: Principal | ICD-10-CM | POA: Diagnosis present

## 2016-06-03 DIAGNOSIS — E440 Moderate protein-calorie malnutrition: Secondary | ICD-10-CM | POA: Diagnosis present

## 2016-06-03 DIAGNOSIS — K567 Ileus, unspecified: Secondary | ICD-10-CM

## 2016-06-03 DIAGNOSIS — N183 Chronic kidney disease, stage 3 (moderate): Secondary | ICD-10-CM | POA: Diagnosis present

## 2016-06-03 DIAGNOSIS — R578 Other shock: Secondary | ICD-10-CM | POA: Diagnosis present

## 2016-06-03 DIAGNOSIS — N179 Acute kidney failure, unspecified: Secondary | ICD-10-CM | POA: Diagnosis present

## 2016-06-03 DIAGNOSIS — E038 Other specified hypothyroidism: Secondary | ICD-10-CM | POA: Diagnosis present

## 2016-06-03 DIAGNOSIS — Z8249 Family history of ischemic heart disease and other diseases of the circulatory system: Secondary | ICD-10-CM | POA: Diagnosis not present

## 2016-06-03 DIAGNOSIS — Z7984 Long term (current) use of oral hypoglycemic drugs: Secondary | ICD-10-CM | POA: Diagnosis not present

## 2016-06-03 DIAGNOSIS — E1122 Type 2 diabetes mellitus with diabetic chronic kidney disease: Secondary | ICD-10-CM | POA: Diagnosis present

## 2016-06-03 DIAGNOSIS — E039 Hypothyroidism, unspecified: Secondary | ICD-10-CM | POA: Diagnosis present

## 2016-06-03 DIAGNOSIS — K922 Gastrointestinal hemorrhage, unspecified: Secondary | ICD-10-CM

## 2016-06-03 DIAGNOSIS — D72829 Elevated white blood cell count, unspecified: Secondary | ICD-10-CM | POA: Diagnosis present

## 2016-06-03 DIAGNOSIS — E119 Type 2 diabetes mellitus without complications: Secondary | ICD-10-CM

## 2016-06-03 DIAGNOSIS — R55 Syncope and collapse: Secondary | ICD-10-CM

## 2016-06-03 DIAGNOSIS — D62 Acute posthemorrhagic anemia: Secondary | ICD-10-CM | POA: Diagnosis present

## 2016-06-03 DIAGNOSIS — Z79899 Other long term (current) drug therapy: Secondary | ICD-10-CM

## 2016-06-03 DIAGNOSIS — J189 Pneumonia, unspecified organism: Secondary | ICD-10-CM

## 2016-06-03 DIAGNOSIS — K625 Hemorrhage of anus and rectum: Secondary | ICD-10-CM

## 2016-06-03 DIAGNOSIS — J9601 Acute respiratory failure with hypoxia: Secondary | ICD-10-CM | POA: Diagnosis not present

## 2016-06-03 DIAGNOSIS — R030 Elevated blood-pressure reading, without diagnosis of hypertension: Secondary | ICD-10-CM | POA: Diagnosis not present

## 2016-06-03 DIAGNOSIS — Z833 Family history of diabetes mellitus: Secondary | ICD-10-CM | POA: Diagnosis not present

## 2016-06-03 HISTORY — DX: Syncope and collapse: R55

## 2016-06-03 LAB — CBC WITH DIFFERENTIAL/PLATELET
BASOS ABS: 0.1 10*3/uL (ref 0.0–0.1)
Basophils Relative: 1 %
EOS ABS: 0 10*3/uL (ref 0.0–0.7)
Eosinophils Relative: 0 %
HCT: 16 % — ABNORMAL LOW (ref 39.0–52.0)
HEMOGLOBIN: 5.1 g/dL — AB (ref 13.0–17.0)
LYMPHS PCT: 15 %
Lymphs Abs: 2 10*3/uL (ref 0.7–4.0)
MCH: 29.5 pg (ref 26.0–34.0)
MCHC: 31.9 g/dL (ref 30.0–36.0)
MCV: 92.5 fL (ref 78.0–100.0)
Monocytes Absolute: 0.8 10*3/uL (ref 0.1–1.0)
Monocytes Relative: 6 %
NEUTROS PCT: 78 %
Neutro Abs: 10.3 10*3/uL — ABNORMAL HIGH (ref 1.7–7.7)
PLATELETS: 173 10*3/uL (ref 150–400)
RBC: 1.73 MIL/uL — AB (ref 4.22–5.81)
RDW: 17.9 % — ABNORMAL HIGH (ref 11.5–15.5)
WBC: 13.2 10*3/uL — AB (ref 4.0–10.5)

## 2016-06-03 LAB — COMPREHENSIVE METABOLIC PANEL
ALT: 14 U/L — AB (ref 17–63)
ANION GAP: 7 (ref 5–15)
AST: 25 U/L (ref 15–41)
Albumin: 2.5 g/dL — ABNORMAL LOW (ref 3.5–5.0)
Alkaline Phosphatase: 40 U/L (ref 38–126)
BUN: 24 mg/dL — ABNORMAL HIGH (ref 6–20)
CALCIUM: 7.6 mg/dL — AB (ref 8.9–10.3)
CO2: 20 mmol/L — ABNORMAL LOW (ref 22–32)
CREATININE: 1.51 mg/dL — AB (ref 0.61–1.24)
Chloride: 112 mmol/L — ABNORMAL HIGH (ref 101–111)
GFR calc Af Amer: 48 mL/min — ABNORMAL LOW (ref >60)
GFR calc non Af Amer: 42 mL/min — ABNORMAL LOW (ref >60)
Glucose, Bld: 264 mg/dL — ABNORMAL HIGH (ref 65–99)
Potassium: 4.6 mmol/L (ref 3.5–5.1)
SODIUM: 139 mmol/L (ref 135–145)
TOTAL PROTEIN: 5.2 g/dL — AB (ref 6.5–8.1)
Total Bilirubin: 0.7 mg/dL (ref 0.3–1.2)

## 2016-06-03 LAB — GLUCOSE, CAPILLARY
GLUCOSE-CAPILLARY: 192 mg/dL — AB (ref 65–99)
Glucose-Capillary: 175 mg/dL — ABNORMAL HIGH (ref 65–99)

## 2016-06-03 LAB — PROTIME-INR
INR: 1.22
PROTHROMBIN TIME: 15.4 s — AB (ref 11.4–15.2)

## 2016-06-03 LAB — APTT: APTT: 26 s (ref 24–36)

## 2016-06-03 LAB — PREPARE RBC (CROSSMATCH)

## 2016-06-03 MED ORDER — SODIUM CHLORIDE 0.9% FLUSH
10.0000 mL | Freq: Two times a day (BID) | INTRAVENOUS | Status: DC
  Administered 2016-06-03 – 2016-06-05 (×3): 10 mL

## 2016-06-03 MED ORDER — INSULIN ASPART 100 UNIT/ML ~~LOC~~ SOLN: 0.0000 [IU] | Freq: Three times a day (TID) | SUBCUTANEOUS | Status: DC

## 2016-06-03 MED ORDER — SODIUM CHLORIDE 0.9 % IV SOLN
INTRAVENOUS | Status: DC
  Administered 2016-06-03: 125 mL/h via INTRAVENOUS
  Administered 2016-06-05: 04:00:00 via INTRAVENOUS

## 2016-06-03 MED ORDER — SODIUM CHLORIDE 0.9 % IV SOLN
Freq: Once | INTRAVENOUS | Status: AC
  Administered 2016-06-03: 10 mL/h via INTRAVENOUS

## 2016-06-03 MED ORDER — SODIUM CHLORIDE 0.9 % IV SOLN
Freq: Once | INTRAVENOUS | Status: AC
  Administered 2016-06-03: 22:00:00 via INTRAVENOUS

## 2016-06-03 MED ORDER — CHLORHEXIDINE GLUCONATE CLOTH 2 % EX PADS
6.0000 | MEDICATED_PAD | Freq: Every day | CUTANEOUS | Status: DC
  Administered 2016-06-03 – 2016-06-16 (×11): 6 via TOPICAL

## 2016-06-03 MED ORDER — LEVOTHYROXINE SODIUM 25 MCG PO TABS
125.0000 ug | ORAL_TABLET | Freq: Every day | ORAL | Status: DC
  Administered 2016-06-04 – 2016-06-05 (×2): 125 ug via ORAL
  Filled 2016-06-03 (×2): qty 1

## 2016-06-03 MED ORDER — SODIUM CHLORIDE 0.9% FLUSH: 10.0000 mL | INTRAVENOUS | Status: DC | PRN

## 2016-06-03 NOTE — Progress Notes (Signed)
Subjective: Patient was seen by our service on 05/28/16 last week. He presented with presyncopal episode, unable to walk secondary to weakness. Along with acute onset of bright red blood from his rectum. He had a similar but less acute episode about 6 years prior to this last admission. He's had prior colonoscopies from January 2001 and June 2011 which shows severe diverticulosis in the sigmoid to the descending colon and moderate diverticulitis found in the right colon with large mouth tics. Moderate diverticulosis found in the right colon with large mouth tics. No cancer or polyps were seen.  Upon presentation on 05/27/16, he was afebrile, but hypotensive and anemic. He was transfused, and taken to radiology where he underwent a nuclear medicine GI bleeding scan. Showed active bleeding in the hepatic flexure/proximal transverse colon. He was then seen in interventional radiology and underwent IR angiogram and later in the day went underwent IR embolization of a third order branch of the SMA corresponding to the region of extravasation. He stabilized after this and was discharged home on 05/31/16.  He returns today with recurrent bleeding. He reports every time he moves he would essentially pass out. He had black stools last night states his abdomen felt tight and full. Workup in the ED shows he is afebrile. Mildly tachycardic but his blood pressure was stable lying down anyway. Creatinine is 1.51, his glucose is 264. LFTs are essentially normal. Albumin and protein are both low. WBC is 13.2 hemoglobin is 5.1, hematocrit was 16.0. On discharge 05/31/16 hemoglobin 7.9 hematocrit was 23.5. This chronic kidney disease and creatinine on admission last time was 2.08. On discharge it was 1.43 after 2 arteriograms. Today it is 1.51. We are asked to see.  Objective: Vital signs in last 24 hours: Temp:  [97.9 F (36.6 C)-98.8 F (37.1 C)] 98.8 F (37.1 C) (03/26 1630) Pulse Rate:  [84-104] 87 (03/26  1640) Resp:  [15-29] 29 (03/26 1640) BP: (81-133)/(60-79) 133/75 (03/26 1630) SpO2:  [92 %-100 %] 96 % (03/26 1640) Weight:  [79.4 kg (175 lb)] 79.4 kg (175 lb) (03/26 1243)    Intake/Output from previous day: No intake/output data recorded. Intake/Output this shift: Total I/O In: 1505 [I.V.:470; Blood:1035] Out: -   General appearance: alert, cooperative and no distress Resp: clear to auscultation bilaterally Cardio: tachycardic GI: soft, non-tender; bowel sounds normal; no masses,  no organomegaly.  He just has 2 more bloody stools prior to my coming into the roomm Extremities: extremities normal, atraumatic, no cyanosis or edema and he only has 1 IV site.  They cannot find a second site Pulses: 2+ and symmetric Skin: Skin color, texture, turgor normal. No rashes or lesions or pale Neurologic: Grossly normal  Lab Results:   Recent Labs  06/03/16 1328  WBC 13.2*  HGB 5.1*  HCT 16.0*  PLT 173    BMET  Recent Labs  06/03/16 1328  NA 139  K 4.6  CL 112*  CO2 20*  GLUCOSE 264*  BUN 24*  CREATININE 1.51*  CALCIUM 7.6*   PT/INR  Recent Labs  06/03/16 1328  LABPROT 15.4*  INR 1.22     Recent Labs Lab 05/27/16 1954 05/28/16 1605 05/30/16 0500 06/03/16 1328  AST 33 20  --  25  ALT 13* 15*  --  14*  ALKPHOS 41 31*  --  40  BILITOT 1.1 0.5  --  0.7  PROT 5.6* 4.3*  --  5.2*  ALBUMIN 2.6* 2.3* 2.3* 2.5*     Lipase  No  results found for: LIPASE   Studies/Results: Dg Chest Portable 1 View  Result Date: 06/03/2016 CLINICAL DATA:  83 year old male with the vagal episodes. Subsequent encounter. EXAM: PORTABLE CHEST 1 VIEW COMPARISON:  05/28/2016 chest x-ray FINDINGS: Cardiomegaly. Calcified tortuous aorta. No infiltrate, congestive heart failure or pneumothorax. No plain film evidence of pulmonary malignancy. Right central line removed. IMPRESSION: Cardiomegaly. Calcified tortuous aorta. No infiltrate or congestive heart failure. Electronically Signed    By: Genia Del M.D.   On: 06/03/2016 14:47    Medications:  . sodium chloride Stopped (06/03/16 1608)  Being transfused second unit of PRBC'S   Assessment/Plan Recurrent acute lower GI bleed. Status post appear mesenteric arteriogram and superselective coil embolization of active bleeding, 05/28/16 Dr. Vernard Gambles IR. Acute on chronic kidney disease.- Creatinine 1.51 Type 2 diabetes mellitus. GERD. Anemia -  H/H: 5.1/16  Plan: We will reviewed and discussed with Dr. Barry Dienes.  Currently no studies ordered to reevaluate bleeding site.  Concerns for Contrast nephropathy with another IR evaluation. He is on his second unit of blood now.  HR 76, BP 109/64, supine       LOS: 0 days    Earnstine Regal 06/03/2016 [phone]

## 2016-06-03 NOTE — ED Triage Notes (Signed)
Patient had surgery on an artery in or near colon about a weeks ago and had problems with it bleeding had to come in for 3 units of blood. Now everytime he moves he vagals down and passes out. Had some black stools last night. States his abdomen feels tight or full.

## 2016-06-03 NOTE — ED Notes (Signed)
Bed: HO12 Expected date:  Expected time:  Means of arrival:  Comments: EMS-post op problem

## 2016-06-03 NOTE — Consult Note (Signed)
Referring Provider: [facility]    Primary Care Physician:  No PCP Per Patient Primary Gastroenterologist:  Previously Dr. Sharlett Iles, now Dr. Henrene Pastor  Reason for Consultation:  Lower GI bleeding   Attending physician's note   I have taken a history, examined the patient and reviewed the chart. I agree with the Advanced Practitioner's note, impression and recommendations.  Major recurrent LGI bleeding, hemorrhagic shock and severe ABL anemia. Hepatic flexure bleed last week with IR coil embolization of distal SMA branch.  IV volume resuscitation and transfusions to keep Hb > 8. Colonoscopy can be performed if/when bleeding has stopped for diagnostic purposes. Colonoscopy is not helpful for active LGI bleeding.   IR and general surgery consulted for mgmt of active LGI bleeding and if bleeding persists they will confer on mgmt.     [name], MD [name] [phone] Mon-Fri 8a-5p [phone] after 5p, weekends, holidays  ASSESSMENT AND PLAN:   83 yo male with recurrent painless hematochezia. Recent admission for lower GI bleeding / hemorrhagic shock and severe anemia of acute blood loss. Tagged cell scan showed active bleeding in hepatic flexure and patient underwent coil embolization of a distal branch of SMA.  -Blood transfusion is in progress. -Hospitalist admitting in room now -If tagged cell scan positive then will ask IR to evaluate.  May need surgical evaluation as well.   Addendum : I called IR as EDP mentioned that IR may not have anything else to offer in which case I would ask Surgery to see. Dr. Pascal Lux in IR spoke with me and admitting Hospitalist at length. Will hold off on a bleeding scan as bleeding most likely same site. IR does in fact feel they maximized treatment during last intervention. Surgery to see. If bleeding persists and Surgery doesn't feel patient appropriate for surgery then IR will reconsider repeat angiogram.  -He has been typed and crossed for 2 units. Need  to keep some blood on hold. Just had another large bloody BM  DM2 Hold glucophage ( IV contrast may be needed)  AKI on CKD 3  Barrett's esophagus, on PPI   HPI: Zachary Cook is an  83 y.o. male who was hospitalized a few days ago with lower GI bleeding / hemorrhagic shock. Bleeding scan was positive at hepatic flexure. Angiogram initially felt to be negative but relook was positive and patient underwent colil embolization of a distal branch of SMA on 05/28/16. No bleeding for 72 hours, discharged on 3/23. Over the weekend he had two near syncopal episodes. Felt like a shutter was being pulled down over his eyes. He was too weak to get out of the recliner this am. Wife states he was incontinent of stool while in the recliner but there was no obvious blood. After arriving to the ED patient had a large bloody BM around 10 am. Hgb was 7.9 at discharge 5 days ago, currently 5.1. Patient doesn't take blood thinners, he has had only one dose asa since discharge. No abdominal pain. Currently he is breathing easier on 02. No chest pain. Family at bedside.     Past Medical History:  Diagnosis Date  . Barrett's esophagus   . Diabetes mellitus without complication ([place])   . Diverticulosis   . Duodenitis   . GERD (gastroesophageal reflux disease)   . Hiatal hernia     Past Surgical History:  Procedure Laterality Date  . IR GENERIC HISTORICAL  05/28/2016   IR ANGIOGRAM SELECTIVE EACH ADDITIONAL VESSEL 05/28/2016 Aletta Edouard, MD [facility]  . IR GENERIC HISTORICAL  05/28/2016   IR ANGIOGRAM VISCERAL SELECTIVE 05/28/2016 Aletta Edouard, MD [facility]  . IR GENERIC HISTORICAL  05/28/2016   IR US GUIDE VASC ACCESS RIGHT 05/28/2016 Aletta Edouard, MD [facility]  . IR GENERIC HISTORICAL  05/28/2016   IR ANGIOGRAM VISCERAL SELECTIVE 05/28/2016 Arne Cleveland, MD [facility]  . IR GENERIC HISTORICAL  05/28/2016   IR ANGIOGRAM SELECTIVE EACH ADDITIONAL VESSEL 05/28/2016 Arne Cleveland, MD  [facility]  . IR GENERIC HISTORICAL  05/28/2016   IR EMBO ART  VEN HEMORR LYMPH EXTRAV  INC GUIDE ROADMAPPING 05/28/2016 Arne Cleveland, MD [facility]  . IR GENERIC HISTORICAL  05/28/2016   IR US GUIDE VASC ACCESS RIGHT 05/28/2016 Arne Cleveland, MD [facility]  . VASECTOMY      Prior to Admission medications   Medication Sig Start Date End Date Taking? Authorizing Provider  aspirin 81 MG tablet Take 81 mg by mouth daily.    Historical Provider, MD  B-D UF III MINI PEN NEEDLES 31G X 5 MM MISC USE ONE PER DAY WITH VICTOZA 11/22/15   Elayne Snare, MD  Blood Glucose Monitoring Suppl (ACCU-CHEK NANO SMARTVIEW) W/DEVICE KIT  12/02/13   Historical Provider, MD  esomeprazole (NEXIUM) 40 MG capsule TAKE ONE CAPSULE BY MOUTH DAILY AT NOON 04/14/16   [name], MD  ferrous sulfate 325 (65 FE) MG tablet Take 1 tablet (325 mg total) by mouth 3 (three) times daily with meals. 05/31/16   Doreatha Lew, MD  FLUAD 0.5 ML SUSY  11/30/15   Historical Provider, MD  glimepiride (AMARYL) 2 MG tablet TAKE 2 TABLETS(4 MG) BY MOUTH DAILY BEFORE DINNER Patient taking differently: take '2mg'$  by mouth with dinner 04/05/16   Elayne Snare, MD  glucose blood (ACCU-CHEK SMARTVIEW) test strip Test blood sugar twice daily 04/10/16   Elayne Snare, MD  Insulin Glargine (TOUJEO SOLOSTAR) 300 UNIT/ML SOPN Inject 12 Units into the skin daily. 04/10/16   Elayne Snare, MD  levothyroxine (SYNTHROID, LEVOTHROID) 125 MCG tablet Take 1 tablet (125 mcg total) by mouth daily before breakfast. 05/07/16   Elayne Snare, MD  liraglutide (VICTOZA) 18 MG/3ML SOPN ADMINISTER 1.2 MG UNDER THE SKIN DAILY 05/07/16   Elayne Snare, MD  metFORMIN (GLUCOPHAGE-XR) 750 MG 24 hr tablet TAKE 2 TABLETS(1500 MG) BY MOUTH DAILY WITH BREAKFAST 05/23/16   Elayne Snare, MD  simvastatin (ZOCOR) 40 MG tablet TAKE 1 TABLET(40 MG) BY MOUTH AT BEDTIME 02/04/16   Chelle Jeffery, PA-C  tamsulosin (FLOMAX) 0.4 MG CAPS capsule TAKE 1 CAPSULE BY MOUTH EVERY DAY 03/24/16   [name], MD    Current Facility-Administered Medications  Medication Dose Route Frequency Provider Last Rate Last Dose  . 0.9 %  sodium chloride infusion   Intravenous Continuous Dorie Rank, MD 125 mL/hr at 06/03/16 1336 125 mL/hr at 06/03/16 1336   Current Outpatient Prescriptions  Medication Sig Dispense Refill  . aspirin 81 MG tablet Take 81 mg by mouth daily.    . B-D UF III MINI PEN NEEDLES 31G X 5 MM MISC USE ONE PER DAY WITH VICTOZA 100 each 5  . Blood Glucose Monitoring Suppl (ACCU-CHEK NANO SMARTVIEW) W/DEVICE KIT     . esomeprazole (NEXIUM) 40 MG capsule TAKE ONE CAPSULE BY MOUTH DAILY AT NOON 90 capsule 0  . ferrous sulfate 325 (65 FE) MG tablet Take 1 tablet (325 mg total) by mouth 3 (three) times daily with meals. 90 tablet 0  . FLUAD 0.5 ML  SUSY     . glimepiride (AMARYL) 2 MG tablet TAKE 2 TABLETS(4 MG) BY MOUTH DAILY BEFORE DINNER (Patient taking differently: take 2mg  by mouth with dinner) 60 tablet 0  . glucose blood (ACCU-CHEK SMARTVIEW) test strip Test blood sugar twice daily 100 each 0  . Insulin Glargine (TOUJEO SOLOSTAR) 300 UNIT/ML SOPN Inject 12 Units into the skin daily. 3 pen 3  . levothyroxine (SYNTHROID, LEVOTHROID) 125 MCG tablet Take 1 tablet (125 mcg total) by mouth daily before breakfast. 30 tablet 4  . liraglutide (VICTOZA) 18 MG/3ML SOPN ADMINISTER 1.2 MG UNDER THE SKIN DAILY 6 mL 0  . metFORMIN (GLUCOPHAGE-XR) 750 MG 24 hr tablet TAKE 2 TABLETS(1500 MG) BY MOUTH DAILY WITH BREAKFAST 60 tablet 0  . simvastatin (ZOCOR) 40 MG tablet TAKE 1 TABLET(40 MG) BY MOUTH AT BEDTIME 90 tablet 0  . tamsulosin (FLOMAX) 0.4 MG CAPS capsule TAKE 1 CAPSULE BY MOUTH EVERY DAY 90 capsule 0    Allergies as of 06/03/2016  . (No Known Allergies)    Family History  Problem Relation Age of Onset  . Heart disease Mother   . Diabetes Mother   . Cancer Father     Social History   Social History  . Marital status: Married    Spouse name: N/A  . Number of children: N/A  .  Years of education: N/A   Occupational History  . Not on file.   Social History Main Topics  . Smoking status: Never Smoker  . Smokeless tobacco: Never Used  . Alcohol use No  . Drug use: No  . Sexual activity: Not on file   Other Topics Concern  . Not on file   Social History Narrative  . No narrative on file    Review of Systems: All systems reviewed and negative except where noted in HPI.  Physical Exam: Vital signs in last 24 hours: Temp:  [97.9 F (36.6 C)-98.2 F (36.8 C)] 97.9 F (36.6 C) (03/26 1444) Pulse Rate:  [92-104] 92 (03/26 1444) Resp:  [18-26] 18 (03/26 1444) BP: (100-120)/(60-68) 114/68 (03/26 1444) SpO2:  [92 %-100 %] 100 % (03/26 1300) Weight:  [175 lb (79.4 kg)] 175 lb (79.4 kg) (03/26 1243)   General:   Alert, pale elderly white male in NAD Eyes:  Sclera clear, no icterus.   Conjunctiva pale. Ears:  Normal auditory acuity. Nose:  No deformity, discharge,  or lesions. Mouth:  No deformity or lesions.   Lungs:  Clear throughout to auscultation.   No wheezes, crackles, or rhonchi.  Heart:  Regular rate and rhythm; Abdomen:  Soft,nontender, BS active,no palp mass Msk:  Symmetrical without gross deformities. . Pulses:  Normal pulses noted. Extremities:  Without clubbing or edema. Neurologic:  Alert and  oriented x4;  grossly normal neurologically. Skin:  Intact without significant lesions or rashes.. Psych:  Alert and cooperative. Normal mood and affect.  Intake/Output from previous day: No intake/output data recorded. Intake/Output this shift: Total I/O In: 335 [Blood:335] Out: -   Lab Results:  Recent Labs  06/03/16 1328  WBC 13.2*  HGB 5.1*  HCT 16.0*  PLT 173   BMET  Recent Labs  06/03/16 1328  NA 139  K 4.6  CL 112*  CO2 20*  GLUCOSE 264*  BUN 24*  CREATININE 1.51*  CALCIUM 7.6*   LFT  Recent Labs  06/03/16 1328  PROT 5.2*  ALBUMIN 2.5*  AST 25  ALT 14*  ALKPHOS 40  BILITOT 0.7   PT/INR  Recent Labs   06/03/16 1328  LABPROT 15.4*  INR 1.22   Hepatitis Panel No results for input(s): HEPBSAG, HCVAB, HEPAIGM, HEPBIGM in the last 72 hours.   Studies/Results: Dg Chest Portable 1 View  Result Date: 06/03/2016 CLINICAL DATA:  83 year old male with the vagal episodes. Subsequent encounter. EXAM: PORTABLE CHEST 1 VIEW COMPARISON:  05/28/2016 chest x-ray FINDINGS: Cardiomegaly. Calcified tortuous aorta. No infiltrate, congestive heart failure or pneumothorax. No plain film evidence of pulmonary malignancy. Right central line removed. IMPRESSION: Cardiomegaly. Calcified tortuous aorta. No infiltrate or congestive heart failure. Electronically Signed   By: Genia Del M.D.   On: 06/03/2016 14:47     Tye Savoy, NP-C @  06/03/2016, 3:11 PM  Pager number [phone]

## 2016-06-03 NOTE — Progress Notes (Signed)
Peripherally Inserted Central Catheter/Midline Placement  The IV Nurse has discussed with the patient and/or persons authorized to consent for the patient, the purpose of this procedure and the potential benefits and risks involved with this procedure.  The benefits include less needle sticks, lab draws from the catheter, and the patient may be discharged home with the catheter. Risks include, but not limited to, infection, bleeding, blood clot (thrombus formation), and puncture of an artery; nerve damage and irregular heartbeat and possibility to perform a PICC exchange if needed/ordered by physician.  Alternatives to this procedure were also discussed.  Bard Power PICC patient education guide, fact sheet on infection prevention and patient information card has been provided to patient /or left at bedside.    PICC/Midline Placement Documentation  PICC Double Lumen 06/03/16 PICC Right Brachial 37 cm 0 cm (Active)  Indication for Insertion or Continuance of Line Limited venous access - need for IV therapy >5 days (PICC only);Poor Vasculature-patient has had multiple peripheral attempts or PIVs lasting less than 24 hours 06/03/2016  8:46 PM  Exposed Catheter (cm) 0 cm 06/03/2016  8:46 PM  Site Assessment Clean;Dry;Intact 06/03/2016  8:46 PM  Lumen #1 Status Flushed;Saline locked;Blood return noted 06/03/2016  8:46 PM  Lumen #2 Status Flushed;Saline locked;Blood return noted 06/03/2016  8:46 PM  Dressing Type Transparent 06/03/2016  8:46 PM  Dressing Status Clean;Dry;Intact;Antimicrobial disc in place 06/03/2016  8:46 PM  Dressing Change Due 06/10/16 06/03/2016  8:46 PM       Gordan Payment 06/03/2016, 8:47 PM

## 2016-06-03 NOTE — H&P (Addendum)
History and Physical    Zachary Cook HAL:[id number] DOB: 19-Feb-1933  DOA: 06/03/2016 PCP: No PCP Per Patient  Patient coming from: Home   Chief Complaint: Bloody Bowel Movement   HPI: Zachary Cook is a 83 y.o. male with medical history significant of T2DM, Barrett's and diverticulosis who presented to the emergency room complaining of weakness and feeling that he was going to pass out. Patient was recently discharged after being treated for GI bleed with coil embolization of the distal branch of SMA on 05/28/2016. In the emergency room patient had a large bloody bowel movement and he was found to have hemoglobin of 5.1.   Off note patient on previous admission was hypotensive for what he was treated with pressors for 48 hrs.   Review of Systems:   General:  see H&P  HEENT: no blurry vision, hearing changes or sore throat Respiratory: no dyspnea, coughing, wheezing CV: no chest pain, no palpitations GI: no nausea, vomiting, abdominal pain, diarrhea, constipation GU: no dysuria, burning on urination, increased urinary frequency, hematuria  Ext:. No deformities,  Neuro: no unilateral weakness, numbness, or tingling, no vision change or hearing loss Skin: No rashes, lesions or wounds. MSK: No muscle spasm, no deformity, no limitation of range of movement in spin Heme: No easy bruising.  Travel history: No recent long distant travel.   Past Medical History:  Diagnosis Date  . Barrett's esophagus   . Diabetes mellitus without complication ([place])   . Diverticulosis   . Duodenitis   . GERD (gastroesophageal reflux disease)   . Hiatal hernia     Past Surgical History:  Procedure Laterality Date  . IR GENERIC HISTORICAL  05/28/2016   IR ANGIOGRAM SELECTIVE EACH ADDITIONAL VESSEL 05/28/2016 Aletta Edouard, MD [facility]  . IR GENERIC HISTORICAL  05/28/2016   IR ANGIOGRAM VISCERAL SELECTIVE 05/28/2016 Aletta Edouard, MD [facility]  . IR GENERIC HISTORICAL  05/28/2016   IR US GUIDE VASC ACCESS RIGHT 05/28/2016 Aletta Edouard, MD [facility]  . IR GENERIC HISTORICAL  05/28/2016   IR ANGIOGRAM VISCERAL SELECTIVE 05/28/2016 Arne Cleveland, MD [facility]  . IR GENERIC HISTORICAL  05/28/2016   IR ANGIOGRAM SELECTIVE EACH ADDITIONAL VESSEL 05/28/2016 Arne Cleveland, MD [facility]  . IR GENERIC HISTORICAL  05/28/2016   IR EMBO ART  VEN HEMORR LYMPH EXTRAV  INC GUIDE ROADMAPPING 05/28/2016 Arne Cleveland, MD [facility]  . IR GENERIC HISTORICAL  05/28/2016   IR US GUIDE VASC ACCESS RIGHT 05/28/2016 Arne Cleveland, MD [facility]  . VASECTOMY       reports that he has never smoked. He has never used smokeless tobacco. He reports that he does not drink alcohol or use drugs.  No Known Allergies  Family History  Problem Relation Age of Onset  . Heart disease Mother   . Diabetes Mother   . Cancer Father    Family history reviewed and non-pertinent  Prior to Admission medications   Medication Sig Start Date End Date Taking? Authorizing Provider  aspirin 81 MG tablet Take 81 mg by mouth daily.   Yes Historical Provider, MD  esomeprazole ([place]) 40 MG capsule TAKE ONE CAPSULE BY MOUTH DAILY AT NOON Patient taking differently: TAKE ONE CAPSULE BY MOUTH DAILY 04/14/16  Yes [name], MD  glimepiride (AMARYL) 2 MG tablet TAKE 2 TABLETS(4 MG) BY MOUTH DAILY BEFORE DINNER Patient taking differently: take 71m by mouth with dinner 04/05/16  Yes AElayne Snare MD  levothyroxine (SYNTHROID, LEVOTHROID) 125 MCG tablet Take  1 tablet (125 mcg total) by mouth daily before breakfast. 05/07/16  Yes Elayne Snare, MD  metFORMIN (GLUCOPHAGE-XR) 750 MG 24 hr tablet TAKE 2 TABLETS(1500 MG) BY MOUTH DAILY WITH BREAKFAST 05/23/16  Yes Elayne Snare, MD  naproxen sodium (ANAPROX) 220 MG tablet Take 220 mg by mouth 2 (two) times daily as needed (pain).   Yes Historical Provider, MD  simvastatin (ZOCOR) 40 MG tablet TAKE 1 TABLET(40 MG) BY MOUTH AT BEDTIME 02/04/16  Yes Chelle Jeffery, PA-C   tamsulosin (FLOMAX) 0.4 MG CAPS capsule TAKE 1 CAPSULE BY MOUTH EVERY DAY 03/24/16  Yes [name], MD  B-D UF III MINI PEN NEEDLES 31G X 5 MM MISC USE ONE PER DAY WITH VICTOZA 11/22/15   Elayne Snare, MD  Blood Glucose Monitoring Suppl (ACCU-CHEK NANO SMARTVIEW) W/DEVICE KIT  12/02/13   Historical Provider, MD  ferrous sulfate 325 (65 FE) MG tablet Take 1 tablet (325 mg total) by mouth 3 (three) times daily with meals. 05/31/16   Doreatha Lew, MD  FLUAD 0.5 ML SUSY  11/30/15   Historical Provider, MD  glucose blood (ACCU-CHEK SMARTVIEW) test strip Test blood sugar twice daily 04/10/16   Elayne Snare, MD  Insulin Glargine (TOUJEO SOLOSTAR) 300 UNIT/ML SOPN Inject 12 Units into the skin daily. 04/10/16   Elayne Snare, MD  liraglutide (VICTOZA) 18 MG/3ML SOPN ADMINISTER 1.2 MG UNDER THE SKIN DAILY 05/07/16   Elayne Snare, MD    Physical Exam: Vitals:   06/03/16 1630 06/03/16 1640 06/03/16 1655 06/03/16 1700  BP: 133/75  121/84 121/84  Pulse: 91 87 85 84  Resp: (!) 25 (!) 29 20 (!) 21  Temp: 98.8 F (37.1 C)  98.6 F (37 C)   TempSrc: Oral  Oral   SpO2: 94% 96% 100% 97%  Weight: 77.1 kg (169 lb 15.6 oz)     Height: 5' 6" (1.676 m)        Constitutional: NAD, Pale Eyes: Pale conjunctivae Neck: normal, supple, no masses, no thyromegaly Respiratory: clear to auscultation bilaterally, no wheezing, no crackles. Normal respiratory effort. Cardiovascular: Regular rate and rhythm, no murmurs, No extremity edema. 2+ pedal pulses Abdomen: no tenderness, no masses palpated. No hepatosplenomegaly. Bowel sounds positive.  Musculoskeletal: no clubbing / cyanosis. No joint deformity upper and lower extremities.  Skin: no rashes, lesions, ulcers. Neurologic: CN 2-12 grossly intact.  Psychiatric: Normal judgment and insight. Alert and oriented x 3. Normal mood.    Labs on Admission: I have personally reviewed following labs and imaging studies  CBC:  Recent Labs Lab 05/28/16 1605 05/29/16 0304  05/29/16 0900 05/30/16 0500 05/31/16 0516 06/03/16 1328  WBC 10.5  --  9.3 7.2 6.6 13.2*  NEUTROABS  --   --  5.2  --  3.7 10.3*  HGB 7.7* 6.6* 7.7* 7.3* 7.9* 5.1*  HCT 22.2* 18.9* 22.1* 21.5* 23.5* 16.0*  MCV 84.1  --  84.4 86.7 88.7 92.5  PLT 178  --  148* 123* 134* 324   Basic Metabolic Panel:  Recent Labs Lab 05/28/16 1605 05/29/16 0304 05/29/16 0900 05/30/16 0500 05/31/16 0516 06/03/16 1328  NA 139  --  139 139 140 139  K 5.7* 5.2* 5.1 4.9 4.7 4.6  CL 117*  --  115* 114* 113* 112*  CO2 19*  --  20* 22 23 20*  GLUCOSE 172*  --  168* 123* 133* 264*  BUN 37*  --  35* 24* 21* 24*  CREATININE 1.96*  --  1.72* 1.32* 1.43* 1.51*  CALCIUM 7.2*  --  7.5* 7.9* 8.4* 7.6*  MG 1.3*  --  1.3*  --   --   --   PHOS 3.5  --   --   --   --   --    GFR: Estimated Creatinine Clearance: 37.5 mL/min (A) (by C-G formula based on SCr of 1.51 mg/dL (H)). Liver Function Tests:  Recent Labs Lab 05/27/16 1954 05/28/16 1605 05/30/16 0500 06/03/16 1328  AST 33 20  --  25  ALT 13* 15*  --  14*  ALKPHOS 41 31*  --  40  BILITOT 1.1 0.5  --  0.7  PROT 5.6* 4.3*  --  5.2*  ALBUMIN 2.6* 2.3* 2.3* 2.5*   No results for input(s): LIPASE, AMYLASE in the last 168 hours. No results for input(s): AMMONIA in the last 168 hours. Coagulation Profile:  Recent Labs Lab 05/27/16 1954 05/28/16 1605 06/03/16 1328  INR 1.16 1.26 1.22   Cardiac Enzymes:  Recent Labs Lab 05/28/16 1605 05/29/16 0900  TROPONINI <0.03 0.03*   BNP (last 3 results) No results for input(s): PROBNP in the last 8760 hours. HbA1C: No results for input(s): HGBA1C in the last 72 hours. CBG:  Recent Labs Lab 05/30/16 1624 05/30/16 2013 05/31/16 0035 05/31/16 0406 05/31/16 0728  GLUCAP 127* 170* 132* 117* 131*   Lipid Profile: No results for input(s): CHOL, HDL, LDLCALC, TRIG, CHOLHDL, LDLDIRECT in the last 72 hours. Thyroid Function Tests: No results for input(s): TSH, T4TOTAL, FREET4, T3FREE, THYROIDAB in  the last 72 hours. Anemia Panel: No results for input(s): VITAMINB12, FOLATE, FERRITIN, TIBC, IRON, RETICCTPCT in the last 72 hours. Urine analysis:    Component Value Date/Time   COLORURINE YELLOW 03/23/2014 1016   APPEARANCEUR CLEAR 03/23/2014 1016   LABSPEC 1.015 03/23/2014 1016   PHURINE 5.5 03/23/2014 1016   GLUCOSEU >=1000 (A) 03/23/2014 1016   HGBUR NEGATIVE 03/23/2014 1016   BILIRUBINUR NEGATIVE 03/23/2014 1016   KETONESUR NEGATIVE 03/23/2014 1016   UROBILINOGEN 0.2 03/23/2014 1016   NITRITE NEGATIVE 03/23/2014 1016   LEUKOCYTESUR NEGATIVE 03/23/2014 1016    Recent Results (from the past 240 hour(s))  MRSA PCR Screening     Status: None   Collection Time: 05/28/16  2:59 AM  Result Value Ref Range Status   MRSA by PCR NEGATIVE NEGATIVE Final    Comment:        The GeneXpert MRSA Assay (FDA approved for NASAL specimens only), is one component of a comprehensive MRSA colonization surveillance program. It is not intended to diagnose MRSA infection nor to guide or monitor treatment for MRSA infections.   Culture, blood (Routine X 2) w Reflex to ID Panel     Status: None   Collection Time: 05/28/16  4:16 PM  Result Value Ref Range Status   Specimen Description BLOOD BLOOD RIGHT ARM  Final   Special Requests BOTTLES DRAWN AEROBIC AND ANAEROBIC 7 CC EA  Final   Culture   Final    NO GROWTH 5 DAYS Performed at [facility], [place] [address] [place], [place] [address]    Report Status 06/02/2016 FINAL  Final  Culture, blood (Routine X 2) w Reflex to ID Panel     Status: None   Collection Time: 05/28/16  4:26 PM  Result Value Ref Range Status   Specimen Description BLOOD BLOOD LEFT HAND  Final   Special Requests BOTTLES DRAWN AEROBIC AND ANAEROBIC 5.5 CC EA  Final   Culture   Final  NO GROWTH 5 DAYS Performed at [facility], [place] [address] [place], [place] [address]    Report Status 06/02/2016 FINAL  Final     Radiological Exams on Admission: Dg  Chest Portable 1 View  Result Date: 06/03/2016 CLINICAL DATA:  83 year old male with the vagal episodes. Subsequent encounter. EXAM: PORTABLE CHEST 1 VIEW COMPARISON:  05/28/2016 chest x-ray FINDINGS: Cardiomegaly. Calcified tortuous aorta. No infiltrate, congestive heart failure or pneumothorax. No plain film evidence of pulmonary malignancy. Right central line removed. IMPRESSION: Cardiomegaly. Calcified tortuous aorta. No infiltrate or congestive heart failure. Electronically Signed   By: Genia Del M.D.   On: 06/03/2016 14:47    EKG: Independently reviewed sinus rhythm   Assessment/Plan Lower GI due to diverticulosis - ? If re-bleeding from previous coil placed on 05/28/16 Anemia of acute blood loss  Admit to SDU  Transfuse 3 units of PRBC's, keep 2 units ahead  IVF resuscitation  GI, IR and surgery consulted appreciate recommendations - No angiography at this time since patient had 2 in prior admission and there is concern for contrast induce nephropathy. GI does not recommend colonoscopy for active lower GI bleed.  Serial CBC every 4 hrs  NPO   Acute on CKD III - likely due to hypoperfusion Baseline Cr 1.37 IVF, Monitor CBC in AM   DM type 2 Hold home medication  Monitor CGB's  SSI   Hypothyroidism  Continue Levothyroxine 125 mcg daily    DVT prophylaxis: SCD's  Code Status: FULL  Family Communication: Son at bedside  Disposition Plan: Anticipate discharge to previous home environment.  Consults called: Surgery, GI, IR  Admission status: Inpatient/SDU   Chipper Oman MD [facility] Pager: Text Page via [email or web address]  [phone]  If 7PM-7AM, please contact night-coverage [email or web address] Password [email or web address]  06/03/2016, 5:25 PM

## 2016-06-03 NOTE — ED Notes (Signed)
Report given to ICU RN. Patient taken on monitor to ICU by RN

## 2016-06-03 NOTE — ED Provider Notes (Addendum)
[facility] DEPT Provider Note   CSN: [id number] Arrival date & time: 06/03/16  1234     History   Chief Complaint Syncope  HPI Zachary Cook is a 83 y.o. male.  HPI Pt has a history of recent GI bleeding. He was in the hospital recently and treated for that.  He was discharged Friday afternoon.  Yesterday he noticed some blood in the stool. He started to feel a little weak but was able to get up. Pt went to stand up today and he felt weak as if he was going to pass out.  He was not able to stand. He called EMS.  In the ED he had a large bloody bowel movement.  No CP.  No abd pain.  No SOB.  Past Medical History:  Diagnosis Date  . Barrett's esophagus   . Diabetes mellitus without complication ([place])   . Diverticulosis   . Duodenitis   . GERD (gastroesophageal reflux disease)   . Hiatal hernia     Patient Active Problem List   Diagnosis Date Noted  . Diverticulosis of colon with hemorrhage   . Hematochezia   . Lower GI bleed   . BRBPR (bright red blood per rectum) 05/27/2016  . Acute blood loss anemia 05/27/2016  . Hypotension due to blood loss 05/27/2016  . AKI (acute kidney injury) ([place]) 05/27/2016  . Hyperkalemia 05/27/2016  . Adult onset hypothyroidism 12/20/2013  . Type II diabetes mellitus, uncontrolled ([place]) 12/20/2013  . Hypogonadism in male 12/02/2013  . Type 2 diabetes mellitus ([place]) 05/09/2011  . Hypertension 05/09/2011  . GERD (gastroesophageal reflux disease) 05/09/2011  . Hypogonadism male 05/09/2011    Past Surgical History:  Procedure Laterality Date  . IR GENERIC HISTORICAL  05/28/2016   IR ANGIOGRAM SELECTIVE EACH ADDITIONAL VESSEL 05/28/2016 Aletta Edouard, MD [facility]  . IR GENERIC HISTORICAL  05/28/2016   IR ANGIOGRAM VISCERAL SELECTIVE 05/28/2016 Aletta Edouard, MD [facility]  . IR GENERIC HISTORICAL  05/28/2016   IR US GUIDE VASC ACCESS RIGHT 05/28/2016 Aletta Edouard, MD [facility]  . IR GENERIC HISTORICAL  05/28/2016   IR  ANGIOGRAM VISCERAL SELECTIVE 05/28/2016 Arne Cleveland, MD [facility]  . IR GENERIC HISTORICAL  05/28/2016   IR ANGIOGRAM SELECTIVE EACH ADDITIONAL VESSEL 05/28/2016 Arne Cleveland, MD [facility]  . IR GENERIC HISTORICAL  05/28/2016   IR EMBO ART  VEN HEMORR LYMPH EXTRAV  INC GUIDE ROADMAPPING 05/28/2016 Arne Cleveland, MD [facility]  . IR GENERIC HISTORICAL  05/28/2016   IR US GUIDE VASC ACCESS RIGHT 05/28/2016 Arne Cleveland, MD [facility]  . VASECTOMY         Home Medications    Prior to Admission medications   Medication Sig Start Date End Date Taking? Authorizing Provider  aspirin 81 MG tablet Take 81 mg by mouth daily.    Historical Provider, MD  B-D UF III MINI PEN NEEDLES 31G X 5 MM MISC USE ONE PER DAY WITH VICTOZA 11/22/15   Elayne Snare, MD  Blood Glucose Monitoring Suppl (ACCU-CHEK NANO SMARTVIEW) W/DEVICE KIT  12/02/13   Historical Provider, MD  esomeprazole (NEXIUM) 40 MG capsule TAKE ONE CAPSULE BY MOUTH DAILY AT NOON 04/14/16   [name], MD  ferrous sulfate 325 (65 FE) MG tablet Take 1 tablet (325 mg total) by mouth 3 (three) times daily with meals. 05/31/16   Doreatha Lew, MD  FLUAD 0.5 ML SUSY  11/30/15   Historical Provider, MD  glimepiride (AMARYL) 2 MG tablet  TAKE 2 TABLETS(4 MG) BY MOUTH DAILY BEFORE DINNER Patient taking differently: take '2mg'$  by mouth with dinner 04/05/16   Elayne Snare, MD  glucose blood (ACCU-CHEK SMARTVIEW) test strip Test blood sugar twice daily 04/10/16   Elayne Snare, MD  Insulin Glargine (TOUJEO SOLOSTAR) 300 UNIT/ML SOPN Inject 12 Units into the skin daily. 04/10/16   Elayne Snare, MD  levothyroxine (SYNTHROID, LEVOTHROID) 125 MCG tablet Take 1 tablet (125 mcg total) by mouth daily before breakfast. 05/07/16   Elayne Snare, MD  liraglutide (VICTOZA) 18 MG/3ML SOPN ADMINISTER 1.2 MG UNDER THE SKIN DAILY 05/07/16   Elayne Snare, MD  metFORMIN (GLUCOPHAGE-XR) 750 MG 24 hr tablet TAKE 2 TABLETS(1500 MG) BY MOUTH DAILY WITH BREAKFAST 05/23/16   Elayne Snare, MD  simvastatin (ZOCOR) 40 MG tablet TAKE 1 TABLET(40 MG) BY MOUTH AT BEDTIME 02/04/16   Chelle Jeffery, PA-C  tamsulosin (FLOMAX) 0.4 MG CAPS capsule TAKE 1 CAPSULE BY MOUTH EVERY DAY 03/24/16   [name], MD    Family History Family History  Problem Relation Age of Onset  . Heart disease Mother   . Diabetes Mother   . Cancer Father     Social History Social History  Substance Use Topics  . Smoking status: Never Smoker  . Smokeless tobacco: Never Used  . Alcohol use No     Allergies   Patient has no known allergies.   Review of Systems Review of Systems  All other systems reviewed and are negative.    Physical Exam Updated Vital Signs BP 114/68   Pulse 92   Temp 97.9 F (36.6 C) (Oral)   Resp 18   Ht '5\' 7"'$  (1.702 m)   Wt 79.4 kg   SpO2 100%   BMI 27.41 kg/m   Physical Exam  Constitutional: No distress.  HENT:  Head: Normocephalic and atraumatic.  Right Ear: External ear normal.  Left Ear: External ear normal.  Eyes: Conjunctivae are normal. Right eye exhibits no discharge. Left eye exhibits no discharge. No scleral icterus.  Neck: Neck supple. No tracheal deviation present.  Cardiovascular: Normal rate, regular rhythm and intact distal pulses.   Pulmonary/Chest: Effort normal and breath sounds normal. No stridor. No respiratory distress. He has no wheezes. He has no rales.  Abdominal: Soft. Bowel sounds are normal. He exhibits no distension. There is no tenderness. There is no rebound and no guarding.  Genitourinary:  Genitourinary Comments: Bloody BM changed by EMT  Musculoskeletal: He exhibits no edema or tenderness.  Neurological: He is alert. He has normal strength. No cranial nerve deficit (no facial droop, extraocular movements intact, no slurred speech) or sensory deficit. He exhibits normal muscle tone. He displays no seizure activity. Coordination normal.  Skin: No rash noted. He is not diaphoretic. There is pallor.  Psychiatric: He  has a normal mood and affect.  Nursing note and vitals reviewed.    ED Treatments / Results  Labs (all labs ordered are listed, but only abnormal results are displayed) Labs Reviewed  COMPREHENSIVE METABOLIC PANEL - Abnormal; Notable for the following:       Result Value   Chloride 112 (*)    CO2 20 (*)    Glucose, Bld 264 (*)    BUN 24 (*)    Creatinine, Ser 1.51 (*)    Calcium 7.6 (*)    Total Protein 5.2 (*)    Albumin 2.5 (*)    ALT 14 (*)    GFR calc non Af Amer 42 (*)  GFR calc Af Amer 48 (*)    All other components within normal limits  CBC WITH DIFFERENTIAL/PLATELET - Abnormal; Notable for the following:    WBC 13.2 (*)    RBC 1.73 (*)    Hemoglobin 5.1 (*)    HCT 16.0 (*)    RDW 17.9 (*)    Neutro Abs 10.3 (*)    All other components within normal limits  PROTIME-INR - Abnormal; Notable for the following:    Prothrombin Time 15.4 (*)    All other components within normal limits  APTT  TYPE AND SCREEN  PREPARE RBC (CROSSMATCH)   Procedures .Critical Care Performed by: Dor[facility] Authorized by: [facility]   Critical care provider statement:    Critical care time (minutes):  35   Critical care was necessary to treat or prevent imminent or life-threatening deterioration of the following conditions: gi bleeding.   Critical care was time spent personally by me on the following activities:  Discussions with consultants, evaluation of patient's response to treatment, examination of patient, ordering and performing treatments and interventions, ordering and review of laboratory studies, ordering and review of radiographic studies, pulse oximetry, re-evaluation of patient's condition, obtaining history from patient or surrogate and review of old charts    (including critical care time)  Medications Ordered in ED Medications  0.9 %  sodium chloride infusion (125 mL/hr Intravenous New Bag/Given 06/03/16 1336)  0.9 %  sodium chloride infusion (10 mL/hr Intravenous  New Bag/Given 06/03/16 1451)     Initial Impression / Assessment and Plan / ED Course  I have reviewed the triage vital signs and the nursing notes.  Pertinent labs & imaging results that were available during my care of the patient were reviewed by me and considered in my medical decision making (see chart for details).  Clinical Course as of Jun 03 1500  Mon Jun 03, 2016  1429 Discussed with Dr Jarvis Newcomer.  No further intervention from and IR standpoint.  I will consult with GI and arrange for admission  [JK]  1501 D/w GI, Tye Savoy.  Will consult on patient.   Discussed with Dr Quincy Simmonds.  Will consult with GI to see if patient should be on medical or surgical service  [JK]    Clinical Course User Index [JK] [facility], MD   Patient presents to the emergency room for a recurrent GI bleed. He was recently in the hospital for this and had coil embolization of what was thought to be the culprit vessel.  Patient started having increasing weakness weekend and then a syncopal episode today. His hemoglobin has dropped down to 5.1.  Slight increase in his glucose as well as his creatinine.  I have ordered blood transfusions. I will consult with gastroenterology. I will arrange for hospitalization and admission.  I discussed the findings and the planned with the patient and the family. We'll continue to monitor closely. His blood pressure and stable right now at 120/63  Final Clinical Impressions(s) / ED Diagnoses   Final diagnoses:  Bright red rectal bleeding  Acute blood loss anemia    New Prescriptions New Prescriptions   No medications on file       Dorie [facility], MD 06/03/16 1502

## 2016-06-04 DIAGNOSIS — I10 Essential (primary) hypertension: Secondary | ICD-10-CM

## 2016-06-04 DIAGNOSIS — R030 Elevated blood-pressure reading, without diagnosis of hypertension: Secondary | ICD-10-CM

## 2016-06-04 LAB — BASIC METABOLIC PANEL
ANION GAP: 4 — AB (ref 5–15)
BUN: 25 mg/dL — ABNORMAL HIGH (ref 6–20)
CALCIUM: 7.3 mg/dL — AB (ref 8.9–10.3)
CHLORIDE: 114 mmol/L — AB (ref 101–111)
CO2: 23 mmol/L (ref 22–32)
CREATININE: 1.04 mg/dL (ref 0.61–1.24)
GFR calc non Af Amer: >60 mL/min (ref >60)
Glucose, Bld: 99 mg/dL (ref 65–99)
Potassium: 3.3 mmol/L — ABNORMAL LOW (ref 3.5–5.1)
SODIUM: 141 mmol/L (ref 135–145)

## 2016-06-04 LAB — HEMOGLOBIN AND HEMATOCRIT, BLOOD
HCT: 21.2 % — ABNORMAL LOW (ref 39.0–52.0)
HCT: 25.5 % — ABNORMAL LOW (ref 39.0–52.0)
HEMATOCRIT: 27 % — AB (ref 39.0–52.0)
HEMATOCRIT: 24 % — AB (ref 39.0–52.0)
HEMOGLOBIN: 7.2 g/dL — AB (ref 13.0–17.0)
HEMOGLOBIN: 8.4 g/dL — AB (ref 13.0–17.0)
HEMOGLOBIN: 9.5 g/dL — AB (ref 13.0–17.0)
Hemoglobin: 8.9 g/dL — ABNORMAL LOW (ref 13.0–17.0)

## 2016-06-04 LAB — COMPREHENSIVE METABOLIC PANEL
ALBUMIN: 2.3 g/dL — AB (ref 3.5–5.0)
ALK PHOS: 43 U/L (ref 38–126)
ALT: 13 U/L — AB (ref 17–63)
AST: 19 U/L (ref 15–41)
Anion gap: 4 — ABNORMAL LOW (ref 5–15)
BUN: 26 mg/dL — ABNORMAL HIGH (ref 6–20)
CALCIUM: 7.2 mg/dL — AB (ref 8.9–10.3)
CHLORIDE: 114 mmol/L — AB (ref 101–111)
CO2: 22 mmol/L (ref 22–32)
CREATININE: 1.01 mg/dL (ref 0.61–1.24)
GFR calc Af Amer: >60 mL/min (ref >60)
GFR calc non Af Amer: >60 mL/min (ref >60)
GLUCOSE: 102 mg/dL — AB (ref 65–99)
Potassium: 3.3 mmol/L — ABNORMAL LOW (ref 3.5–5.1)
Sodium: 140 mmol/L (ref 135–145)
Total Bilirubin: 0.9 mg/dL (ref 0.3–1.2)
Total Protein: 4.9 g/dL — ABNORMAL LOW (ref 6.5–8.1)

## 2016-06-04 LAB — GLUCOSE, CAPILLARY
GLUCOSE-CAPILLARY: 128 mg/dL — AB (ref 65–99)
GLUCOSE-CAPILLARY: 153 mg/dL — AB (ref 65–99)
Glucose-Capillary: 130 mg/dL — ABNORMAL HIGH (ref 65–99)
Glucose-Capillary: 100 mg/dL — ABNORMAL HIGH (ref 65–99)
Glucose-Capillary: 128 mg/dL — ABNORMAL HIGH (ref 65–99)
Glucose-Capillary: 90 mg/dL (ref 65–99)
Glucose-Capillary: 105 mg/dL — ABNORMAL HIGH (ref 65–99)

## 2016-06-04 MED ORDER — SODIUM CHLORIDE 0.9 % IV SOLN
Freq: Once | INTRAVENOUS | Status: AC
  Administered 2016-06-04: 06:00:00 via INTRAVENOUS

## 2016-06-04 MED ORDER — INSULIN ASPART 100 UNIT/ML ~~LOC~~ SOLN
0.0000 [IU] | SUBCUTANEOUS | Status: DC
  Administered 2016-06-04: 3 [IU] via SUBCUTANEOUS
  Administered 2016-06-04: 2 [IU] via SUBCUTANEOUS
  Administered 2016-06-04: 3 [IU] via SUBCUTANEOUS
  Administered 2016-06-04 – 2016-06-05 (×4): 2 [IU] via SUBCUTANEOUS
  Administered 2016-06-05: 3 [IU] via SUBCUTANEOUS
  Administered 2016-06-05: 2 [IU] via SUBCUTANEOUS
  Administered 2016-06-06: 3 [IU] via SUBCUTANEOUS
  Administered 2016-06-06: 5 [IU] via SUBCUTANEOUS
  Administered 2016-06-06: 3 [IU] via SUBCUTANEOUS
  Administered 2016-06-07: 2 [IU] via SUBCUTANEOUS
  Administered 2016-06-07: 3 [IU] via SUBCUTANEOUS
  Administered 2016-06-07: 2 [IU] via SUBCUTANEOUS
  Administered 2016-06-07 (×2): 3 [IU] via SUBCUTANEOUS
  Administered 2016-06-08: 2 [IU] via SUBCUTANEOUS
  Administered 2016-06-08: 3 [IU] via SUBCUTANEOUS
  Administered 2016-06-08 – 2016-06-09 (×4): 2 [IU] via SUBCUTANEOUS
  Administered 2016-06-09 (×2): 3 [IU] via SUBCUTANEOUS
  Administered 2016-06-09: 8 [IU] via SUBCUTANEOUS
  Administered 2016-06-09: 5 [IU] via SUBCUTANEOUS
  Administered 2016-06-10 – 2016-06-11 (×7): 3 [IU] via SUBCUTANEOUS
  Administered 2016-06-11: 2 [IU] via SUBCUTANEOUS
  Administered 2016-06-11: 3 [IU] via SUBCUTANEOUS
  Administered 2016-06-11: 2 [IU] via SUBCUTANEOUS
  Administered 2016-06-11 – 2016-06-12 (×3): 3 [IU] via SUBCUTANEOUS
  Administered 2016-06-12 – 2016-06-14 (×9): 2 [IU] via SUBCUTANEOUS
  Administered 2016-06-14: 5 [IU] via SUBCUTANEOUS
  Administered 2016-06-14 (×2): 3 [IU] via SUBCUTANEOUS
  Administered 2016-06-15: 2 [IU] via SUBCUTANEOUS
  Administered 2016-06-15 (×2): 5 [IU] via SUBCUTANEOUS
  Administered 2016-06-15: 2 [IU] via SUBCUTANEOUS
  Administered 2016-06-15: 5 [IU] via SUBCUTANEOUS
  Administered 2016-06-16: 3 [IU] via SUBCUTANEOUS
  Administered 2016-06-16: 5 [IU] via SUBCUTANEOUS
  Administered 2016-06-16: 3 [IU] via SUBCUTANEOUS

## 2016-06-04 MED ORDER — ORAL CARE MOUTH RINSE
15.0000 mL | Freq: Two times a day (BID) | OROMUCOSAL | Status: DC
  Administered 2016-06-05 – 2016-06-16 (×19): 15 mL via OROMUCOSAL

## 2016-06-04 MED ORDER — SODIUM CHLORIDE 0.9 % IV SOLN
Freq: Once | INTRAVENOUS | Status: AC
  Administered 2016-06-04: 05:00:00 via INTRAVENOUS

## 2016-06-04 MED ORDER — FUROSEMIDE 10 MG/ML IJ SOLN
40.0000 mg | Freq: Once | INTRAMUSCULAR | Status: AC
  Administered 2016-06-04: 40 mg via INTRAVENOUS
  Filled 2016-06-04: qty 4

## 2016-06-04 MED ORDER — FUROSEMIDE 10 MG/ML IJ SOLN
20.0000 mg | Freq: Once | INTRAMUSCULAR | Status: AC
  Administered 2016-06-04: 20 mg via INTRAVENOUS
  Filled 2016-06-04: qty 2

## 2016-06-04 MED ORDER — INSULIN ASPART 100 UNIT/ML ~~LOC~~ SOLN: 0.0000 [IU] | SUBCUTANEOUS | Status: DC

## 2016-06-04 NOTE — Progress Notes (Addendum)
PROGRESS NOTE [facility]   Zachary Cook   JOA:[id number] DOB: 1932/06/25  DOA: 06/03/2016 PCP: No PCP Per Patient   Brief Narrative:  Zachary Cook is a 83 y.o. male with medical history significant of T2DM, Barrett's and diverticulosis who presented to the emergency room complaining of weakness and feeling that he was going to pass out. Patient was recently discharged after being treated for GI bleed with coil embolization of the distal branch of SMA on 05/28/2016. In the emergency room patient had a large bloody bowel movement and he was found to have hemoglobin of 5.1. Admitted for GI bleed and surgical evaluation.    Subjective: Patient seen and examined with wife at bedside. Patient have no complains. Last bloody BM early this morning. Now on his 5th unit of PRBC's. Hgb went up only to 7.1 after 3 units. Denies chest pain, SOB and palpitations    Assessment & Plan: Lower GI due to diverticulosis - ? If re-bleeding from previous coil, placed on 05/28/16 Anemia of acute blood loss  s/p 4 units of PRBC's s/p 1 unit FFP - 1 more to go  Continue IVF for now  GI, IR and surgery consulted appreciate recommendations - No angiography at this time since patient had 2 in prior admission and there is concern for contrast induce nephropathy. GI does not recommend colonoscopy for active lower GI bleed. Surgery considering surgery Serial CBC every 4 hrs - Continue to monitor  Keep NPO   Acute on CKD III - likely due to hypoperfusion Baseline Cr 1.37 Continue IVF Labs pending due to ongoing transfusion.   Elevated BP - w/o diagnosis of HTN - likely due to high volume transfusion  Will decrease IVF rate to 75 cc/hr One dose of Lasix given between transfusion, will add another dose after last unit  DM type 2 Continue to hold home medication  Monitor CGB's  SSI   Hypothyroidism  Continue Levothyroxine 125 mcg daily   DVT prophylaxis: SCD's  Code Status: FULL  Family  Communication: Wife at bedside  Disposition Plan: TBD patient with active GI bleed, pending surgical evaluation  Consultants:   GI  Surgery   IR  Procedures:   None    Objective: Vitals:   06/04/16 0700 06/04/16 0800 06/04/16 0830 06/04/16 0835  BP: (!) 157/66 (!) 184/67  (!) 181/94  Pulse: (!) 58 61  64  Resp: (!) 21 17  19   Temp:   (P) 97.8 F (36.6 C)   TempSrc:   (P) Oral   SpO2: 94% 100%    Weight:      Height:        Intake/Output Summary (Last 24 hours) at 06/04/16 0841 Last data filed at 06/04/16 0645  Gross per 24 hour  Intake             3320 ml  Output              225 ml  Net             3095 ml   Filed Weights   06/03/16 1243 06/03/16 1630  Weight: 79.4 kg (175 lb) 77.1 kg (169 lb 15.6 oz)    Examination:  General exam: Appears calm and comfortable HEENT: Pale conjunctiva, OP moist  Respiratory system: Anterior clear to auscultation. No wheezes,crackle or rhonchi Cardiovascular system: S1 & S2 heard, RRR. No JVD, murmurs, rubs or gallops Gastrointestinal system: Abdomen is nondistended, soft and nontender. Central nervous system: Alert and oriented.  No focal neurological deficits. Extremities: No pedal edema.  Skin: Skin  Psychiatry: Judgement and insight appear normal. Mood & affect appropriate.    Data Reviewed: I have personally reviewed following labs and imaging studies  CBC:  Recent Labs Lab 05/28/16 1605  05/29/16 0900 05/30/16 0500 05/31/16 0516 06/03/16 1328 06/04/16 0254  WBC 10.5  --  9.3 7.2 6.6 13.2*  --   NEUTROABS  --   --  5.2  --  3.7 10.3*  --   HGB 7.7*  < > 7.7* 7.3* 7.9* 5.1* 7.2*  HCT 22.2*  < > 22.1* 21.5* 23.5* 16.0* 21.2*  MCV 84.1  --  84.4 86.7 88.7 92.5  --   PLT 178  --  148* 123* 134* 173  --   < > = values in this interval not displayed. Basic Metabolic Panel:  Recent Labs Lab 05/28/16 1605 05/29/16 0304 05/29/16 0900 05/30/16 0500 05/31/16 0516 06/03/16 1328  NA 139  --  139 139 140 139    K 5.7* 5.2* 5.1 4.9 4.7 4.6  CL 117*  --  115* 114* 113* 112*  CO2 19*  --  20* 22 23 20*  GLUCOSE 172*  --  168* 123* 133* 264*  BUN 37*  --  35* 24* 21* 24*  CREATININE 1.96*  --  1.72* 1.32* 1.43* 1.51*  CALCIUM 7.2*  --  7.5* 7.9* 8.4* 7.6*  MG 1.3*  --  1.3*  --   --   --   PHOS 3.5  --   --   --   --   --    GFR: Estimated Creatinine Clearance: 37.5 mL/min (A) (by C-G formula based on SCr of 1.51 mg/dL (H)). Liver Function Tests:  Recent Labs Lab 05/28/16 1605 05/30/16 0500 06/03/16 1328  AST 20  --  25  ALT 15*  --  14*  ALKPHOS 31*  --  40  BILITOT 0.5  --  0.7  PROT 4.3*  --  5.2*  ALBUMIN 2.3* 2.3* 2.5*   Coagulation Profile:  Recent Labs Lab 05/28/16 1605 06/03/16 1328  INR 1.26 1.22   Cardiac Enzymes:  Recent Labs Lab 05/28/16 1605 05/29/16 0900  TROPONINI <0.03 0.03*   CBG:  Recent Labs Lab 05/31/16 0728 06/03/16 1935 06/03/16 2310 06/04/16 0403 06/04/16 0754  GLUCAP 131* 192* 175* 130* 100*    Recent Labs Lab 05/28/16 1605 05/29/16 0900 05/30/16 0500  PROCALCITON 0.25 0.18 2.68  LATICACIDVEN 0.8  --   --     Recent Results (from the past 240 hour(s))  MRSA PCR Screening     Status: None   Collection Time: 05/28/16  2:59 AM  Result Value Ref Range Status   MRSA by PCR NEGATIVE NEGATIVE Final    Comment:        The GeneXpert MRSA Assay (FDA approved for NASAL specimens only), is one component of a comprehensive MRSA colonization surveillance program. It is not intended to diagnose MRSA infection nor to guide or monitor treatment for MRSA infections.   Culture, blood (Routine X 2) w Reflex to ID Panel     Status: None   Collection Time: 05/28/16  4:16 PM  Result Value Ref Range Status   Specimen Description BLOOD BLOOD RIGHT ARM  Final   Special Requests BOTTLES DRAWN AEROBIC AND ANAEROBIC 7 CC EA  Final   Culture   Final    NO GROWTH 5 DAYS Performed at [facility], [place],  [place] [address]     Report Status 06/02/2016 FINAL  Final  Culture, blood (Routine X 2) w Reflex to ID Panel     Status: None   Collection Time: 05/28/16  4:26 PM  Result Value Ref Range Status   Specimen Description BLOOD BLOOD LEFT HAND  Final   Special Requests BOTTLES DRAWN AEROBIC AND ANAEROBIC 5.5 CC EA  Final   Culture   Final    NO GROWTH 5 DAYS Performed at [facility], [place] [address] [place], [place] [address]    Report Status 06/02/2016 FINAL  Final     Radiology Studies: Dg Chest Portable 1 View  Result Date: 06/03/2016 CLINICAL DATA:  83 year old male with the vagal episodes. Subsequent encounter. EXAM: PORTABLE CHEST 1 VIEW COMPARISON:  05/28/2016 chest x-ray FINDINGS: Cardiomegaly. Calcified tortuous aorta. No infiltrate, congestive heart failure or pneumothorax. No plain film evidence of pulmonary malignancy. Right central line removed. IMPRESSION: Cardiomegaly. Calcified tortuous aorta. No infiltrate or congestive heart failure. Electronically Signed   By: Genia Del M.D.   On: 06/03/2016 14:47    Scheduled Meds: . Chlorhexidine Gluconate Cloth  6 each Topical Daily  . insulin aspart  0-15 Units Subcutaneous Q4H  . levothyroxine  125 mcg Oral QAC breakfast  . sodium chloride flush  10-40 mL Intracatheter Q12H   Continuous Infusions: . sodium chloride Stopped (06/03/16 1608)     LOS: 1 day    Chipper Oman, MD Pager: Text Page via [email or web address]  [phone]  If 7PM-7AM, please contact night-coverage [email or web address] Password [facility] 06/04/2016, 8:41 AM

## 2016-06-04 NOTE — Progress Notes (Signed)
Patients wife irate about patient not eating, attempted to explain reasoning for bowel rest but she yelled at staff and walked away, MD paged for any updates on diet and to see if able to update family. Will continue to monitor and attempt patient and family education.

## 2016-06-04 NOTE — Progress Notes (Signed)
     [facility] Progress Note  Chief Complaint:  GI bleeding  Subjective: He feels okay. No SOB.   Objective:  Vital signs in last 24 hours: Temp:  [97.6 F (36.4 C)-99.4 F (37.4 C)] 97.8 F (36.6 C) (03/27 0850) Pulse Rate:  [58-104] 62 (03/27 0915) Resp:  [15-29] 18 (03/27 0915) BP: (81-184)/(54-131) 166/131 (03/27 0900) SpO2:  [92 %-100 %] 99 % (03/27 0915) Weight:  [169 lb 15.6 oz (77.1 kg)-175 lb (79.4 kg)] 169 lb 15.6 oz (77.1 kg) (03/26 1630) Last BM Date: 06/04/16 General:   Alert, well-developed,  White male in NAD EENT:  Normal hearing, non icteric sclera, conjunctive pink.  Heart:  Regular rate and rhythm Pulm: Normal respiratory effort Abdomen:  Soft, nondistended, nontender.  Normal bowel sounds, no masses felt. No hepatomegaly.    Neurologic:  Alert and  oriented x4;  grossly normal neurologically. Psych:  Alert and cooperative. Normal mood and affect.   Intake/Output from previous day: 03/26 0701 - 03/27 0700 In: 3320 [I.V.:1720; Blood:1600] Out: 225 [Urine:225] Intake/Output this shift: Total I/O In: 20 [I.V.:20] Out: 500 [Urine:500]  Lab Results:  Recent Labs  06/03/16 1328 06/04/16 0254  WBC 13.2*  --   HGB 5.1* 7.2*  HCT 16.0* 21.2*  PLT 173  --    BMET  Recent Labs  06/03/16 1328  NA 139  K 4.6  CL 112*  CO2 20*  GLUCOSE 264*  BUN 24*  CREATININE 1.51*  CALCIUM 7.6*   LFT  Recent Labs  06/03/16 1328  PROT 5.2*  ALBUMIN 2.5*  AST 25  ALT 14*  ALKPHOS 40  BILITOT 0.7   PT/INR  Recent Labs  06/03/16 1328  LABPROT 15.4*  INR 1.22    Assessment / Plan:   83 yo male admitted with recurrent hematochezia and severe ABL. He is s/p coil embolization of a distal branch of SMA on 3/20. Spoke with RN, patient had a large bloody BM around 4am, none since.  -Hgb was only up 2 grams  To 7.2 after 3 units of blood last night. Another 2 units were orders and currently getting 2/2 which will put him at 5 units  total.  Getting 1/2 units of FFP as well.  -Surgery concerned about doing a right hemicolectomy especially since bleeding could be small bowel.  -We can do a diagnostic colonoscopy if / when bleeding stops. Obviously he will need emergent IR or surgical management otherwise. Will continue to follow.  .  Active Problems:   GI bleed   Syncope    LOS: 1 day   Tye Savoy NP 06/04/2016, 11:14 AM  Pager number [phone]    Attending physician's note   I have taken an interval history, reviewed the chart and examined the patient. I agree with the Advanced Practitioner's note, impression and recommendations. Severe LGI bleed and severe ABL anemia. Total of 5U PRBCs so far. Colonoscopy can be performed if/when bleeding has stopped for diagnostic purposes. Colonoscopy is not helpful during active LGI bleeding.   IR and general surgery following for mgmt of active LGI bleeding.   [name], MD [name] [phone] Mon-Fri 8a-5p [phone] after 5p, weekends, holidays

## 2016-06-04 NOTE — Care Management Note (Signed)
Case Management Note  Patient Details  Name: Zachary Cook MRN: [id number] Date of Birth: [birth year]  Subjective/Objective:      Anemia and gi bleed              Action/Plan: Date:  June 04, 2016 Chart reviewed for concurrent status and case management needs. Will continue to follow patient progress. Discharge Planning: following for needs Expected discharge date: [id number] Velva Harman, BSN, [place], [place]  Expected Discharge Date:   (unknown)               Expected Discharge Plan:  Home/Self Care  In-House Referral:     Discharge planning Services     Post Acute Care Choice:    Choice offered to:     DME Arranged:    DME Agency:     HH Arranged:    [place] Agency:     Status of Service:  In process, will continue to follow  If discussed at Long Length of Stay Meetings, dates discussed:    Additional Comments:  [name], RN 06/04/2016, 11:42 AM

## 2016-06-04 NOTE — Progress Notes (Signed)
  Subjective: He is stable and BP is much higher than I would expect.  No pain and no current bleeding.    Objective: Vital signs in last 24 hours: Temp:  [97.6 F (36.4 C)-99.4 F (37.4 C)] 98.1 F (36.7 C) (03/27 1220) Pulse Rate:  [57-92] 57 (03/27 1220) Resp:  [15-29] 21 (03/27 1220) BP: (81-184)/(54-131) 171/66 (03/27 1220) SpO2:  [94 %-100 %] 99 % (03/27 1220) Weight:  [77.1 kg (169 lb 15.6 oz)] 77.1 kg (169 lb 15.6 oz) (03/26 1630) Last BM Date: 06/04/16 1720 IV fluids 1600 Blood 225 urine BM x 3  Last one fresh blood Glucose up some NO BMP  Intake/Output from previous day: 03/26 0701 - 03/27 0700 In: 3320 [I.V.:1720; Blood:1600] Out: 225 [Urine:225] Intake/Output this shift: Total I/O In: 765 [I.V.:355; Blood:410] Out: 1825 [Urine:1825]  General appearance: alert, cooperative and no distress GI: soft, non-tender; bowel sounds normal; no masses,  no organomegaly  Lab Results:   Recent Labs  06/03/16 1328 06/04/16 0254  WBC 13.2*  --   HGB 5.1* 7.2*  HCT 16.0* 21.2*  PLT 173  --     BMET  Recent Labs  06/03/16 1328  NA 139  K 4.6  CL 112*  CO2 20*  GLUCOSE 264*  BUN 24*  CREATININE 1.51*  CALCIUM 7.6*   PT/INR  Recent Labs  06/03/16 1328  LABPROT 15.4*  INR 1.22     Recent Labs Lab 05/28/16 1605 05/30/16 0500 06/03/16 1328  AST 20  --  25  ALT 15*  --  14*  ALKPHOS 31*  --  40  BILITOT 0.5  --  0.7  PROT 4.3*  --  5.2*  ALBUMIN 2.3* 2.3* 2.5*     Lipase  No results found for: LIPASE   Studies/Results: Dg Chest Portable 1 View  Result Date: 06/03/2016 CLINICAL DATA:  83 year old male with the vagal episodes. Subsequent encounter. EXAM: PORTABLE CHEST 1 VIEW COMPARISON:  05/28/2016 chest x-ray FINDINGS: Cardiomegaly. Calcified tortuous aorta. No infiltrate, congestive heart failure or pneumothorax. No plain film evidence of pulmonary malignancy. Right central line removed. IMPRESSION: Cardiomegaly. Calcified tortuous  aorta. No infiltrate or congestive heart failure. Electronically Signed   By: Genia Del M.D.   On: 06/03/2016 14:47    Medications: . Chlorhexidine Gluconate Cloth  6 each Topical Daily  . insulin aspart  0-15 Units Subcutaneous Q4H  . levothyroxine  125 mcg Oral QAC breakfast  . sodium chloride flush  10-40 mL Intracatheter Q12H   . sodium chloride 75 mL/hr at 06/04/16 1307    Assessment/Plan Recurrent acute lower GI bleed. Status post appear mesenteric arteriogram and superselective coil embolization of active bleeding, 05/28/16 Dr. Vernard Gambles IR. Transfused 5 units of PRBC yesterday =>> Hbg: 6.6 > 7.3 > 7.9 >.5.1 > 7.2 Acute on chronic kidney disease.- Creatinine 1.51 Type 2 diabetes mellitus. GERD. Anemia -  H/H: 5.1/16    Plan:  Recheck labs, and follow closely.  If we have to do surgery, Dr. Dois Davenport plan last week was right colon, and leave him open.  If no bleeding or bleeding take back to the OR  for closure if no bleeding;  or further colon resection if he has more bleeding.       LOS: 1 day    Zachary Cook 06/04/2016 [phone]

## 2016-06-04 NOTE — Progress Notes (Signed)
Patient ID: Zachary Cook, male   DOB: 06/25/1932, 83 y.o.   MRN: [id number]    Referring Physician(s): Dr. [name]  Supervising Physician: Jacqulynn Cadet  Patient Status: [facility] - In-pt  Chief Complaint: LGIB  Subjective: Patient known to IR for recent angioembo for a LGIB.  He did well and went home.  He returned yesterday after feeling weak and having melanotic stools.  His hgb was found to be 5.1.  He has been transfused.  He did have a stool last night, but he isn't sure whether it was bloody or not as he did not look at it.  He has had none since that time.  Allergies: Patient has no known allergies.  Medications: Prior to Admission medications   Medication Sig Start Date End Date Taking? Authorizing Provider  aspirin 81 MG tablet Take 81 mg by mouth daily.   Yes Historical Provider, MD  esomeprazole ([place]) 40 MG capsule TAKE ONE CAPSULE BY MOUTH DAILY AT NOON Patient taking differently: TAKE ONE CAPSULE BY MOUTH DAILY 04/14/16  Yes [name], MD  glimepiride (AMARYL) 2 MG tablet TAKE 2 TABLETS(4 MG) BY MOUTH DAILY BEFORE DINNER Patient taking differently: take '2mg'$  by mouth with dinner 04/05/16  Yes Elayne Snare, MD  levothyroxine (SYNTHROID, LEVOTHROID) 125 MCG tablet Take 1 tablet (125 mcg total) by mouth daily before breakfast. 05/07/16  Yes Elayne Snare, MD  metFORMIN (GLUCOPHAGE-XR) 750 MG 24 hr tablet TAKE 2 TABLETS(1500 MG) BY MOUTH DAILY WITH BREAKFAST 05/23/16  Yes Elayne Snare, MD  naproxen sodium (ANAPROX) 220 MG tablet Take 220 mg by mouth 2 (two) times daily as needed (pain).   Yes Historical Provider, MD  simvastatin (ZOCOR) 40 MG tablet TAKE 1 TABLET(40 MG) BY MOUTH AT BEDTIME 02/04/16  Yes Chelle Jeffery, PA-C  tamsulosin (FLOMAX) 0.4 MG CAPS capsule TAKE 1 CAPSULE BY MOUTH EVERY DAY 03/24/16  Yes [name], MD  B-D UF III MINI PEN NEEDLES 31G X 5 MM MISC USE ONE PER DAY WITH VICTOZA 11/22/15   Elayne Snare, MD  Blood Glucose Monitoring Suppl  (ACCU-CHEK NANO SMARTVIEW) W/DEVICE KIT  12/02/13   Historical Provider, MD  ferrous sulfate 325 (65 FE) MG tablet Take 1 tablet (325 mg total) by mouth 3 (three) times daily with meals. 05/31/16   Doreatha Lew, MD  FLUAD 0.5 ML SUSY  11/30/15   Historical Provider, MD  glucose blood (ACCU-CHEK SMARTVIEW) test strip Test blood sugar twice daily 04/10/16   Elayne Snare, MD  Insulin Glargine (TOUJEO SOLOSTAR) 300 UNIT/ML SOPN Inject 12 Units into the skin daily. 04/10/16   Elayne Snare, MD  liraglutide (VICTOZA) 18 MG/3ML SOPN ADMINISTER 1.2 MG UNDER THE SKIN DAILY 05/07/16   Elayne Snare, MD    Vital Signs: BP (!) 169/64   Pulse 62   Temp 97.8 F (36.6 C) (Oral)   Resp (!) 25   Ht '5\' 6"'$  (1.676 m)   Wt 169 lb 15.6 oz (77.1 kg)   SpO2 97%   BMI 27.43 kg/m   Physical Exam: abd: soft, NT, ND, +BS  Imaging: Dg Chest Portable 1 View  Result Date: 06/03/2016 CLINICAL DATA:  83 year old male with the vagal episodes. Subsequent encounter. EXAM: PORTABLE CHEST 1 VIEW COMPARISON:  05/28/2016 chest x-ray FINDINGS: Cardiomegaly. Calcified tortuous aorta. No infiltrate, congestive heart failure or pneumothorax. No plain film evidence of pulmonary malignancy. Right central line removed. IMPRESSION: Cardiomegaly. Calcified tortuous aorta. No infiltrate or congestive heart failure. Electronically Signed   By:  Genia Del M.D.   On: 06/03/2016 14:47    Labs:  CBC:  Recent Labs  05/29/16 0900 05/30/16 0500 05/31/16 0516 06/03/16 1328 06/04/16 0254  WBC 9.3 7.2 6.6 13.2*  --   HGB 7.7* 7.3* 7.9* 5.1* 7.2*  HCT 22.1* 21.5* 23.5* 16.0* 21.2*  PLT 148* 123* 134* 173  --     COAGS:  Recent Labs  05/27/16 1954 05/28/16 1605 06/03/16 1328  INR 1.16 1.26 1.22  APTT  --   --  26    BMP:  Recent Labs  05/29/16 0900 05/30/16 0500 05/31/16 0516 06/03/16 1328  NA 139 139 140 139  K 5.1 4.9 4.7 4.6  CL 115* 114* 113* 112*  CO2 20* 22 23 20*  GLUCOSE 168* 123* 133* 264*  BUN 35* 24*  21* 24*  CALCIUM 7.5* 7.9* 8.4* 7.6*  CREATININE 1.72* 1.32* 1.43* 1.51*  GFRNONAA 36* 49* 44* 42*  GFRAA 41* 57* 51* 48*    LIVER FUNCTION TESTS:  Recent Labs  03/08/16 0943 05/27/16 1954 05/28/16 1605 05/30/16 0500 06/03/16 1328  BILITOT 0.8 1.1 0.5  --  0.7  AST 13 33 20  --  25  ALT 10 13* 15*  --  14*  ALKPHOS 59 41 31*  --  40  PROT 7.3 5.6* 4.3*  --  5.2*  ALBUMIN 4.2 2.6* 2.3* 2.3* 2.5*    Assessment and Plan: 1. LGI bleed, s/p embolization of hepatic flexure last week with recurrent bleeding -Case was discussed yesterday with Dr. Pascal Lux per GI.  We do not want to rush into another angio as the patient has CKD and felt the area was well embolized; however, if the patient rebleeds and it is unable to be stopped or controlled, then we could consider a repeat angio. -we will follow the patient, but he currently appears stable and with no evidence of acute bleeding at this time. -further plans for GI and surgery  Electronically Signed: Akeylah Hendel E 06/04/2016, 11:33 AM   I spent a total of 15 Minutes at the the patient's bedside AND on the patient's hospital floor or unit, greater than 50% of which was counseling/coordinating care for LGI bleed

## 2016-06-05 ENCOUNTER — Telehealth: Payer: Self-pay | Admitting: Endocrinology

## 2016-06-05 LAB — PREPARE FRESH FROZEN PLASMA
UNIT DIVISION: 0
Unit division: 0

## 2016-06-05 LAB — HEMOGLOBIN AND HEMATOCRIT, BLOOD
HCT: 22 % — ABNORMAL LOW (ref 39.0–52.0)
HCT: 22.8 % — ABNORMAL LOW (ref 39.0–52.0)
HEMATOCRIT: 20.4 % — AB (ref 39.0–52.0)
HEMOGLOBIN: 7.9 g/dL — AB (ref 13.0–17.0)
Hemoglobin: 7.5 g/dL — ABNORMAL LOW (ref 13.0–17.0)
Hemoglobin: 7.1 g/dL — ABNORMAL LOW (ref 13.0–17.0)

## 2016-06-05 LAB — BASIC METABOLIC PANEL
ANION GAP: 4 — AB (ref 5–15)
BUN: 29 mg/dL — AB (ref 6–20)
CALCIUM: 6.9 mg/dL — AB (ref 8.9–10.3)
CO2: 21 mmol/L — AB (ref 22–32)
Chloride: 113 mmol/L — ABNORMAL HIGH (ref 101–111)
Creatinine, Ser: 1.17 mg/dL (ref 0.61–1.24)
GFR calc Af Amer: >60 mL/min (ref >60)
GFR, EST NON AFRICAN AMERICAN: 57 mL/min — AB (ref >60)
GLUCOSE: 110 mg/dL — AB (ref 65–99)
Potassium: 3.4 mmol/L — ABNORMAL LOW (ref 3.5–5.1)
Sodium: 138 mmol/L (ref 135–145)

## 2016-06-05 LAB — GLUCOSE, CAPILLARY
GLUCOSE-CAPILLARY: 116 mg/dL — AB (ref 65–99)
GLUCOSE-CAPILLARY: 128 mg/dL — AB (ref 65–99)
GLUCOSE-CAPILLARY: 236 mg/dL — AB (ref 65–99)
Glucose-Capillary: 109 mg/dL — ABNORMAL HIGH (ref 65–99)
Glucose-Capillary: 197 mg/dL — ABNORMAL HIGH (ref 65–99)

## 2016-06-05 LAB — BPAM FFP
BLOOD PRODUCT EXPIRATION DATE: 201804012359
Blood Product Expiration Date: 201804012359
ISSUE DATE / TIME: 201803270515
ISSUE DATE / TIME: 201803271151
UNIT TYPE AND RH: 6200
Unit Type and Rh: 6200

## 2016-06-05 LAB — PREPARE RBC (CROSSMATCH)

## 2016-06-05 MED ORDER — METRONIDAZOLE 500 MG PO TABS
1000.0000 mg | ORAL_TABLET | ORAL | Status: AC
  Administered 2016-06-05: 1000 mg via ORAL
  Filled 2016-06-05: qty 2

## 2016-06-05 MED ORDER — SODIUM CHLORIDE 0.9 % IV SOLN
Freq: Once | INTRAVENOUS | Status: AC
  Administered 2016-06-05: 16:00:00 via INTRAVENOUS

## 2016-06-05 MED ORDER — NEOMYCIN SULFATE 500 MG PO TABS
1000.0000 mg | ORAL_TABLET | Freq: Once | ORAL | Status: DC
  Filled 2016-06-05: qty 2

## 2016-06-05 MED ORDER — METRONIDAZOLE 500 MG PO TABS: 1000.0000 mg | ORAL_TABLET | ORAL | Status: DC

## 2016-06-05 MED ORDER — DEXTROSE 5 % IV SOLN
2.0000 g | INTRAVENOUS | Status: AC
  Administered 2016-06-06: 2 g via INTRAVENOUS
  Filled 2016-06-05: qty 2

## 2016-06-05 MED ORDER — NEOMYCIN SULFATE 500 MG PO TABS
1000.0000 mg | ORAL_TABLET | Freq: Once | ORAL | Status: AC
  Administered 2016-06-05: 1000 mg via ORAL
  Filled 2016-06-05: qty 2

## 2016-06-05 MED ORDER — POLYETHYLENE GLYCOL 3350 17 G PO PACK
34.0000 g | PACK | Freq: Once | ORAL | Status: DC
  Filled 2016-06-05 (×2): qty 2

## 2016-06-05 MED ORDER — PEG-KCL-NACL-NASULF-NA ASC-C 100 G PO SOLR
0.5000 | Freq: Once | ORAL | Status: DC
  Filled 2016-06-05: qty 1

## 2016-06-05 MED ORDER — NEOMYCIN SULFATE 500 MG PO TABS
1000.0000 mg | ORAL_TABLET | Freq: Once | ORAL | Status: AC
  Administered 2016-06-06: 1000 mg via ORAL
  Filled 2016-06-05: qty 2

## 2016-06-05 MED ORDER — PEG-KCL-NACL-NASULF-NA ASC-C 100 G PO SOLR: 0.5000 | Freq: Once | ORAL | Status: DC

## 2016-06-05 MED ORDER — ALVIMOPAN 12 MG PO CAPS
12.0000 mg | ORAL_CAPSULE | Freq: Once | ORAL | Status: DC
  Filled 2016-06-05: qty 1

## 2016-06-05 MED ORDER — CHLORHEXIDINE GLUCONATE CLOTH 2 % EX PADS
6.0000 | MEDICATED_PAD | Freq: Once | CUTANEOUS | Status: AC
  Administered 2016-06-06: 6 via TOPICAL

## 2016-06-05 MED ORDER — PEG-KCL-NACL-NASULF-NA ASC-C 100 G PO SOLR: 1.0000 | Freq: Once | ORAL | Status: DC

## 2016-06-05 MED ORDER — METRONIDAZOLE 500 MG PO TABS
1000.0000 mg | ORAL_TABLET | ORAL | Status: AC
  Administered 2016-06-06: 1000 mg via ORAL
  Filled 2016-06-05: qty 2

## 2016-06-05 MED ORDER — GENTAMICIN SULFATE 40 MG/ML IJ SOLN
INTRAMUSCULAR | Status: DC
  Filled 2016-06-05: qty 6

## 2016-06-05 MED ORDER — CHLORHEXIDINE GLUCONATE CLOTH 2 % EX PADS
6.0000 | MEDICATED_PAD | Freq: Once | CUTANEOUS | Status: AC
  Administered 2016-06-05: 6 via TOPICAL

## 2016-06-05 MED ORDER — NEOMYCIN SULFATE 500 MG PO TABS
1000.0000 mg | ORAL_TABLET | ORAL | Status: AC
  Administered 2016-06-05: 1000 mg via ORAL
  Filled 2016-06-05: qty 2

## 2016-06-05 NOTE — Progress Notes (Signed)
Pt with large bright red bloody BM, Guenther notified.

## 2016-06-05 NOTE — Progress Notes (Signed)
     [facility] Progress Note  Chief Complaint:   GI bleed  Subjective: had a bloody BM during the night but none since  Objective:  Vital signs in last 24 hours: Temp:  [97 F (36.1 C)-98.1 F (36.7 C)] 98.1 F (36.7 C) (03/28 0800) Pulse Rate:  [47-99] 70 (03/28 1100) Resp:  [14-29] 17 (03/28 1100) BP: (84-181)/(43-115) 130/62 (03/28 1100) SpO2:  [94 %-100 %] 100 % (03/28 1100) Last BM Date: 06/05/16 General:   Alert, well-developed, white male in NAD EENT:  Normal hearing, non icteric sclera, conjunctive pink.  Heart:  Regular rate and rhythm Pulm: Normal respiratory effort, lungs CTA bilaterally without wheezes or crackles. Abdomen:  Soft, nondistended, nontender.  Normal bowel sounds, no masses felt. No hepatomegaly.    Neurologic:  Alert and  oriented x4;  grossly normal neurologically. Psych:  Alert and cooperative. Normal mood and affect.   Intake/Output from previous day: 03/27 0701 - 03/28 0700 In: 2336.3 [I.V.:1696.3; Blood:640] Out: 3875 [Urine:3875] Intake/Output this shift: Total I/O In: 300 [I.V.:300] Out: -   Lab Results:  Recent Labs  06/03/16 1328  06/04/16 2330 06/05/16 0326 06/05/16 0726  WBC 13.2*  --   --  12.4*  --   HGB 5.1*  < > 8.4* 7.4* 7.5*  HCT 16.0*  < > 24.0* 21.1* 22.0*  PLT 173  --   --  124*  --   < > = values in this interval not displayed. BMET  Recent Labs  06/03/16 1328 06/04/16 1619 06/05/16 0326  NA 139 140  141 138  K 4.6 3.3*  3.3* 3.4*  CL 112* 114*  114* 113*  CO2 20* 22  23 21*  GLUCOSE 264* 102*  99 110*  BUN 24* 26*  25* 29*  CREATININE 1.51* 1.01  1.04 1.17  CALCIUM 7.6* 7.2*  7.3* 6.9*   LFT  Recent Labs  06/04/16 1619  PROT 4.9*  ALBUMIN 2.3*  AST 19  ALT 13*  ALKPHOS 43  BILITOT 0.9   PT/INR  Recent Labs  06/03/16 1328  LABPROT 15.4*  INR 1.22    Assessment / Plan:  83 yo male admitted with recurrent hematochezia and severe ABL despite coil embolization  of a distal branch of SMA on 3/20. He has required several units of blood + FFP this admission. Bloody BM during the night but none since. Hgb has been stable, currently 7.9.  -Will proceed with a diagnostic colonoscopy in am.  The risks and benefits of the procedure were discussed and the patient agrees to proceed. If he rebleeds in the interim then he will be taken to OR (I spoke with Dr. Barry Dienes this am).  Active Problems:   GI bleed   Syncope   LOS: 2 days   Tye Savoy NP 06/05/2016, 11:58 AM  Pager number [phone]     Attending physician's note   I have taken an interval history, reviewed the chart and examined the patient. I agree with the Advanced Practitioner's note, impression and recommendations. Recurrent bloody stool last night and again this afternoon. Receiving PRBC now. Colonoscopy for tomorrow cancelled and surgical consult contacted.   [name], MD [name] [phone] Mon-Fri 8a-5p [phone] after 5p, weekends, holidays

## 2016-06-05 NOTE — Progress Notes (Signed)
PROGRESS NOTE [facility]   Zachary Cook   ZOX:[id number] DOB: 06-16-1932  DOA: 06/03/2016 PCP: No PCP Per Patient   Brief Narrative:  44 ? with medical history significant of T2DM, Barrett's and diverticulosis  Admit 3/36 Recently admitted for GI bleed with Coil embolization 3/20. Patient was recently discharged after being treated for GI bleed with coil embolization of the distal branch of SMA on 05/28/2016. In the emergency room patient had a large bloody bowel movement and he was found to have hemoglobin of 5.1. Admitted for GI bleed and surgical evaluation.    Subjective:  Large amount of bleeding afte rlunch today Now NPO Feels ok otherwsie -no abd pain No cp   Assessment & Plan: Lower GI due to diverticulosis - ? If re-bleeding from previous coil, placed on 05/28/16 Anemia of acute blood loss  s/p 5 units of PRBC's--Has had bleeding since 3/28 am when Hb 7.9 transfuse 2 more Units Personally d/w Dr. Otilio Connors be going to OR 3/29 s/p 1 unit FFP - 1 more to go  Continue IVF for now  GI, IR and surgery consulted appreciate recommendations  Serial CBC every 4 hrs - Continue to monitor  Keep on only water  Acute on CKD III - likely due to hypoperfusion Baseline Cr 1.37 Continue IVF  Elevated BP - w/o diagnosis of HTN - likely due to high volume transfusion  Will decrease IVF rate to 75 cc/hr One dose of Lasix given between transfusion, will add another dose after last unit  DM type 2 Continue to hold home medication  Monitor CGB's  SSI   Hypothyroidism  Continue Levothyroxine 125 mcg daily   DVT prophylaxis: SCD's  Code Status: FULL  Family Communication: Wife at bedside  Disposition Plan: TBD patient with active GI bleed, pending surgical evaluation  Consultants:   GI  Surgery   IR  Procedures:   None    Objective: Vitals:   06/05/16 1100 06/05/16 1200 06/05/16 1201 06/05/16 1300  BP: 130/62   (!) 143/94  Pulse: 70 87 87 85   Resp: '17 19 20 19  '$ Temp:  98.5 F (36.9 C)    TempSrc:  Oral    SpO2: 100% 96% 93% 100%  Weight:      Height:        Intake/Output Summary (Last 24 hours) at 06/05/16 1339 Last data filed at 06/05/16 1300  Gross per 24 hour  Intake          2261.25 ml  Output             2350 ml  Net           -88.75 ml   Filed Weights   06/03/16 1243 06/03/16 1630  Weight: 79.4 kg (175 lb) 77.1 kg (169 lb 15.6 oz)    Examination:  General exam: Appears calm and comfortable HEENT: Pale conjunctiva, OP moist  Respiratory system: Anterior clear to auscultation. No wheezes,crackle or rhonchi Cardiovascular system: S1 & S2 heard, RRR. No JVD, murmurs, rubs or gallops Gastrointestinal system: Abdomen is nondistended, soft and nontender. Central nervous system: Alert and oriented. No focal neurological deficits. Extremities: No pedal edema.  Skin: Skin  Psychiatry: Judgement and insight appear normal. Mood & affect appropriate.    Data Reviewed: I have personally reviewed following labs and imaging studies  CBC:  Recent Labs Lab 05/30/16 0500 05/31/16 0516 06/03/16 1328  06/04/16 1935 06/04/16 2330 06/05/16 0326 06/05/16 0726 06/05/16 1153  WBC 7.2 6.6 13.2*  --   --   --  12.4*  --   --   NEUTROABS  --  3.7 10.3*  --   --   --  8.7*  --   --   HGB 7.3* 7.9* 5.1*  < > 8.9* 8.4* 7.4* 7.5* 7.9*  HCT 21.5* 23.5* 16.0*  < > 25.5* 24.0* 21.1* 22.0* 22.8*  MCV 86.7 88.7 92.5  --   --   --  85.8  --   --   PLT 123* 134* 173  --   --   --  124*  --   --   < > = values in this interval not displayed. Basic Metabolic Panel:  Recent Labs Lab 05/30/16 0500 05/31/16 0516 06/03/16 1328 06/04/16 1619 06/05/16 0326  NA 139 140 139 140  141 138  K 4.9 4.7 4.6 3.3*  3.3* 3.4*  CL 114* 113* 112* 114*  114* 113*  CO2 22 23 20* 22  23 21*  GLUCOSE 123* 133* 264* 102*  99 110*  BUN 24* 21* 24* 26*  25* 29*  CREATININE 1.32* 1.43* 1.51* 1.01  1.04 1.17  CALCIUM 7.9* 8.4* 7.6* 7.2*   7.3* 6.9*   GFR: Estimated Creatinine Clearance: 48.4 mL/min (by C-G formula based on SCr of 1.17 mg/dL). Liver Function Tests:  Recent Labs Lab 05/30/16 0500 06/03/16 1328 06/04/16 1619  AST  --  25 19  ALT  --  14* 13*  ALKPHOS  --  40 43  BILITOT  --  0.7 0.9  PROT  --  5.2* 4.9*  ALBUMIN 2.3* 2.5* 2.3*   Coagulation Profile:  Recent Labs Lab 06/03/16 1328  INR 1.22   Cardiac Enzymes: No results for input(s): CKTOTAL, CKMB, CKMBINDEX, TROPONINI in the last 168 hours. CBG:  Recent Labs Lab 06/04/16 1950 06/04/16 2307 06/05/16 0317 06/05/16 0755 06/05/16 1126  GLUCAP 105* 153* 116* 128* 109*    Recent Labs Lab 05/30/16 0500  PROCALCITON 2.68    Recent Results (from the past 240 hour(s))  MRSA PCR Screening     Status: None   Collection Time: 05/28/16  2:59 AM  Result Value Ref Range Status   MRSA by PCR NEGATIVE NEGATIVE Final    Comment:        The GeneXpert MRSA Assay (FDA approved for NASAL specimens only), is one component of a comprehensive MRSA colonization surveillance program. It is not intended to diagnose MRSA infection nor to guide or monitor treatment for MRSA infections.   Culture, blood (Routine X 2) w Reflex to ID Panel     Status: None   Collection Time: 05/28/16  4:16 PM  Result Value Ref Range Status   Specimen Description BLOOD BLOOD RIGHT ARM  Final   Special Requests BOTTLES DRAWN AEROBIC AND ANAEROBIC 7 CC EA  Final   Culture   Final    NO GROWTH 5 DAYS Performed at [facility], [place] [address] [place], [place] [address]    Report Status 06/02/2016 FINAL  Final  Culture, blood (Routine X 2) w Reflex to ID Panel     Status: None   Collection Time: 05/28/16  4:26 PM  Result Value Ref Range Status   Specimen Description BLOOD BLOOD LEFT HAND  Final   Special Requests BOTTLES DRAWN AEROBIC AND ANAEROBIC 5.5 CC EA  Final   Culture   Final    NO GROWTH 5 DAYS Performed at [facility], [place] [address]  [place], [place] [address]    Report Status 06/02/2016  FINAL  Final     Radiology Studies: Dg Chest Portable 1 View  Result Date: 06/03/2016 CLINICAL DATA:  83 year old male with the vagal episodes. Subsequent encounter. EXAM: PORTABLE CHEST 1 VIEW COMPARISON:  05/28/2016 chest x-ray FINDINGS: Cardiomegaly. Calcified tortuous aorta. No infiltrate, congestive heart failure or pneumothorax. No plain film evidence of pulmonary malignancy. Right central line removed. IMPRESSION: Cardiomegaly. Calcified tortuous aorta. No infiltrate or congestive heart failure. Electronically Signed   By: Genia Del M.D.   On: 06/03/2016 14:47    Scheduled Meds: . Chlorhexidine Gluconate Cloth  6 each Topical Daily  . insulin aspart  0-15 Units Subcutaneous Q4H  . levothyroxine  125 mcg Oral QAC breakfast  . mouth rinse  15 mL Mouth Rinse BID  . peg 3350 powder  0.5 kit Oral Once   And  . [START ON 06/06/2016] peg 3350 powder  0.5 kit Oral Once  . sodium chloride flush  10-40 mL Intracatheter Q12H   Continuous Infusions: . sodium chloride 75 mL/hr at 06/05/16 1300     LOS: 2 days    Verneita Griffes, MD [facility] (P) [phone]   If 7PM-7AM, please contact night-coverage [email or web address] Password [email or web address] 06/05/2016, 1:39 PM

## 2016-06-05 NOTE — Telephone Encounter (Signed)
Noted  

## 2016-06-05 NOTE — Progress Notes (Addendum)
H&H, PT/INR not collected at 2000, due to pt having blood transfusions. Will collect 2 hours post transfusion. Will continue to monitor.

## 2016-06-05 NOTE — Telephone Encounter (Signed)
Pt's wife called in and wanted to make Dr. Dwyane Dee aware that the Pt has been in the hospital at [facility] for bleeding.

## 2016-06-05 NOTE — Progress Notes (Addendum)
RN spoke Dr. Johney Maine regarding pt plan for colectomy tomorrow and for antibiotic administration. Orders written for antibiotics at 1735, antibiotic orders (Neomycin and Metronidazole) state to give at 1400, 1500, and 2200 following Miralax bowl prep. Per day shift RN, bowel prep not to be given since pt is having large, loose, bloody BMs. RN informed Dr. Johney Maine of this information. RN questioned Dr. Johney Maine regarding times to administer antibiotics since the order was initiated 1735. Dr. Johney Maine informed RN to give doses of Neomycin and Metronidazole now, 2300, and at 0200, if pt is awake. RN notified pharmacy of administration times. Will continue to monitor.

## 2016-06-05 NOTE — Progress Notes (Signed)
Subjective: No real change, he has not had a BM this AM, so he does not know if he is bleeding but it would seem he is stable right now.    Objective: Vital signs in last 24 hours: Temp:  [97 F (36.1 C)-98.5 F (36.9 C)] 98.5 F (36.9 C) (03/28 1200) Pulse Rate:  [47-99] 87 (03/28 1201) Resp:  [14-29] 20 (03/28 1201) BP: (84-181)/(43-115) 130/62 (03/28 1100) SpO2:  [93 %-100 %] 93 % (03/28 1201) Last BM Date: 06/05/16 240 Po this AM/NPO yesterday 2300 IV 3875 urine yesterday BM x 2 Afebrile, VSS K+ 3.4 H/H  7.9/22.8 Intake/Output from previous day: 03/27 0701 - 03/28 0700 In: 2336.3 [I.V.:1696.3; Blood:640] Out: 3875 [Urine:3875] Intake/Output this shift: Total I/O In: 540 [P.O.:240; I.V.:300] Out: 300 [Urine:300]  General appearance: alert, cooperative and no distress Resp: clear to auscultation bilaterally GI: soft, non-tender; bowel sounds normal; no masses,  no organomegaly  Lab Results:   Recent Labs  06/03/16 1328  06/05/16 0326 06/05/16 0726 06/05/16 1153  WBC 13.2*  --  12.4*  --   --   HGB 5.1*  < > 7.4* 7.5* 7.9*  HCT 16.0*  < > 21.1* 22.0* 22.8*  PLT 173  --  124*  --   --   < > = values in this interval not displayed.  BMET  Recent Labs  06/04/16 1619 06/05/16 0326  NA 140  141 138  K 3.3*  3.3* 3.4*  CL 114*  114* 113*  CO2 22  23 21*  GLUCOSE 102*  99 110*  BUN 26*  25* 29*  CREATININE 1.01  1.04 1.17  CALCIUM 7.2*  7.3* 6.9*   PT/INR  Recent Labs  06/03/16 1328  LABPROT 15.4*  INR 1.22     Recent Labs Lab 05/30/16 0500 06/03/16 1328 06/04/16 1619  AST  --  25 19  ALT  --  14* 13*  ALKPHOS  --  40 43  BILITOT  --  0.7 0.9  PROT  --  5.2* 4.9*  ALBUMIN 2.3* 2.5* 2.3*     Lipase  No results found for: LIPASE   Studies/Results: Dg Chest Portable 1 View  Result Date: 06/03/2016 CLINICAL DATA:  83 year old male with the vagal episodes. Subsequent encounter. EXAM: PORTABLE CHEST 1 VIEW COMPARISON:   05/28/2016 chest x-ray FINDINGS: Cardiomegaly. Calcified tortuous aorta. No infiltrate, congestive heart failure or pneumothorax. No plain film evidence of pulmonary malignancy. Right central line removed. IMPRESSION: Cardiomegaly. Calcified tortuous aorta. No infiltrate or congestive heart failure. Electronically Signed   By: Genia Del M.D.   On: 06/03/2016 14:47    Medications: . Chlorhexidine Gluconate Cloth  6 each Topical Daily  . insulin aspart  0-15 Units Subcutaneous Q4H  . levothyroxine  125 mcg Oral QAC breakfast  . mouth rinse  15 mL Mouth Rinse BID  . peg 3350 powder  0.5 kit Oral Once   And  . [START ON 06/06/2016] peg 3350 powder  0.5 kit Oral Once  . sodium chloride flush  10-40 mL Intracatheter Q12H   . sodium chloride 75 mL/hr at 06/05/16 1300   Assessment/Plan Recurrent acute lower GI bleed. Status post appear mesenteric arteriogram and superselective coil embolization of active bleeding, 05/28/16 Dr. Vernard Gambles IR. Transfused 5 units of PRBC yesterday =>> Hbg: 6.6 > 7.3 > 7.9 >.5.1 > 7.2 >  7.9 Acute on chronic kidney disease.- Creatinine 1.51 =>>1.17 Type 2 diabetes mellitus. GERD. Anemia - H/H: 5.1/16 FEN:  IV fluids/clears. ID:  no Abx DVT:  SCD's added  Plan:  GI is going to try and do a colonoscopy tomorrow.  If they cannot do this we will plan a partial colectomy tomorrow.    He is NPO after MN.     LOS: 2 days    JENNINGS,WILLARD 06/05/2016 [phone]  

## 2016-06-06 ENCOUNTER — Encounter (HOSPITAL_COMMUNITY): Payer: Self-pay | Admitting: Anesthesiology

## 2016-06-06 ENCOUNTER — Encounter (HOSPITAL_COMMUNITY)
Admission: EM | Disposition: A | Discharge status: Home Health | Payer: Self-pay | Source: Home / Self Care | Attending: Family Medicine

## 2016-06-06 ENCOUNTER — Inpatient Hospital Stay (HOSPITAL_COMMUNITY): Payer: Medicare Other | Admitting: Anesthesiology

## 2016-06-06 HISTORY — PX: PARTIAL COLECTOMY: SHX5273

## 2016-06-06 LAB — GLUCOSE, CAPILLARY
GLUCOSE-CAPILLARY: 103 mg/dL — AB (ref 65–99)
GLUCOSE-CAPILLARY: 139 mg/dL — AB (ref 65–99)
Glucose-Capillary: 164 mg/dL — ABNORMAL HIGH (ref 65–99)
Glucose-Capillary: 132 mg/dL — ABNORMAL HIGH (ref 65–99)
Glucose-Capillary: 209 mg/dL — ABNORMAL HIGH (ref 65–99)
Glucose-Capillary: 242 mg/dL — ABNORMAL HIGH (ref 65–99)
Glucose-Capillary: 197 mg/dL — ABNORMAL HIGH (ref 65–99)
Glucose-Capillary: 161 mg/dL — ABNORMAL HIGH (ref 65–99)

## 2016-06-06 LAB — CBC WITH DIFFERENTIAL/PLATELET
Basophils Absolute: 0.1 10*3/uL (ref 0.0–0.1)
Basophils Relative: 0 %
EOS PCT: 1 %
Eosinophils Absolute: 0.1 10*3/uL (ref 0.0–0.7)
HEMATOCRIT: 21.1 % — AB (ref 39.0–52.0)
Hemoglobin: 7.4 g/dL — ABNORMAL LOW (ref 13.0–17.0)
LYMPHS ABS: 2.5 10*3/uL (ref 0.7–4.0)
LYMPHS PCT: 20 %
MCH: 30.1 pg (ref 26.0–34.0)
MCHC: 35.1 g/dL (ref 30.0–36.0)
MCV: 85.8 fL (ref 78.0–100.0)
MONO ABS: 1.1 10*3/uL — AB (ref 0.1–1.0)
MONOS PCT: 9 %
NEUTROS ABS: 8.7 10*3/uL — AB (ref 1.7–7.7)
Neutrophils Relative %: 70 %
PLATELETS: 124 10*3/uL — AB (ref 150–400)
RBC: 2.46 MIL/uL — ABNORMAL LOW (ref 4.22–5.81)
RDW: 16.2 % — AB (ref 11.5–15.5)
WBC: 12.4 10*3/uL — ABNORMAL HIGH (ref 4.0–10.5)

## 2016-06-06 LAB — PROTIME-INR
INR: 1.12
PROTHROMBIN TIME: 14.4 s (ref 11.4–15.2)

## 2016-06-06 LAB — HEMOGLOBIN AND HEMATOCRIT, BLOOD
HCT: 28.8 % — ABNORMAL LOW (ref 39.0–52.0)
HCT: 26.4 % — ABNORMAL LOW (ref 39.0–52.0)
HCT: 30.1 % — ABNORMAL LOW (ref 39.0–52.0)
HEMATOCRIT: 28.2 % — AB (ref 39.0–52.0)
HEMOGLOBIN: 10.3 g/dL — AB (ref 13.0–17.0)
Hemoglobin: 9.9 g/dL — ABNORMAL LOW (ref 13.0–17.0)
Hemoglobin: 9.1 g/dL — ABNORMAL LOW (ref 13.0–17.0)
Hemoglobin: 9.7 g/dL — ABNORMAL LOW (ref 13.0–17.0)

## 2016-06-06 SURGERY — COLECTOMY, PARTIAL
Anesthesia: General

## 2016-06-06 SURGERY — COLONOSCOPY
Anesthesia: Monitor Anesthesia Care

## 2016-06-06 MED ORDER — HYDRALAZINE HCL 20 MG/ML IJ SOLN
10.0000 mg | INTRAMUSCULAR | Status: DC | PRN
  Administered 2016-06-06 – 2016-06-10 (×3): 10 mg via INTRAVENOUS
  Filled 2016-06-06 (×3): qty 1

## 2016-06-06 MED ORDER — CEFOTETAN DISODIUM-DEXTROSE 2-2.08 GM-% IV SOLR
INTRAVENOUS | Status: AC
  Filled 2016-06-06: qty 50

## 2016-06-06 MED ORDER — NALOXONE HCL 0.4 MG/ML IJ SOLN: 0.4000 mg | INTRAMUSCULAR | Status: DC | PRN

## 2016-06-06 MED ORDER — LACTATED RINGERS IV SOLN
INTRAVENOUS | Status: DC | PRN
  Administered 2016-06-06: 06:00:00 via INTRAVENOUS

## 2016-06-06 MED ORDER — SODIUM CHLORIDE 0.9 % IV SOLN
INTRAVENOUS | Status: DC | PRN
  Administered 2016-06-06: 09:00:00 via INTRAVENOUS

## 2016-06-06 MED ORDER — BUPIVACAINE HCL (PF) 0.25 % IJ SOLN
INTRAMUSCULAR | Status: AC
  Filled 2016-06-06: qty 30

## 2016-06-06 MED ORDER — MEPERIDINE HCL 50 MG/ML IJ SOLN: 6.2500 mg | INTRAMUSCULAR | Status: DC | PRN

## 2016-06-06 MED ORDER — DEXAMETHASONE SODIUM PHOSPHATE 10 MG/ML IJ SOLN
INTRAMUSCULAR | Status: DC | PRN
  Administered 2016-06-06: 6 mg via INTRAVENOUS

## 2016-06-06 MED ORDER — DEXAMETHASONE SODIUM PHOSPHATE 10 MG/ML IJ SOLN
INTRAMUSCULAR | Status: AC
  Filled 2016-06-06: qty 1

## 2016-06-06 MED ORDER — LIDOCAINE 2% (20 MG/ML) 5 ML SYRINGE
INTRAMUSCULAR | Status: AC
  Filled 2016-06-06: qty 5

## 2016-06-06 MED ORDER — DIPHENHYDRAMINE HCL 50 MG/ML IJ SOLN: 12.5000 mg | Freq: Four times a day (QID) | INTRAMUSCULAR | Status: DC | PRN

## 2016-06-06 MED ORDER — FENTANYL CITRATE (PF) 100 MCG/2ML IJ SOLN
INTRAMUSCULAR | Status: AC
  Filled 2016-06-06: qty 4

## 2016-06-06 MED ORDER — ROCURONIUM BROMIDE 10 MG/ML (PF) SYRINGE
PREFILLED_SYRINGE | INTRAVENOUS | Status: DC | PRN
  Administered 2016-06-06: 40 mg via INTRAVENOUS

## 2016-06-06 MED ORDER — SUGAMMADEX SODIUM 200 MG/2ML IV SOLN
INTRAVENOUS | Status: AC
  Filled 2016-06-06: qty 2

## 2016-06-06 MED ORDER — SODIUM CHLORIDE 0.9% FLUSH: 9.0000 mL | INTRAVENOUS | Status: DC | PRN

## 2016-06-06 MED ORDER — PROPOFOL 10 MG/ML IV BOLUS
INTRAVENOUS | Status: DC | PRN
  Administered 2016-06-06: 130 mg via INTRAVENOUS

## 2016-06-06 MED ORDER — SUCCINYLCHOLINE CHLORIDE 200 MG/10ML IV SOSY
PREFILLED_SYRINGE | INTRAVENOUS | Status: DC | PRN
  Administered 2016-06-06: 120 mg via INTRAVENOUS

## 2016-06-06 MED ORDER — ONDANSETRON HCL 4 MG/2ML IJ SOLN
4.0000 mg | Freq: Four times a day (QID) | INTRAMUSCULAR | Status: DC | PRN
  Administered 2016-06-09: 4 mg via INTRAVENOUS
  Filled 2016-06-06: qty 2

## 2016-06-06 MED ORDER — ALBUMIN HUMAN 5 % IV SOLN
INTRAVENOUS | Status: AC
  Filled 2016-06-06: qty 750

## 2016-06-06 MED ORDER — BUPIVACAINE HCL (PF) 0.25 % IJ SOLN
INTRAMUSCULAR | Status: DC | PRN
  Administered 2016-06-06: 30 mL

## 2016-06-06 MED ORDER — FENTANYL CITRATE (PF) 100 MCG/2ML IJ SOLN
25.0000 ug | INTRAMUSCULAR | Status: DC | PRN
  Administered 2016-06-06 (×2): 50 ug via INTRAVENOUS

## 2016-06-06 MED ORDER — PHENYLEPHRINE 40 MCG/ML (10ML) SYRINGE FOR IV PUSH (FOR BLOOD PRESSURE SUPPORT)
PREFILLED_SYRINGE | INTRAVENOUS | Status: DC | PRN
  Administered 2016-06-06 (×4): 80 ug via INTRAVENOUS

## 2016-06-06 MED ORDER — ALBUMIN HUMAN 5 % IV SOLN
INTRAVENOUS | Status: DC | PRN
  Administered 2016-06-06: 08:00:00 via INTRAVENOUS

## 2016-06-06 MED ORDER — SUCCINYLCHOLINE CHLORIDE 200 MG/10ML IV SOSY
PREFILLED_SYRINGE | INTRAVENOUS | Status: AC
Start: 2016-06-06 — End: 2016-06-06
  Filled 2016-06-06: qty 10

## 2016-06-06 MED ORDER — MORPHINE SULFATE 2 MG/ML IV SOLN
INTRAVENOUS | Status: DC
  Administered 2016-06-06: 1 mg via INTRAVENOUS
  Administered 2016-06-06: 10:00:00 via INTRAVENOUS
  Administered 2016-06-07: 4 mg via INTRAVENOUS
  Administered 2016-06-07 (×2): 2 mg via INTRAVENOUS
  Administered 2016-06-07: 4 mg via INTRAVENOUS
  Administered 2016-06-07: 1 mg via INTRAVENOUS
  Administered 2016-06-08: 5 mg via INTRAVENOUS
  Filled 2016-06-06: qty 30

## 2016-06-06 MED ORDER — METOCLOPRAMIDE HCL 5 MG/ML IJ SOLN: 10.0000 mg | Freq: Once | INTRAMUSCULAR | Status: DC | PRN

## 2016-06-06 MED ORDER — LACTATED RINGERS IV SOLN
INTRAVENOUS | Status: DC
  Administered 2016-06-06 – 2016-06-08 (×3): via INTRAVENOUS

## 2016-06-06 MED ORDER — LIDOCAINE 2% (20 MG/ML) 5 ML SYRINGE
INTRAMUSCULAR | Status: DC | PRN
  Administered 2016-06-06: 100 mg via INTRAVENOUS

## 2016-06-06 MED ORDER — ACETAMINOPHEN 325 MG PO TABS
325.0000 mg | ORAL_TABLET | Freq: Four times a day (QID) | ORAL | Status: DC | PRN
  Administered 2016-06-10: 650 mg via ORAL
  Filled 2016-06-06: qty 2

## 2016-06-06 MED ORDER — BUPIVACAINE 0.25 % ON-Q PUMP DUAL CATH 400 ML
400.0000 mL | INJECTION | Status: DC
Start: 2016-06-06 — End: 2016-06-06
  Filled 2016-06-06: qty 400

## 2016-06-06 MED ORDER — PROPOFOL 10 MG/ML IV BOLUS
INTRAVENOUS | Status: AC
  Filled 2016-06-06: qty 40

## 2016-06-06 MED ORDER — ONDANSETRON HCL 4 MG/2ML IJ SOLN
INTRAMUSCULAR | Status: DC | PRN
  Administered 2016-06-06: 4 mg via INTRAVENOUS

## 2016-06-06 MED ORDER — LACTATED RINGERS IV SOLN
INTRAVENOUS | Status: DC | PRN
  Administered 2016-06-06 (×2): via INTRAVENOUS

## 2016-06-06 MED ORDER — PHENYLEPHRINE HCL 10 MG/ML IJ SOLN
INTRAMUSCULAR | Status: DC | PRN
  Administered 2016-06-06: 15 ug/min via INTRAVENOUS

## 2016-06-06 MED ORDER — DIPHENHYDRAMINE HCL 12.5 MG/5ML PO ELIX
12.5000 mg | ORAL_SOLUTION | Freq: Four times a day (QID) | ORAL | Status: DC | PRN
  Filled 2016-06-06: qty 5

## 2016-06-06 MED ORDER — FENTANYL CITRATE (PF) 250 MCG/5ML IJ SOLN
INTRAMUSCULAR | Status: AC
  Filled 2016-06-06: qty 5

## 2016-06-06 MED ORDER — BUPIVACAINE 0.25 % ON-Q PUMP DUAL CATH 300 ML
300.0000 mL | INJECTION | Status: DC
  Filled 2016-06-06: qty 300

## 2016-06-06 MED ORDER — CLONIDINE HCL 0.2 MG/24HR TD PTWK
0.2000 mg | MEDICATED_PATCH | TRANSDERMAL | Status: DC
  Administered 2016-06-06 – 2016-06-13 (×2): 0.2 mg via TRANSDERMAL
  Filled 2016-06-06 (×2): qty 1

## 2016-06-06 MED ORDER — ROCURONIUM BROMIDE 50 MG/5ML IV SOSY
PREFILLED_SYRINGE | INTRAVENOUS | Status: AC
  Filled 2016-06-06: qty 5

## 2016-06-06 MED ORDER — ONDANSETRON HCL 4 MG/2ML IJ SOLN
INTRAMUSCULAR | Status: AC
Start: 2016-06-06 — End: 2016-06-06
  Filled 2016-06-06: qty 2

## 2016-06-06 MED ORDER — LEVOTHYROXINE SODIUM 100 MCG IV SOLR
75.0000 ug | Freq: Every day | INTRAVENOUS | Status: DC
  Administered 2016-06-06 – 2016-06-09 (×4): 75 ug via INTRAVENOUS
  Filled 2016-06-06 (×5): qty 5

## 2016-06-06 MED ORDER — SUGAMMADEX SODIUM 200 MG/2ML IV SOLN
INTRAVENOUS | Status: DC | PRN
  Administered 2016-06-06: 200 mg via INTRAVENOUS

## 2016-06-06 MED ORDER — DEXTROSE IN LACTATED RINGERS 5 % IV SOLN: INTRAVENOUS | Status: DC

## 2016-06-06 MED ORDER — FENTANYL CITRATE (PF) 100 MCG/2ML IJ SOLN
INTRAMUSCULAR | Status: DC | PRN
  Administered 2016-06-06: 100 ug via INTRAVENOUS
  Administered 2016-06-06 (×3): 50 ug via INTRAVENOUS

## 2016-06-06 SURGICAL SUPPLY — 66 items
BLADE EXTENDED COATED 6.5IN (ELECTRODE) ×3 IMPLANT
BLADE HEX COATED 2.75 (ELECTRODE) ×6 IMPLANT
BLADE SURG SZ10 CARB STEEL (BLADE) ×3 IMPLANT
CATH KIT ON-Q SILVERSOAK 7.5IN (CATHETERS) ×6 IMPLANT
CELLS DAT CNTRL 66122 CELL SVR (MISCELLANEOUS) IMPLANT
CLOSURE STERI-STRIP 1/4X4 (GAUZE/BANDAGES/DRESSINGS) ×3 IMPLANT
COUNTER NEEDLE 20 DBL MAG RED (NEEDLE) ×3 IMPLANT
COVER MAYO STAND STRL (DRAPES) ×6 IMPLANT
COVER SURGICAL LIGHT HANDLE (MISCELLANEOUS) ×3 IMPLANT
DECANTER SPIKE VIAL GLASS SM (MISCELLANEOUS) ×3 IMPLANT
DRAIN CHANNEL 19F RND (DRAIN) IMPLANT
DRAPE LAPAROSCOPIC ABDOMINAL (DRAPES) ×3 IMPLANT
DRAPE SHEET LG 3/4 BI-LAMINATE (DRAPES) ×3 IMPLANT
DRAPE UTILITY XL STRL (DRAPES) ×6 IMPLANT
DRAPE WARM FLUID 44X44 (DRAPE) ×3 IMPLANT
DRSG OPSITE POSTOP 4X10 (GAUZE/BANDAGES/DRESSINGS) IMPLANT
DRSG OPSITE POSTOP 4X6 (GAUZE/BANDAGES/DRESSINGS) IMPLANT
DRSG OPSITE POSTOP 4X8 (GAUZE/BANDAGES/DRESSINGS) IMPLANT
DRSG TEGADERM 4X4.75 (GAUZE/BANDAGES/DRESSINGS) IMPLANT
DRSG TEGADERM 8X12 (GAUZE/BANDAGES/DRESSINGS) ×3 IMPLANT
ELECT PENCIL ROCKER SW 15FT (MISCELLANEOUS) ×3 IMPLANT
ELECT REM PT RETURN 15FT ADLT (MISCELLANEOUS) ×3 IMPLANT
ENDOLOOP SUT PDS II  0 18 (SUTURE)
ENDOLOOP SUT PDS II 0 18 (SUTURE) IMPLANT
GAUZE SPONGE 4X4 12PLY STRL (GAUZE/BANDAGES/DRESSINGS) ×3 IMPLANT
GLOVE BIO SURGEON STRL SZ 6 (GLOVE) ×6 IMPLANT
GLOVE INDICATOR 6.5 STRL GRN (GLOVE) ×6 IMPLANT
GOWN STRL REUS W/ TWL XL LVL3 (GOWN DISPOSABLE) ×3 IMPLANT
GOWN STRL REUS W/TWL XL LVL3 (GOWN DISPOSABLE) ×6
HANDLE SUCTION POOLE (INSTRUMENTS) ×1 IMPLANT
KIT BASIN OR (CUSTOM PROCEDURE TRAY) ×3 IMPLANT
LEGGING LITHOTOMY PAIR STRL (DRAPES) IMPLANT
LIGASURE IMPACT 36 18CM CVD LR (INSTRUMENTS) ×3 IMPLANT
LUBRICANT JELLY K Y 4OZ (MISCELLANEOUS) IMPLANT
PACK GENERAL/GYN (CUSTOM PROCEDURE TRAY) ×3 IMPLANT
RTRCTR WOUND ALEXIS 18CM MED (MISCELLANEOUS)
SCISSORS LAP 5X35 DISP (ENDOMECHANICALS) ×3 IMPLANT
SLEEVE SURGEON STRL (DRAPES) IMPLANT
SPONGE LAP 18X18 X RAY DECT (DISPOSABLE) ×6 IMPLANT
STAPLER GUN LINEAR PROX 60 (STAPLE) ×3 IMPLANT
STAPLER VISISTAT 35W (STAPLE) ×3 IMPLANT
SUCTION POOLE HANDLE (INSTRUMENTS) ×3
SUT MNCRL AB 4-0 PS2 18 (SUTURE) ×3 IMPLANT
SUT PDS AB 1 CTX 36 (SUTURE) IMPLANT
SUT PDS AB 1 TP1 96 (SUTURE) IMPLANT
SUT PROLENE 2 0 KS (SUTURE) IMPLANT
SUT SILK 2 0 (SUTURE)
SUT SILK 2 0 SH CR/8 (SUTURE) IMPLANT
SUT SILK 2-0 18XBRD TIE 12 (SUTURE) IMPLANT
SUT SILK 3 0 (SUTURE)
SUT SILK 3 0 SH CR/8 (SUTURE) IMPLANT
SUT SILK 3-0 18XBRD TIE 12 (SUTURE) IMPLANT
SUT VIC AB 2-0 SH 18 (SUTURE) ×3 IMPLANT
SUT VIC AB 3-0 SH 18 (SUTURE) ×3 IMPLANT
SUT VICRYL 2 0 18  UND BR (SUTURE) ×2
SUT VICRYL 2 0 18 UND BR (SUTURE) ×1 IMPLANT
SUT VICRYL 3 0 BR 18  UND (SUTURE) ×2
SUT VICRYL 3 0 BR 18 UND (SUTURE) ×1 IMPLANT
SYR BULB IRRIGATION 50ML (SYRINGE) ×3 IMPLANT
TOWEL OR 17X26 10 PK STRL BLUE (TOWEL DISPOSABLE) ×6 IMPLANT
TOWEL OR NON WOVEN STRL DISP B (DISPOSABLE) ×6 IMPLANT
TRAY FOLEY W/METER SILVER 16FR (SET/KITS/TRAYS/PACK) ×3 IMPLANT
TUBING CONNECTING 10 (TUBING) IMPLANT
TUBING CONNECTING 10' (TUBING)
TUNNELER SHEATH ON-Q 16GX12 DP (PAIN MANAGEMENT) IMPLANT
YANKAUER SUCT BULB TIP 10FT TU (MISCELLANEOUS) ×3 IMPLANT

## 2016-06-06 NOTE — Op Note (Signed)
PRE-OPERATIVE DIAGNOSIS: lower GI bleed  POST-OPERATIVE DIAGNOSIS:  Same  PROCEDURE:  Procedure(s): Open right hemicolectomy  SURGEON:  Surgeon(s): Stark Klein, MD  ASSIST:  Ailene Ravel, RNFA  ANESTHESIA:   general  DRAINS: OnQ in preperitoneal space   LOCAL MEDICATIONS USED:  NONE  SPECIMEN:  Source of Specimen:  right colon  DISPOSITION OF SPECIMEN:  PATHOLOGY  COUNTS:  YES  DICTATION: .Dragon Dictation  PLAN OF CARE: back to ICU  PATIENT DISPOSITION:  PACU - hemodynamically stable.  FINDINGS:  Large cecal diverticuli  EBL: <100  PROCEDURE:  Patient was identified in the holding area and was taken to the operating room where he was placed supine on operating room table. General anesthesia was induced. A Foley catheter was placed. His abdomen was prepped and draped in sterile fashion. A timeout was performed according to the surgical safety checklist. When all was correct, we continued.  A midline incision was made in the upper abdomen. The subcutaneous tissues were divided with the cautery. The fascia was entered in the midline and carried out the length of the skin incision. A Balfour was used to assist with visualization. The Balfour extension was placed and the small bowel was packed into the left lower quadrant.  The omentum was taken off of the transverse colon with cautery. The cecum was examined and multiple large diverticuli were noted there. The terminal ileum was divided with the 75 mm stapler. The mid transverse colon was divided with the 75 mm stapler. The LigaSure was then used to divide the mesocolon. The right colic was suture ligated. The specimen was opened and there were a few additional diverticuli that were large along the ascending colon.  The remainder of the transverse colon was evaluated in the descending colon. There were no large diverticuli seen anteriorly. Also, since the patient had a bleeding scan localized to the right side, was felt that  this was a safe place to truncate the resection. A side-to-side, functional end-to-end stapled anastomosis was created between the terminal ileum and the transverse colon. Vicryls were used to reinforce the apex of the anastomosis. This was allowed to fall back into the abdomen. The abdomen was irrigated and all of the dirty instruments were removed. The colon potocol was followed with the Bovie and suction being removed as well as the LigaSure. New drapes were placed over the top.  The On-Q tunnelers were placed in the preperitoneal space along either side of the fascial incision. The antibiotic irrigation was then used in the abdomen. The fascia was closed with running #1 looped PDS sutures 2. The On-Q catheters were then advanced through the tunnel where she. The tunnelers were removed. An income dressing was placed on the midline wound and the On-Q catheters were dressed in the standard fashion.  The patient was allowed to emerge from anesthesia and was taken to the PACU in stable condition. Needle, sponge, and instrument counts were correct 2.

## 2016-06-06 NOTE — Anesthesia Postprocedure Evaluation (Signed)
Anesthesia Post Note  Patient: Zachary Cook  Procedure(s) Performed: Procedure(s) (LRB): OPEN ASCENDING COLECTOMY (N/A)  Patient location during evaluation: PACU Anesthesia Type: General Level of consciousness: awake and alert Pain management: pain level controlled Vital Signs Assessment: post-procedure vital signs reviewed and stable Respiratory status: spontaneous breathing, nonlabored ventilation, respiratory function stable and patient connected to nasal cannula oxygen Cardiovascular status: blood pressure returned to baseline and stable Postop Assessment: no signs of nausea or vomiting Anesthetic complications: no       Last Vitals:  Vitals:   06/06/16 1012 06/06/16 1015  BP: 134/86   Pulse: 84 84  Resp: 17 16  Temp:      Last Pain:  Vitals:   06/06/16 1000  TempSrc:   PainSc: 5                  Montez Hageman

## 2016-06-06 NOTE — Interval H&P Note (Signed)
History and Physical Interval Note:  06/06/2016 7:33 AM  Zachary Cook  has presented today for surgery, with the diagnosis of GI BLEED  The various methods of treatment have been discussed with the patient and family. After consideration of risks, benefits and other options for treatment, the patient has consented to  Procedure(s): OPEN ASCENDING COLECTOMY (N/A) as a surgical intervention .  The patient's history has been reviewed, patient examined, no change in status, stable for surgery.  I have reviewed the patient's chart and labs.  Questions were answered to the patient's satisfaction.  I discussed that there is a reasonable chance of continued bleeding.     Karene Bracken

## 2016-06-06 NOTE — Anesthesia Preprocedure Evaluation (Addendum)
Anesthesia Evaluation  Patient identified by MRN, date of birth, ID band Patient awake    Reviewed: Allergy & Precautions, NPO status , Patient's Chart, lab work & pertinent test results  Airway Mallampati: II  TM Distance: >3 FB Neck ROM: Full    Dental no notable dental hx.    Pulmonary neg pulmonary ROS,    Pulmonary exam normal breath sounds clear to auscultation       Cardiovascular negative cardio ROS Normal cardiovascular exam Rhythm:Regular Rate:Normal     Neuro/Psych negative neurological ROS  negative psych ROS   GI/Hepatic Neg liver ROS, hiatal hernia, GERD  ,  Endo/Other  diabetes, Type 2  Renal/GU negative Renal ROS  negative genitourinary   Musculoskeletal negative musculoskeletal ROS (+)   Abdominal   Peds negative pediatric ROS (+)  Hematology  (+) anemia ,   Anesthesia Other Findings   Reproductive/Obstetrics negative OB ROS                            Anesthesia Physical Anesthesia Plan  ASA: II  Anesthesia Plan: General   Post-op Pain Management:    Induction: Intravenous  Airway Management Planned: Oral ETT  Additional Equipment:   Intra-op Plan:   Post-operative Plan: Extubation in OR  Informed Consent: I have reviewed the patients History and Physical, chart, labs and discussed the procedure including the risks, benefits and alternatives for the proposed anesthesia with the patient or authorized representative who has indicated his/her understanding and acceptance.   Dental advisory given  Plan Discussed with: CRNA  Anesthesia Plan Comments:         Anesthesia Quick Evaluation

## 2016-06-06 NOTE — Anesthesia Procedure Notes (Signed)
Procedure Name: Intubation Date/Time: 06/06/2016 7:50 AM Performed by: Chantry Headen, Virgel Gess Pre-anesthesia Checklist: Patient identified, Emergency Drugs available, Suction available, Patient being monitored and Timeout performed Patient Re-evaluated:Patient Re-evaluated prior to inductionOxygen Delivery Method: Circle system utilized Preoxygenation: Pre-oxygenation with 100% oxygen Intubation Type: IV induction Ventilation: Mask ventilation without difficulty Laryngoscope Size: Mac and 4 Grade View: Grade I Tube type: Oral Tube size: 7.5 mm Number of attempts: 1 Airway Equipment and Method: Stylet Placement Confirmation: ETT inserted through vocal cords under direct vision,  positive ETCO2,  CO2 detector and breath sounds checked- equal and bilateral Secured at: 22 cm Tube secured with: Tape Dental Injury: Teeth and Oropharynx as per pre-operative assessment

## 2016-06-06 NOTE — Transfer of Care (Signed)
Immediate Anesthesia Transfer of Care Note  Patient: Zachary Cook  Procedure(s) Performed: Procedure(s): OPEN ASCENDING COLECTOMY (N/A)  Patient Location: PACU  Anesthesia Type:General  Level of Consciousness:  sedated, patient cooperative and responds to stimulation  Airway & Oxygen Therapy:Patient Spontanous Breathing and Patient connected to face mask oxgen  Post-op Assessment:  Report given to PACU RN and Post -op Vital signs reviewed and stable  Post vital signs:  Reviewed and stable  Last Vitals:  Vitals:   06/06/16 0500 06/06/16 0600  BP: 100/63 123/64  Pulse: 89 78  Resp: 20 (!) 23  Temp:      Complications: No apparent anesthesia complications

## 2016-06-06 NOTE — Progress Notes (Signed)
Pt back to room from [place] with RN.  VSS.  Pt asleep but easily awakened with Morphine PCA intact.  Family notified.

## 2016-06-06 NOTE — H&P (View-Only) (Signed)
Subjective: No real change, he has not had a BM this AM, so he does not know if he is bleeding but it would seem he is stable right now.    Objective: Vital signs in last 24 hours: Temp:  [97 F (36.1 C)-98.5 F (36.9 C)] 98.5 F (36.9 C) (03/28 1200) Pulse Rate:  [47-99] 87 (03/28 1201) Resp:  [14-29] 20 (03/28 1201) BP: (84-181)/(43-115) 130/62 (03/28 1100) SpO2:  [93 %-100 %] 93 % (03/28 1201) Last BM Date: 06/05/16 240 Po this AM/NPO yesterday 2300 IV 3875 urine yesterday BM x 2 Afebrile, VSS K+ 3.4 H/H  7.9/22.8 Intake/Output from previous day: 03/27 0701 - 03/28 0700 In: 2336.3 [I.V.:1696.3; Blood:640] Out: 3875 [Urine:3875] Intake/Output this shift: Total I/O In: 540 [P.O.:240; I.V.:300] Out: 300 [Urine:300]  General appearance: alert, cooperative and no distress Resp: clear to auscultation bilaterally GI: soft, non-tender; bowel sounds normal; no masses,  no organomegaly  Lab Results:   Recent Labs  06/03/16 1328  06/05/16 0326 06/05/16 0726 06/05/16 1153  WBC 13.2*  --  12.4*  --   --   HGB 5.1*  < > 7.4* 7.5* 7.9*  HCT 16.0*  < > 21.1* 22.0* 22.8*  PLT 173  --  124*  --   --   < > = values in this interval not displayed.  BMET  Recent Labs  06/04/16 1619 06/05/16 0326  NA 140  141 138  K 3.3*  3.3* 3.4*  CL 114*  114* 113*  CO2 22  23 21*  GLUCOSE 102*  99 110*  BUN 26*  25* 29*  CREATININE 1.01  1.04 1.17  CALCIUM 7.2*  7.3* 6.9*   PT/INR  Recent Labs  06/03/16 1328  LABPROT 15.4*  INR 1.22     Recent Labs Lab 05/30/16 0500 06/03/16 1328 06/04/16 1619  AST  --  25 19  ALT  --  14* 13*  ALKPHOS  --  40 43  BILITOT  --  0.7 0.9  PROT  --  5.2* 4.9*  ALBUMIN 2.3* 2.5* 2.3*     Lipase  No results found for: LIPASE   Studies/Results: Dg Chest Portable 1 View  Result Date: 06/03/2016 CLINICAL DATA:  83 year old male with the vagal episodes. Subsequent encounter. EXAM: PORTABLE CHEST 1 VIEW COMPARISON:   05/28/2016 chest x-ray FINDINGS: Cardiomegaly. Calcified tortuous aorta. No infiltrate, congestive heart failure or pneumothorax. No plain film evidence of pulmonary malignancy. Right central line removed. IMPRESSION: Cardiomegaly. Calcified tortuous aorta. No infiltrate or congestive heart failure. Electronically Signed   By: Genia Del M.D.   On: 06/03/2016 14:47    Medications: . Chlorhexidine Gluconate Cloth  6 each Topical Daily  . insulin aspart  0-15 Units Subcutaneous Q4H  . levothyroxine  125 mcg Oral QAC breakfast  . mouth rinse  15 mL Mouth Rinse BID  . peg 3350 powder  0.5 kit Oral Once   And  . [START ON 06/06/2016] peg 3350 powder  0.5 kit Oral Once  . sodium chloride flush  10-40 mL Intracatheter Q12H   . sodium chloride 75 mL/hr at 06/05/16 1300   Assessment/Plan Recurrent acute lower GI bleed. Status post appear mesenteric arteriogram and superselective coil embolization of active bleeding, 05/28/16 Dr. Vernard Gambles IR. Transfused 5 units of PRBC yesterday =>> Hbg: 6.6 > 7.3 > 7.9 >.5.1 > 7.2 >  7.9 Acute on chronic kidney disease.- Creatinine 1.51 =>>1.17 Type 2 diabetes mellitus. GERD. Anemia - H/H: 5.1/16 FEN:  IV fluids/clears. ID:  no Abx DVT:  SCD's added  Plan:  GI is going to try and do a colonoscopy tomorrow.  If they cannot do this we will plan a partial colectomy tomorrow.    He is NPO after MN.     LOS: 2 days    Zackari Ruane 06/05/2016 [phone]  

## 2016-06-06 NOTE — Progress Notes (Signed)
Pt is S/P open right hemicolectomy today. GI signing off. GI available if needed.

## 2016-06-06 NOTE — Progress Notes (Signed)
PROGRESS NOTE [facility]   Zachary Cook   HYQ:[id number] DOB: Aug 30, 1932  DOA: 06/03/2016 PCP: No PCP Per Patient   Brief Narrative:  38 ? with medical history significant of T2DM, Barrett's and diverticulosis  Admit 3/36 Recently admitted for GI bleed with Coil embolization 3/20. Patient was recently discharged after being treated for GI bleed with coil embolization of the distal branch of SMA on 05/28/2016. In the emergency room patient had a large bloody bowel movement and he was found to have hemoglobin of 5.1. Admitted for GI bleed and surgical evaluation.    Subjective:  Back from Hemi-colectomy Sleepy but rouses to some extent Nursing   Assessment & Plan: Lower GI due to ceacal and asc colonic diverticulosis - with re-bleeding from previous coil, placed on 05/28/16 Anemia of acute blood loss  s/p Open R Hemicolectomy 3/29 s/p 5 units of PRBC's--Has had bleeding since 3/28 am when Hb 7.9 transfuse 2 more Unit 3/28 s/p 1 unit FFP  Hemoglobin peri-op up to 9.1--labs am and d/c q 4 hemoglobin GI, IR and surgery consulted appreciate recommendations  Rest as per gen surgery  Acute on CKD III - likely due to hypoperfusion Baseline Cr 1.37 Placed post-op on LR 75 cc/h Labs am  Elevated BP - w/o diagnosis of HTN - likely due to high volume transfusion  Will decrease IVF rate to 75 cc/hr One dose of Lasix given between transfusion, will add another dose after last unit Started hydralazine 10 mg q 4 prn for BP>160 sys Adding clonidine patch 0.2 mg  DM type 2 Continue to hold home medication  Monitor CGB's --197-242  SSI q4  For now  Hypothyroidism  Continue Levothyroxine 125 mcg daily--changed 3/29 to IV formulation for now   DVT prophylaxis: SCD's  Code Status: FULL  Family Communication: Wife at bedside and son at bedside Disposition Plan: SDU today--rest as per gen surgery  Consultants:   GI  Surgery   IR  Procedures:   None     Objective: Vitals:   06/06/16 1000 06/06/16 1012 06/06/16 1015 06/06/16 1100  BP: (!) 157/100 134/86  (!) 174/98  Pulse: 81 84 84 81  Resp: 14 17 16  (!) 23  Temp:      TempSrc:      SpO2: 100% 94% 97% 98%  Weight:      Height:        Intake/Output Summary (Last 24 hours) at 06/06/16 1145 Last data filed at 06/06/16 0923  Gross per 24 hour  Intake             4095 ml  Output             1225 ml  Net             2870 ml   Filed Weights   06/03/16 1243 06/03/16 1630  Weight: 79.4 kg (175 lb) 77.1 kg (169 lb 15.6 oz)    Examination:  General exam: Appears calm sleeping.  On PCA HEENT: Pale conjunctiva, OP moist  Respiratory system: Anterior clear to auscultation. No wheezes in axilla Cardiovascular system: S1 & S2 heard, RRR. No JVD, murmurs, rubs or gallops Gastrointestinal system: Abdomen not examined Central nervous system: sleepy   Data Reviewed: I have personally reviewed following labs and imaging studies  CBC:  Recent Labs Lab 05/31/16 0516 06/03/16 1328  06/05/16 0326 06/05/16 0726 06/05/16 1153 06/05/16 1531 06/06/16 0026 06/06/16 0428  WBC 6.6 13.2*  --  12.4*  --   --   --   --   --  NEUTROABS 3.7 10.3*  --  8.7*  --   --   --   --   --   HGB 7.9* 5.1*  < > 7.4* 7.5* 7.9* 7.1* 9.9* 9.1*  HCT 23.5* 16.0*  < > 21.1* 22.0* 22.8* 20.4* 28.8* 26.4*  MCV 88.7 92.5  --  85.8  --   --   --   --   --   PLT 134* 173  --  124*  --   --   --   --   --   < > = values in this interval not displayed. Basic Metabolic Panel:  Recent Labs Lab 05/31/16 0516 06/03/16 1328 06/04/16 1619 06/05/16 0326  NA 140 139 140  141 138  K 4.7 4.6 3.3*  3.3* 3.4*  CL 113* 112* 114*  114* 113*  CO2 23 20* 22  23 21*  GLUCOSE 133* 264* 102*  99 110*  BUN 21* 24* 26*  25* 29*  CREATININE 1.43* 1.51* 1.01  1.04 1.17  CALCIUM 8.4* 7.6* 7.2*  7.3* 6.9*   GFR: Estimated Creatinine Clearance: 48.4 mL/min (by C-G formula based on SCr of 1.17 mg/dL). Liver  Function Tests:  Recent Labs Lab 06/03/16 1328 06/04/16 1619  AST 25 19  ALT 14* 13*  ALKPHOS 40 43  BILITOT 0.7 0.9  PROT 5.2* 4.9*  ALBUMIN 2.5* 2.3*   Coagulation Profile:  Recent Labs Lab 06/03/16 1328 06/06/16 0026  INR 1.22 1.12   Cardiac Enzymes: No results for input(s): CKTOTAL, CKMB, CKMBINDEX, TROPONINI in the last 168 hours. CBG:  Recent Labs Lab 06/05/16 1614 06/05/16 1947 06/05/16 2306 06/06/16 0309 06/06/16 0953  GLUCAP 197* 164* 132* 103* 209*   No results for input(s): PROCALCITON, LATICACIDVEN in the last 168 hours.  Recent Results (from the past 240 hour(s))  MRSA PCR Screening     Status: None   Collection Time: 05/28/16  2:59 AM  Result Value Ref Range Status   MRSA by PCR NEGATIVE NEGATIVE Final    Comment:        The GeneXpert MRSA Assay (FDA approved for NASAL specimens only), is one component of a comprehensive MRSA colonization surveillance program. It is not intended to diagnose MRSA infection nor to guide or monitor treatment for MRSA infections.   Culture, blood (Routine X 2) w Reflex to ID Panel     Status: None   Collection Time: 05/28/16  4:16 PM  Result Value Ref Range Status   Specimen Description BLOOD BLOOD RIGHT ARM  Final   Special Requests BOTTLES DRAWN AEROBIC AND ANAEROBIC 7 CC EA  Final   Culture   Final    NO GROWTH 5 DAYS Performed at [facility], [place] [address], [place], [place] [address]    Report Status 06/02/2016 FINAL  Final  Culture, blood (Routine X 2) w Reflex to ID Panel     Status: None   Collection Time: 05/28/16  4:26 PM  Result Value Ref Range Status   Specimen Description BLOOD BLOOD LEFT HAND  Final   Special Requests BOTTLES DRAWN AEROBIC AND ANAEROBIC 5.5 CC EA  Final   Culture   Final    NO GROWTH 5 DAYS Performed at [facility], [place] [address] [place], [place] [address]    Report Status 06/02/2016 FINAL  Final     Radiology Studies: No results found.  Scheduled  Meds: . cefoTEtan in Dextrose 5%      . Chlorhexidine Gluconate Cloth  6 each Topical Daily  . cloNIDine  0.2 mg Transdermal Weekly  . fentaNYL      . insulin aspart  0-15 Units Subcutaneous Q4H  . levothyroxine  75 mcg Intravenous Daily  . mouth rinse  15 mL Mouth Rinse BID  . morphine   Intravenous Q4H  . polyethylene glycol  34 g Oral Once   Continuous Infusions:    LOS: 3 days    Verneita Griffes, MD [facility] (P) [phone]   If 7PM-7AM, please contact night-coverage [email or web address] Password [email or web address] 06/06/2016, 11:45 AM

## 2016-06-06 NOTE — Progress Notes (Signed)
Chaplain providing emotional support with pt's son, [name], whom we know through [employer].  [name] is hopeful that his father's surgery will resolve GI bleeding.

## 2016-06-07 ENCOUNTER — Encounter (HOSPITAL_COMMUNITY): Payer: Self-pay | Admitting: General Surgery

## 2016-06-07 LAB — CBC WITH DIFFERENTIAL/PLATELET
BASOS ABS: 0 10*3/uL (ref 0.0–0.1)
Basophils Relative: 0 %
Eosinophils Absolute: 0 10*3/uL (ref 0.0–0.7)
Eosinophils Relative: 0 %
HEMATOCRIT: 28.9 % — AB (ref 39.0–52.0)
Hemoglobin: 9.9 g/dL — ABNORMAL LOW (ref 13.0–17.0)
LYMPHS ABS: 1.7 10*3/uL (ref 0.7–4.0)
LYMPHS PCT: 10 %
MCH: 29.3 pg (ref 26.0–34.0)
MCHC: 34.3 g/dL (ref 30.0–36.0)
MCV: 85.5 fL (ref 78.0–100.0)
Monocytes Absolute: 1.3 10*3/uL — ABNORMAL HIGH (ref 0.1–1.0)
Monocytes Relative: 8 %
NEUTROS ABS: 14 10*3/uL — AB (ref 1.7–7.7)
Neutrophils Relative %: 82 %
Platelets: 143 10*3/uL — ABNORMAL LOW (ref 150–400)
RBC: 3.38 MIL/uL — ABNORMAL LOW (ref 4.22–5.81)
RDW: 17.2 % — AB (ref 11.5–15.5)
WBC: 17.1 10*3/uL — ABNORMAL HIGH (ref 4.0–10.5)

## 2016-06-07 LAB — TYPE AND SCREEN
ABO/RH(D): A POS
ANTIBODY SCREEN: NEGATIVE
UNIT DIVISION: 0
UNIT DIVISION: 0
UNIT DIVISION: 0
UNIT DIVISION: 0
UNIT DIVISION: 0
Unit division: 0
Unit division: 0
Unit division: 0
Unit division: 0

## 2016-06-07 LAB — RENAL FUNCTION PANEL
ALBUMIN: 2.1 g/dL — AB (ref 3.5–5.0)
ANION GAP: 5 (ref 5–15)
BUN: 19 mg/dL (ref 6–20)
CHLORIDE: 113 mmol/L — AB (ref 101–111)
CO2: 21 mmol/L — AB (ref 22–32)
Calcium: 7.8 mg/dL — ABNORMAL LOW (ref 8.9–10.3)
Creatinine, Ser: 1.09 mg/dL (ref 0.61–1.24)
GFR calc Af Amer: >60 mL/min (ref >60)
GFR calc non Af Amer: >60 mL/min (ref >60)
Glucose, Bld: 136 mg/dL — ABNORMAL HIGH (ref 65–99)
POTASSIUM: 4.1 mmol/L (ref 3.5–5.1)
Phosphorus: 3 mg/dL (ref 2.5–4.6)
Sodium: 139 mmol/L (ref 135–145)

## 2016-06-07 LAB — BPAM RBC
BLOOD PRODUCT EXPIRATION DATE: 201804162359
BLOOD PRODUCT EXPIRATION DATE: 201804172359
BLOOD PRODUCT EXPIRATION DATE: 201804172359
BLOOD PRODUCT EXPIRATION DATE: 201804172359
Blood Product Expiration Date: 201804162359
Blood Product Expiration Date: 201804172359
Blood Product Expiration Date: 201804172359
Blood Product Expiration Date: 201804172359
Blood Product Expiration Date: 201804172359
ISSUE DATE / TIME: 201803261432
ISSUE DATE / TIME: 201803261628
ISSUE DATE / TIME: 201803262109
ISSUE DATE / TIME: 201803281618
ISSUE DATE / TIME: 201803281954
ISSUE DATE / TIME: 201803270419
ISSUE DATE / TIME: 201803270820
ISSUE DATE / TIME: 201803290944
UNIT TYPE AND RH: 6200
UNIT TYPE AND RH: 6200
UNIT TYPE AND RH: 6200
UNIT TYPE AND RH: 6200
Unit Type and Rh: 6200
Unit Type and Rh: 6200
Unit Type and Rh: 6200
Unit Type and Rh: 6200
Unit Type and Rh: 6200

## 2016-06-07 LAB — GLUCOSE, CAPILLARY
GLUCOSE-CAPILLARY: 130 mg/dL — AB (ref 65–99)
Glucose-Capillary: 114 mg/dL — ABNORMAL HIGH (ref 65–99)
Glucose-Capillary: 179 mg/dL — ABNORMAL HIGH (ref 65–99)
Glucose-Capillary: 182 mg/dL — ABNORMAL HIGH (ref 65–99)
Glucose-Capillary: 176 mg/dL — ABNORMAL HIGH (ref 65–99)

## 2016-06-07 NOTE — Progress Notes (Signed)
PROGRESS NOTE [facility]   Zachary Cook   AJG:[id number] DOB: [birth year]  DOA: 06/03/2016 PCP: No PCP Per Patient   Brief Narrative:  59 ? with medical history significant of T2DM, Barrett's and diverticulosis  Admit 3/36 Recently admitted for GI bleed with Coil embolization 3/20. Patient was recently discharged after being treated for GI bleed with coil embolization of the distal branch of SMA on 05/28/2016. In the emergency room patient had a large bloody bowel movement and he was found to have hemoglobin of 5.1. Admitted for GI bleed and surgical evaluation.    Subjective:  Pain mod controlled In nad No n/v No cp Eating some clear ans tol no vomit No overt fever No gas in bag  Assessment & Plan: Lower GI due to ceacal and asc colonic diverticulosis - with re-bleeding from previous coil, placed on 05/28/16 Anemia of acute blood loss  s/p Open R Hemicolectomy 3/29 s/p 7 units of PRBC's since admit + s/p 1 unit FFP  Hemoglobin peri-op up to 9.9--labs am and d/c q 4 hemoglobin GI, IR and surgery consulted appreciate recommendations  WBC 17 probably 2/2 post op stress Rest as per gen surgery Emphasized IS regularily  Acute on CKD III - likely due to hypoperfusion Baseline Cr 1.09 Placed post-op on LR 75 cc/h Labs am  Elevated BP - w/o diagnosis of HTN - likely due to high volume transfusion  IVF rate to 75 cc/hr One dose of Lasix given between transfusion, will add another dose after last unit Started hydralazine 10 mg q 4 prn for BP>160 sys Adding clonidine patch 0.2 mg Will adjust blood pressure medications accordingly  DM type 2 Continue to hold home medication  Monitor CGB's -- 136-139 SSI q4  For now  Hypothyroidism  Continue Levothyroxine 125 mcg daily--changed 3/29 to IV formulation for now   DVT prophylaxis: SCD's  Code Status: FULL  Family Communication: no family present Disposition Plan: SDU today-->can tx to med surg today as per gen  surgery  Consultants:   GI  Surgery   IR  Procedures:   None    Objective: Vitals:   06/07/16 0331 06/07/16 0400 06/07/16 0500 06/07/16 0600  BP:  133/80 (!) 151/66 (!) 145/64  Pulse:  78 78 73  Resp:  (!) 21 16 14   Temp: 98.1 F (36.7 C)     TempSrc: Oral     SpO2:  98% 97% 98%  Weight:      Height:        Intake/Output Summary (Last 24 hours) at 06/07/16 0800 Last data filed at 06/07/16 0600  Gross per 24 hour  Intake             4400 ml  Output             1300 ml  Net             3100 ml   Filed Weights   06/03/16 1243 06/03/16 1630  Weight: 79.4 kg (175 lb) 77.1 kg (169 lb 15.6 oz)    Examination:  General exam: Appears calm sleeping.  On PCA HEENT: Pale conjunctiva, OP moist  Respiratory system: Anterior clear to auscultation. No wheezes in axilla Cardiovascular system: S1 & S2 heard, RRR. No JVD, murmurs, rubs or gallops Gastrointestinal system: Abdomen not examined Central nervous system: sleepy   Data Reviewed: I have personally reviewed following labs and imaging studies  CBC:  Recent Labs Lab 06/03/16 1328  06/05/16 0326  06/06/16 0026 06/06/16  5852 06/06/16 1146 06/06/16 1551 06/07/16 0405  WBC 13.2*  --  12.4*  --   --   --   --   --  17.1*  NEUTROABS 10.3*  --  8.7*  --   --   --   --   --  14.0*  HGB 5.1*  < > 7.4*  < > 9.9* 9.1* 10.3* 9.7* 9.9*  HCT 16.0*  < > 21.1*  < > 28.8* 26.4* 30.1* 28.2* 28.9*  MCV 92.5  --  85.8  --   --   --   --   --  85.5  PLT 173  --  124*  --   --   --   --   --  143*  < > = values in this interval not displayed. Basic Metabolic Panel:  Recent Labs Lab 06/03/16 1328 06/04/16 1619 06/05/16 0326 06/07/16 0405  NA 139 140  141 138 139  K 4.6 3.3*  3.3* 3.4* 4.1  CL 112* 114*  114* 113* 113*  CO2 20* 22  23 21* 21*  GLUCOSE 264* 102*  99 110* 136*  BUN 24* 26*  25* 29* 19  CREATININE 1.51* 1.01  1.04 1.17 1.09  CALCIUM 7.6* 7.2*  7.3* 6.9* 7.8*  PHOS  --   --   --  3.0    GFR: Estimated Creatinine Clearance: 51.9 mL/min (by C-G formula based on SCr of 1.09 mg/dL). Liver Function Tests:  Recent Labs Lab 06/03/16 1328 06/04/16 1619 06/07/16 0405  AST 25 19  --   ALT 14* 13*  --   ALKPHOS 40 43  --   BILITOT 0.7 0.9  --   PROT 5.2* 4.9*  --   ALBUMIN 2.5* 2.3* 2.1*   Coagulation Profile:  Recent Labs Lab 06/03/16 1328 06/06/16 0026  INR 1.22 1.12   Cardiac Enzymes: No results for input(s): CKTOTAL, CKMB, CKMBINDEX, TROPONINI in the last 168 hours. CBG:  Recent Labs Lab 06/06/16 1150 06/06/16 1600 06/06/16 1937 06/06/16 2316 06/07/16 0329  GLUCAP 242* 197* 161* 139* 130*   No results for input(s): PROCALCITON, LATICACIDVEN in the last 168 hours.  Recent Results (from the past 240 hour(s))  Culture, blood (Routine X 2) w Reflex to ID Panel     Status: None   Collection Time: 05/28/16  4:16 PM  Result Value Ref Range Status   Specimen Description BLOOD BLOOD RIGHT ARM  Final   Special Requests BOTTLES DRAWN AEROBIC AND ANAEROBIC 7 CC EA  Final   Culture   Final    NO GROWTH 5 DAYS Performed at [facility], [address], [place], [place] [address]    Report Status 06/02/2016 FINAL  Final  Culture, blood (Routine X 2) w Reflex to ID Panel     Status: None   Collection Time: 05/28/16  4:26 PM  Result Value Ref Range Status   Specimen Description BLOOD BLOOD LEFT HAND  Final   Special Requests BOTTLES DRAWN AEROBIC AND ANAEROBIC 5.5 CC EA  Final   Culture   Final    NO GROWTH 5 DAYS Performed at [facility], [place] [address], [place], [place] [address]    Report Status 06/02/2016 FINAL  Final     Radiology Studies: No results found.  Scheduled Meds: . Chlorhexidine Gluconate Cloth  6 each Topical Daily  . cloNIDine  0.2 mg Transdermal Weekly  . insulin aspart  0-15 Units Subcutaneous Q4H  . levothyroxine  75  mcg Intravenous Daily  . mouth rinse  15 mL Mouth Rinse BID  . morphine   Intravenous Q4H  .  polyethylene glycol  34 g Oral Once   Continuous Infusions: . lactated ringers 75 mL/hr at 06/07/16 0600     LOS: 4 days    Verneita Griffes, MD [facility] (P) [phone]   If 7PM-7AM, please contact night-coverage [email or web address] Password [email or web address] 06/07/2016, 8:00 AM

## 2016-06-07 NOTE — Progress Notes (Signed)
Patient ID: Zachary Cook, male   DOB: May 21, 1932, 83 y.o.   MRN: [id number] [facility] Surgery Progress Note:   1 Day Post-Op  Subjective: Mental status is fairly clear;  Resting well;  No pain Objective: Vital signs in last 24 hours: Temp:  [97.8 F (36.6 C)-98.4 F (36.9 C)] 98.2 F (36.8 C) (03/30 0800) Pulse Rate:  [73-95] 93 (03/30 1000) Resp:  [14-26] 23 (03/30 1000) BP: (108-198)/(59-115) 131/59 (03/30 1000) SpO2:  [92 %-99 %] 97 % (03/30 1000)  Intake/Output from previous day: 03/29 0701 - 03/30 0700 In: 4400 [P.O.:350; I.V.:3450; Blood:350; IV Piggyback:250] Out: 1300 [Urine:1250; Blood:50] Intake/Output this shift: Total I/O In: 225 [I.V.:225] Out: -   Physical Exam: Work of breathing is not labored;    Lab Results:  Results for orders placed or performed during the hospital encounter of 06/03/16 (from the past 48 hour(s))  Glucose, capillary     Status: Abnormal   Collection Time: 06/05/16 11:26 AM  Result Value Ref Range   Glucose-Capillary 109 (H) 65 - 99 mg/dL  Hemoglobin and hematocrit, blood     Status: Abnormal   Collection Time: 06/05/16 11:53 AM  Result Value Ref Range   Hemoglobin 7.9 (L) 13.0 - 17.0 g/dL   HCT 22.8 (L) 39.0 - 52.0 %  Prepare RBC     Status: None   Collection Time: 06/05/16  3:00 PM  Result Value Ref Range   Order Confirmation ORDER PROCESSED BY BLOOD BANK   Hemoglobin and hematocrit, blood     Status: Abnormal   Collection Time: 06/05/16  3:31 PM  Result Value Ref Range   Hemoglobin 7.1 (L) 13.0 - 17.0 g/dL   HCT 20.4 (L) 39.0 - 52.0 %  Glucose, capillary     Status: Abnormal   Collection Time: 06/05/16  4:12 PM  Result Value Ref Range   Glucose-Capillary 236 (H) 65 - 99 mg/dL  Glucose, capillary     Status: Abnormal   Collection Time: 06/05/16  4:14 PM  Result Value Ref Range   Glucose-Capillary 197 (H) 65 - 99 mg/dL  Glucose, capillary     Status: Abnormal   Collection Time: 06/05/16  7:47 PM  Result Value Ref  Range   Glucose-Capillary 164 (H) 65 - 99 mg/dL   Comment 1 Notify RN    Comment 2 Document in Chart   Glucose, capillary     Status: Abnormal   Collection Time: 06/05/16 11:06 PM  Result Value Ref Range   Glucose-Capillary 132 (H) 65 - 99 mg/dL   Comment 1 Notify RN    Comment 2 Document in Chart   Hemoglobin and hematocrit, blood     Status: Abnormal   Collection Time: 06/06/16 12:26 AM  Result Value Ref Range   Hemoglobin 9.9 (L) 13.0 - 17.0 g/dL    Comment: RESULT REPEATED AND VERIFIED DELTA CHECK NOTED POST TRANSFUSION SPECIMEN    HCT 28.8 (L) 39.0 - 52.0 %  Protime-INR     Status: None   Collection Time: 06/06/16 12:26 AM  Result Value Ref Range   Prothrombin Time 14.4 11.4 - 15.2 seconds   INR 1.12   Glucose, capillary     Status: Abnormal   Collection Time: 06/06/16  3:09 AM  Result Value Ref Range   Glucose-Capillary 103 (H) 65 - 99 mg/dL   Comment 1 Notify RN    Comment 2 Document in Chart   Hemoglobin and hematocrit, blood     Status: Abnormal  Collection Time: 06/06/16  4:28 AM  Result Value Ref Range   Hemoglobin 9.1 (L) 13.0 - 17.0 g/dL   HCT 79.7 (L) 53.8 - 72.3 %  Glucose, capillary     Status: Abnormal   Collection Time: 06/06/16  9:53 AM  Result Value Ref Range   Glucose-Capillary 209 (H) 65 - 99 mg/dL  Hemoglobin and hematocrit, blood     Status: Abnormal   Collection Time: 06/06/16 11:46 AM  Result Value Ref Range   Hemoglobin 10.3 (L) 13.0 - 17.0 g/dL   HCT 57.5 (L) 02.6 - 80.6 %  Glucose, capillary     Status: Abnormal   Collection Time: 06/06/16 11:50 AM  Result Value Ref Range   Glucose-Capillary 242 (H) 65 - 99 mg/dL   Comment 1 Notify RN    Comment 2 Document in Chart   Hemoglobin and hematocrit, blood     Status: Abnormal   Collection Time: 06/06/16  3:51 PM  Result Value Ref Range   Hemoglobin 9.7 (L) 13.0 - 17.0 g/dL   HCT 14.1 (L) 49.2 - 43.5 %  Glucose, capillary     Status: Abnormal   Collection Time: 06/06/16  4:00 PM  Result  Value Ref Range   Glucose-Capillary 197 (H) 65 - 99 mg/dL  Glucose, capillary     Status: Abnormal   Collection Time: 06/06/16  7:37 PM  Result Value Ref Range   Glucose-Capillary 161 (H) 65 - 99 mg/dL   Comment 1 Notify RN    Comment 2 Document in Chart   Glucose, capillary     Status: Abnormal   Collection Time: 06/06/16 11:16 PM  Result Value Ref Range   Glucose-Capillary 139 (H) 65 - 99 mg/dL   Comment 1 Notify RN    Comment 2 Document in Chart   Glucose, capillary     Status: Abnormal   Collection Time: 06/07/16  3:29 AM  Result Value Ref Range   Glucose-Capillary 130 (H) 65 - 99 mg/dL   Comment 1 Notify RN    Comment 2 Document in Chart   Renal function panel     Status: Abnormal   Collection Time: 06/07/16  4:05 AM  Result Value Ref Range   Sodium 139 135 - 145 mmol/L   Potassium 4.1 3.5 - 5.1 mmol/L   Chloride 113 (H) 101 - 111 mmol/L   CO2 21 (L) 22 - 32 mmol/L   Glucose, Bld 136 (H) 65 - 99 mg/dL   BUN 19 6 - 20 mg/dL   Creatinine, Ser 2.55 0.61 - 1.24 mg/dL   Calcium 7.8 (L) 8.9 - 10.3 mg/dL   Phosphorus 3.0 2.5 - 4.6 mg/dL   Albumin 2.1 (L) 3.5 - 5.0 g/dL   GFR calc non Af Amer >60 >60 mL/min   GFR calc Af Amer >60 >60 mL/min    Comment: (NOTE) The eGFR has been calculated using the CKD EPI equation. This calculation has not been validated in all clinical situations. eGFR's persistently <60 mL/min signify possible Chronic Kidney Disease.    Anion gap 5 5 - 15  CBC with Differential/Platelet     Status: Abnormal   Collection Time: 06/07/16  4:05 AM  Result Value Ref Range   WBC 17.1 (H) 4.0 - 10.5 K/uL   RBC 3.38 (L) 4.22 - 5.81 MIL/uL   Hemoglobin 9.9 (L) 13.0 - 17.0 g/dL   HCT 18.6 (L) 02.6 - 26.9 %   MCV 85.5 78.0 - 100.0 fL   MCH 29.3  26.0 - 34.0 pg   MCHC 34.3 30.0 - 36.0 g/dL   RDW 17.2 (H) 11.5 - 15.5 %   Platelets 143 (L) 150 - 400 K/uL   Neutrophils Relative % 82 %   Neutro Abs 14.0 (H) 1.7 - 7.7 K/uL   Lymphocytes Relative 10 %   Lymphs  Abs 1.7 0.7 - 4.0 K/uL   Monocytes Relative 8 %   Monocytes Absolute 1.3 (H) 0.1 - 1.0 K/uL   Eosinophils Relative 0 %   Eosinophils Absolute 0.0 0.0 - 0.7 K/uL   Basophils Relative 0 %   Basophils Absolute 0.0 0.0 - 0.1 K/uL    Radiology/Results: No results found.  Anti-infectives: Anti-infectives    Start     Dose/Rate Route Frequency Ordered Stop   06/06/16 0800  clindamycin (CLEOCIN) 900 mg, gentamicin (GARAMYCIN) 240 mg in sodium chloride 0.9 % 1,000 mL for intraperitoneal lavage  Status:  Discontinued      Intraperitoneal To Surgery 06/05/16 2053 06/06/16 1029   06/06/16 0643  cefoTEtan in Dextrose 5% (CEFOTAN) 2-2.08 GM-% IVPB    Comments:  Key, [name]   : cabinet override      06/06/16 0643 06/06/16 1844   06/06/16 0600  cefoTEtan (CEFOTAN) 2 g in dextrose 5 % 50 mL IVPB     2 g 100 mL/hr over 30 Minutes Intravenous On call to O.R. 06/05/16 2053 06/06/16 0754   06/06/16 0200  neomycin (MYCIFRADIN) tablet 1,000 mg     1,000 mg Oral  Once 06/05/16 2107 06/06/16 0152   06/06/16 0200  metroNIDAZOLE (FLAGYL) tablet 1,000 mg     1,000 mg Oral NOW 06/05/16 2107 06/06/16 0152   06/05/16 2300  neomycin (MYCIFRADIN) tablet 1,000 mg  Status:  Discontinued     1,000 mg Oral  Once 06/05/16 2106 06/05/16 2108   06/05/16 2300  metroNIDAZOLE (FLAGYL) tablet 1,000 mg     1,000 mg Oral NOW 06/05/16 2108 06/05/16 2330   06/05/16 2300  neomycin (MYCIFRADIN) tablet 1,000 mg     1,000 mg Oral  Once 06/05/16 2108 06/05/16 2330   06/05/16 2115  neomycin (MYCIFRADIN) tablet 1,000 mg     1,000 mg Oral NOW 06/05/16 2053 06/05/16 2150   06/05/16 2115  metroNIDAZOLE (FLAGYL) tablet 1,000 mg     1,000 mg Oral NOW 06/05/16 2053 06/05/16 2150   06/05/16 2115  metroNIDAZOLE (FLAGYL) tablet 1,000 mg  Status:  Discontinued     1,000 mg Oral NOW 06/05/16 2106 06/05/16 2108      Assessment/Plan: Problem List: Patient Active Problem List   Diagnosis Date Noted  . GI bleed 06/03/2016  . Syncope    . Diverticulosis of colon with hemorrhage   . Hematochezia   . Lower gastrointestinal bleeding   . BRBPR (bright red blood per rectum) 05/27/2016  . Acute blood loss anemia 05/27/2016  . Hypotension due to blood loss 05/27/2016  . AKI (acute kidney injury) ([place]) 05/27/2016  . Hyperkalemia 05/27/2016  . Adult onset hypothyroidism 12/20/2013  . Type II diabetes mellitus, uncontrolled ([place]) 12/20/2013  . Hypogonadism in male 12/02/2013  . Type 2 diabetes mellitus ([place]) 05/09/2011  . Hypertension 05/09/2011  . GERD (gastroesophageal reflux disease) 05/09/2011  . Hypogonadism male 05/09/2011    OK to transfer to floor.  1 Day Post-Op    LOS: 4 days   Matt B. Hassell Done, MD, [facility] [phone] beeper [phone]  06/07/2016 11:13 AM

## 2016-06-07 NOTE — Progress Notes (Signed)
Date:  June 07, 2016 Chart reviewed for concurrent status and case management needs. Will continue to follow patient progress. Sepsis and anemia Discharge Planning: following for needs Expected discharge date: [id number] Velva Harman, BSN, [place], [place]

## 2016-06-08 LAB — CBC WITH DIFFERENTIAL/PLATELET
BASOS ABS: 0 10*3/uL (ref 0.0–0.1)
Basophils Relative: 0 %
Eosinophils Absolute: 0.2 10*3/uL (ref 0.0–0.7)
Eosinophils Relative: 1 %
HEMATOCRIT: 27 % — AB (ref 39.0–52.0)
Hemoglobin: 9.1 g/dL — ABNORMAL LOW (ref 13.0–17.0)
LYMPHS PCT: 12 %
Lymphs Abs: 2 10*3/uL (ref 0.7–4.0)
MCH: 29.4 pg (ref 26.0–34.0)
MCHC: 33.7 g/dL (ref 30.0–36.0)
MCV: 87.4 fL (ref 78.0–100.0)
MONO ABS: 1.5 10*3/uL — AB (ref 0.1–1.0)
Monocytes Relative: 9 %
NEUTROS ABS: 12.9 10*3/uL — AB (ref 1.7–7.7)
Neutrophils Relative %: 78 %
Platelets: 150 10*3/uL (ref 150–400)
RBC: 3.09 MIL/uL — AB (ref 4.22–5.81)
RDW: 16.8 % — AB (ref 11.5–15.5)
WBC: 16.7 10*3/uL — ABNORMAL HIGH (ref 4.0–10.5)

## 2016-06-08 LAB — GLUCOSE, CAPILLARY
GLUCOSE-CAPILLARY: 112 mg/dL — AB (ref 65–99)
GLUCOSE-CAPILLARY: 127 mg/dL — AB (ref 65–99)
GLUCOSE-CAPILLARY: 123 mg/dL — AB (ref 65–99)
Glucose-Capillary: 123 mg/dL — ABNORMAL HIGH (ref 65–99)
Glucose-Capillary: 131 mg/dL — ABNORMAL HIGH (ref 65–99)
Glucose-Capillary: 147 mg/dL — ABNORMAL HIGH (ref 65–99)
Glucose-Capillary: 181 mg/dL — ABNORMAL HIGH (ref 65–99)

## 2016-06-08 NOTE — Progress Notes (Signed)
PROGRESS NOTE [facility]   Norm Wray Henken   ENI:[id number] DOB: 06-23-1932  DOA: 06/03/2016 PCP: No PCP Per Patient   Brief Narrative:   43 ? with medical history significant of T2DM, Barrett's and diverticulosis  Admit 3/36 Recently admitted for GI bleed with Coil embolization 3/20. Patient was recently discharged after being treated for GI bleed with coil embolization of the distal branch of SMA on 05/28/2016. In the emergency room patient had a large bloody bowel movement and he was found to have hemoglobin of 5.1. Admitted for GI bleed and surgical evaluation.    Subjective:  More alert and pleasant Eating and drinking some No gas in bag Abd pain is mangeable  Assessment & Plan:  Lower GI due to ceacal and asc colonic diverticulosis - with re-bleeding from previous coil, placed on 05/28/16 Anemia of acute blood loss  s/p Open R Hemicolectomy 3/29 s/p 7 units of PRBC's since admit + s/p 1 unit FFP  Hemoglobin peri-op up to 9.9--labs am and d/c q 4 hemoglobin GI, IR and surgery consulted appreciate recommendations  WBC 17-->16.7 probably 2/2 post op stress Pain management as per surgery Emphasized IS regularily  Acute on CKD III - likely due to hypoperfusion Baseline Cr 1.09  on LR 50 cc/h Labs am  Elevated BP - w/o diagnosis of HTN - likely due to high volume transfusion   IVF rate to 75 cc/hr-->50 cc/hr One dose of Lasix given between transfusion, will add another dose after last unit Started hydralazine 10 mg q 4 prn for BP>160 sys Adding clonidine patch 0.2 mg Blood pressure trending 140's.   Will adjust further meds with further readigs in next couple of days  DM type 2 Continue to hold home medication  Monitor CGB's -- 112-181 SSI q4  For now  Hypothyroidism  Continue Levothyroxine 125 mcg daily--changed 3/29 to IV formulation for now   DVT prophylaxis: SCD's  Code Status: FULL  Family Communication: d/w wife Disposition Plan: SDU  today-->can tx to med surg today as per gen surgery  Consultants:   GI  Surgery   IR  Procedures:   None    Objective: Vitals:   06/07/16 2045 06/08/16 0400 06/08/16 0428 06/08/16 0800  BP: 133/74  (!) 144/74   Pulse: 73  85   Resp: 18 18 18 17   Temp: 97.6 F (36.4 C)  98.8 F (37.1 C)   TempSrc: Oral  Oral   SpO2: 99% 98% 98% 98%  Weight:      Height:        Intake/Output Summary (Last 24 hours) at 06/08/16 0933 Last data filed at 06/08/16 0428  Gross per 24 hour  Intake           1867.5 ml  Output              875 ml  Net            992.5 ml   Filed Weights   06/03/16 1243 06/03/16 1630  Weight: 79.4 kg (175 lb) 77.1 kg (169 lb 15.6 oz)    Examination:  General exam: Appears calm HEENT: Pale conjunctiva, OP moist  Respiratory system: Anterior clear to auscultation. No wheezes in axilla Cardiovascular system: S1 & S2 heard, RRR. No JVD, murmurs, rubs or gallops Gastrointestinal system: Abdomen benign-no stool, gas in bag, foley still in Central nervous system: sleepy   Data Reviewed: I have personally reviewed following labs and imaging studies  CBC:  Recent Labs Lab 06/03/16  1328  06/05/16 0326  06/06/16 0428 06/06/16 1146 06/06/16 1551 06/07/16 0405 06/08/16 0444  WBC 13.2*  --  12.4*  --   --   --   --  17.1* 16.7*  NEUTROABS 10.3*  --  8.7*  --   --   --   --  14.0* 12.9*  HGB 5.1*  < > 7.4*  < > 9.1* 10.3* 9.7* 9.9* 9.1*  HCT 16.0*  < > 21.1*  < > 26.4* 30.1* 28.2* 28.9* 27.0*  MCV 92.5  --  85.8  --   --   --   --  85.5 87.4  PLT 173  --  124*  --   --   --   --  143* 150  < > = values in this interval not displayed. Basic Metabolic Panel:  Recent Labs Lab 06/03/16 1328 06/04/16 1619 06/05/16 0326 06/07/16 0405  NA 139 140  141 138 139  K 4.6 3.3*  3.3* 3.4* 4.1  CL 112* 114*  114* 113* 113*  CO2 20* 22  23 21* 21*  GLUCOSE 264* 102*  99 110* 136*  BUN 24* 26*  25* 29* 19  CREATININE 1.51* 1.01  1.04 1.17 1.09    CALCIUM 7.6* 7.2*  7.3* 6.9* 7.8*  PHOS  --   --   --  3.0   GFR: Estimated Creatinine Clearance: 51.9 mL/min (by C-G formula based on SCr of 1.09 mg/dL). Liver Function Tests:  Recent Labs Lab 06/03/16 1328 06/04/16 1619 06/07/16 0405  AST 25 19  --   ALT 14* 13*  --   ALKPHOS 40 43  --   BILITOT 0.7 0.9  --   PROT 5.2* 4.9*  --   ALBUMIN 2.5* 2.3* 2.1*   Coagulation Profile:  Recent Labs Lab 06/03/16 1328 06/06/16 0026  INR 1.22 1.12   Cardiac Enzymes: No results for input(s): CKTOTAL, CKMB, CKMBINDEX, TROPONINI in the last 168 hours. CBG:  Recent Labs Lab 06/07/16 1528 06/07/16 2046 06/08/16 0008 06/08/16 0421 06/08/16 0803  GLUCAP 182* 176* 181* 131* 112*   No results for input(s): PROCALCITON, LATICACIDVEN in the last 168 hours.  No results found for this or any previous visit (from the past 240 hour(s)).   Radiology Studies: No results found.  Scheduled Meds: . Chlorhexidine Gluconate Cloth  6 each Topical Daily  . cloNIDine  0.2 mg Transdermal Weekly  . insulin aspart  0-15 Units Subcutaneous Q4H  . levothyroxine  75 mcg Intravenous Daily  . mouth rinse  15 mL Mouth Rinse BID  . morphine   Intravenous Q4H  . polyethylene glycol  34 g Oral Once   Continuous Infusions: . lactated ringers 75 mL/hr at 06/08/16 0314     LOS: 5 days    Verneita Griffes, MD [facility] (P) [phone]   If 7PM-7AM, please contact night-coverage [email or web address] Password [facility] 06/08/2016, 9:33 AM

## 2016-06-08 NOTE — Progress Notes (Signed)
Patient ID: Zachary Cook, male   DOB: 1932/09/07, 83 y.o.   MRN: [id number] [facility] Progress Note:   2 Days Post-Op  Subjective: Mental status is clear.  Transferred to the floor yesterday.   Objective: Vital signs in last 24 hours: Temp:  [97.6 F (36.4 C)-98.9 F (37.2 C)] 98.8 F (37.1 C) (03/31 0428) Pulse Rate:  [73-113] 85 (03/31 0428) Resp:  [13-23] 17 (03/31 0800) BP: (108-172)/(59-110) 144/74 (03/31 0428) SpO2:  [88 %-99 %] 98 % (03/31 0800)  Intake/Output from previous day: 03/30 0701 - 03/31 0700 In: 2092.5 [P.O.:500; I.V.:1592.5] Out: 875 [Urine:875] Intake/Output this shift: No intake/output data recorded.  Physical Exam: Work of breathing is normal.  Incision OK.  No flatus yet.    Lab Results:  Results for orders placed or performed during the hospital encounter of 06/03/16 (from the past 48 hour(s))  Glucose, capillary     Status: Abnormal   Collection Time: 06/06/16  9:53 AM  Result Value Ref Range   Glucose-Capillary 209 (H) 65 - 99 mg/dL  Hemoglobin and hematocrit, blood     Status: Abnormal   Collection Time: 06/06/16 11:46 AM  Result Value Ref Range   Hemoglobin 10.3 (L) 13.0 - 17.0 g/dL   HCT 30.1 (L) 39.0 - 52.0 %  Glucose, capillary     Status: Abnormal   Collection Time: 06/06/16 11:50 AM  Result Value Ref Range   Glucose-Capillary 242 (H) 65 - 99 mg/dL   Comment 1 Notify RN    Comment 2 Document in Chart   Hemoglobin and hematocrit, blood     Status: Abnormal   Collection Time: 06/06/16  3:51 PM  Result Value Ref Range   Hemoglobin 9.7 (L) 13.0 - 17.0 g/dL   HCT 28.2 (L) 39.0 - 52.0 %  Glucose, capillary     Status: Abnormal   Collection Time: 06/06/16  4:00 PM  Result Value Ref Range   Glucose-Capillary 197 (H) 65 - 99 mg/dL  Glucose, capillary     Status: Abnormal   Collection Time: 06/06/16  7:37 PM  Result Value Ref Range   Glucose-Capillary 161 (H) 65 - 99 mg/dL   Comment 1 Notify RN    Comment 2 Document in  Chart   Glucose, capillary     Status: Abnormal   Collection Time: 06/06/16 11:16 PM  Result Value Ref Range   Glucose-Capillary 139 (H) 65 - 99 mg/dL   Comment 1 Notify RN    Comment 2 Document in Chart   Glucose, capillary     Status: Abnormal   Collection Time: 06/07/16  3:29 AM  Result Value Ref Range   Glucose-Capillary 130 (H) 65 - 99 mg/dL   Comment 1 Notify RN    Comment 2 Document in Chart   Renal function panel     Status: Abnormal   Collection Time: 06/07/16  4:05 AM  Result Value Ref Range   Sodium 139 135 - 145 mmol/L   Potassium 4.1 3.5 - 5.1 mmol/L   Chloride 113 (H) 101 - 111 mmol/L   CO2 21 (L) 22 - 32 mmol/L   Glucose, Bld 136 (H) 65 - 99 mg/dL   BUN 19 6 - 20 mg/dL   Creatinine, Ser 1.09 0.61 - 1.24 mg/dL   Calcium 7.8 (L) 8.9 - 10.3 mg/dL   Phosphorus 3.0 2.5 - 4.6 mg/dL   Albumin 2.1 (L) 3.5 - 5.0 g/dL   GFR calc non Af Amer >60 >60 mL/min  GFR calc Af Amer >60 >60 mL/min    Comment: (NOTE) The eGFR has been calculated using the CKD EPI equation. This calculation has not been validated in all clinical situations. eGFR's persistently <60 mL/min signify possible Chronic Kidney Disease.    Anion gap 5 5 - 15  CBC with Differential/Platelet     Status: Abnormal   Collection Time: 06/07/16  4:05 AM  Result Value Ref Range   WBC 17.1 (H) 4.0 - 10.5 K/uL   RBC 3.38 (L) 4.22 - 5.81 MIL/uL   Hemoglobin 9.9 (L) 13.0 - 17.0 g/dL   HCT 41.8 (L) 88.5 - 41.8 %   MCV 85.5 78.0 - 100.0 fL   MCH 29.3 26.0 - 34.0 pg   MCHC 34.3 30.0 - 36.0 g/dL   RDW 25.8 (H) 27.1 - 47.1 %   Platelets 143 (L) 150 - 400 K/uL   Neutrophils Relative % 82 %   Neutro Abs 14.0 (H) 1.7 - 7.7 K/uL   Lymphocytes Relative 10 %   Lymphs Abs 1.7 0.7 - 4.0 K/uL   Monocytes Relative 8 %   Monocytes Absolute 1.3 (H) 0.1 - 1.0 K/uL   Eosinophils Relative 0 %   Eosinophils Absolute 0.0 0.0 - 0.7 K/uL   Basophils Relative 0 %   Basophils Absolute 0.0 0.0 - 0.1 K/uL  Glucose, capillary      Status: Abnormal   Collection Time: 06/07/16  7:44 AM  Result Value Ref Range   Glucose-Capillary 114 (H) 65 - 99 mg/dL   Comment 1 Notify RN    Comment 2 Document in Chart   Glucose, capillary     Status: Abnormal   Collection Time: 06/07/16 11:38 AM  Result Value Ref Range   Glucose-Capillary 179 (H) 65 - 99 mg/dL   Comment 1 Notify RN    Comment 2 Document in Chart   Glucose, capillary     Status: Abnormal   Collection Time: 06/07/16  3:28 PM  Result Value Ref Range   Glucose-Capillary 182 (H) 65 - 99 mg/dL   Comment 1 Notify RN    Comment 2 Document in Chart   Glucose, capillary     Status: Abnormal   Collection Time: 06/07/16  8:46 PM  Result Value Ref Range   Glucose-Capillary 176 (H) 65 - 99 mg/dL  Glucose, capillary     Status: Abnormal   Collection Time: 06/08/16 12:08 AM  Result Value Ref Range   Glucose-Capillary 181 (H) 65 - 99 mg/dL  Glucose, capillary     Status: Abnormal   Collection Time: 06/08/16  4:21 AM  Result Value Ref Range   Glucose-Capillary 131 (H) 65 - 99 mg/dL  CBC with Differential/Platelet     Status: Abnormal   Collection Time: 06/08/16  4:44 AM  Result Value Ref Range   WBC 16.7 (H) 4.0 - 10.5 K/uL   RBC 3.09 (L) 4.22 - 5.81 MIL/uL   Hemoglobin 9.1 (L) 13.0 - 17.0 g/dL   HCT 57.8 (L) 73.9 - 47.6 %   MCV 87.4 78.0 - 100.0 fL   MCH 29.4 26.0 - 34.0 pg   MCHC 33.7 30.0 - 36.0 g/dL   RDW 93.4 (H) 62.5 - 31.0 %   Platelets 150 150 - 400 K/uL   Neutrophils Relative % 78 %   Neutro Abs 12.9 (H) 1.7 - 7.7 K/uL   Lymphocytes Relative 12 %   Lymphs Abs 2.0 0.7 - 4.0 K/uL   Monocytes Relative 9 %   Monocytes  Absolute 1.5 (H) 0.1 - 1.0 K/uL   Eosinophils Relative 1 %   Eosinophils Absolute 0.2 0.0 - 0.7 K/uL   Basophils Relative 0 %   Basophils Absolute 0.0 0.0 - 0.1 K/uL  Glucose, capillary     Status: Abnormal   Collection Time: 06/08/16  8:03 AM  Result Value Ref Range   Glucose-Capillary 112 (H) 65 - 99 mg/dL    Radiology/Results: No  results found.  Anti-infectives: Anti-infectives    Start     Dose/Rate Route Frequency Ordered Stop   06/06/16 0800  clindamycin (CLEOCIN) 900 mg, gentamicin (GARAMYCIN) 240 mg in sodium chloride 0.9 % 1,000 mL for intraperitoneal lavage  Status:  Discontinued      Intraperitoneal To Surgery 06/05/16 2053 06/06/16 1029   06/06/16 0643  cefoTEtan in Dextrose 5% (CEFOTAN) 2-2.08 GM-% IVPB    Comments:  Key, [name]   : cabinet override      06/06/16 0643 06/06/16 1844   06/06/16 0600  cefoTEtan (CEFOTAN) 2 g in dextrose 5 % 50 mL IVPB     2 g 100 mL/hr over 30 Minutes Intravenous On call to O.R. 06/05/16 2053 06/06/16 0754   06/06/16 0200  neomycin (MYCIFRADIN) tablet 1,000 mg     1,000 mg Oral  Once 06/05/16 2107 06/06/16 0152   06/06/16 0200  metroNIDAZOLE (FLAGYL) tablet 1,000 mg     1,000 mg Oral NOW 06/05/16 2107 06/06/16 0152   06/05/16 2300  neomycin (MYCIFRADIN) tablet 1,000 mg  Status:  Discontinued     1,000 mg Oral  Once 06/05/16 2106 06/05/16 2108   06/05/16 2300  metroNIDAZOLE (FLAGYL) tablet 1,000 mg     1,000 mg Oral NOW 06/05/16 2108 06/05/16 2330   06/05/16 2300  neomycin (MYCIFRADIN) tablet 1,000 mg     1,000 mg Oral  Once 06/05/16 2108 06/05/16 2330   06/05/16 2115  neomycin (MYCIFRADIN) tablet 1,000 mg     1,000 mg Oral NOW 06/05/16 2053 06/05/16 2150   06/05/16 2115  metroNIDAZOLE (FLAGYL) tablet 1,000 mg     1,000 mg Oral NOW 06/05/16 2053 06/05/16 2150   06/05/16 2115  metroNIDAZOLE (FLAGYL) tablet 1,000 mg  Status:  Discontinued     1,000 mg Oral NOW 06/05/16 2106 06/05/16 2108      Assessment/Plan: Problem List: Patient Active Problem List   Diagnosis Date Noted  . GI bleed 06/03/2016  . Syncope   . Diverticulosis of colon with hemorrhage   . Hematochezia   . Lower gastrointestinal bleeding   . BRBPR (bright red blood per rectum) 05/27/2016  . Acute blood loss anemia 05/27/2016  . Hypotension due to blood loss 05/27/2016  . AKI (acute kidney  injury) ([place]) 05/27/2016  . Hyperkalemia 05/27/2016  . Adult onset hypothyroidism 12/20/2013  . Type II diabetes mellitus, uncontrolled ([place]) 12/20/2013  . Hypogonadism in male 12/02/2013  . Type 2 diabetes mellitus ([place]) 05/09/2011  . Hypertension 05/09/2011  . GERD (gastroesophageal reflux disease) 05/09/2011  . Hypogonadism male 05/09/2011    Post right colectomy for bleeding;  Stable and doing well thus far.   2 Days Post-Op    LOS: 5 days   Matt B. Hassell Done, MD, [facility] [phone] beeper [phone]  06/08/2016 8:38 AM

## 2016-06-08 NOTE — Progress Notes (Signed)
Spoke with Dr. Hassell Done about discontinuing foley. Per surgeon, it was OK to d/c foley. Foley cath removed. Pt tolerated removal well. VWilliams,rn.

## 2016-06-09 LAB — GLUCOSE, CAPILLARY
GLUCOSE-CAPILLARY: 143 mg/dL — AB (ref 65–99)
GLUCOSE-CAPILLARY: 174 mg/dL — AB (ref 65–99)
GLUCOSE-CAPILLARY: 221 mg/dL — AB (ref 65–99)
GLUCOSE-CAPILLARY: 257 mg/dL — AB (ref 65–99)
Glucose-Capillary: 160 mg/dL — ABNORMAL HIGH (ref 65–99)

## 2016-06-09 LAB — CBC
HCT: 28.2 % — ABNORMAL LOW (ref 39.0–52.0)
Hemoglobin: 9.4 g/dL — ABNORMAL LOW (ref 13.0–17.0)
MCH: 29.3 pg (ref 26.0–34.0)
MCHC: 33.3 g/dL (ref 30.0–36.0)
MCV: 87.9 fL (ref 78.0–100.0)
PLATELETS: 200 10*3/uL (ref 150–400)
RBC: 3.21 MIL/uL — ABNORMAL LOW (ref 4.22–5.81)
RDW: 16.2 % — AB (ref 11.5–15.5)
WBC: 16.2 10*3/uL — AB (ref 4.0–10.5)

## 2016-06-09 LAB — BASIC METABOLIC PANEL
Anion gap: 9 (ref 5–15)
BUN: 14 mg/dL (ref 6–20)
CHLORIDE: 105 mmol/L (ref 101–111)
CO2: 22 mmol/L (ref 22–32)
Calcium: 8.4 mg/dL — ABNORMAL LOW (ref 8.9–10.3)
Creatinine, Ser: 1.15 mg/dL (ref 0.61–1.24)
GFR calc non Af Amer: 58 mL/min — ABNORMAL LOW (ref >60)
Glucose, Bld: 158 mg/dL — ABNORMAL HIGH (ref 65–99)
POTASSIUM: 4.3 mmol/L (ref 3.5–5.1)
SODIUM: 136 mmol/L (ref 135–145)

## 2016-06-09 LAB — CBC WITH DIFFERENTIAL/PLATELET
Basophils Absolute: 0 10*3/uL (ref 0.0–0.1)
Basophils Relative: 0 %
EOS ABS: 0.1 10*3/uL (ref 0.0–0.7)
Eosinophils Relative: 1 %
HCT: 29.7 % — ABNORMAL LOW (ref 39.0–52.0)
HEMOGLOBIN: 9.8 g/dL — AB (ref 13.0–17.0)
LYMPHS ABS: 1.8 10*3/uL (ref 0.7–4.0)
LYMPHS PCT: 11 %
MCH: 29.2 pg (ref 26.0–34.0)
MCHC: 33 g/dL (ref 30.0–36.0)
MCV: 88.4 fL (ref 78.0–100.0)
Monocytes Absolute: 1 10*3/uL (ref 0.1–1.0)
Monocytes Relative: 6 %
NEUTROS PCT: 82 %
Neutro Abs: 13.7 10*3/uL — ABNORMAL HIGH (ref 1.7–7.7)
Platelets: 195 10*3/uL (ref 150–400)
RBC: 3.36 MIL/uL — AB (ref 4.22–5.81)
RDW: 16.2 % — ABNORMAL HIGH (ref 11.5–15.5)
WBC: 16.7 10*3/uL — AB (ref 4.0–10.5)

## 2016-06-09 MED ORDER — KCL IN DEXTROSE-NACL 20-5-0.45 MEQ/L-%-% IV SOLN
INTRAVENOUS | Status: DC
  Administered 2016-06-09 (×2): via INTRAVENOUS
  Filled 2016-06-09 (×3): qty 1000

## 2016-06-09 MED ORDER — PROMETHAZINE HCL 25 MG/ML IJ SOLN
12.5000 mg | Freq: Four times a day (QID) | INTRAMUSCULAR | Status: DC | PRN
  Administered 2016-06-09 – 2016-06-11 (×2): 12.5 mg via INTRAVENOUS
  Filled 2016-06-09 (×2): qty 1

## 2016-06-09 NOTE — Progress Notes (Signed)
PCA discontinued. 16 ml of unused morphine  in syringe wasted in sink. Unable to waste in pyxis as there were no options to waste against.  Witnessed by Alease Medina, RN.

## 2016-06-09 NOTE — Progress Notes (Signed)
Patient ID: Zachary Cook, male   DOB: 07/05/1932, 83 y.o.   MRN: [id number] [facility] Progress Note:   3 Days Post-Op  Subjective: Mental status is clear; nauseated Objective: Vital signs in last 24 hours: Temp:  [97.8 F (36.6 C)-98.6 F (37 C)] 97.8 F (36.6 C) (04/01 0817) Pulse Rate:  [78-94] 94 (04/01 0817) Resp:  [16-20] 18 (04/01 0817) BP: (137-183)/(82-95) 148/82 (04/01 0817) SpO2:  [94 %-99 %] 97 % (04/01 0817)  Intake/Output from previous day: 03/31 0701 - 04/01 0700 In: 1746.3 [P.O.:240; I.V.:1506.3] Out: 750 [Urine:750] Intake/Output this shift: No intake/output data recorded.  Physical Exam: Work of breathing is not labored.  Greenish vomitus this am.    Lab Results:  Results for orders placed or performed during the hospital encounter of 06/03/16 (from the past 48 hour(s))  Glucose, capillary     Status: Abnormal   Collection Time: 06/07/16 11:38 AM  Result Value Ref Range   Glucose-Capillary 179 (H) 65 - 99 mg/dL   Comment 1 Notify RN    Comment 2 Document in Chart   Glucose, capillary     Status: Abnormal   Collection Time: 06/07/16  3:28 PM  Result Value Ref Range   Glucose-Capillary 182 (H) 65 - 99 mg/dL   Comment 1 Notify RN    Comment 2 Document in Chart   Glucose, capillary     Status: Abnormal   Collection Time: 06/07/16  8:46 PM  Result Value Ref Range   Glucose-Capillary 176 (H) 65 - 99 mg/dL  Glucose, capillary     Status: Abnormal   Collection Time: 06/08/16 12:08 AM  Result Value Ref Range   Glucose-Capillary 181 (H) 65 - 99 mg/dL  Glucose, capillary     Status: Abnormal   Collection Time: 06/08/16  4:21 AM  Result Value Ref Range   Glucose-Capillary 131 (H) 65 - 99 mg/dL  CBC with Differential/Platelet     Status: Abnormal   Collection Time: 06/08/16  4:44 AM  Result Value Ref Range   WBC 16.7 (H) 4.0 - 10.5 K/uL   RBC 3.09 (L) 4.22 - 5.81 MIL/uL   Hemoglobin 9.1 (L) 13.0 - 17.0 g/dL   HCT 27.0 (L) 39.0 - 52.0 %   MCV 87.4 78.0 - 100.0 fL   MCH 29.4 26.0 - 34.0 pg   MCHC 33.7 30.0 - 36.0 g/dL   RDW 16.8 (H) 11.5 - 15.5 %   Platelets 150 150 - 400 K/uL   Neutrophils Relative % 78 %   Neutro Abs 12.9 (H) 1.7 - 7.7 K/uL   Lymphocytes Relative 12 %   Lymphs Abs 2.0 0.7 - 4.0 K/uL   Monocytes Relative 9 %   Monocytes Absolute 1.5 (H) 0.1 - 1.0 K/uL   Eosinophils Relative 1 %   Eosinophils Absolute 0.2 0.0 - 0.7 K/uL   Basophils Relative 0 %   Basophils Absolute 0.0 0.0 - 0.1 K/uL  Glucose, capillary     Status: Abnormal   Collection Time: 06/08/16  8:03 AM  Result Value Ref Range   Glucose-Capillary 112 (H) 65 - 99 mg/dL  Glucose, capillary     Status: Abnormal   Collection Time: 06/08/16 11:09 AM  Result Value Ref Range   Glucose-Capillary 127 (H) 65 - 99 mg/dL  Glucose, capillary     Status: Abnormal   Collection Time: 06/08/16  4:44 PM  Result Value Ref Range   Glucose-Capillary 123 (H) 65 - 99 mg/dL  Glucose, capillary  Status: Abnormal   Collection Time: 06/08/16  8:03 PM  Result Value Ref Range   Glucose-Capillary 123 (H) 65 - 99 mg/dL  Glucose, capillary     Status: Abnormal   Collection Time: 06/08/16 11:49 PM  Result Value Ref Range   Glucose-Capillary 147 (H) 65 - 99 mg/dL   Comment 1 Notify RN    Comment 2 Document in Chart   Glucose, capillary     Status: Abnormal   Collection Time: 06/09/16  3:59 AM  Result Value Ref Range   Glucose-Capillary 160 (H) 65 - 99 mg/dL   Comment 1 Notify RN    Comment 2 Document in Chart   Basic metabolic panel     Status: Abnormal   Collection Time: 06/09/16  4:50 AM  Result Value Ref Range   Sodium 136 135 - 145 mmol/L   Potassium 4.3 3.5 - 5.1 mmol/L   Chloride 105 101 - 111 mmol/L   CO2 22 22 - 32 mmol/L   Glucose, Bld 158 (H) 65 - 99 mg/dL   BUN 14 6 - 20 mg/dL   Creatinine, Ser 1.15 0.61 - 1.24 mg/dL   Calcium 8.4 (L) 8.9 - 10.3 mg/dL   GFR calc non Af Amer 58 (L) >60 mL/min   GFR calc Af Amer >60 >60 mL/min    Comment:  (NOTE) The eGFR has been calculated using the CKD EPI equation. This calculation has not been validated in all clinical situations. eGFR's persistently <60 mL/min signify possible Chronic Kidney Disease.    Anion gap 9 5 - 15  CBC with Differential/Platelet     Status: Abnormal   Collection Time: 06/09/16  4:50 AM  Result Value Ref Range   WBC 16.7 (H) 4.0 - 10.5 K/uL   RBC 3.36 (L) 4.22 - 5.81 MIL/uL   Hemoglobin 9.8 (L) 13.0 - 17.0 g/dL   HCT 29.7 (L) 39.0 - 52.0 %   MCV 88.4 78.0 - 100.0 fL   MCH 29.2 26.0 - 34.0 pg   MCHC 33.0 30.0 - 36.0 g/dL   RDW 16.2 (H) 11.5 - 15.5 %   Platelets 195 150 - 400 K/uL   Neutrophils Relative % 82 %   Neutro Abs 13.7 (H) 1.7 - 7.7 K/uL   Lymphocytes Relative 11 %   Lymphs Abs 1.8 0.7 - 4.0 K/uL   Monocytes Relative 6 %   Monocytes Absolute 1.0 0.1 - 1.0 K/uL   Eosinophils Relative 1 %   Eosinophils Absolute 0.1 0.0 - 0.7 K/uL   Basophils Relative 0 %   Basophils Absolute 0.0 0.0 - 0.1 K/uL  Glucose, capillary     Status: Abnormal   Collection Time: 06/09/16  7:31 AM  Result Value Ref Range   Glucose-Capillary 143 (H) 65 - 99 mg/dL    Radiology/Results: No results found.  Anti-infectives: Anti-infectives    Start     Dose/Rate Route Frequency Ordered Stop   06/06/16 0800  clindamycin (CLEOCIN) 900 mg, gentamicin (GARAMYCIN) 240 mg in sodium chloride 0.9 % 1,000 mL for intraperitoneal lavage  Status:  Discontinued      Intraperitoneal To Surgery 06/05/16 2053 06/06/16 1029   06/06/16 0643  cefoTEtan in Dextrose 5% (CEFOTAN) 2-2.08 GM-% IVPB    Comments:  Key, [name]   : cabinet override      06/06/16 0643 06/06/16 1844   06/06/16 0600  cefoTEtan (CEFOTAN) 2 g in dextrose 5 % 50 mL IVPB     2 g 100 mL/hr over 30  Minutes Intravenous On call to O.R. 06/05/16 2053 06/06/16 0754   06/06/16 0200  neomycin (MYCIFRADIN) tablet 1,000 mg     1,000 mg Oral  Once 06/05/16 2107 06/06/16 0152   06/06/16 0200  metroNIDAZOLE (FLAGYL) tablet  1,000 mg     1,000 mg Oral NOW 06/05/16 2107 06/06/16 0152   06/05/16 2300  neomycin (MYCIFRADIN) tablet 1,000 mg  Status:  Discontinued     1,000 mg Oral  Once 06/05/16 2106 06/05/16 2108   06/05/16 2300  metroNIDAZOLE (FLAGYL) tablet 1,000 mg     1,000 mg Oral NOW 06/05/16 2108 06/05/16 2330   06/05/16 2300  neomycin (MYCIFRADIN) tablet 1,000 mg     1,000 mg Oral  Once 06/05/16 2108 06/05/16 2330   06/05/16 2115  neomycin (MYCIFRADIN) tablet 1,000 mg     1,000 mg Oral NOW 06/05/16 2053 06/05/16 2150   06/05/16 2115  metroNIDAZOLE (FLAGYL) tablet 1,000 mg     1,000 mg Oral NOW 06/05/16 2053 06/05/16 2150   06/05/16 2115  metroNIDAZOLE (FLAGYL) tablet 1,000 mg  Status:  Discontinued     1,000 mg Oral NOW 06/05/16 2106 06/05/16 2108      Assessment/Plan: Problem List: Patient Active Problem List   Diagnosis Date Noted  . GI bleed 06/03/2016  . Syncope   . Diverticulosis of colon with hemorrhage   . Hematochezia   . Lower gastrointestinal bleeding   . BRBPR (bright red blood per rectum) 05/27/2016  . Acute blood loss anemia 05/27/2016  . Hypotension due to blood loss 05/27/2016  . AKI (acute kidney injury) ([place]) 05/27/2016  . Hyperkalemia 05/27/2016  . Adult onset hypothyroidism 12/20/2013  . Type II diabetes mellitus, uncontrolled ([place]) 12/20/2013  . Hypogonadism in male 12/02/2013  . Type 2 diabetes mellitus ([place]) 05/09/2011  . Hypertension 05/09/2011  . GERD (gastroesophageal reflux disease) 05/09/2011  . Hypogonadism male 05/09/2011    Will increase IV fluids and slow down on PO intake.   3 Days Post-Op    LOS: 6 days   Matt B. Hassell Done, MD, [facility] [phone] beeper [phone]  06/09/2016 9:02 AM

## 2016-06-09 NOTE — Progress Notes (Signed)
PROGRESS NOTE [facility]   Zachary Cook   GUY:[id number] DOB: [birth year]  DOA: 06/03/2016 PCP: No PCP Per Patient   Brief Narrative:   54 ? with medical history significant of T2DM, Barrett's and diverticulosis  Admit 3/36 Recently admitted for GI bleed with Coil embolization 3/20. Patient was recently discharged after being treated for GI bleed with coil embolization of the distal branch of SMA on 05/28/2016. In the emergency room patient had a large bloody bowel movement and he was found to have hemoglobin of 5.1. Admitted for GI bleed and surgical evaluation.    Subjective:  Sleepy - just got IV phenergan Greenish emesis this am Ruling out bloody vomit and getting CBC   Assessment & Plan:  Lower GI due to ceacal and asc colonic diverticulosis - with re-bleeding from previous coil, placed on 05/28/16 Anemia of acute blood loss  s/p Open R Hemicolectomy 3/29 s/p 7 units of PRBC's since admit + s/p 1 unit FFP  Hemoglobin peri-op up to 9.9--labs am and d/c q 4 hemoglobin GI, IR and surgery consulted appreciate recommendations  WBC 17-->16.7 probably 2/2 post op stress Pain management as per surgery Emphasized IS regularily Slow diet 4/1 given vomit 06/09/16, place on IVF d5/0.45 125 cc/h  Acute on CKD III - likely due to hypoperfusion Baseline Cr 1.09--slght up 1.15 Labs am  Elevated BP - w/o diagnosis of HTN - likely due to high volume transfusion   IVF rate to 75 cc/hr-->50 cc/hr One dose of Lasix given between transfusion, will add another dose after last unit Started hydralazine 10 mg q 4 prn for BP>160 sys Adding clonidine patch 0.2 mg Blood pressure trending 140's.   Will adjust further meds with further readigs in next couple of days  DM type 2 Continue to hold home medication  Monitor CGB's -- [phone] SSI q4  For now  Hypothyroidism  Continue Levothyroxine 125 mcg daily--changed 3/29 to IV formulation for now   DVT prophylaxis: SCD's  Code  Status: FULL  Family Communication: d/w wife Disposition Plan: SDU today-->can tx to med surg today as per gen surgery  Consultants:   GI  Surgery   IR  Procedures:   None    Objective: Vitals:   06/09/16 0400 06/09/16 0530 06/09/16 0800 06/09/16 0817  BP:  137/87  (!) 148/82  Pulse:  93  94  Resp: 20 19 18 18   Temp:  98.2 F (36.8 C)  97.8 F (36.6 C)  TempSrc:  Oral  Oral  SpO2:  97%  97%  Weight:      Height:        Intake/Output Summary (Last 24 hours) at 06/09/16 1053 Last data filed at 06/09/16 0600  Gross per 24 hour  Intake          1746.25 ml  Output              750 ml  Net           996.25 ml   Filed Weights   06/03/16 1243 06/03/16 1630  Weight: 79.4 kg (175 lb) 77.1 kg (169 lb 15.6 oz)    Examination:  General exam: sleepy HEENT: Pale conjunctiva, OP moist  abd soft     Data Reviewed: I have personally reviewed following labs and imaging studies  CBC:  Recent Labs Lab 06/03/16 1328  06/05/16 0326  06/06/16 1146 06/06/16 1551 06/07/16 0405 06/08/16 0444 06/09/16 0450  WBC 13.2*  --  12.4*  --   --   --  17.1* 16.7* 16.7*  NEUTROABS 10.3*  --  8.7*  --   --   --  14.0* 12.9* 13.7*  HGB 5.1*  < > 7.4*  < > 10.3* 9.7* 9.9* 9.1* 9.8*  HCT 16.0*  < > 21.1*  < > 30.1* 28.2* 28.9* 27.0* 29.7*  MCV 92.5  --  85.8  --   --   --  85.5 87.4 88.4  PLT 173  --  124*  --   --   --  143* 150 195  < > = values in this interval not displayed. Basic Metabolic Panel:  Recent Labs Lab 06/03/16 1328 06/04/16 1619 06/05/16 0326 06/07/16 0405 06/09/16 0450  NA 139 140  141 138 139 136  K 4.6 3.3*  3.3* 3.4* 4.1 4.3  CL 112* 114*  114* 113* 113* 105  CO2 20* 22  23 21* 21* 22  GLUCOSE 264* 102*  99 110* 136* 158*  BUN 24* 26*  25* 29* 19 14  CREATININE 1.51* 1.01  1.04 1.17 1.09 1.15  CALCIUM 7.6* 7.2*  7.3* 6.9* 7.8* 8.4*  PHOS  --   --   --  3.0  --    GFR: Estimated Creatinine Clearance: 49.2 mL/min (by C-G formula based on  SCr of 1.15 mg/dL). Liver Function Tests:  Recent Labs Lab 06/03/16 1328 06/04/16 1619 06/07/16 0405  AST 25 19  --   ALT 14* 13*  --   ALKPHOS 40 43  --   BILITOT 0.7 0.9  --   PROT 5.2* 4.9*  --   ALBUMIN 2.5* 2.3* 2.1*   Coagulation Profile:  Recent Labs Lab 06/03/16 1328 06/06/16 0026  INR 1.22 1.12   Cardiac Enzymes: No results for input(s): CKTOTAL, CKMB, CKMBINDEX, TROPONINI in the last 168 hours. CBG:  Recent Labs Lab 06/08/16 1644 06/08/16 2003 06/08/16 2349 06/09/16 0359 06/09/16 0731  GLUCAP 123* 123* 147* 160* 143*   No results for input(s): PROCALCITON, LATICACIDVEN in the last 168 hours.  No results found for this or any previous visit (from the past 240 hour(s)).   Radiology Studies: No results found.  Scheduled Meds: . Chlorhexidine Gluconate Cloth  6 each Topical Daily  . cloNIDine  0.2 mg Transdermal Weekly  . insulin aspart  0-15 Units Subcutaneous Q4H  . levothyroxine  75 mcg Intravenous Daily  . mouth rinse  15 mL Mouth Rinse BID  . polyethylene glycol  34 g Oral Once   Continuous Infusions: . dextrose 5 % and 0.45 % NaCl with KCl 20 mEq/L 100 mL/hr at 06/09/16 0921     LOS: 6 days    Verneita Griffes, MD [facility] [phone]   If 7PM-7AM, please contact night-coverage [email or web address] Password [facility] 06/09/2016, 10:53 AM

## 2016-06-09 NOTE — Progress Notes (Signed)
PT Cancellation Note  Patient Details Name: Zachary Cook MRN: [id number] DOB: [birth year]   Cancelled Treatment:     PT order received but eval deferred this am at request of RN - pt nauseous and actively vomiting.  Will follow.   Cynitha Berte 06/09/2016, 8:41 AM

## 2016-06-10 ENCOUNTER — Inpatient Hospital Stay (HOSPITAL_COMMUNITY): Payer: Medicare Other

## 2016-06-10 LAB — COMPREHENSIVE METABOLIC PANEL
ALT: 26 U/L (ref 17–63)
ANION GAP: 4 — AB (ref 5–15)
AST: 48 U/L — AB (ref 15–41)
Albumin: 1.8 g/dL — ABNORMAL LOW (ref 3.5–5.0)
Alkaline Phosphatase: 51 U/L (ref 38–126)
BILIRUBIN TOTAL: 0.5 mg/dL (ref 0.3–1.2)
BUN: 15 mg/dL (ref 6–20)
CHLORIDE: 105 mmol/L (ref 101–111)
CO2: 26 mmol/L (ref 22–32)
Calcium: 8.4 mg/dL — ABNORMAL LOW (ref 8.9–10.3)
Creatinine, Ser: 1.09 mg/dL (ref 0.61–1.24)
GFR calc Af Amer: >60 mL/min (ref >60)
Glucose, Bld: 193 mg/dL — ABNORMAL HIGH (ref 65–99)
POTASSIUM: 4 mmol/L (ref 3.5–5.1)
Sodium: 135 mmol/L (ref 135–145)
Total Protein: 5.1 g/dL — ABNORMAL LOW (ref 6.5–8.1)

## 2016-06-10 LAB — CBC
HEMATOCRIT: 28.1 % — AB (ref 39.0–52.0)
Hemoglobin: 9.3 g/dL — ABNORMAL LOW (ref 13.0–17.0)
MCH: 29.1 pg (ref 26.0–34.0)
MCHC: 33.1 g/dL (ref 30.0–36.0)
MCV: 87.8 fL (ref 78.0–100.0)
PLATELETS: 232 10*3/uL (ref 150–400)
RBC: 3.2 MIL/uL — ABNORMAL LOW (ref 4.22–5.81)
RDW: 16 % — AB (ref 11.5–15.5)
WBC: 14.1 10*3/uL — AB (ref 4.0–10.5)

## 2016-06-10 LAB — GLUCOSE, CAPILLARY
GLUCOSE-CAPILLARY: 162 mg/dL — AB (ref 65–99)
GLUCOSE-CAPILLARY: 177 mg/dL — AB (ref 65–99)
Glucose-Capillary: 162 mg/dL — ABNORMAL HIGH (ref 65–99)
Glucose-Capillary: 186 mg/dL — ABNORMAL HIGH (ref 65–99)
Glucose-Capillary: 196 mg/dL — ABNORMAL HIGH (ref 65–99)
Glucose-Capillary: 197 mg/dL — ABNORMAL HIGH (ref 65–99)

## 2016-06-10 MED ORDER — TAMSULOSIN HCL 0.4 MG PO CAPS
0.4000 mg | ORAL_CAPSULE | Freq: Every day | ORAL | Status: DC
  Administered 2016-06-10 – 2016-06-16 (×7): 0.4 mg via ORAL
  Filled 2016-06-10 (×7): qty 1

## 2016-06-10 MED ORDER — SODIUM CHLORIDE 0.9% FLUSH
10.0000 mL | INTRAVENOUS | Status: DC | PRN
  Administered 2016-06-10 – 2016-06-16 (×10): 10 mL
  Filled 2016-06-10 (×9): qty 40

## 2016-06-10 MED ORDER — LEVOTHYROXINE SODIUM 125 MCG PO TABS
125.0000 ug | ORAL_TABLET | Freq: Every day | ORAL | Status: DC
  Administered 2016-06-11 – 2016-06-16 (×6): 125 ug via ORAL
  Filled 2016-06-10 (×6): qty 1

## 2016-06-10 MED ORDER — SODIUM CHLORIDE 0.9 % IV SOLN
INTRAVENOUS | Status: DC
  Administered 2016-06-10 (×2): via INTRAVENOUS

## 2016-06-10 MED ORDER — SODIUM CHLORIDE 0.9% FLUSH
10.0000 mL | Freq: Two times a day (BID) | INTRAVENOUS | Status: DC
  Administered 2016-06-12 – 2016-06-14 (×6): 10 mL

## 2016-06-10 NOTE — Evaluation (Addendum)
Physical Therapy Evaluation Patient Details Name: Carmelo Reidel MRN: [id number] DOB: 01-14-1933 Today's Date: 06/10/2016   History of Present Illness  83 yo male admitted with GI bleed. S/P hemicolectomy 3/29. Hx of DM, Barrett's esophagus, s/p coil embolization 05/2016  Clinical Impression  On eval, pt was Min guard assist for mobility. He walked ~65 feet with a RW. No family present during session. Pt appeared somewhat confused. Will follow and progress activity as tolerated. Recommend HHPT as long as wife feels she can manage pt at home.     Follow Up Recommendations Home health PT;Supervision/Assistance - 24 hour (if wife cannot manage pt's care, will need SNF)    Equipment Recommendations  Rolling walker with 5" wheels (if pt doesn't have one already)    Recommendations for Other Services       Precautions / Restrictions Precautions Precautions: Fall Precaution Comments: On-Q pump abdomen Restrictions Weight Bearing Restrictions: No      Mobility  Bed Mobility Overal bed mobility: Needs Assistance Bed Mobility: Supine to Sit;Sit to Supine     Supine to sit: Min guard;HOB elevated Sit to supine: Min guard;HOB elevated   General bed mobility comments: close guard for safety/managing lines. Pt used bedrail.   Transfers Overall transfer level: Needs assistance Equipment used: Rolling walker (2 wheeled) Transfers: Sit to/from Stand Sit to Stand: Min guard         General transfer comment: close guard for safety. VCs hand placement  Ambulation/Gait Ambulation/Gait assistance: Min guard Ambulation Distance (Feet): 65 Feet Assistive device: Rolling walker (2 wheeled) Gait Pattern/deviations: Step-through pattern;Decreased stride length     General Gait Details: close guard for safety. Pt c/o bil posterior leg cramping so distance was limited. O2 sats 94% on RA.  Stairs            Wheelchair Mobility    Modified Rankin (Stroke Patients Only)        Balance                                             Pertinent Vitals/Pain Pain Assessment: Faces Faces Pain Scale: Hurts even more Pain Location: bil LE cramping with ambulation    Home Living Family/patient expects to be discharged to:: Private residence Living Arrangements: Spouse/significant other Available Help at Discharge: Family Type of Home: House Home Access: Stairs to enter Entrance Stairs-Rails: Right Entrance Stairs-Number of Steps: 3 Home Layout: One level   Additional Comments: pt is somewhat confused. Will need wife to confirm DME    Prior Function Level of Independence: Needs assistance      ADL's / Homemaking Assistance Needed: pt states he could perform ADLS unassisted.   Comments: he is somewhat confused. He could not tell me if he used any assistive devices. Will need wife to confirm PLOF     Hand Dominance        Extremity/Trunk Assessment   Upper Extremity Assessment Upper Extremity Assessment: Generalized weakness    Lower Extremity Assessment Lower Extremity Assessment: Generalized weakness    Cervical / Trunk Assessment Cervical / Trunk Assessment: Normal  Communication   Communication: HOH  Cognition Arousal/Alertness: Awake/alert Behavior During Therapy: WFL for tasks assessed/performed Overall Cognitive Status: No family/caregiver present to determine baseline cognitive functioning Area of Impairment: Orientation;Memory                 Orientation Level: Time  Memory: Decreased short-term memory                General Comments      Exercises     Assessment/Plan    PT Assessment Patient needs continued PT services  PT Problem List Decreased mobility;Decreased strength;Decreased balance;Decreased activity tolerance;Decreased knowledge of use of DME;Pain       PT Treatment Interventions DME instruction;Gait training;Therapeutic activities;Therapeutic exercise;Patient/family  education;Functional mobility training;Balance training    PT Goals (Current goals can be found in the Care Plan section)  Acute Rehab PT Goals Patient Stated Goal: none stated PT Goal Formulation: With patient Time For Goal Achievement: 06/24/16 Potential to Achieve Goals: Good    Frequency Min 3X/week   Barriers to discharge        Co-evaluation               End of Session Equipment Utilized During Treatment: Gait belt Activity Tolerance: Patient limited by pain Patient left: in bed;with call bell/phone within reach;with bed alarm set   PT Visit Diagnosis: Muscle weakness (generalized) (M62.81);Difficulty in walking, not elsewhere classified (R26.2)    Time: 7207-2182 PT Time Calculation (min) (ACUTE ONLY): 20 min   Charges:   PT Evaluation $PT Eval Low Complexity: 1 Procedure     PT G Codes:          Weston Anna, MPT Pager: [phone]

## 2016-06-10 NOTE — Progress Notes (Signed)
PROGRESS NOTE [facility]   Dinari Stgermaine Whitmill   TIR:[id number] DOB: 1932/10/30  DOA: 06/03/2016 PCP: No PCP Per Patient   Brief Narrative:   13 ? with medical history significant of T2DM, Barrett's and diverticulosis  Admit 3/36 Recently admitted for GI bleed with Coil embolization 3/20. Patient was recently discharged after being treated for GI bleed with coil embolization of the distal branch of SMA on 05/28/2016. In the emergency room patient had a large bloody bowel movement and he was found to have hemoglobin of 5.1. Admitted for GI bleed and surgical evaluation.    Subjective:  No further vomit No flatus No stool some cough No fever no chills   Assessment & Plan:  Lower GI due to ceacal and asc colonic diverticulosis - with re-bleeding from previous coil, placed on 05/28/16 Anemia of acute blood loss  s/p Open R Hemicolectomy 3/29 s/p 7 units of PRBC's since admit + s/p 1 unit FFP  Hemoglobin peri-op up to 9.9--labs am and d/c q 4 hemoglobin GI, IR and surgery consulted appreciate recommendations  Pain management as per surgery-PCA changed 4/1-->on tyelnol  Emphasized IS regularily Slow diet 4/1 given vomit 06/09/16, place on IVF d5/0.45 125 cc/h--now back on clear diet  Acute on CKD III - likely due to hypoperfusion Baseline Cr 1.09  Elevated BP - w/o diagnosis of HTN - likely due to high volume transfusion   IVF rate to 75 cc/hr-->50 cc/hr One dose of Lasix given between transfusion, will add another dose after last unit Started hydralazine 10 mg q 4 prn for BP>160 sys Adding clonidine patch 0.2 mg Blood pressure trending 140's.   Will adjust further meds with further readings in next couple of days  DM type 2 Continue to hold home medication  Monitor CGB's -- 162-196 SSI q4  For now  Hypothyroidism  Continue Levothyroxine 125 mcg daily  Leukocytosis and acute hypoxic resp failure persisting Get 2 vw CXR Has good IS technique and  encouraged desat screen to be repeated  BPH Resume Flomax 0.4 mg    DVT prophylaxis: SCD's  Code Status: FULL  Family Communication: d/w wife 4/2 Disposition Plan: up with therapy-probably will need snf   Consultants:   GI  Surgery   IR  Procedures:   None    Objective: Vitals:   06/09/16 1638 06/09/16 2007 06/10/16 0000 06/10/16 0345  BP: (!) 154/85 (!) 145/70 (!) 150/85 (!) 173/80  Pulse: 88 87 77 78  Resp: 20 20 20 20   Temp: 97.7 F (36.5 C) 98.2 F (36.8 C) 98.6 F (37 C) 98.6 F (37 C)  TempSrc: Oral Oral Oral Oral  SpO2: 95% 94% 98% 98%  Weight:      Height:        Intake/Output Summary (Last 24 hours) at 06/10/16 0933 Last data filed at 06/10/16 0600  Gross per 24 hour  Intake             2080 ml  Output              450 ml  Net             1630 ml   Filed Weights   06/03/16 1243 06/03/16 1630  Weight: 79.4 kg (175 lb) 77.1 kg (169 lb 15.6 oz)    Examination:  General exam: sleepy HEENT: Pale conjunctiva, OP moist  abd soft cta b, no wheeze nor rales or rhonchi     Data Reviewed: I have personally reviewed following labs  and imaging studies  CBC:  Recent Labs Lab 06/03/16 1328  06/05/16 0326  06/07/16 0405 06/08/16 0444 06/09/16 0450 06/09/16 1150 06/10/16 0500  WBC 13.2*  --  12.4*  --  17.1* 16.7* 16.7* 16.2* 14.1*  NEUTROABS 10.3*  --  8.7*  --  14.0* 12.9* 13.7*  --   --   HGB 5.1*  < > 7.4*  < > 9.9* 9.1* 9.8* 9.4* 9.3*  HCT 16.0*  < > 21.1*  < > 28.9* 27.0* 29.7* 28.2* 28.1*  MCV 92.5  --  85.8  --  85.5 87.4 88.4 87.9 87.8  PLT 173  --  124*  --  143* 150 195 200 232  < > = values in this interval not displayed. Basic Metabolic Panel:  Recent Labs Lab 06/04/16 1619 06/05/16 0326 06/07/16 0405 06/09/16 0450 06/10/16 0500  NA 140  141 138 139 136 135  K 3.3*  3.3* 3.4* 4.1 4.3 4.0  CL 114*  114* 113* 113* 105 105  CO2 22  23 21* 21* 22 26  GLUCOSE 102*  99 110* 136* 158* 193*  BUN 26*  25* 29* 19 14  15   CREATININE 1.01  1.04 1.17 1.09 1.15 1.09  CALCIUM 7.2*  7.3* 6.9* 7.8* 8.4* 8.4*  PHOS  --   --  3.0  --   --    GFR: Estimated Creatinine Clearance: 51.9 mL/min (by C-G formula based on SCr of 1.09 mg/dL). Liver Function Tests:  Recent Labs Lab 06/03/16 1328 06/04/16 1619 06/07/16 0405 06/10/16 0500  AST 25 19  --  48*  ALT 14* 13*  --  26  ALKPHOS 40 43  --  51  BILITOT 0.7 0.9  --  0.5  PROT 5.2* 4.9*  --  5.1*  ALBUMIN 2.5* 2.3* 2.1* 1.8*   Coagulation Profile:  Recent Labs Lab 06/03/16 1328 06/06/16 0026  INR 1.22 1.12   Cardiac Enzymes: No results for input(s): CKTOTAL, CKMB, CKMBINDEX, TROPONINI in the last 168 hours. CBG:  Recent Labs Lab 06/09/16 1736 06/09/16 2015 06/10/16 0019 06/10/16 0342 06/10/16 0805  GLUCAP 221* 257* 197* 196* 162*   No results for input(s): PROCALCITON, LATICACIDVEN in the last 168 hours.  No results found for this or any previous visit (from the past 240 hour(s)).   Radiology Studies: No results found.  Scheduled Meds: . Chlorhexidine Gluconate Cloth  6 each Topical Daily  . cloNIDine  0.2 mg Transdermal Weekly  . insulin aspart  0-15 Units Subcutaneous Q4H  . [START ON 06/11/2016] levothyroxine  125 mcg Oral QAC breakfast  . mouth rinse  15 mL Mouth Rinse BID  . polyethylene glycol  34 g Oral Once   Continuous Infusions: . sodium chloride 50 mL/hr at 06/10/16 0506     LOS: 7 days    Verneita Griffes, MD [facility] ([facility]) [phone]   If 7PM-7AM, please contact night-coverage [email or web address] Password [facility] 06/10/2016, 9:33 AM

## 2016-06-10 NOTE — Progress Notes (Signed)
[facility] Progress Note  4 Days Post-Op  Subjective: CC decreased appetite. Nausea improved compared to yesterday. Denies pain at rest but endorses mild abdominal pain with coughing/moving. Tolerating clears but not drinking much. Denies flatus or BM since surgery. Denies dizzienes or SOB.  Objective: Vital signs in last 24 hours: Temp:  [97.7 F (36.5 C)-98.6 F (37 C)] 98.6 F (37 C) (04/02 0345) Pulse Rate:  [77-104] 78 (04/02 0345) Resp:  [20] 20 (04/02 0345) BP: (145-173)/(70-91) 173/80 (04/02 0345) SpO2:  [94 %-100 %] 98 % (04/02 0345) Last BM Date: 06/07/16  Intake/Output from previous day: 04/01 0701 - 04/02 0700 In: 2080 [P.O.:60; I.V.:2020] Out: 450 [Urine:450] Intake/Output this shift: Total I/O In: 140 [P.O.:120; I.V.:20] Out: -   PE: Gen:  Alert, NAD, pleasant Pulm:  Non-labored, clear to auscultation bilaterally Abd: Soft, non-tender, mild distention, bowel sounds present in all 4 quadrants, midline incision c/d/I, staples in place without surrounding erythema or drainage, pain pump present.  Lab Results:   Recent Labs  06/09/16 1150 06/10/16 0500  WBC 16.2* 14.1*  HGB 9.4* 9.3*  HCT 28.2* 28.1*  PLT 200 232   BMET  Recent Labs  06/09/16 0450 06/10/16 0500  NA 136 135  K 4.3 4.0  CL 105 105  CO2 22 26  GLUCOSE 158* 193*  BUN 14 15  CREATININE 1.15 1.09  CALCIUM 8.4* 8.4*   PT/INR No results for input(s): LABPROT, INR in the last 72 hours. CMP     Component Value Date/Time   NA 135 06/10/2016 0500   K 4.0 06/10/2016 0500   CL 105 06/10/2016 0500   CO2 26 06/10/2016 0500   GLUCOSE 193 (H) 06/10/2016 0500   BUN 15 06/10/2016 0500   CREATININE 1.09 06/10/2016 0500   CREATININE 1.35 12/02/2013 1106   CALCIUM 8.4 (L) 06/10/2016 0500   PROT 5.1 (L) 06/10/2016 0500   ALBUMIN 1.8 (L) 06/10/2016 0500   AST 48 (H) 06/10/2016 0500   ALT 26 06/10/2016 0500   ALKPHOS 51 06/10/2016 0500   BILITOT 0.5 06/10/2016 0500   GFRNONAA >60 06/10/2016 0500   GFRNONAA 50 (L) 12/02/2013 1106   GFRAA >60 06/10/2016 0500   GFRAA 57 (L) 12/02/2013 1106   Lipase  No results found for: LIPASE     Studies/Results: Dg Chest 2 View  Result Date: 06/10/2016 CLINICAL DATA:  83 year old diabetic male. Follow-up pneumonia. Subsequent encounter. EXAM: CHEST  2 VIEW COMPARISON:  06/03/2016 and 05/28/2016 chest x-ray. No earlier exams available. FINDINGS: Right PICC line tip mid to distal superior vena cava level. Cardiomegaly. Mild pulmonary edema with small pleural effusions. No segmental consolidation. Limited for excluding basilar infiltrate given the small pleural effusion and minimal basilar atelectasis. Calcified tortuous aorta. Gas-filled colon below left hemidiaphragm. No acute osseous abnormality. IMPRESSION: Cardiomegaly. Mild pulmonary edema. Calcified tortuous aorta. Please see above. Electronically Signed   By: Genia Del M.D.   On: 06/10/2016 10:28    Anti-infectives: Anti-infectives    Start     Dose/Rate Route Frequency Ordered Stop   06/06/16 0800  clindamycin (CLEOCIN) 900 mg, gentamicin (GARAMYCIN) 240 mg in sodium chloride 0.9 % 1,000 mL for intraperitoneal lavage  Status:  Discontinued      Intraperitoneal To Surgery 06/05/16 2053 06/06/16 1029   06/06/16 0643  cefoTEtan in Dextrose 5% (CEFOTAN) 2-2.08 GM-% IVPB    Comments:  Key, [name]   : cabinet override      06/06/16 0643 06/06/16 1844   06/06/16 0600  cefoTEtan (CEFOTAN) 2 g in dextrose 5 % 50 mL IVPB     2 g 100 mL/hr over 30 Minutes Intravenous On call to O.R. 06/05/16 2053 06/06/16 0754   06/06/16 0200  neomycin (MYCIFRADIN) tablet 1,000 mg     1,000 mg Oral  Once 06/05/16 2107 06/06/16 0152   06/06/16 0200  metroNIDAZOLE (FLAGYL) tablet 1,000 mg     1,000 mg Oral NOW 06/05/16 2107 06/06/16 0152   06/05/16 2300  neomycin (MYCIFRADIN) tablet 1,000 mg  Status:  Discontinued     1,000 mg Oral  Once 06/05/16 2106 06/05/16 2108   06/05/16  2300  metroNIDAZOLE (FLAGYL) tablet 1,000 mg     1,000 mg Oral NOW 06/05/16 2108 06/05/16 2330   06/05/16 2300  neomycin (MYCIFRADIN) tablet 1,000 mg     1,000 mg Oral  Once 06/05/16 2108 06/05/16 2330   06/05/16 2115  neomycin (MYCIFRADIN) tablet 1,000 mg     1,000 mg Oral NOW 06/05/16 2053 06/05/16 2150   06/05/16 2115  metroNIDAZOLE (FLAGYL) tablet 1,000 mg     1,000 mg Oral NOW 06/05/16 2053 06/05/16 2150   06/05/16 2115  metroNIDAZOLE (FLAGYL) tablet 1,000 mg  Status:  Discontinued     1,000 mg Oral NOW 06/05/16 2106 06/05/16 2108       Assessment/Plan Lower GIB 2/2 diverticulosis of ascending colon/cecum POD#4 S/P Open right hemicolectomy 06/06/16, Dr. Stark Klein  - issues with post-op nausea/vomiting - clear liquid diet  - return of bowel function**   ABL anemia - stable s/p transfusion; hgb 9.3, hct 28.1  Acute hypoxic respiratory failure  CKD III DM2 Hypothyroidism  BPH  FEN: clear liquid diet,  ID: cefotetan perioperatively VTE: SCD's  Pain: Tylenol PRN   Plan: await bowel function, continue clear liquid diet Encourage IS and out of bed.   LOS: 7 days    [name] , [facility] 06/10/2016, 12:22 PM Pager: [phone] Consults: [phone] Mon-Fri 7:00 am-4:30 pm Sat-Sun 7:00 am-11:30 am

## 2016-06-11 LAB — GLUCOSE, CAPILLARY
GLUCOSE-CAPILLARY: 118 mg/dL — AB (ref 65–99)
GLUCOSE-CAPILLARY: 164 mg/dL — AB (ref 65–99)
Glucose-Capillary: 124 mg/dL — ABNORMAL HIGH (ref 65–99)
Glucose-Capillary: 147 mg/dL — ABNORMAL HIGH (ref 65–99)
Glucose-Capillary: 186 mg/dL — ABNORMAL HIGH (ref 65–99)
Glucose-Capillary: 193 mg/dL — ABNORMAL HIGH (ref 65–99)
Glucose-Capillary: 216 mg/dL — ABNORMAL HIGH (ref 65–99)

## 2016-06-11 LAB — CBC
HCT: 25.8 % — ABNORMAL LOW (ref 39.0–52.0)
Hemoglobin: 8.6 g/dL — ABNORMAL LOW (ref 13.0–17.0)
MCH: 29 pg (ref 26.0–34.0)
MCHC: 33.3 g/dL (ref 30.0–36.0)
MCV: 86.9 fL (ref 78.0–100.0)
Platelets: 241 10*3/uL (ref 150–400)
RBC: 2.97 MIL/uL — ABNORMAL LOW (ref 4.22–5.81)
RDW: 15.5 % (ref 11.5–15.5)
WBC: 11.4 10*3/uL — ABNORMAL HIGH (ref 4.0–10.5)

## 2016-06-11 MED ORDER — FAMOTIDINE IN NACL 20-0.9 MG/50ML-% IV SOLN
20.0000 mg | Freq: Two times a day (BID) | INTRAVENOUS | Status: DC
  Administered 2016-06-11 – 2016-06-12 (×3): 20 mg via INTRAVENOUS
  Filled 2016-06-11 (×3): qty 50

## 2016-06-11 MED ORDER — AMLODIPINE BESYLATE 5 MG PO TABS
5.0000 mg | ORAL_TABLET | Freq: Every day | ORAL | Status: DC
  Administered 2016-06-11 – 2016-06-16 (×6): 5 mg via ORAL
  Filled 2016-06-11 (×6): qty 1

## 2016-06-11 NOTE — Progress Notes (Signed)
Patient dose of sliding scale insulin dose (Novolog 3 units) administered per [facility] scheduled time with nursing instructor. After med scanned and accepted on [facility] documentation completed, instructor verified. MAR then showed prior administration of the dose by RN Andee Poles. NA Margarette will recheck blood sugar at 1830. Patient is currently eating evening meal.

## 2016-06-11 NOTE — Discharge Instructions (Signed)
[facility]      [facility] [phone]  OPEN ABDOMINAL SURGERY: POST OP INSTRUCTIONS  Always review your discharge instruction sheet given to you by the facility where your surgery was performed.  IF YOU HAVE DISABILITY OR FAMILY LEAVE FORMS, YOU MUST BRING THEM TO THE OFFICE FOR PROCESSING.  PLEASE DO NOT GIVE THEM TO YOUR DOCTOR.  1. A prescription for pain medication may be given to you upon discharge.  Take your pain medication as prescribed, if needed.  If narcotic pain medicine is not needed, then you may take acetaminophen (Tylenol) or ibuprofen (Advil) as needed. 2. Take your usually prescribed medications unless otherwise directed. 3. If you need a refill on your pain medication, please contact your pharmacy. They will contact our office to request authorization.  Prescriptions will not be filled after 5pm or on week-ends. 4. You should follow a light diet the first few days after arrival home, such as soup and crackers, pudding, etc.unless your doctor has advised otherwise. A high-fiber, low fat diet can be resumed as tolerated.   Be sure to include lots of fluids daily. Most patients will experience some swelling and bruising on the chest and neck area.  Ice packs will help.  Swelling and bruising can take several days to resolve 5. Most patients will experience some swelling and bruising in the area of the incision. Ice pack will help. Swelling and bruising can take several days to resolve..  6. It is common to experience some constipation if taking pain medication after surgery.  Increasing fluid intake and taking a stool softener will usually help or prevent this problem from occurring.  A mild laxative (Milk of Magnesia or Miralax) should be taken according to package directions if there are no bowel movements after 48 hours. 7.  You may have steri-strips (small skin tapes) in place directly over the incision.  These strips should be left on the skin for 7-10 days.  If your  surgeon used skin glue on the incision, you may shower in 24 hours.  The glue will flake off over the next 2-3 weeks.  Any sutures or staples will be removed at the office during your follow-up visit. You may find that a light gauze bandage over your incision may keep your staples from being rubbed or pulled. You may shower and replace the bandage daily. 8. ACTIVITIES:  You may resume regular (light) daily activities beginning the next day--such as daily self-care, walking, climbing stairs--gradually increasing activities as tolerated.  You may have sexual intercourse when it is comfortable.  Refrain from any heavy lifting or straining until approved by your doctor. a. You may drive when you no longer are taking prescription pain medication, you can comfortably wear a seatbelt, and you can safely maneuver your car and apply brakes b. Return to Work: ___________________________________ 9. You should see your doctor in the office for a follow-up appointment approximately two weeks after your surgery.  Make sure that you call for this appointment within a day or two after you arrive home to insure a convenient appointment time. OTHER INSTRUCTIONS:  _____________________________________________________________ _____________________________________________________________  WHEN TO CALL YOUR DOCTOR: 1. Fever over 101.0 2. Inability to urinate 3. Nausea and/or vomiting 4. Extreme swelling or bruising 5. Continued bleeding from incision. 6. Increased pain, redness, or drainage from the incision. 7. Difficulty swallowing or breathing 8. Muscle cramping or spasms. 9. Numbness or tingling in hands or feet or around lips.  The clinic staff is available to   answer your questions during regular business hours.  Please don't hesitate to call and ask to speak to one of the nurses if you have concerns.  For further questions, please visit [email or web address]   

## 2016-06-11 NOTE — Progress Notes (Signed)
PROGRESS NOTE [facility]   [facility]ass[facility] Staff   JJH:[id number] DOB: Aug 01, 1932  DOA: 06/03/2016 PCP: No PCP Per Patient   Brief Narrative:   83 ? with medical history significant of T2DM, Barrett's and diverticulosis  Admit 3/36 Recently admitted for GI bleed with Coil embolization 3/20. Patient was recently discharged after being treated for GI bleed with coil embolization of the distal branch of SMA on 05/28/2016. In the emergency room patient had a large bloody bowel movement and he was found to have hemoglobin of 5.1. Admitted for GI bleed and surgical evaluation.    Subjective:  I episode vomit but large stool as well Not nauseous now Walker 81 feet yesterday with therapy but seemed a little confused at that time Orients to me    Assessment & Plan:  Lower GI due to ceacal and asc colonic diverticulosis - with re-bleeding from previous coil, placed on 05/28/16 Anemia of acute blood loss  s/p Open R Hemicolectomy 3/29 s/p 7 units of PRBC's since admit + s/p 1 unit FFP  Hemoglobin peri-op up to 9.9--labs am and d/c q 4 hemoglobin GI, IR and surgery consulted appreciate recommendations  Pain management as per surgery-PCA changed 4/1-->on tylenol  Emphasized IS regularily Slow diet 4/1 given vomit 06/09/16, place on IVF d5/0.45 125 cc/h--now back on clear diet Surgery planning TPN if toleracne of appetite not progressing--obtain AXR in am if further vomit--Have ordered  Acute on CKD III - likely due to hypoperfusion Baseline Cr 1.09  Elevated BP - w/o diagnosis of HTN - likely due to high volume transfusion   IVF rate to 75 cc/hr-->50 cc/hr.  Saline locked 4/3 to see if this can prompt more thirst and diet Started hydralazine 10 mg q 4 prn for BP>160 sys Adding clonidine patch 0.2 mg Blood pressure trending 140's so added amlodipine 5mg   06/11/16  DM type 2 Continue to hold home medication  Monitor CGB's -- 118-193 SSI q4  For now  Hypothyroidism  Continue  Levothyroxine 125 mcg daily  Leukocytosis and acute hypoxic resp failure Persisting but now declining, CXR neg for PNA Has good IS technique and encouraged desat screen to be repeated  BPH Resume Flomax 0.4 mg    DVT prophylaxis: SCD's  Code Status: FULL  Family Communication: d/w wife 4/2 Disposition Plan: up with therapy-probably will need snf   Consultants:   GI  Surgery   IR  Procedures:   None    Objective: Vitals:   06/10/16 0345 06/10/16 2052 06/11/16 0440 06/11/16 1452  BP: (!) 173/80 134/81 (!) 148/81 130/78  Pulse: 78 84 86 100  Resp: 20 19 19    Temp: 98.6 F (37 C) 99.1 F (37.3 C) 98.9 F (37.2 C) 98.4 F (36.9 C)  TempSrc: Oral Oral Oral Oral  SpO2: 98% 94% 98% 98%  Weight:      Height:        Intake/Output Summary (Last 24 hours) at 06/11/16 1515 Last data filed at 06/11/16 0800  Gross per 24 hour  Intake              130 ml  Output              650 ml  Net             -520 ml   Filed Weights   06/03/16 1243 06/03/16 1630  Weight: 79.4 kg (175 lb) 77.1 kg (169 lb 15.6 oz)    Examination:  General exam: sleepy HEENT: Pale  conjunctiva, OP moist  abd soft-slight tympany + BS cta b, no wheeze nor rales or rhonchi     Data Reviewed: I have personally reviewed following labs and imaging studies  CBC:  Recent Labs Lab 06/05/16 0326  06/07/16 0405 06/08/16 0444 06/09/16 0450 06/09/16 1150 06/10/16 0500 06/11/16 0510  WBC 12.4*  --  17.1* 16.7* 16.7* 16.2* 14.1* 11.4*  NEUTROABS 8.7*  --  14.0* 12.9* 13.7*  --   --   --   HGB 7.4*  < > 9.9* 9.1* 9.8* 9.4* 9.3* 8.6*  HCT 21.1*  < > 28.9* 27.0* 29.7* 28.2* 28.1* 25.8*  MCV 85.8  --  85.5 87.4 88.4 87.9 87.8 86.9  PLT 124*  --  143* 150 195 200 232 241  < > = values in this interval not displayed. Basic Metabolic Panel:  Recent Labs Lab 06/04/16 1619 06/05/16 0326 06/07/16 0405 06/09/16 0450 06/10/16 0500  NA 140  141 138 139 136 135  K 3.3*  3.3* 3.4* 4.1 4.3 4.0    CL 114*  114* 113* 113* 105 105  CO2 22  23 21* 21* 22 26  GLUCOSE 102*  99 110* 136* 158* 193*  BUN 26*  25* 29* 19 14 15   CREATININE 1.01  1.04 1.17 1.09 1.15 1.09  CALCIUM 7.2*  7.3* 6.9* 7.8* 8.4* 8.4*  PHOS  --   --  3.0  --   --    GFR: Estimated Creatinine Clearance: 51.9 mL/min (by C-G formula based on SCr of 1.09 mg/dL). Liver Function Tests:  Recent Labs Lab 06/04/16 1619 06/07/16 0405 06/10/16 0500  AST 19  --  48*  ALT 13*  --  26  ALKPHOS 43  --  51  BILITOT 0.9  --  0.5  PROT 4.9*  --  5.1*  ALBUMIN 2.3* 2.1* 1.8*   Coagulation Profile:  Recent Labs Lab 06/06/16 0026  INR 1.12   Cardiac Enzymes: No results for input(s): CKTOTAL, CKMB, CKMBINDEX, TROPONINI in the last 168 hours. CBG:  Recent Labs Lab 06/10/16 2051 06/11/16 0013 06/11/16 0439 06/11/16 0739 06/11/16 1159  GLUCAP 177* 147* 124* 118* 193*   No results for input(s): PROCALCITON, LATICACIDVEN in the last 168 hours.  No results found for this or any previous visit (from the past 240 hour(s)).   Radiology Studies: Dg Chest 2 View  Result Date: 06/10/2016 CLINICAL DATA:  83 year old diabetic male. Follow-up pneumonia. Subsequent encounter. EXAM: CHEST  2 VIEW COMPARISON:  06/03/2016 and 05/28/2016 chest x-ray. No earlier exams available. FINDINGS: Right PICC line tip mid to distal superior vena cava level. Cardiomegaly. Mild pulmonary edema with small pleural effusions. No segmental consolidation. Limited for excluding basilar infiltrate given the small pleural effusion and minimal basilar atelectasis. Calcified tortuous aorta. Gas-filled colon below left hemidiaphragm. No acute osseous abnormality. IMPRESSION: Cardiomegaly. Mild pulmonary edema. Calcified tortuous aorta. Please see above. Electronically Signed   By: Genia Del M.D.   On: 06/10/2016 10:28    Scheduled Meds: . amLODipine  5 mg Oral Daily  . Chlorhexidine Gluconate Cloth  6 each Topical Daily  . cloNIDine  0.2 mg  Transdermal Weekly  . famotidine (PEPCID) IV  20 mg Intravenous Q12H  . insulin aspart  0-15 Units Subcutaneous Q4H  . levothyroxine  125 mcg Oral QAC breakfast  . mouth rinse  15 mL Mouth Rinse BID  . polyethylene glycol  34 g Oral Once  . sodium chloride flush  10-40 mL Intracatheter Q12H  . tamsulosin  0.4 mg Oral Daily   Continuous Infusions:    LOS: 8 days    Verneita Griffes, MD [facility]   If 7PM-7AM, please contact night-coverage [email or web address] Password [email or web address] 06/11/2016, 3:15 PM

## 2016-06-11 NOTE — Progress Notes (Signed)
[facility] Progress Note  5 Days Post-Op  Subjective: CC reflux/heartburn. Denies abdominal pain. Reports belching but no flatus or BM. Has not been out of bed yet today. Tolerating clears without nausea/vomiting but is having poor oral intake. Denies fever/chills.  Objective: Vital signs in last 24 hours: Temp:  [98.9 F (37.2 C)-99.1 F (37.3 C)] 98.9 F (37.2 C) (04/03 0440) Pulse Rate:  [84-86] 86 (04/03 0440) Resp:  [19] 19 (04/03 0440) BP: (134-148)/(81) 148/81 (04/03 0440) SpO2:  [94 %-98 %] 98 % (04/03 0440) Last BM Date: 06/07/16  Intake/Output from previous day: 04/02 0701 - 04/03 0700 In: 270 [P.O.:240; I.V.:30] Out: 650 [Urine:650] Intake/Output this shift: No intake/output data recorded.  PE: Gen:  Alert, NAD, pleasant Pulm:  Non-labored, clear to auscultation bilaterally Abd: Soft, non-tender, moderate distention, bowel sounds present in all 4 quadrants, midline incision c/d/i, dressing in place, pain pump present.  Lab Results:   Recent Labs  06/10/16 0500 06/11/16 0510  WBC 14.1* 11.4*  HGB 9.3* 8.6*  HCT 28.1* 25.8*  PLT 232 241   BMET  Recent Labs  06/09/16 0450 06/10/16 0500  NA 136 135  K 4.3 4.0  CL 105 105  CO2 22 26  GLUCOSE 158* 193*  BUN 14 15  CREATININE 1.15 1.09  CALCIUM 8.4* 8.4*   PT/INR No results for input(s): LABPROT, INR in the last 72 hours. CMP     Component Value Date/Time   NA 135 06/10/2016 0500   K 4.0 06/10/2016 0500   CL 105 06/10/2016 0500   CO2 26 06/10/2016 0500   GLUCOSE 193 (H) 06/10/2016 0500   BUN 15 06/10/2016 0500   CREATININE 1.09 06/10/2016 0500   CREATININE 1.35 12/02/2013 1106   CALCIUM 8.4 (L) 06/10/2016 0500   PROT 5.1 (L) 06/10/2016 0500   ALBUMIN 1.8 (L) 06/10/2016 0500   AST 48 (H) 06/10/2016 0500   ALT 26 06/10/2016 0500   ALKPHOS 51 06/10/2016 0500   BILITOT 0.5 06/10/2016 0500   GFRNONAA >60 06/10/2016 0500   GFRNONAA 50 (L) 12/02/2013 1106   GFRAA >60 06/10/2016  0500   GFRAA 57 (L) 12/02/2013 1106   Lipase  No results found for: LIPASE     Studies/Results: Dg Chest 2 View  Result Date: 06/10/2016 CLINICAL DATA:  83 year old diabetic male. Follow-up pneumonia. Subsequent encounter. EXAM: CHEST  2 VIEW COMPARISON:  06/03/2016 and 05/28/2016 chest x-ray. No earlier exams available. FINDINGS: Right PICC line tip mid to distal superior vena cava level. Cardiomegaly. Mild pulmonary edema with small pleural effusions. No segmental consolidation. Limited for excluding basilar infiltrate given the small pleural effusion and minimal basilar atelectasis. Calcified tortuous aorta. Gas-filled colon below left hemidiaphragm. No acute osseous abnormality. IMPRESSION: Cardiomegaly. Mild pulmonary edema. Calcified tortuous aorta. Please see above. Electronically Signed   By: Genia Del M.D.   On: 06/10/2016 10:28    Anti-infectives: Anti-infectives    Start     Dose/Rate Route Frequency Ordered Stop   06/06/16 0800  clindamycin (CLEOCIN) 900 mg, gentamicin (GARAMYCIN) 240 mg in sodium chloride 0.9 % 1,000 mL for intraperitoneal lavage  Status:  Discontinued      Intraperitoneal To Surgery 06/05/16 2053 06/06/16 1029   06/06/16 0643  cefoTEtan in Dextrose 5% (CEFOTAN) 2-2.08 GM-% IVPB    Comments:  Key, [name]   : cabinet override      06/06/16 0643 06/06/16 1844   06/06/16 0600  cefoTEtan (CEFOTAN) 2 g in dextrose 5 % 50 mL IVPB  2 g 100 mL/hr over 30 Minutes Intravenous On call to O.R. 06/05/16 2053 06/06/16 0754   06/06/16 0200  neomycin (MYCIFRADIN) tablet 1,000 mg     1,000 mg Oral  Once 06/05/16 2107 06/06/16 0152   06/06/16 0200  metroNIDAZOLE (FLAGYL) tablet 1,000 mg     1,000 mg Oral NOW 06/05/16 2107 06/06/16 0152   06/05/16 2300  neomycin (MYCIFRADIN) tablet 1,000 mg  Status:  Discontinued     1,000 mg Oral  Once 06/05/16 2106 06/05/16 2108   06/05/16 2300  metroNIDAZOLE (FLAGYL) tablet 1,000 mg     1,000 mg Oral NOW 06/05/16 2108 06/05/16  2330   06/05/16 2300  neomycin (MYCIFRADIN) tablet 1,000 mg     1,000 mg Oral  Once 06/05/16 2108 06/05/16 2330   06/05/16 2115  neomycin (MYCIFRADIN) tablet 1,000 mg     1,000 mg Oral NOW 06/05/16 2053 06/05/16 2150   06/05/16 2115  metroNIDAZOLE (FLAGYL) tablet 1,000 mg     1,000 mg Oral NOW 06/05/16 2053 06/05/16 2150   06/05/16 2115  metroNIDAZOLE (FLAGYL) tablet 1,000 mg  Status:  Discontinued     1,000 mg Oral NOW 06/05/16 2106 06/05/16 2108     Assessment/Plan Lower GIB 2/2 diverticulosis of ascending colon/cecum POD#5 S/P Open right hemicolectomy 06/06/16, Dr. Stark Klein  - post-op ileus, nausea/vomiting resolved - clear liquid diet  - await return of bowel function - mobilize! And sit up to chair. - pt denied dulcolax   ABL anemia - s/p transfusion; slight drop in hgb today 8.6 from 9.3, monitor  Acute hypoxic respiratory failure  CKD III DM2 Hypothyroidism  BPH  FEN: clear liquid diet,  ID: cefotetan perioperatively VTE: SCD's   Pain: Tylenol PRN   Plan: await return of bowel function, start pepcid BID, mobilize multiple times daily - OOB to chair and ambulate with walker in hallway miralax PRN   Consider starting TPN in 24-48 hours if patient continues to have prolonged post-op ileus   LOS: 8 days    [name] , [facility] 06/11/2016, 9:59 AM Pager: [phone] Consults: [phone] Mon-Fri 7:00 am-4:30 pm Sat-Sun 7:00 am-11:30 am

## 2016-06-12 ENCOUNTER — Inpatient Hospital Stay (HOSPITAL_COMMUNITY): Payer: Medicare Other

## 2016-06-12 DIAGNOSIS — K5731 Diverticulosis of large intestine without perforation or abscess with bleeding: Principal | ICD-10-CM

## 2016-06-12 DIAGNOSIS — K567 Ileus, unspecified: Secondary | ICD-10-CM

## 2016-06-12 DIAGNOSIS — K9189 Other postprocedural complications and disorders of digestive system: Secondary | ICD-10-CM

## 2016-06-12 HISTORY — DX: Other postprocedural complications and disorders of digestive system: K91.89

## 2016-06-12 HISTORY — DX: Other postprocedural complications and disorders of digestive system: K56.7

## 2016-06-12 LAB — GLUCOSE, CAPILLARY
GLUCOSE-CAPILLARY: 124 mg/dL — AB (ref 65–99)
Glucose-Capillary: 124 mg/dL — ABNORMAL HIGH (ref 65–99)
Glucose-Capillary: 138 mg/dL — ABNORMAL HIGH (ref 65–99)
Glucose-Capillary: 146 mg/dL — ABNORMAL HIGH (ref 65–99)
Glucose-Capillary: 154 mg/dL — ABNORMAL HIGH (ref 65–99)
Glucose-Capillary: 173 mg/dL — ABNORMAL HIGH (ref 65–99)

## 2016-06-12 MED ORDER — OXYCODONE HCL 5 MG PO TABS: 5.0000 mg | ORAL_TABLET | ORAL | Status: DC | PRN

## 2016-06-12 MED ORDER — HEPARIN SODIUM (PORCINE) 5000 UNIT/ML IJ SOLN
5000.0000 [IU] | Freq: Three times a day (TID) | INTRAMUSCULAR | Status: DC
  Administered 2016-06-12 – 2016-06-16 (×12): 5000 [IU] via SUBCUTANEOUS
  Filled 2016-06-12 (×12): qty 1

## 2016-06-12 MED ORDER — FAMOTIDINE 20 MG PO TABS
20.0000 mg | ORAL_TABLET | Freq: Two times a day (BID) | ORAL | Status: DC
  Administered 2016-06-12 – 2016-06-16 (×9): 20 mg via ORAL
  Filled 2016-06-12 (×9): qty 1

## 2016-06-12 MED ORDER — UNJURY CHICKEN SOUP POWDER
2.0000 [oz_av] | Freq: Two times a day (BID) | ORAL | Status: DC
  Administered 2016-06-12 – 2016-06-13 (×2): 2 [oz_av] via ORAL
  Filled 2016-06-12 (×3): qty 27

## 2016-06-12 MED ORDER — ACETAMINOPHEN 500 MG PO TABS
1000.0000 mg | ORAL_TABLET | Freq: Four times a day (QID) | ORAL | Status: DC
  Administered 2016-06-12 – 2016-06-16 (×15): 1000 mg via ORAL
  Filled 2016-06-12 (×16): qty 2

## 2016-06-12 NOTE — Care Management Important Message (Signed)
Important Message  Patient Details  Name: Zachary Cook MRN: [id number] Date of Birth: 06/13/1932   Medicare Important Message Given:  Yes    Kerin Salen 06/12/2016, 10:34 AMImportant Message  Patient Details  Name: Zachary Cook MRN: [id number] Date of Birth: [birth year]   Medicare Important Message Given:  Yes    Kerin Salen 06/12/2016, 10:34 AM

## 2016-06-12 NOTE — Progress Notes (Addendum)
PROGRESS NOTE  Zachary Cook JJO:[id number] DOB: 1932-08-21 DOA: 06/03/2016 PCP: No PCP Per Patient   LOS: 9 days   Brief Narrative: 83 y/o male with a history of diabetes mellitus type II, Barrett's, and diverticulosis with surgical history of coli embolization of distal branch of SMA for recurrent GI bleed on 05/28/16 after being admitted from ED for bloody bowel movement and Hgb 5.1. Patient underwent open ascending right hemicolectomy on 06/06/16. Denies any bowel movements or flatus since the procedure.   Assessment & Plan: Active Problems:   Diverticulosis of colon with hemorrhage   GI bleed   Ileus following gastrointestinal surgery   Acute blood loss anemia   AKI (acute kidney injury) ([place])   Type 2 diabetes mellitus (HCC)   Hypertension   GERD (gastroesophageal reflux disease)   Adult onset hypothyroidism  s/p Open Right Hemicolectomy 06/06/16 due to colonic diverticulosis with rebleeding since previous coli placed on 05/28/16 Anemia of acute blood loss Post-op ileus on POD5 Patient still unable to have a bowel movement post-op day 5. He has ambulated with PT assistance on 4/2 and 4/1. Abdominal X-ray today shows air fluid level suspicious for ileus or partial small bowel obstruction with moderate gas within transverse colon. Surgery was consulted and will continue to monitor. Consider TPN.  Hgb dropped from 9.3 on 4/2 to 8.6 on 4/3. No drainage or bleeding from incision site. Mild leukocytosis at 11.4 on 4/3 with no current signs of infection.   Acute kidney injury Creatinine stable at 1.09, BUN 15.   Hypertension Stable at 147/80. Continue Norvasc, Catapres, Hydralazine.   Type 2 diabetes mellitus Continue SSI, monitor CBGs 124-138 today. Hold home medications.  Hypothyroidism Continue Synthroid 125 mcg  GERD Continue Pepcid.   DVT prophylaxis: SCD  Code Status: Full Family Communication: Discussed plan with wife at bedside Disposition Plan: continue PT,  probable SNF upon discharge. Monitor by surgery  Consultants:   Surgery  Procedures:   Abdominal x-ray  Antimicrobials:  None     Subjective: Patient unable to have a bowel movement or pass flatulence post-op day 5. Also has decreased appetite. No episodes of nausea or vomiting today. Denies fever/chills, dyspnea, chest pain, leg swelling or pain. Able to ambulate with PT assistance. Continues to have generalized weakness and needs assistance to use the restroom. Mental status is grossly intact and no signs of confusion.   Objective: Vitals:   06/11/16 1452 06/11/16 1541 06/11/16 1947 06/12/16 0419  BP: 130/78 130/78 102/74 (!) 147/80  Pulse: 100  98 82  Resp: 18  20 20   Temp: 98.4 F (36.9 C)  98.8 F (37.1 C) 98.5 F (36.9 C)  TempSrc: Oral  Oral Oral  SpO2: 98%  99% 98%  Weight:      Height:        Intake/Output Summary (Last 24 hours) at 06/12/16 1133 Last data filed at 06/12/16 0921  Gross per 24 hour  Intake              480 ml  Output              475 ml  Net                5 ml   Filed Weights   06/03/16 1243 06/03/16 1630  Weight: 79.4 kg (175 lb) 77.1 kg (169 lb 15.6 oz)    Examination: Constitutional: NAD Vitals:   06/11/16 1452 06/11/16 1541 06/11/16 1947 06/12/16 0419  BP: 130/78 130/78 102/74 Marland Kitchen)  147/80  Pulse: 100  98 82  Resp: 18  20 20   Temp: 98.4 F (36.9 C)  98.8 F (37.1 C) 98.5 F (36.9 C)  TempSrc: Oral  Oral Oral  SpO2: 98%  99% 98%  Weight:      Height:       Eyes: PERRL, lids and conjunctivae normal ENMT: Mucous membranes are moist. No oropharyngeal exudates Neck: normal, supple, no masses, no thyromegaly Respiratory: clear to auscultation bilaterally, no wheezing, no crackles. Normal respiratory effort. No accessory muscle use.  Cardiovascular: Regular rate and rhythm, no murmurs / rubs / gallops. No LE edema. 2+ pedal pulses. No carotid bruits.  Abdomen: no tenderness. Bowel sounds positive. No drainage, swelling, erythema,  warmth around surgical incision site. Not exquisitely TTP.  Musculoskeletal: no clubbing / cyanosis. No joint deformity upper and lower extremities. No contractures. Normal muscle tone.  Skin: no rashes, lesions, ulcers. No induration Neurologic: CN 2-12 grossly intact. Strength 5/5 in all 4.  Psychiatric: Normal judgment and insight. Alert and oriented x 3. Normal mood.    Data Reviewed: I have personally reviewed following labs and imaging studies  CBC:  Recent Labs Lab 06/07/16 0405 06/08/16 0444 06/09/16 0450 06/09/16 1150 06/10/16 0500 06/11/16 0510  WBC 17.1* 16.7* 16.7* 16.2* 14.1* 11.4*  NEUTROABS 14.0* 12.9* 13.7*  --   --   --   HGB 9.9* 9.1* 9.8* 9.4* 9.3* 8.6*  HCT 28.9* 27.0* 29.7* 28.2* 28.1* 25.8*  MCV 85.5 87.4 88.4 87.9 87.8 86.9  PLT 143* 150 195 200 232 626   Basic Metabolic Panel:  Recent Labs Lab 06/07/16 0405 06/09/16 0450 06/10/16 0500  NA 139 136 135  K 4.1 4.3 4.0  CL 113* 105 105  CO2 21* 22 26  GLUCOSE 136* 158* 193*  BUN 19 14 15   CREATININE 1.09 1.15 1.09  CALCIUM 7.8* 8.4* 8.4*  PHOS 3.0  --   --    GFR: Estimated Creatinine Clearance: 51.9 mL/min (by C-G formula based on SCr of 1.09 mg/dL). Liver Function Tests:  Recent Labs Lab 06/07/16 0405 06/10/16 0500  AST  --  48*  ALT  --  26  ALKPHOS  --  51  BILITOT  --  0.5  PROT  --  5.1*  ALBUMIN 2.1* 1.8*   Coagulation Profile:  Recent Labs Lab 06/06/16 0026  INR 1.12   CBG:  Recent Labs Lab 06/11/16 1829 06/11/16 1952 06/12/16 0028 06/12/16 0418 06/12/16 0839  GLUCAP 216* 186* 138* 146* 124*   Urine analysis:    Component Value Date/Time   COLORURINE YELLOW 03/23/2014 1016   APPEARANCEUR CLEAR 03/23/2014 1016   LABSPEC 1.015 03/23/2014 1016   PHURINE 5.5 03/23/2014 1016   GLUCOSEU >=1000 (A) 03/23/2014 1016   HGBUR NEGATIVE 03/23/2014 1016   BILIRUBINUR NEGATIVE 03/23/2014 1016   KETONESUR NEGATIVE 03/23/2014 1016   UROBILINOGEN 0.2 03/23/2014 1016    NITRITE NEGATIVE 03/23/2014 1016   LEUKOCYTESUR NEGATIVE 03/23/2014 1016    Radiology Studies: Dg Abd 2 Views  Result Date: 06/12/2016 CLINICAL DATA:  Postop ileus EXAM: ABDOMEN - 2 VIEW COMPARISON:  05/28/2016 FINDINGS: There are gaseous distended small bowel loops mid abdomen with some air-fluid level suspicious for ileus or partial bowel obstruction. Moderate gas noted within transverse colon. Midline lower abdomen skin staples. IMPRESSION: Gaseous distended small bowel loops mid abdomen with air-fluid level suspicious for ileus or partial small bowel obstruction. Moderate gas noted within transverse colon. Electronically Signed   By: Orlean Bradford.D.  On: 06/12/2016 08:34     Scheduled Meds: . amLODipine  5 mg Oral Daily  . Chlorhexidine Gluconate Cloth  6 each Topical Daily  . cloNIDine  0.2 mg Transdermal Weekly  . famotidine (PEPCID) IV  20 mg Intravenous Q12H  . insulin aspart  0-15 Units Subcutaneous Q4H  . levothyroxine  125 mcg Oral QAC breakfast  . mouth rinse  15 mL Mouth Rinse BID  . polyethylene glycol  34 g Oral Once  . sodium chloride flush  10-40 mL Intracatheter Q12H  . tamsulosin  0.4 mg Oral Daily   Continuous Infusions:   Time spent: 30 minutes  Donald Siva, PA-Student  Attending MD note  Patient was seen, examined,treatment plan was discussed with the PA-S [facility].  I have personally reviewed the clinical findings, lab, imaging studies and management of this patient in detail. I agree with the documentation, as recorded by the PA-S.   Patient is 83 year old male with history of diabetes, Barrett's esophagus, diverticulosis with a recent hospitalization for a GI bleed felt to be diverticular in origin, with a positive nuclear tagged scan, and IR was consulted at that time and he is status post successful coil embolization of the distal branch of the SMA.  He was discharged home in stable condition however was readmitted with recurrent GI bleed from same  area.  General surgery was consulted and he was taken to the operating room on 3/29 for right colectomy.  On Exam: Gen. exam: Awake, alert, not in any distress Chest: Good air entry bilaterally, no rhonchi or rales CVS: S1-S2 regular, no murmurs Abdomen: Soft, nontender and nondistended, surgical site with clean dressing, no bleeding Neurology: Non-focal Skin: No rash or lesions  Plan  Diverticular bleed status post open right hemicolectomy on 3/29 -Patient's course complicated by postop ileus, management per general surgery -Abdominal x-ray today continues to have findings of ileus/SBO -To be considered for TPN per general surgery  Acute blood loss anemia -Hemoglobin somewhat down trending, most recent on 8.6, no overt bleeding, continue to monitor  Acute kidney injury -Creatinine on admission was elevated to 1.5, improved with fluids and now within normal limits  Hypertension Stable at 147/80. Continue Norvasc, Catapres, Hydralazine.   Type 2 diabetes mellitus Continue SSI, monitor CBGs 124-138 today. Hold home medications.  Hypothyroidism Continue Synthroid 125 mcg  GERD Continue Pepcid.    Rest as above  Marzetta Board, MD  If 7PM-7AM, please contact night-coverage [email or web address] Password [facility] 06/12/2016, 11:33 AM

## 2016-06-12 NOTE — Progress Notes (Signed)
6 Days Post-Op  Subjective: CC: Recurrent Lower GI bleed In bed not moving much. Up walking x 1 yesterday.  Did not remember how to use IS.  On-Q still in and empty, I removed it.  Still onO2.  Saturations are 92-98% on room air depending on how well he breaths in.    Objective: Vital signs in last 24 hours: Temp:  [98.4 F (36.9 C)-98.8 F (37.1 C)] 98.5 F (36.9 C) (04/04 0419) Pulse Rate:  [82-100] 82 (04/04 0419) Resp:  [18-20] 20 (04/04 0419) BP: (102-147)/(74-80) 147/80 (04/04 0419) SpO2:  [98 %-99 %] 98 % (04/04 0419) Last BM Date: 06/11/16 (As stated by patient) PO 360 Urine 300 recorded Emesis x 1 recorded BM x 1 recorded Afebrile, VSS 2 view:   Gaseous distended small bowel loops mid abdomen with air-fluid level suspicious for ileus or partial small bowel obstruction. Moderate gas noted within transverse colon.   Intake/Output from previous day: 04/03 0701 - 04/04 0700 In: 91 [P.O.:360; I.V.:10; IV Piggyback:100] Out: 300 [Urine:300] Intake/Output this shift: Total I/O In: 10 [I.V.:10] Out: 175 [Urine:175]  General appearance: alert, cooperative, no distress and still pale, looks tired and I wonder if he is having some memory issues.   Resp: clear to auscultation bilaterally GI: soft, ? a little distended.  Incision looks fine.  Few BS.   Lab Results:   Recent Labs  06/10/16 0500 06/11/16 0510  WBC 14.1* 11.4*  HGB 9.3* 8.6*  HCT 28.1* 25.8*  PLT 232 241    BMET  Recent Labs  06/10/16 0500  NA 135  K 4.0  CL 105  CO2 26  GLUCOSE 193*  BUN 15  CREATININE 1.09  CALCIUM 8.4*   PT/INR No results for input(s): LABPROT, INR in the last 72 hours.   Recent Labs Lab 06/07/16 0405 06/10/16 0500  AST  --  48*  ALT  --  26  ALKPHOS  --  51  BILITOT  --  0.5  PROT  --  5.1*  ALBUMIN 2.1* 1.8*     Lipase  No results found for: LIPASE   Studies/Results: Dg Abd 2 Views  Result Date: 06/12/2016 CLINICAL DATA:  Postop ileus EXAM: ABDOMEN  - 2 VIEW COMPARISON:  05/28/2016 FINDINGS: There are gaseous distended small bowel loops mid abdomen with some air-fluid level suspicious for ileus or partial bowel obstruction. Moderate gas noted within transverse colon. Midline lower abdomen skin staples. IMPRESSION: Gaseous distended small bowel loops mid abdomen with air-fluid level suspicious for ileus or partial small bowel obstruction. Moderate gas noted within transverse colon. Electronically Signed   By: Lahoma Crocker M.D.   On: 06/12/2016 08:34    Medications: . amLODipine  5 mg Oral Daily  . Chlorhexidine Gluconate Cloth  6 each Topical Daily  . cloNIDine  0.2 mg Transdermal Weekly  . famotidine (PEPCID) IV  20 mg Intravenous Q12H  . insulin aspart  0-15 Units Subcutaneous Q4H  . levothyroxine  125 mcg Oral QAC breakfast  . mouth rinse  15 mL Mouth Rinse BID  . polyethylene glycol  34 g Oral Once  . sodium chloride flush  10-40 mL Intracatheter Q12H  . tamsulosin  0.4 mg Oral Daily   No IV fluids  Assessment/Plan Recurrent acute lower GI bleed. Status post appear mesenteric arteriogram and superselective coil embolization of active bleeding, 05/28/16 Dr. Vernard Gambles IR. Transfused 5 units of PRBC 06/04/16 Open right hemicolectomy, 06/06/16, Dr. Stark Klein  POD 6 Acute on chronic kidney disease.-  Creatinine 1.51 =>>1.09 Type 2 diabetes mellitus  - glucoses are up GERD Anemia - H/H: 5.1/16 FEN:  IV fluids/clears. ID: no Abx DVT:  SCD's  =>> add heparin - recheck labs in AM.  Plan:  Leave him on clears for now he still has allot of large and small bowel distension post op.  Add some IV fluids till his PO intake is better.  I have ask nurse to have someone help him with IS, he has no memory of it and could only get it right about 1/2 the times he tried while I was in the room.   Wean off O2.  Mobilize!!!! Po pain medication, change Pepcid to PO Prealbumin in AM.      LOS: 9 days    Bianco Cange 06/12/2016 [phone]

## 2016-06-12 NOTE — Progress Notes (Signed)
Initial Nutrition Assessment  DOCUMENTATION CODES:   Non-severe (moderate) malnutrition in context of chronic illness  INTERVENTION:   Unjury chicken Soup BID, Each serving provides 100kcal and 21g protein   NUTRITION DIAGNOSIS:   Malnutrition (moderate) related to chronic illness, Barrett's esophagus, diverticulitis, chronic GI bleed, as evidenced by 4 percent weight loss in one week, moderate depletions of muscle mass, and moderate depletion of fat in upper arms  GOAL:   Patient will meet greater than or equal to 90% of their needs  MONITOR:   PO intake, Supplement acceptance, Labs, Weight trends  REASON FOR ASSESSMENT:   LOS    ASSESSMENT:   83 yo male admitted with GI bleed. S/P hemicolectomy 3/29. Hx of DM, Barrett's esophagus, s/p coil embolization 05/2016  Met with pt in room today. RD familiar with pt from previous admissions. Pt reports decreased appetite and oral intake for 3 weeks pta. Pt reports intermittent nausea and vomiting. Per chart, pt has lost 6lbs(4%) in one week; this is severe. Pt with possible post op SBO vs ileus; tolerating clears. Pt would like to try Unjury to help meet protein needs while on liquid diet; RD will order. Per MD note, plan is to possibly start TPN in 1-2 days if pt not able to advance diet.  RD will order Ensure when diet advanced.   Medications reviewed and include: pepcid, insulin, synthroid, miralax  Labs reviewed: Ca 8.4(L) adj. 10.16 wnl, Alb 1.8(L), AST 48(H), P 3.0 wnl- 3/30 WBC- 11.4(H), hgb 8.6(L), Hct 25.8(L) cbgs- 158, 193 x 24 hrs AIC 8.4(H)- 02/2016  Nutrition-Focused physical exam completed. Findings are moderate fat depletion in upper arms, moderate muscle depletion in clavicles, shoulders, hands, and knees, and mild edema.   Diet Order:  Diet clear liquid Room service appropriate? Yes; Fluid consistency: Thin  Skin:  Wound (see comment) (abdominal incision )  Last BM:  4/3  Height:   Ht Readings from Last 1  Encounters:  06/03/16 '5\' 6"'$  (1.676 m)    Weight:   Wt Readings from Last 1 Encounters:  06/03/16 169 lb 15.6 oz (77.1 kg)    Ideal Body Weight:  64.5 kg  BMI:  Body mass index is 27.43 kg/m.  Estimated Nutritional Needs:   Kcal:  1900-2100kcal/day   Protein:  100-115g/day   Fluid:  >1.9L/day   EDUCATION NEEDS:   No education needs identified at this time  Koleen Distance, RD, LDN Pager #[phone]

## 2016-06-12 NOTE — NC FL2 (Signed)
[place] LEVEL OF CARE SCREENING TOOL     IDENTIFICATION  Patient Name: Zachary Cook Birthdate: 08/07/1932 Sex: male Admission Date (Current Location): 06/03/2016  [facility] and [place] Number:  Herbalist and Address:  [facility],  [place] [address], [place]      Provider Number: [id number]  Attending Physician Name and Address:  Caren Griffins, MD  Relative Name and Phone Number:       Current Level of Care: Hospital Recommended Level of Care: [place] Prior Approval Number:    Date Approved/Denied:   PASRR Number:    Discharge Plan: SNF    Current Diagnoses: Patient Active Problem List   Diagnosis Date Noted  . Ileus following gastrointestinal surgery 06/12/2016  . GI bleed 06/03/2016  . Syncope   . Diverticulosis of colon with hemorrhage   . Hematochezia   . Lower gastrointestinal bleeding   . BRBPR (bright red blood per rectum) 05/27/2016  . Acute blood loss anemia 05/27/2016  . Hypotension due to blood loss 05/27/2016  . AKI (acute kidney injury) ([place]) 05/27/2016  . Hyperkalemia 05/27/2016  . Adult onset hypothyroidism 12/20/2013  . Type II diabetes mellitus, uncontrolled ([place]) 12/20/2013  . Hypogonadism in male 12/02/2013  . Type 2 diabetes mellitus ([place]) 05/09/2011  . Hypertension 05/09/2011  . GERD (gastroesophageal reflux disease) 05/09/2011  . Hypogonadism male 05/09/2011    Orientation RESPIRATION BLADDER Height & Weight     Self, Time, Situation, Place  Normal Continent Weight: 169 lb 15.6 oz (77.1 kg) Height:  5\' 6"  (167.6 cm)  BEHAVIORAL SYMPTOMS/MOOD NEUROLOGICAL BOWEL NUTRITION STATUS      Continent Diet (See Discharge Summary)  AMBULATORY STATUS COMMUNICATION OF NEEDS Skin   Limited Assist Verbally Surgical wounds (Abdomen/ Gauze Drainage )                       Personal Care Assistance Level of Assistance  Bathing, Feeding, Dressing Bathing Assistance: Limited  assistance Feeding assistance: Independent Dressing Assistance: Limited assistance     Functional Limitations Info  Sight, Hearing, Speech Sight Info: Adequate Hearing Info: Impaired Speech Info: Adequate    SPECIAL CARE FACTORS FREQUENCY  PT (By licensed PT), OT (By licensed OT)     PT Frequency: 5              Contractures Contractures Info: Not present    Additional Factors Info  Code Status, Allergies Code Status Info: Full Allergies Info: No Known Allergies            Current Medications (06/12/2016):  This is the current hospital active medication list Current Facility-Administered Medications  Medication Dose Route Frequency Provider Last Rate Last Dose  . acetaminophen (TYLENOL) tablet 1,000 mg  1,000 mg Oral Q6H Earnstine Regal, PA-C   1,000 mg at 06/12/16 1334  . amLODipine (NORVASC) tablet 5 mg  5 mg Oral Daily Nita Sells, MD   5 mg at 06/12/16 0921  . Chlorhexidine Gluconate Cloth 2 % PADS 6 each  6 each Topical Daily Doreatha Lew, MD   6 each at 06/11/16 1000  . cloNIDine (CATAPRES - Dosed in mg/24 hr) patch 0.2 mg  0.2 mg Transdermal Weekly Nita Sells, MD   0.2 mg at 06/06/16 1659  . famotidine (PEPCID) tablet 20 mg  20 mg Oral BID Earnstine Regal, PA-C   20 mg at 06/12/16 1334  . heparin injection 5,000 Units  5,000 Units Subcutaneous Q8H  Earnstine Regal, PA-C   5,000 Units at 06/12/16 1334  . hydrALAZINE (APRESOLINE) injection 10 mg  10 mg Intravenous Q4H PRN Nita Sells, MD   10 mg at 06/10/16 0402  . insulin aspart (novoLOG) injection 0-15 Units  0-15 Units Subcutaneous Q4H Doreatha Lew, MD   3 Units at 06/12/16 1334  . levothyroxine (SYNTHROID, LEVOTHROID) tablet 125 mcg  125 mcg Oral QAC breakfast Nita Sells, MD   125 mcg at 06/12/16 0921  . MEDLINE mouth rinse  15 mL Mouth Rinse BID Doreatha Lew, MD   15 mL at 06/12/16 2878  . oxyCODONE (Oxy IR/ROXICODONE) immediate release tablet 5-10 mg  5-10 mg  Oral Q4H PRN Earnstine Regal, PA-C      . polyethylene glycol (MIRALAX / GLYCOLAX) packet 34 g  34 g Oral Once Michael Boston, MD      . promethazine (PHENERGAN) injection 12.5 mg  12.5 mg Intravenous Q6H PRN Nita Sells, MD   12.5 mg at 06/11/16 1100  . protein supplement (UNJURY CHICKEN SOUP) powder 2 oz  2 oz Oral BID Costin Karlyne Greenspan, MD      . sodium chloride flush (NS) 0.9 % injection 10-40 mL  10-40 mL Intracatheter Q12H Nita Sells, MD   10 mL at 06/12/16 0926  . sodium chloride flush (NS) 0.9 % injection 10-40 mL  10-40 mL Intracatheter PRN Nita Sells, MD   10 mL at 06/12/16 0530  . tamsulosin (FLOMAX) capsule 0.4 mg  0.4 mg Oral Daily Nita Sells, MD   0.4 mg at 06/12/16 6767     Discharge Medications: Please see discharge summary for a list of discharge medications.  Relevant Imaging Results:  Relevant Lab Results:   Additional Information ssn: [phone]  Lia Hopping, LCSW

## 2016-06-12 NOTE — Assessment & Plan Note (Deleted)
Continue SSI, monitor CBGs

## 2016-06-12 NOTE — Progress Notes (Signed)
qPhysical Therapy Treatment Patient Details Name: Zachary Cook MRN: [id number] DOB: 05-14-1932 Today's Date: 06/12/2016    History of Present Illness 83 yo male admitted with GI bleed. S/P hemicolectomy 3/29. Hx of DM, Barrett's esophagus, s/p coil embolization 05/2016    PT Comments    Pt ambulated in hallway and fatigued quickly.  Pt also performed a couple LE exercises.  Follow Up Recommendations  Home health PT;Supervision/Assistance - 24 hour (if spouse unable to manage, may need SNF)     Equipment Recommendations  Rolling walker with 5" wheels    Recommendations for Other Services       Precautions / Restrictions Precautions Precautions: Fall    Mobility  Bed Mobility               General bed mobility comments: pt sitting EOB with student nurse on arrival  Transfers Overall transfer level: Needs assistance Equipment used: Rolling walker (2 wheeled) Transfers: Sit to/from Stand Sit to Stand: Min guard         General transfer comment: verbal cues for safe technique  Ambulation/Gait Ambulation/Gait assistance: Min guard Ambulation Distance (Feet): 120 Feet Assistive device: Rolling walker (2 wheeled) Gait Pattern/deviations: Step-through pattern;Decreased stride length     General Gait Details: close guard for safety. O2 sats 95% on RA.   Stairs            Wheelchair Mobility    Modified Rankin (Stroke Patients Only)       Balance                                            Cognition Arousal/Alertness: Awake/alert Behavior During Therapy: WFL for tasks assessed/performed Overall Cognitive Status: No family/caregiver present to determine baseline cognitive functioning Area of Impairment: Memory                     Memory: Decreased short-term memory                Exercises General Exercises - Lower Extremity Long Arc Quad: AROM;10 reps;Both;Seated Hip ABduction/ADduction:  AROM;Supine;Both;10 reps Hip Flexion/Marching: AROM;Seated;10 reps;Both Toe Raises: Seated;10 reps;AROM;Both Heel Raises: Seated;10 reps;AROM;Both    General Comments        Pertinent Vitals/Pain Pain Assessment: No/denies pain    Home Living                      Prior Function            PT Goals (current goals can now be found in the care plan section) Progress towards PT goals: Progressing toward goals    Frequency    Min 3X/week      PT Plan Current plan remains appropriate    Co-evaluation             End of Session   Activity Tolerance: Patient tolerated treatment well Patient left: in chair;with call bell/phone within reach;with chair alarm set   PT Visit Diagnosis: Muscle weakness (generalized) (M62.81);Difficulty in walking, not elsewhere classified (R26.2)     Time: 2637-8588 PT Time Calculation (min) (ACUTE ONLY): 10 min  Charges:  $Gait Training: 8-22 mins                    G Codes:       Zachary Cook, PT, DPT 06/12/2016 Pager: [phone]    Zachary Cook  E 06/12/2016, 4:06 PM

## 2016-06-13 ENCOUNTER — Inpatient Hospital Stay (HOSPITAL_COMMUNITY): Payer: Medicare Other

## 2016-06-13 DIAGNOSIS — E440 Moderate protein-calorie malnutrition: Secondary | ICD-10-CM | POA: Insufficient documentation

## 2016-06-13 LAB — CBC
HCT: 24.4 % — ABNORMAL LOW (ref 39.0–52.0)
Hemoglobin: 8.1 g/dL — ABNORMAL LOW (ref 13.0–17.0)
MCH: 29 pg (ref 26.0–34.0)
MCHC: 33.2 g/dL (ref 30.0–36.0)
MCV: 87.5 fL (ref 78.0–100.0)
Platelets: 259 10*3/uL (ref 150–400)
RBC: 2.79 MIL/uL — ABNORMAL LOW (ref 4.22–5.81)
RDW: 15.5 % (ref 11.5–15.5)
WBC: 9.7 10*3/uL (ref 4.0–10.5)

## 2016-06-13 LAB — GLUCOSE, CAPILLARY
GLUCOSE-CAPILLARY: 119 mg/dL — AB (ref 65–99)
GLUCOSE-CAPILLARY: 150 mg/dL — AB (ref 65–99)
GLUCOSE-CAPILLARY: 138 mg/dL — AB (ref 65–99)
Glucose-Capillary: 102 mg/dL — ABNORMAL HIGH (ref 65–99)
Glucose-Capillary: 129 mg/dL — ABNORMAL HIGH (ref 65–99)
Glucose-Capillary: 143 mg/dL — ABNORMAL HIGH (ref 65–99)

## 2016-06-13 LAB — PREALBUMIN: PREALBUMIN: 6.8 mg/dL — AB (ref 18–38)

## 2016-06-13 LAB — BASIC METABOLIC PANEL
Anion gap: 5 (ref 5–15)
BUN: 12 mg/dL (ref 6–20)
CALCIUM: 8.1 mg/dL — AB (ref 8.9–10.3)
CO2: 28 mmol/L (ref 22–32)
CREATININE: 1.01 mg/dL (ref 0.61–1.24)
Chloride: 103 mmol/L (ref 101–111)
GFR calc non Af Amer: >60 mL/min (ref >60)
Glucose, Bld: 105 mg/dL — ABNORMAL HIGH (ref 65–99)
Potassium: 3.8 mmol/L (ref 3.5–5.1)
SODIUM: 136 mmol/L (ref 135–145)

## 2016-06-13 MED ORDER — POLYETHYLENE GLYCOL 3350 17 G PO PACK
17.0000 g | PACK | Freq: Two times a day (BID) | ORAL | Status: DC
  Administered 2016-06-13 – 2016-06-16 (×5): 17 g via ORAL
  Filled 2016-06-13 (×5): qty 1

## 2016-06-13 MED ORDER — UNJURY CHICKEN SOUP POWDER
8.0000 [oz_av] | Freq: Two times a day (BID) | ORAL | Status: DC
  Administered 2016-06-13 – 2016-06-16 (×5): 8 [oz_av] via ORAL
  Filled 2016-06-13 (×6): qty 27

## 2016-06-13 NOTE — Progress Notes (Signed)
[facility] Progress Note  7 Days Post-Op  Subjective: CC: did not sleep well overnight. Denies abdominal pain, fever, nausea, vomiting, or dizziness. Pt is passing minimal flatus. Denies BM since surgery. Ambulating with PT - getting fatigued quickly.   Objective: Vital signs in last 24 hours: Temp:  [97.7 F (36.5 C)-98.4 F (36.9 C)] 98 F (36.7 C) (04/05 0442) Pulse Rate:  [73-93] 73 (04/05 0442) Resp:  [18] 18 (04/05 0442) BP: (113-141)/(68-77) 141/77 (04/05 0442) SpO2:  [94 %-97 %] 94 % (04/05 0442) Last BM Date: 06/11/16  Intake/Output from previous day: 04/04 0701 - 04/05 0700 In: 500 [P.O.:480; I.V.:20] Out: 1350 [Urine:1350] Intake/Output this shift: Total I/O In: 240 [P.O.:240] Out: -   PE: Gen: Alert, NAD, pleasant and cooperative HEENT: PERRL Pulm: normal respiratory effort, clear to auscultation bilaterally Abd: Soft, non-tender, moderate distention, bowel sounds present in all 4 quadrants, midline incision c/d/i, staples in place without surrounding erythema Psych: alert and oriented x3   Lab Results:   Recent Labs  06/11/16 0510 06/13/16 0510  WBC 11.4* 9.7  HGB 8.6* 8.1*  HCT 25.8* 24.4*  PLT 241 259   BMET  Recent Labs  06/13/16 0510  NA 136  K 3.8  CL 103  CO2 28  GLUCOSE 105*  BUN 12  CREATININE 1.01  CALCIUM 8.1*   PT/INR No results for input(s): LABPROT, INR in the last 72 hours. CMP     Component Value Date/Time   NA 136 06/13/2016 0510   K 3.8 06/13/2016 0510   CL 103 06/13/2016 0510   CO2 28 06/13/2016 0510   GLUCOSE 105 (H) 06/13/2016 0510   BUN 12 06/13/2016 0510   CREATININE 1.01 06/13/2016 0510   CREATININE 1.35 12/02/2013 1106   CALCIUM 8.1 (L) 06/13/2016 0510   PROT 5.1 (L) 06/10/2016 0500   ALBUMIN 1.8 (L) 06/10/2016 0500   AST 48 (H) 06/10/2016 0500   ALT 26 06/10/2016 0500   ALKPHOS 51 06/10/2016 0500   BILITOT 0.5 06/10/2016 0500   GFRNONAA >60 06/13/2016 0510   GFRNONAA 50 (L)  12/02/2013 1106   GFRAA >60 06/13/2016 0510   GFRAA 57 (L) 12/02/2013 1106   Lipase  No results found for: LIPASE  Studies/Results: Dg Abd 2 Views  Result Date: 06/12/2016 CLINICAL DATA:  Postop ileus EXAM: ABDOMEN - 2 VIEW COMPARISON:  05/28/2016 FINDINGS: There are gaseous distended small bowel loops mid abdomen with some air-fluid level suspicious for ileus or partial bowel obstruction. Moderate gas noted within transverse colon. Midline lower abdomen skin staples. IMPRESSION: Gaseous distended small bowel loops mid abdomen with air-fluid level suspicious for ileus or partial small bowel obstruction. Moderate gas noted within transverse colon. Electronically Signed   By: Lahoma Crocker M.D.   On: 06/12/2016 08:34    Anti-infectives: Anti-infectives    Start     Dose/Rate Route Frequency Ordered Stop   06/06/16 0800  clindamycin (CLEOCIN) 900 mg, gentamicin (GARAMYCIN) 240 mg in sodium chloride 0.9 % 1,000 mL for intraperitoneal lavage  Status:  Discontinued      Intraperitoneal To Surgery 06/05/16 2053 06/06/16 1029   06/06/16 0643  cefoTEtan in Dextrose 5% (CEFOTAN) 2-2.08 GM-% IVPB    Comments:  Key, [name]   : cabinet override      06/06/16 0643 06/06/16 1844   06/06/16 0600  cefoTEtan (CEFOTAN) 2 g in dextrose 5 % 50 mL IVPB     2 g 100 mL/hr over 30 Minutes Intravenous On call to O.R.  06/05/16 2053 06/06/16 0754   06/06/16 0200  neomycin (MYCIFRADIN) tablet 1,000 mg     1,000 mg Oral  Once 06/05/16 2107 06/06/16 0152   06/06/16 0200  metroNIDAZOLE (FLAGYL) tablet 1,000 mg     1,000 mg Oral NOW 06/05/16 2107 06/06/16 0152   06/05/16 2300  neomycin (MYCIFRADIN) tablet 1,000 mg  Status:  Discontinued     1,000 mg Oral  Once 06/05/16 2106 06/05/16 2108   06/05/16 2300  metroNIDAZOLE (FLAGYL) tablet 1,000 mg     1,000 mg Oral NOW 06/05/16 2108 06/05/16 2330   06/05/16 2300  neomycin (MYCIFRADIN) tablet 1,000 mg     1,000 mg Oral  Once 06/05/16 2108 06/05/16 2330   06/05/16 2115   neomycin (MYCIFRADIN) tablet 1,000 mg     1,000 mg Oral NOW 06/05/16 2053 06/05/16 2150   06/05/16 2115  metroNIDAZOLE (FLAGYL) tablet 1,000 mg     1,000 mg Oral NOW 06/05/16 2053 06/05/16 2150   06/05/16 2115  metroNIDAZOLE (FLAGYL) tablet 1,000 mg  Status:  Discontinued     1,000 mg Oral NOW 06/05/16 2106 06/05/16 2108     Assessment/Plan Lower GIB 2/2 diverticulosis of ascending colon/cecum POD#5 S/P Open right hemicolectomy 06/06/16, Dr. Stark Klein  - post-op ileus, nausea/vomiting resolved - clear liquid diet  - await return of bowel function - mobilize! And sit up to chair. - pt denied dulcolax   ABL anemia- s/p transfusion; hgb trending down 9.3 > 8.6 > 8.1  Acute hypoxic respiratory failure  CKD III - Creatinine 1.01  DM2 - BG 119 this AM Hypothyroidism - levothyroxine  BPH - flomax   FEN: clear liquid diet,  ID: cefotetan perioperatively VTE: SCD's   Pain: Tylenol PRN   Plan: continue clear liquids today, add miralax BID. Mobilize! Patient has great bowel sounds, improved compared to prior exam. Hopeful we will be able to advance his diet tomorrow AXR in AM   LOS: 10 days    [name] , [facility] 06/13/2016, 10:54 AM Pager: [phone] Consults: [phone] Mon-Fri 7:00 am-4:30 pm Sat-Sun 7:00 am-11:30 am

## 2016-06-13 NOTE — Clinical Social Work Note (Addendum)
Clinical Social Work Assessment  Patient Details  Name: Zachary Cook MRN: [id number] Date of Birth: 26-Dec-1932  Date of referral:  06/13/16               Reason for consult:  Facility Placement                Permission sought to share information with:    Permission granted to share information::     Name::        Agency::  SNF  Relationship::     Contact Information:     Housing/Transportation Living arrangements for the past 2 months:  Single Family Home Source of Information:  Patient Patient Interpreter Needed:  None Criminal Activity/Legal Involvement Pertinent to Current Situation/Hospitalization:  No - Comment as needed Significant Relationships:  Spouse Lives with:  Spouse Do you feel safe going back to the place where you live?  Yes Need for family participation in patient care:  No (Coment)  Care giving concerns:  PT recommends SNF.   Social Worker assessment / plan:  CSW met with patient at bedside, explain role and reason for social work intervention- assist with SNF placement. Patient agreeable to assessment. Patient reports before hospital admission he was very independent at home with spouse. Patient reports he is hopeful to be able to return home. Patient reports he understands he will need PT.CSW educated patient about SNF placement and process. Patient is agreeable for csw to fax his clinical information and provide bed offers, if he decides to go.  Plan: complete PASSR, provide bed offers.  Employment status:  Retired Health visitor PT Recommendations:  Home with [employer], [place] / Referral to community resources:  [place]  Patient/Family's Response to care: Patient appreciative of CSW intervention.   Patient/Family's Understanding of and Emotional Response to Diagnosis, Current Treatment, and Prognosis:  No family at bedside.   Emotional Assessment Appearance:  Developmentally  appropriate Attitude/Demeanor/Rapport:    Affect (typically observed):  Accepting, Calm Orientation:  Oriented to Self, Oriented to Place, Oriented to  Time, Oriented to Situation Alcohol / Substance use:  Not Applicable Psych involvement (Current and /or in the community):  No (Comment)  Discharge Needs  Concerns to be addressed:  Discharge Planning Concerns, Care Coordination Readmission within the last 30 days:  No Current discharge risk:  Dependent with Mobility Barriers to Discharge:  Continued Medical Work up   [employer], LCSW 06/13/2016, 8:22 AM

## 2016-06-13 NOTE — Progress Notes (Signed)
CSW met with patient at bedside, patient is progressing well. PT recommends HHPT. No SNF placement needed.  CSW signing off at this time.   Kathrin Greathouse, Latanya Presser, MSW Clinical Social Worker 5E and Psychiatric Service Line [phone] 06/13/2016  1:50 PM

## 2016-06-13 NOTE — Progress Notes (Signed)
Physical Therapy Treatment Patient Details Name: Zachary Cook MRN: [id number] DOB: [birth year] Today's Date: 06/13/2016    History of Present Illness 83 yo male admitted with GI bleed. S/P hemicolectomy 3/29. Hx of DM, Barrett's esophagus, s/p coil embolization 05/2016    PT Comments    Pt progressing well with RW.  Pt reports wife is home with him and able to help him as needed. Recommend HHPT.  Follow Up Recommendations  Home health PT;Supervision for mobility/OOB     Equipment Recommendations  Rolling walker with 5" wheels    Recommendations for Other Services       Precautions / Restrictions Precautions Precautions: Fall Restrictions Weight Bearing Restrictions: No    Mobility  Bed Mobility               General bed mobility comments: pt sitting EOB with student nurse  Transfers Overall transfer level: Needs assistance Equipment used: Rolling walker (2 wheeled) Transfers: Sit to/from Stand Sit to Stand: Min guard         General transfer comment: cues for hand placement, especially with stand > sit  Ambulation/Gait Ambulation/Gait assistance: Min guard Ambulation Distance (Feet): 220 Feet Assistive device: Rolling walker (2 wheeled) Gait Pattern/deviations: Step-through pattern     General Gait Details: no LOB and steady with RW.  cues at end of gait, not to sit too early   Stairs            Wheelchair Mobility    Modified Rankin (Stroke Patients Only)       Balance Overall balance assessment: Needs assistance Sitting-balance support: Feet supported;No upper extremity supported Sitting balance-Leahy Scale: Good     Standing balance support: Bilateral upper extremity supported Standing balance-Leahy Scale: Poor Standing balance comment: requires UE assist                            Cognition Arousal/Alertness: Awake/alert Behavior During Therapy: WFL for tasks assessed/performed Overall Cognitive Status: Within  Functional Limits for tasks assessed                                        Exercises General Exercises - Lower Extremity Ankle Circles/Pumps: AAROM;Both;10 reps;Seated Long Arc Quad: AROM;10 reps;Both;Seated Hip ABduction/ADduction: AROM;Both;10 reps;Seated Hip Flexion/Marching: AROM;Seated;10 reps;Both    General Comments        Pertinent Vitals/Pain Pain Assessment: No/denies pain    Home Living                      Prior Function            PT Goals (current goals can now be found in the care plan section) Acute Rehab PT Goals Patient Stated Goal: go home PT Goal Formulation: With patient Time For Goal Achievement: 06/24/16 Potential to Achieve Goals: Good Progress towards PT goals: Progressing toward goals    Frequency    Min 3X/week      PT Plan Current plan remains appropriate    Co-evaluation             End of Session Equipment Utilized During Treatment: Gait belt Activity Tolerance: Patient tolerated treatment well Patient left: in chair;with call bell/phone within reach;with chair alarm set Nurse Communication: Mobility status (nursing student) PT Visit Diagnosis: Muscle weakness (generalized) (M62.81);Difficulty in walking, not elsewhere classified (R26.2)     Time:  7290-2111 PT Time Calculation (min) (ACUTE ONLY): 16 min  Charges:  $Gait Training: 8-22 mins                    G Codes:       Elayne Gruver L. Tamala Julian, [place] Pager [phone] 06/13/2016    Galen Manila 06/13/2016, 12:57 PM

## 2016-06-13 NOTE — Progress Notes (Addendum)
PROGRESS NOTE  Zachary Cook HWE:[id number] DOB: 1933/02/01 DOA: 06/03/2016 PCP: No PCP Per Patient   LOS: 10 days   Brief Narrative: 83 y/o male with a history of diabetes mellitus type 2, Barrett's esophagus, and diverticulosis s/p open right hemicolectomy on 06/06/16 due to diverticulosis of ascending colon/rectum currently on post-operative day 7 with no bowel movement and passing minimal flatulence. Patient has been ambulating with PT for the past 3 days. He has a minimal appetite and is still on a clear liquid diet.   Assessment & Plan: Active Problems:   Diverticulosis of colon with hemorrhage   GI bleed   Ileus following gastrointestinal surgery   Acute blood loss anemia   AKI (acute kidney injury) ([facility])   Type 2 diabetes mellitus (H[facility])   Hypertension   GERD (gastroesophageal reflux disease)   Adult onset hypothyroidism  Post-op ileus on POD#7 following open right hemicolectomy on 06/06/16 Surgery was consulted and advised to resume clear liquid diet and ambulate with assistance while awaiting bowel function return. Patient denied Dulcolax. Surgery advised to add Miralax BID and will reevaluate tomorrow morning. Protein powder supplement was added to patient's diet. TPN will be considered if patient's status does not improve within the next couple days.   Acute blood loss anemia Hgb continuing to trend downwards 9.8 > 9.4 > 9.3 > 8.6 > 8.1 between 4/1-4/5. Will continue to monitor closely and plan to transfuse 1 unit PRBC if Hgb < 8. -Clinically without evidence evidence of active bleed, suspect post op  AKI -Initially on admission, now resolved  Type 2 diabetes mellitus Continue SSI and hold home medications. CBG 129-150 today. Continue to monitor CBG  Hypertension Stable at 150/71. Continue Norvasc, Hydralazine, Catapres as prescribed.   GERD Continue Pepcid  Hypothyroidism Continue Synthroid   DVT prophylaxis: Heparin Code Status: Full Family Communication:  None Disposition Plan: Home with home health PT when stable  Consultants:   General surgery  Procedures:   Open right hemicolectomy 3/29  Antimicrobials:  None   Subjective: Patient is post-op day 7 without a bowel movement despite ambulation with PT for the past 3 days. Nausea and vomiting has resolved. No fever/chills. Patient has continued with clear liquid diet. He is passing minimal flatulence.   Objective: Vitals:   06/12/16 1351 06/12/16 2032 06/13/16 0442 06/13/16 1114  BP: 113/70 (!) 141/68 (!) 141/77 (!) 150/71  Pulse: 93 92 73   Resp: 18 18 18    Temp: 98.4 F (36.9 C) 97.7 F (36.5 C) 98 F (36.7 C)   TempSrc: Oral Oral Oral   SpO2: 97% 96% 94%   Weight:      Height:        Intake/Output Summary (Last 24 hours) at 06/13/16 1336 Last data filed at 06/13/16 1114  Gross per 24 hour  Intake              390 ml  Output              900 ml  Net             -510 ml   Filed Weights   06/03/16 1243 06/03/16 1630  Weight: 79.4 kg (175 lb) 77.1 kg (169 lb 15.6 oz)    Examination: Constitutional: NAD Vitals:   06/12/16 1351 06/12/16 2032 06/13/16 0442 06/13/16 1114  BP: 113/70 (!) 141/68 (!) 141/77 (!) 150/71  Pulse: 93 92 73   Resp: 18 18 18    Temp: 98.4 F (36.9 C) 97.7 F (  36.5 C) 98 F (36.7 C)   TempSrc: Oral Oral Oral   SpO2: 97% 96% 94%   Weight:      Height:       Eyes: PERRL, lids and conjunctivae normal ENMT: Mucous membranes are moist.  Neck: normal Respiratory: clear to auscultation bilaterally, no wheezing, no crackles. Normal respiratory effort. No accessory muscle use.  Cardiovascular: Regular rate and rhythm, no murmurs / rubs / gallops. No LE edema. Abdomen: no tenderness. Bowel sounds positive.  Musculoskeletal: no clubbing / cyanosis. No joint deformity upper and lower extremities. No contractures. Normal muscle tone.  Skin: no rashes, lesions, ulcers. No induration Neurologic: Strength 5/5 in all 4.  Psychiatric: Normal judgment  and insight. Alert and oriented x 3. Normal mood. Flat affect. Questionable confusion as patient has stated for two days in a row that his last bowel movement was 3 days ago.   Data Reviewed: I have personally reviewed following labs and imaging studies  CBC:  Recent Labs Lab 06/07/16 0405 06/08/16 0444 06/09/16 0450 06/09/16 1150 06/10/16 0500 06/11/16 0510 06/13/16 0510  WBC 17.1* 16.7* 16.7* 16.2* 14.1* 11.4* 9.7  NEUTROABS 14.0* 12.9* 13.7*  --   --   --   --   HGB 9.9* 9.1* 9.8* 9.4* 9.3* 8.6* 8.1*  HCT 28.9* 27.0* 29.7* 28.2* 28.1* 25.8* 24.4*  MCV 85.5 87.4 88.4 87.9 87.8 86.9 87.5  PLT 143* 150 195 200 232 241 619   Basic Metabolic Panel:  Recent Labs Lab 06/07/16 0405 06/09/16 0450 06/10/16 0500 06/13/16 0510  NA 139 136 135 136  K 4.1 4.3 4.0 3.8  CL 113* 105 105 103  CO2 21* 22 26 28   GLUCOSE 136* 158* 193* 105*  BUN 19 14 15 12   CREATININE 1.09 1.15 1.09 1.01  CALCIUM 7.8* 8.4* 8.4* 8.1*  PHOS 3.0  --   --   --    GFR: Estimated Creatinine Clearance: 56.1 mL/min (by C-G formula based on SCr of 1.01 mg/dL). Liver Function Tests:  Recent Labs Lab 06/07/16 0405 06/10/16 0500  AST  --  48*  ALT  --  26  ALKPHOS  --  51  BILITOT  --  0.5  PROT  --  5.1*  ALBUMIN 2.1* 1.8*   CBG:  Recent Labs Lab 06/12/16 2200 06/13/16 0045 06/13/16 0438 06/13/16 0724 06/13/16 1135  GLUCAP 124* 129* 102* 119* 150*    Urine analysis:    Component Value Date/Time   COLORURINE YELLOW 03/23/2014 1016   APPEARANCEUR CLEAR 03/23/2014 1016   LABSPEC 1.015 03/23/2014 1016   PHURINE 5.5 03/23/2014 1016   GLUCOSEU >=1000 (A) 03/23/2014 1016   HGBUR NEGATIVE 03/23/2014 1016   BILIRUBINUR NEGATIVE 03/23/2014 1016   KETONESUR NEGATIVE 03/23/2014 1016   UROBILINOGEN 0.2 03/23/2014 1016   NITRITE NEGATIVE 03/23/2014 1016   LEUKOCYTESUR NEGATIVE 03/23/2014 1016    Radiology Studies: Dg Abd 2 Views  Result Date: 06/12/2016 CLINICAL DATA:  Postop ileus EXAM:  ABDOMEN - 2 VIEW COMPARISON:  05/28/2016 FINDINGS: There are gaseous distended small bowel loops mid abdomen with some air-fluid level suspicious for ileus or partial bowel obstruction. Moderate gas noted within transverse colon. Midline lower abdomen skin staples. IMPRESSION: Gaseous distended small bowel loops mid abdomen with air-fluid level suspicious for ileus or partial small bowel obstruction. Moderate gas noted within transverse colon. Electronically Signed   By: Lahoma Crocker M.D.   On: 06/12/2016 08:34     Scheduled Meds: . acetaminophen  1,000 mg Oral Q6H  .  amLODipine  5 mg Oral Daily  . Chlorhexidine Gluconate Cloth  6 each Topical Daily  . cloNIDine  0.2 mg Transdermal Weekly  . famotidine  20 mg Oral BID  . heparin subcutaneous  5,000 Units Subcutaneous Q8H  . insulin aspart  0-15 Units Subcutaneous Q4H  . levothyroxine  125 mcg Oral QAC breakfast  . mouth rinse  15 mL Mouth Rinse BID  . polyethylene glycol  17 g Oral BID  . protein supplement  8 oz Oral BID  . sodium chloride flush  10-40 mL Intracatheter Q12H  . tamsulosin  0.4 mg Oral Daily   Continuous Infusions: None  Donald Siva, PA-Student  Attending MD note  Patient was seen, examined,treatment plan was discussed with the PA-S [facility].  I have personally reviewed the clinical findings, lab, imaging studies and management of this patient in detail. I agree with the documentation, as recorded by the PA-S and changes in above note were in bold green.   Patient is 83 year old male with history of diabetes, Barrett's esophagus, diverticulosis with a recent hospitalization for a GI bleed felt to be diverticular in origin, with a positive nuclear tagged scan, and IR was consulted at that time and he is status post successful coil embolization of the distal branch of the SMA.  He was discharged home in stable condition however was readmitted with recurrent GI bleed from same area.  General surgery was consulted and he was  taken to the operating room on 3/29 for right colectomy.  Slow to improve  On Exam: Gen. exam: Awake, alert, not in any distress Chest: Good air entry bilaterally, no rhonchi or rales CVS: S1-S2 regular, no murmurs Abdomen: Soft, nontender and nondistended, surgical site with clean dressing, no bleeding Neurology: Non-focal Skin: No rash or lesions  Plan  Diverticular bleed status post open right hemicolectomy on 3/29 -Patient's course complicated by postop ileus, management per general surgery, slow to improve -To be considered for TPN per general surgery, however continue clear liquid diet for now  Acute blood loss anemia -Hemoglobin somewhat down trending, most recent on 8.1, no overt bleeding, continue to monitor -May need transfusion tomorrow  Acute kidney injury -Creatinine on admission was elevated to 1.5, improved with fluids and now within normal limits  Hypertension Stable at 147/80. Continue Norvasc, Catapres, Hydralazine.   Type 2 diabetes mellitus Continue SSI, monitor CBGs. Hold home medications.  Hypothyroidism Continue Synthroid 125 mcg  GERD Continue Pepcid.   If 7PM-7AM, please contact night-coverage [email or web address] Password [email or web address] 06/13/2016, 1:36 PM

## 2016-06-14 DIAGNOSIS — K567 Ileus, unspecified: Secondary | ICD-10-CM

## 2016-06-14 LAB — CBC
HEMATOCRIT: 24.1 % — AB (ref 39.0–52.0)
Hemoglobin: 7.8 g/dL — ABNORMAL LOW (ref 13.0–17.0)
MCH: 27.6 pg (ref 26.0–34.0)
MCHC: 32.4 g/dL (ref 30.0–36.0)
MCV: 85.2 fL (ref 78.0–100.0)
Platelets: 306 10*3/uL (ref 150–400)
RBC: 2.83 MIL/uL — ABNORMAL LOW (ref 4.22–5.81)
RDW: 15.3 % (ref 11.5–15.5)
WBC: 7.3 10*3/uL (ref 4.0–10.5)

## 2016-06-14 LAB — GLUCOSE, CAPILLARY
GLUCOSE-CAPILLARY: 192 mg/dL — AB (ref 65–99)
GLUCOSE-CAPILLARY: 232 mg/dL — AB (ref 65–99)
Glucose-Capillary: 107 mg/dL — ABNORMAL HIGH (ref 65–99)
Glucose-Capillary: 135 mg/dL — ABNORMAL HIGH (ref 65–99)
Glucose-Capillary: 182 mg/dL — ABNORMAL HIGH (ref 65–99)

## 2016-06-14 LAB — PREPARE RBC (CROSSMATCH)

## 2016-06-14 MED ORDER — ENSURE ENLIVE PO LIQD
237.0000 mL | Freq: Two times a day (BID) | ORAL | Status: DC
  Administered 2016-06-14 – 2016-06-16 (×4): 237 mL via ORAL

## 2016-06-14 MED ORDER — SODIUM CHLORIDE 0.9 % IV SOLN: Freq: Once | INTRAVENOUS | Status: DC

## 2016-06-14 NOTE — Progress Notes (Signed)
PROGRESS NOTE  Zachary Cook GQQ:[id number] DOB: [birth year] DOA: 06/03/2016 PCP: No PCP Per Patient   LOS: 11 days   Brief Narrative: 83 y/o male with a history of diabetes mellitus type 2, Barrett's esophagus, and diverticulosis s/p open right hemicolectomy on 06/06/16 due to diverticulosis of ascending colon/rectum currently on post-operative day 8 with no bowel movement and passing minimal flatulence. Patient has been ambulating with PT for the past 4 days. He has a minimal appetite and has been taking Miralax BID and Ensure.   Assessment & Plan: Active Problems:   Diverticulosis of colon with hemorrhage   GI bleed   Ileus following gastrointestinal surgery   Acute blood loss anemia   AKI (acute kidney injury) ([place])   Type 2 diabetes mellitus (HCC)   Hypertension   GERD (gastroesophageal reflux disease)   Adult onset hypothyroidism   Malnutrition of moderate degree  Post-op ileus on POD#8 following open right hemicolectomy on 06/06/16 - Patient is yet to have a bowel movement and has been taking Miralax BID. He reports feeling increased bowel motility.  - Surgery was consulted and advised to continue Miralax and advance diet to full liquids and he is receiving ensure supplementation.   Acute blood loss anemia - Hgb continuing to trend downwards 9.8 > 9.4 > 9.3 > 8.6 > 8.1 > 7.8 between 4/1-4/6. No signs of active GI Bleed. - transfuse 1 unit pRBC given trent  AKI - Resolved  Type 2 diabetes mellitus Continue SSI and hold home medications. CBG 107-192 today. Continue to monitor CBG  Hypertension Stable at 113/68. Continue Norvasc, Hydralazine, Catapres as prescribed.   GERD Continue Pepcid  Hypothyroidism Continue Synthroid  DVT prophylaxis: Heparin Code Status: Full Family Communication: No family at bedside Disposition Plan: Home with home health PT when stable  Consultants:   Surgery  Procedures:   Open right hemicolectomy  3/29  Antimicrobials:  None  Subjective: Reports feeling more active bowel motility and has been taking Miralax. He continues to ambulate independently while monitored and has increasing strength. Denies fever/chills, abdominal pain, nausea, vomiting.   Objective: Vitals:   06/13/16 2110 06/14/16 0535 06/14/16 0844 06/14/16 0900  BP: (!) 160/62 (!) 148/65 120/65 113/68  Pulse: 66 71  90  Resp: 18 19  14   Temp: 97.3 F (36.3 C) 98.6 F (37 C)  98.4 F (36.9 C)  TempSrc: Oral Oral  Oral  SpO2: 97% 96%  100%  Weight:      Height:        Intake/Output Summary (Last 24 hours) at 06/14/16 1318 Last data filed at 06/14/16 0920  Gross per 24 hour  Intake               70 ml  Output             1325 ml  Net            -1255 ml   Filed Weights   06/03/16 1243 06/03/16 1630  Weight: 79.4 kg (175 lb) 77.1 kg (169 lb 15.6 oz)    Examination: Constitutional: NAD Vitals:   06/13/16 2110 06/14/16 0535 06/14/16 0844 06/14/16 0900  BP: (!) 160/62 (!) 148/65 120/65 113/68  Pulse: 66 71  90  Resp: 18 19  14   Temp: 97.3 F (36.3 C) 98.6 F (37 C)  98.4 F (36.9 C)  TempSrc: Oral Oral  Oral  SpO2: 97% 96%  100%  Weight:      Height:  Eyes: lids and conjunctivae normal Respiratory: clear to auscultation bilaterally, no wheezing, no crackles. Normal respiratory effort. No accessory muscle use.  Cardiovascular: Regular rate and rhythm, no murmurs / rubs / gallops. 1+ LE edema. Abdomen: no tenderness. Bowel sounds positive and normoactive. Skin: no rashes, lesions, ulcers. No induration Psychiatric: Normal judgment and insight. Normal mood. More animated affect compared to yesterday. Able to answer questions appropriately and does not appear confused.  Data Reviewed: I have personally reviewed following labs and imaging studies  CBC:  Recent Labs Lab 06/08/16 0444 06/09/16 0450 06/09/16 1150 06/10/16 0500 06/11/16 0510 06/13/16 0510 06/14/16 0456  WBC 16.7* 16.7*  16.2* 14.1* 11.4* 9.7 7.3  NEUTROABS 12.9* 13.7*  --   --   --   --   --   HGB 9.1* 9.8* 9.4* 9.3* 8.6* 8.1* 7.8*  HCT 27.0* 29.7* 28.2* 28.1* 25.8* 24.4* 24.1*  MCV 87.4 88.4 87.9 87.8 86.9 87.5 85.2  PLT 150 195 200 232 241 259 017   Basic Metabolic Panel:  Recent Labs Lab 06/09/16 0450 06/10/16 0500 06/13/16 0510  NA 136 135 136  K 4.3 4.0 3.8  CL 105 105 103  CO2 22 26 28   GLUCOSE 158* 193* 105*  BUN 14 15 12   CREATININE 1.15 1.09 1.01  CALCIUM 8.4* 8.4* 8.1*   GFR: Estimated Creatinine Clearance: 56.1 mL/min (by C-G formula based on SCr of 1.01 mg/dL). Liver Function Tests:  Recent Labs Lab 06/10/16 0500  AST 48*  ALT 26  ALKPHOS 51  BILITOT 0.5  PROT 5.1*  ALBUMIN 1.8*   CBG:  Recent Labs Lab 06/13/16 1702 06/13/16 2124 06/14/16 0035 06/14/16 0717 06/14/16 1150  GLUCAP 143* 138* 135* 107* 192*   Urine analysis:    Component Value Date/Time   COLORURINE YELLOW 03/23/2014 1016   APPEARANCEUR CLEAR 03/23/2014 1016   LABSPEC 1.015 03/23/2014 1016   PHURINE 5.5 03/23/2014 1016   GLUCOSEU >=1000 (A) 03/23/2014 1016   HGBUR NEGATIVE 03/23/2014 1016   BILIRUBINUR NEGATIVE 03/23/2014 1016   KETONESUR NEGATIVE 03/23/2014 1016   UROBILINOGEN 0.2 03/23/2014 1016   NITRITE NEGATIVE 03/23/2014 [place] 03/23/2014 1016    Radiology Studies: Dg Abd Portable 1v  Result Date: 06/13/2016 CLINICAL DATA:  Ileus EXAM: PORTABLE ABDOMEN - 1 VIEW COMPARISON:  June 12, 2016 FINDINGS: Multiple loops of small bowel remain mildly dilated. No appreciable air-fluid levels. No free air evident. There are skin staples along the midline abdomen and pelvis. There are surgical clips in the right inguinal region. IMPRESSION: Mild dilatation of multiple loops of small bowel, likely due to ileus. No free air. Similar appearance compared to 1 day prior. Electronically Signed   By: Lowella Grip III M.D.   On: 06/13/2016 14:02     Scheduled Meds: . sodium  chloride   Intravenous Once  . acetaminophen  1,000 mg Oral Q6H  . amLODipine  5 mg Oral Daily  . Chlorhexidine Gluconate Cloth  6 each Topical Daily  . cloNIDine  0.2 mg Transdermal Weekly  . famotidine  20 mg Oral BID  . feeding supplement (ENSURE ENLIVE)  237 mL Oral BID BM  . heparin subcutaneous  5,000 Units Subcutaneous Q8H  . insulin aspart  0-15 Units Subcutaneous Q4H  . levothyroxine  125 mcg Oral QAC breakfast  . mouth rinse  15 mL Mouth Rinse BID  . polyethylene glycol  17 g Oral BID  . protein supplement  8 oz Oral BID  . sodium chloride  flush  10-40 mL Intracatheter Q12H  . tamsulosin  0.4 mg Oral Daily   Continuous Infusions: None  Time spent: 30 minutes  Donald Siva, PA-Student   Attending MD note  Patient was seen, examined,treatment plan was discussed with the PA-S [facility]  I have personally reviewed the clinical findings, lab, imaging studies and management of this patient in detail. I agree with the documentation, as recorded by the PA-S  Patient is 83 year old male with history of diabetes, Barrett's esophagus, diverticulosis with a recent hospitalization for a GI bleed felt to be diverticular in origin, with a positive nuclear tagged scan, and IR was consulted at that time and he is status post successful coil embolization of the distal branch of the SMA. He was discharged home in stable condition however was readmitted with recurrent GI bleed from same area. General surgery was consulted and he was taken to the operating room on 3/29 for right colectomy.  Slow to improve   On Exam: Gen. exam: Awake, alert, not in any distress Chest: Good air entry bilaterally, no rhonchi or rales CVS: S1-S2 regular, no murmurs, 1+ edema Abdomen: Soft, nontender and nondistended Skin: No rash or lesions  Plan  Diverticular bleed status post open right hemicolectomy on 3/29 -Patient's course complicated by postop ileus, management per general surgery, slow to  improve -on full liquid diet, continue  Acute blood loss anemia -Hemoglobin somewhat down trending, transfuse 1U pRBC today for Hb in the 7 range. No active bleeding  Acute kidney injury -Creatinine on admission was elevated to 1.5, improved with fluids and now within normal limits  Hypertension -Continue Norvasc, Catapres, Hydralazine.   Type 2 diabetes mellitus Continue SSI, monitor CBGs. Hold home medications.  Hypothyroidism Continue Synthroid 125 mcg  GERD Continue Pepcid.   Rest as above  Chaise Mahabir M. Cruzita Lederer, MD [facility] [phone]  If 7PM-7AM, please contact night-coverage [email or web address] Password [email or web address]     If 7PM-7AM, please contact night-coverage [email or web address] Password [email or web address] 06/14/2016, 1:18 PM

## 2016-06-14 NOTE — Progress Notes (Signed)
[facility] Progress Note  8 Days Post-Op  Subjective: CC diverticulitis with lower GI bleed. Denies abdominal pain, nausea, vomiting, or fever. Reports increased amount of flatus compared to yesterday but denies BM. Currently sitting up drinking miralax.   Objective: Vital signs in last 24 hours: Temp:  [97.3 F (36.3 C)-98.6 F (37 C)] 98.6 F (37 C) (04/06 0535) Pulse Rate:  [66-77] 71 (04/06 0535) Resp:  [18-19] 19 (04/06 0535) BP: (147-160)/(62-71) 148/65 (04/06 0535) SpO2:  [96 %-97 %] 96 % (04/06 0535) Last BM Date: 06/11/16  Intake/Output from previous day: 04/05 0701 - 04/06 0700 In: 270 [P.O.:240; I.V.:30] Out: 1125 [Urine:1125] Intake/Output this shift: No intake/output data recorded.  PE: Gen: Alert, NAD, pleasant and cooperative HEENT: pupils equal and round Pulm: normal respiratory effort, clear to auscultation bilaterally Abd: Soft, non-tender, mild distention, bowel sounds present in all 4 quadrants, midline incision c/d/i, staples in place without surrounding erythema Psych: alert and oriented x3   Lab Results:   Recent Labs  06/13/16 0510 06/14/16 0456  WBC 9.7 7.3  HGB 8.1* 7.8*  HCT 24.4* 24.1*  PLT 259 306   BMET  Recent Labs  06/13/16 0510  NA 136  K 3.8  CL 103  CO2 28  GLUCOSE 105*  BUN 12  CREATININE 1.01  CALCIUM 8.1*   CMP     Component Value Date/Time   NA 136 06/13/2016 0510   K 3.8 06/13/2016 0510   CL 103 06/13/2016 0510   CO2 28 06/13/2016 0510   GLUCOSE 105 (H) 06/13/2016 0510   BUN 12 06/13/2016 0510   CREATININE 1.01 06/13/2016 0510   CREATININE 1.35 12/02/2013 1106   CALCIUM 8.1 (L) 06/13/2016 0510   PROT 5.1 (L) 06/10/2016 0500   ALBUMIN 1.8 (L) 06/10/2016 0500   AST 48 (H) 06/10/2016 0500   ALT 26 06/10/2016 0500   ALKPHOS 51 06/10/2016 0500   BILITOT 0.5 06/10/2016 0500   GFRNONAA >60 06/13/2016 0510   GFRNONAA 50 (L) 12/02/2013 1106   GFRAA >60 06/13/2016 0510   GFRAA 57 (L)  12/02/2013 1106   Studies/Results: Dg Abd 2 Views  Result Date: 06/12/2016 CLINICAL DATA:  Postop ileus EXAM: ABDOMEN - 2 VIEW COMPARISON:  05/28/2016 FINDINGS: There are gaseous distended small bowel loops mid abdomen with some air-fluid level suspicious for ileus or partial bowel obstruction. Moderate gas noted within transverse colon. Midline lower abdomen skin staples. IMPRESSION: Gaseous distended small bowel loops mid abdomen with air-fluid level suspicious for ileus or partial small bowel obstruction. Moderate gas noted within transverse colon. Electronically Signed   By: Lahoma Crocker M.D.   On: 06/12/2016 08:34   Dg Abd Portable 1v  Result Date: 06/13/2016 CLINICAL DATA:  Ileus EXAM: PORTABLE ABDOMEN - 1 VIEW COMPARISON:  June 12, 2016 FINDINGS: Multiple loops of small bowel remain mildly dilated. No appreciable air-fluid levels. No free air evident. There are skin staples along the midline abdomen and pelvis. There are surgical clips in the right inguinal region. IMPRESSION: Mild dilatation of multiple loops of small bowel, likely due to ileus. No free air. Similar appearance compared to 1 day prior. Electronically Signed   By: Lowella Grip III M.D.   On: 06/13/2016 14:02    Anti-infectives: Anti-infectives    Start     Dose/Rate Route Frequency Ordered Stop   06/06/16 0800  clindamycin (CLEOCIN) 900 mg, gentamicin (GARAMYCIN) 240 mg in sodium chloride 0.9 % 1,000 mL for intraperitoneal lavage  Status:  Discontinued  Intraperitoneal To Surgery 06/05/16 2053 06/06/16 1029   06/06/16 0643  cefoTEtan in Dextrose 5% (CEFOTAN) 2-2.08 GM-% IVPB    Comments:  Key, [name]   : cabinet override      06/06/16 0643 06/06/16 1844   06/06/16 0600  cefoTEtan (CEFOTAN) 2 g in dextrose 5 % 50 mL IVPB     2 g 100 mL/hr over 30 Minutes Intravenous On call to O.R. 06/05/16 2053 06/06/16 0754   06/06/16 0200  neomycin (MYCIFRADIN) tablet 1,000 mg     1,000 mg Oral  Once 06/05/16 2107 06/06/16  0152   06/06/16 0200  metroNIDAZOLE (FLAGYL) tablet 1,000 mg     1,000 mg Oral NOW 06/05/16 2107 06/06/16 0152   06/05/16 2300  neomycin (MYCIFRADIN) tablet 1,000 mg  Status:  Discontinued     1,000 mg Oral  Once 06/05/16 2106 06/05/16 2108   06/05/16 2300  metroNIDAZOLE (FLAGYL) tablet 1,000 mg     1,000 mg Oral NOW 06/05/16 2108 06/05/16 2330   06/05/16 2300  neomycin (MYCIFRADIN) tablet 1,000 mg     1,000 mg Oral  Once 06/05/16 2108 06/05/16 2330   06/05/16 2115  neomycin (MYCIFRADIN) tablet 1,000 mg     1,000 mg Oral NOW 06/05/16 2053 06/05/16 2150   06/05/16 2115  metroNIDAZOLE (FLAGYL) tablet 1,000 mg     1,000 mg Oral NOW 06/05/16 2053 06/05/16 2150   06/05/16 2115  metroNIDAZOLE (FLAGYL) tablet 1,000 mg  Status:  Discontinued     1,000 mg Oral NOW 06/05/16 2106 06/05/16 2108       Assessment/Plan Lower GIB 2/2 diverticulosis of ascending colon/cecum POD#8S/P Open right hemicolectomy 06/06/16, Dr. Stark Klein  - post-op ileus, nausea/vomiting resolved, Miralax BID - AXR w/ mild dilatation of small bowel loops, consistent with ileus - clear liquid diet  - await return of bowel function - mobilize! And sit up to chair.  ABL anemia- s/p transfusion; hgb trending down 9.3 > 8.6 > 8.1 > 7.8  Acute hypoxic respiratory failure  CKD III - Creatinine 1.01   DM2  Hypothyroidism - levothyroxine  BPH - flomax   FEN: full liquid diet,  ID: cefotetan perioperatively VTE: SCD's  Pain: Tylenol PRN   Plan: continue miralax BID, advance diet to full liquids DG abd in AM Transfuse pRBC per medicine     LOS: 11 days    [name] , [facility] 06/14/2016, 8:12 AM Pager: [phone] Consults: [phone] Mon-Fri 7:00 am-4:30 pm Sat-Sun 7:00 am-11:30 am

## 2016-06-15 ENCOUNTER — Inpatient Hospital Stay (HOSPITAL_COMMUNITY): Payer: Medicare Other

## 2016-06-15 DIAGNOSIS — M7989 Other specified soft tissue disorders: Secondary | ICD-10-CM

## 2016-06-15 LAB — BASIC METABOLIC PANEL
Anion gap: 5 (ref 5–15)
BUN: 14 mg/dL (ref 6–20)
CO2: 28 mmol/L (ref 22–32)
Calcium: 8.4 mg/dL — ABNORMAL LOW (ref 8.9–10.3)
Chloride: 103 mmol/L (ref 101–111)
Creatinine, Ser: 1.02 mg/dL (ref 0.61–1.24)
Glucose, Bld: 111 mg/dL — ABNORMAL HIGH (ref 65–99)
POTASSIUM: 4 mmol/L (ref 3.5–5.1)
SODIUM: 136 mmol/L (ref 135–145)

## 2016-06-15 LAB — CBC
HCT: 28.7 % — ABNORMAL LOW (ref 39.0–52.0)
Hemoglobin: 9.4 g/dL — ABNORMAL LOW (ref 13.0–17.0)
MCH: 28.1 pg (ref 26.0–34.0)
MCHC: 32.8 g/dL (ref 30.0–36.0)
MCV: 85.9 fL (ref 78.0–100.0)
PLATELETS: 313 10*3/uL (ref 150–400)
RBC: 3.34 MIL/uL — AB (ref 4.22–5.81)
RDW: 15.8 % — ABNORMAL HIGH (ref 11.5–15.5)
WBC: 8.2 10*3/uL (ref 4.0–10.5)

## 2016-06-15 LAB — GLUCOSE, CAPILLARY
GLUCOSE-CAPILLARY: 102 mg/dL — AB (ref 65–99)
GLUCOSE-CAPILLARY: 119 mg/dL — AB (ref 65–99)
GLUCOSE-CAPILLARY: 128 mg/dL — AB (ref 65–99)
GLUCOSE-CAPILLARY: 150 mg/dL — AB (ref 65–99)
GLUCOSE-CAPILLARY: 208 mg/dL — AB (ref 65–99)
Glucose-Capillary: 215 mg/dL — ABNORMAL HIGH (ref 65–99)
Glucose-Capillary: 201 mg/dL — ABNORMAL HIGH (ref 65–99)

## 2016-06-15 NOTE — Progress Notes (Signed)
9 Days Post-Op  Subjective: Pt states he had BM x 3 overnight Tol FLD  Objective: Vital signs in last 24 hours: Temp:  [97.7 F (36.5 C)-98.8 F (37.1 C)] 98.3 F (36.8 C) (04/07 0412) Pulse Rate:  [59-90] 70 (04/07 0412) Resp:  [14-20] 18 (04/07 0412) BP: (113-152)/(64-88) 142/76 (04/07 0412) SpO2:  [94 %-100 %] 98 % (04/07 0412) Last BM Date: 06/14/16  Intake/Output from previous day: 04/06 0701 - 04/07 0700 In: 490 [P.O.:180; I.V.:10; Blood:300] Out: 1250 [Urine:1250] Intake/Output this shift: No intake/output data recorded.  General appearance: alert and cooperative GI: soft, non-tender; bowel sounds normal; no masses,  no organomegaly and incision c/d/i  Lab Results:   Recent Labs  06/14/16 0456 06/15/16 0430  WBC 7.3 8.2  HGB 7.8* 9.4*  HCT 24.1* 28.7*  PLT 306 313   BMET  Recent Labs  06/13/16 0510 06/15/16 0430  NA 136 136  K 3.8 4.0  CL 103 103  CO2 28 28  GLUCOSE 105* 111*  BUN 12 14  CREATININE 1.01 1.02  CALCIUM 8.1* 8.4*   PT/INR No results for input(s): LABPROT, INR in the last 72 hours. ABG No results for input(s): PHART, HCO3 in the last 72 hours.  Invalid input(s): PCO2, PO2  Studies/Results: Dg Abd Portable 1v  Result Date: 06/15/2016 CLINICAL DATA:  Partial colectomy.  Follow-up ileus. EXAM: PORTABLE ABDOMEN - 1 VIEW COMPARISON:  Two days ago FINDINGS: Partial colectomy with bowel sutures on the right. Generalized gaseous distension of bowel that is overall mild. The small bowel loop in the left upper quadrant measures up to 41 mm diameter. No progression since prior. No concerning mass effect. IMPRESSION: Stable mild generalized gaseous distention of bowel consistent with ileus. Electronically Signed   By: Monte Fantasia M.D.   On: 06/15/2016 07:08   Dg Abd Portable 1v  Result Date: 06/13/2016 CLINICAL DATA:  Ileus EXAM: PORTABLE ABDOMEN - 1 VIEW COMPARISON:  June 12, 2016 FINDINGS: Multiple loops of small bowel remain mildly  dilated. No appreciable air-fluid levels. No free air evident. There are skin staples along the midline abdomen and pelvis. There are surgical clips in the right inguinal region. IMPRESSION: Mild dilatation of multiple loops of small bowel, likely due to ileus. No free air. Similar appearance compared to 1 day prior. Electronically Signed   By: Lowella Grip III M.D.   On: 06/13/2016 14:02    Anti-infectives: Anti-infectives    Start     Dose/Rate Route Frequency Ordered Stop   06/06/16 0800  clindamycin (CLEOCIN) 900 mg, gentamicin (GARAMYCIN) 240 mg in sodium chloride 0.9 % 1,000 mL for intraperitoneal lavage  Status:  Discontinued      Intraperitoneal To Surgery 06/05/16 2053 06/06/16 1029   06/06/16 0643  cefoTEtan in Dextrose 5% (CEFOTAN) 2-2.08 GM-% IVPB    Comments:  Key, [name]   : cabinet override      06/06/16 0643 06/06/16 1844   06/06/16 0600  cefoTEtan (CEFOTAN) 2 g in dextrose 5 % 50 mL IVPB     2 g 100 mL/hr over 30 Minutes Intravenous On call to O.R. 06/05/16 2053 06/06/16 0754   06/06/16 0200  neomycin (MYCIFRADIN) tablet 1,000 mg     1,000 mg Oral  Once 06/05/16 2107 06/06/16 0152   06/06/16 0200  metroNIDAZOLE (FLAGYL) tablet 1,000 mg     1,000 mg Oral NOW 06/05/16 2107 06/06/16 0152   06/05/16 2300  neomycin (MYCIFRADIN) tablet 1,000 mg  Status:  Discontinued  1,000 mg Oral  Once 06/05/16 2106 06/05/16 2108   06/05/16 2300  metroNIDAZOLE (FLAGYL) tablet 1,000 mg     1,000 mg Oral NOW 06/05/16 2108 06/05/16 2330   06/05/16 2300  neomycin (MYCIFRADIN) tablet 1,000 mg     1,000 mg Oral  Once 06/05/16 2108 06/05/16 2330   06/05/16 2115  neomycin (MYCIFRADIN) tablet 1,000 mg     1,000 mg Oral NOW 06/05/16 2053 06/05/16 2150   06/05/16 2115  metroNIDAZOLE (FLAGYL) tablet 1,000 mg     1,000 mg Oral NOW 06/05/16 2053 06/05/16 2150   06/05/16 2115  metroNIDAZOLE (FLAGYL) tablet 1,000 mg  Status:  Discontinued     1,000 mg Oral NOW 06/05/16 2106 06/05/16 2108       Assessment/Plan: s/p Procedure(s): OPEN ASCENDING COLECTOMY (N/A) Advance diet to Soft; appears that ileus is resolving Mobilize Hopefully home in next 1-2 if con't to tol PO  LOS: 12 days    Rosario Jacks., Anne Hahn 06/15/2016

## 2016-06-15 NOTE — Progress Notes (Signed)
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 He was discharged home in stable condition however was readmitted with recurrent GI bleed from same area. General surgery was consulted and he was taken to the operating room on 3/29 for right colectomy. Slow to improve  Assessment & Plan: Active Problems:   Type 2 diabetes mellitus (HCC)   Hypertension   GERD (gastroesophageal reflux disease)   Adult onset hypothyroidism   Acute blood loss anemia   AKI (acute kidney injury) ([place])   Diverticulosis of colon with hemorrhage   GI bleed   Ileus following gastrointestinal surgery   Malnutrition of moderate degree  Diverticular bleed status post open right hemicolectomy on 3/29 -Patient's course complicated by postop ileus, management per general surgery, slow to improve initially but now seems to be resolving.  Continue soft diet, has had bowel movements.  Her surgery, if he is good consistent p.o. intake may be able to be discharged 1-2 days  Acute blood loss anemia -Hemoglobin somewhat down trending, required an additional unit of packed red blood cells on 4/6.  Hemoglobin this morning showed appropriate improvement -No blood in his stool  Acute kidney injury -Creatinine on admission was elevated to 1.5, improved with fluids and now within normal limits  Hypertension -Continue Norvasc, Catapres, Hydralazine.   Type 2 diabetes mellitus Continue SSI, monitor CBGs. Hold home medications.  Hypothyroidism Continue Synthroid 125 mcg  GERD Continue Pepcid.   DVT prophylaxis:  Heparin Code Status: Full Family Communication: No family at bedside Disposition Plan: Home with home health PT when stable  Consultants:   Surgery  Procedures:   Open right hemicolectomy 3/29  Antimicrobials:  None  Subjective: -Has had 3 bowel movements overnight, has no abdominal pain, denies any chest pain shortness of breath.  No nausea.  Eating breakfast  Objective: Vitals:   06/14/16 1350 06/14/16 1700 06/14/16 2024 06/15/16 0412  BP: 124/67 (!) 152/64 129/88 (!) 142/76  Pulse: 83 (!) 59 76 70  Resp: 20 16 18 18   Temp: 98 F (36.7 C) 97.9 F (36.6 C) 98.8 F (37.1 C) 98.3 F (36.8 C)  TempSrc: Oral Oral Oral Oral  SpO2: 98% 94% 96% 98%  Weight:      Height:        Intake/Output Summary (Last 24 hours) at 06/15/16 1048 Last data filed at 06/15/16 1020  Gross per 24 hour  Intake              540 ml  Output             1200 ml  Net             -660 ml   Filed Weights   06/03/16 1243 06/03/16 1630  Weight: 79.4 kg (175 lb) 77.1 kg (169 lb 15.6 oz)    Examination: Constitutional: NAD Vitals:   06/14/16 1350 06/14/16 1700 06/14/16 2024 06/15/16 0412  BP: 124/67 (!) 152/64 129/88 (!) 142/76  Pulse: 83 (!) 59 76 70  Resp: 20 16 18 18   Temp: 98 F (36.7 C) 97.9 F (36.6 C) 98.8 F (37.1 C) 98.3  F (36.8 C)  TempSrc: Oral Oral Oral Oral  SpO2: 98% 94% 96% 98%  Weight:      Height:       Eyes: lids and conjunctivae normal Respiratory: clear to auscultation bilaterally, no wheezing, no crackles. Normal respiratory effort. No accessory muscle use.  Cardiovascular: Regular rate and rhythm, no murmurs / rubs / gallops. 1+ LE edema. Abdomen: no tenderness. Bowel sounds positive and normoactive. Skin: no rashes, lesions, ulcers. No induration Psychiatric: Normal judgment and insight. Normal mood.  Data Reviewed: I have personally reviewed following labs and imaging studies  CBC:  Recent Labs Lab 06/09/16 0450  06/10/16 0500 06/11/16 0510  06/13/16 0510 06/14/16 0456 06/15/16 0430  WBC 16.7*  < > 14.1* 11.4* 9.7 7.3 8.2  NEUTROABS 13.7*  --   --   --   --   --   --   HGB 9.8*  < > 9.3* 8.6* 8.1* 7.8* 9.4*  HCT 29.7*  < > 28.1* 25.8* 24.4* 24.1* 28.7*  MCV 88.4  < > 87.8 86.9 87.5 85.2 85.9  PLT 195  < > 232 241 259 306 313  < > = values in this interval not displayed. Basic Metabolic Panel:  Recent Labs Lab 06/09/16 0450 06/10/16 0500 06/13/16 0510 06/15/16 0430  NA 136 135 136 136  K 4.3 4.0 3.8 4.0  CL 105 105 103 103  CO2 22 26 28 28   GLUCOSE 158* 193* 105* 111*  BUN 14 15 12 14   CREATININE 1.15 1.09 1.01 1.02  CALCIUM 8.4* 8.4* 8.1* 8.4*   GFR: Estimated Creatinine Clearance: 55.5 mL/min (by C-G formula based on SCr of 1.02 mg/dL). Liver Function Tests:  Recent Labs Lab 06/10/16 0500  AST 48*  ALT 26  ALKPHOS 51  BILITOT 0.5  PROT 5.1*  ALBUMIN 1.8*   CBG:  Recent Labs Lab 06/14/16 1653 06/14/16 2020 06/15/16 0007 06/15/16 0358 06/15/16 0736  GLUCAP 232* 182* 150* 102* 128*   Urine analysis:    Component Value Date/Time   COLORURINE YELLOW 03/23/2014 1016   APPEARANCEUR CLEAR 03/23/2014 1016   LABSPEC 1.015 03/23/2014 1016   PHURINE 5.5 03/23/2014 1016   GLUCOSEU >=1000 (A) 03/23/2014 1016   HGBUR NEGATIVE 03/23/2014 1016   BILIRUBINUR NEGATIVE 03/23/2014 1016   KETONESUR NEGATIVE 03/23/2014 1016   UROBILINOGEN 0.2 03/23/2014 1016   NITRITE NEGATIVE 03/23/2014 [place] 03/23/2014 1016    Radiology Studies: Dg Abd Portable 1v  Result Date: 06/15/2016 CLINICAL DATA:  Partial colectomy.  Follow-up ileus. EXAM: PORTABLE ABDOMEN - 1 VIEW COMPARISON:  Two days ago FINDINGS: Partial colectomy with bowel sutures on the right. Generalized gaseous distension of bowel that is overall mild. The small bowel loop in the left upper quadrant measures up to 41 mm diameter. No progression since prior. No concerning mass effect. IMPRESSION: Stable mild generalized gaseous  distention of bowel consistent with ileus. Electronically Signed   By: Monte Fantasia M.D.   On: 06/15/2016 07:08   Dg Abd Portable 1v  Result Date: 06/13/2016 CLINICAL DATA:  Ileus EXAM: PORTABLE ABDOMEN - 1 VIEW COMPARISON:  June 12, 2016 FINDINGS: Multiple loops of small bowel remain mildly dilated. No appreciable air-fluid levels. No free air evident. There are skin staples along the midline abdomen and pelvis. There are surgical clips in the right inguinal region. IMPRESSION: Mild dilatation of multiple loops of small bowel, likely due to ileus. No free air. Similar appearance compared to 1 day prior. Electronically Signed  By: Lowella Grip III M.D.   On: 06/13/2016 14:02     Scheduled Meds: . sodium chloride   Intravenous Once  . acetaminophen  1,000 mg Oral Q6H  . amLODipine  5 mg Oral Daily  . Chlorhexidine Gluconate Cloth  6 each Topical Daily  . cloNIDine  0.2 mg Transdermal Weekly  . famotidine  20 mg Oral BID  . feeding supplement (ENSURE ENLIVE)  237 mL Oral BID BM  . heparin subcutaneous  5,000 Units Subcutaneous Q8H  . insulin aspart  0-15 Units Subcutaneous Q4H  . levothyroxine  125 mcg Oral QAC breakfast  . mouth rinse  15 mL Mouth Rinse BID  . polyethylene glycol  17 g Oral BID  . protein supplement  8 oz Oral BID  . sodium chloride flush  10-40 mL Intracatheter Q12H  . tamsulosin  0.4 mg Oral Daily   Continuous Infusions: None   Zachary Cook M. Cruzita Lederer, MD [facility] [phone]  If 7PM-7AM, please contact night-coverage [email or web address] Password [facility] 06/15/2016, 10:48 AM

## 2016-06-15 NOTE — Progress Notes (Signed)
VASCULAR LAB PRELIMINARY  PRELIMINARY  PRELIMINARY  PRELIMINARY  Right upper extremity venous duplex completed.    Preliminary report:  There is no DVT noted in the right upper extremity.  There is superficial thrombosis noted in the right cephalic vein at the area of the [facility], extending into the mid upper arm.  The thrombus is not completely occluding flow.  Letecia Arps, RVT 06/15/2016, 6:05 PM

## 2016-06-16 DIAGNOSIS — E038 Other specified hypothyroidism: Secondary | ICD-10-CM

## 2016-06-16 LAB — CBC
HCT: 27.3 % — ABNORMAL LOW (ref 39.0–52.0)
HEMOGLOBIN: 8.9 g/dL — AB (ref 13.0–17.0)
MCH: 27.5 pg (ref 26.0–34.0)
MCHC: 32.6 g/dL (ref 30.0–36.0)
MCV: 84.3 fL (ref 78.0–100.0)
Platelets: 335 10*3/uL (ref 150–400)
RBC: 3.24 MIL/uL — AB (ref 4.22–5.81)
RDW: 15.7 % — AB (ref 11.5–15.5)
WBC: 6.6 10*3/uL (ref 4.0–10.5)

## 2016-06-16 LAB — GLUCOSE, CAPILLARY
GLUCOSE-CAPILLARY: 152 mg/dL — AB (ref 65–99)
GLUCOSE-CAPILLARY: 156 mg/dL — AB (ref 65–99)
Glucose-Capillary: 243 mg/dL — ABNORMAL HIGH (ref 65–99)

## 2016-06-16 MED ORDER — AMLODIPINE BESYLATE 5 MG PO TABS: 5.0000 mg | ORAL_TABLET | Freq: Every day | ORAL | 1 refills | Status: DC

## 2016-06-16 MED ORDER — ALTEPLASE 2 MG IJ SOLR
2.0000 mg | Freq: Once | INTRAMUSCULAR | Status: AC
  Administered 2016-06-16: 2 mg
  Filled 2016-06-16: qty 2

## 2016-06-16 NOTE — Discharge Summary (Addendum)
Physician Discharge Summary  [place] Nabor Boateng HQI:[id number] DOB: [birth year] DOA: 06/03/2016  PCP: [name], MD  Admit date: 06/03/2016 Discharge date: 06/16/2016  Admitted From: home Disposition:  home  Recommendations for Outpatient Follow-up:  1. Follow up with Surgery this week for staples removal   Home Health: PT Equipment/Devices: walker  Discharge Condition: stable CODE STATUS: Full code Diet recommendation: regular  HPI: Per Dr. Quincy Simmonds, Zachary Cook is a 83 y.o. male with medical history significant of T2DM, Barrett's and diverticulosis who presented to the emergency room complaining of weakness and feeling that he was going to pass out. Patient was recently discharged after being treated for GI bleed with coil embolization of the distal branch of SMA on 05/28/2016. In the emergency room patient had a large bloody bowel movement and he was found to have hemoglobin of 5.1. Off note patient on previous admission was hypotensive for what he was treated with pressors for 48 hrs.   Hospital Course: Discharge Diagnoses:  Active Problems:   Type 2 diabetes mellitus (HCC)   Hypertension   GERD (gastroesophageal reflux disease)   Adult onset hypothyroidism   Acute blood loss anemia   AKI (acute kidney injury) ([place])   Diverticulosis of colon with hemorrhage   GI bleed   Ileus following gastrointestinal surgery   Malnutrition of moderate degree  Diverticular bleed status post open right hemicolectomy on 3/29 -patient admitted with recurrent diverticular bleed. He was recently hospitalized with same and was s/p coiling. General surgery was consulted for refractory diverticular bleed and patient was taken to the operating room on 3/29 and he had an open right hemicolectomy. Patient's course complicated by postop ileus which was somewhat slow to resolve, but patient eventually improved, had BMs and his diet was advanced and is now tolerating a regular diet. Once he was  cleared by surgery he was discharged home in stable condition with HHPT. He will be followed up in office later this week for staples removal Acute blood loss anemia -clinically his bleeding has stopped, he was transfused approximately 8U pRBC total and 2 units of FFP. Acute kidney injury -Creatinine on admission was elevated to 1.5, improved with fluids and now within normal limits Hypertension -started on norvasc Type 2 diabetes mellitus - continue home medications Hypothyroidism -Continue Synthroid 125 mcg Right arm swelling -due to poor access he had a PICC line place, experienced swelling and an [place] did not show any DVT but superficial thrombosis in the right cephalic vein. His swelling resolved with compresses and arm elevation GERD   Discharge Instructions   Allergies as of 06/16/2016   No Known Allergies     Medication List    TAKE these medications   ACCU-CHEK NANO SMARTVIEW w/Device Kit   amLODipine 5 MG tablet Commonly known as:  NORVASC Take 1 tablet (5 mg total) by mouth daily.   aspirin 81 MG tablet Take 81 mg by mouth daily.   B-D UF III MINI PEN NEEDLES 31G X 5 MM Misc Generic drug:  Insulin Pen Needle USE ONE PER DAY WITH VICTOZA   esomeprazole 40 MG capsule Commonly known as:  NEXIUM TAKE ONE CAPSULE BY MOUTH DAILY AT NOON What changed:  See the new instructions.   ferrous sulfate 325 (65 FE) MG tablet Take 1 tablet (325 mg total) by mouth 3 (three) times daily with meals.   FLUAD 0.5 ML Susy Generic drug:  Influenza Vac A&B Surf Ant Adj   glimepiride 2 MG tablet Commonly known  as:  AMARYL TAKE 2 TABLETS(4 MG) BY MOUTH DAILY BEFORE DINNER What changed:  See the new instructions.   glucose blood test strip Commonly known as:  ACCU-CHEK SMARTVIEW Test blood sugar twice daily   Insulin Glargine 300 UNIT/ML Sopn Commonly known as:  TOUJEO SOLOSTAR Inject 12 Units into the skin daily.   levothyroxine 125 MCG tablet Commonly known as:  SYNTHROID,  LEVOTHROID Take 1 tablet (125 mcg total) by mouth daily before breakfast.   liraglutide 18 MG/3ML Sopn Commonly known as:  VICTOZA ADMINISTER 1.2 MG UNDER THE SKIN DAILY   metFORMIN 750 MG 24 hr tablet Commonly known as:  GLUCOPHAGE-XR TAKE 2 TABLETS(1500 MG) BY MOUTH DAILY WITH BREAKFAST   naproxen sodium 220 MG tablet Commonly known as:  ANAPROX Take 220 mg by mouth 2 (two) times daily as needed (pain).   simvastatin 40 MG tablet Commonly known as:  ZOCOR TAKE 1 TABLET(40 MG) BY MOUTH AT BEDTIME   tamsulosin 0.4 MG Caps capsule Commonly known as:  FLOMAX TAKE 1 CAPSULE BY MOUTH EVERY DAY            Durable Medical Equipment        Start     Ordered   06/16/16 [phone]  For home use only DME Walker  Once    Question:  Patient needs a walker to treat with the following condition  Answer:  Muscular deconditioning   06/16/16 0953     Follow-up [facility]. Schedule an appointment as soon as possible for a visit on 06/20/2016.   Specialty:  General Surgery Why:  for a nurses visit to have abdominal staples removed.   Contact information: [address] [place], MD. Schedule an appointment as soon as possible for a visit.   Specialty:  General Surgery Why:  3-4 weeks for post-operative follow-up Contact information: [address] [place] [address] [phone]        [place] Follow up.   Why:  Home Health Physical Therapy Contact information: [address] [place] [address] [phone]        [name], MD Follow up.   Specialties:  Family Medicine, Sports Medicine Why:  please call to arrange appointment Contact information: [place] [address] [phone]          No Known Allergies  Consultations:  General surgery  Procedures/Studies:  Open right  hemicolectomy 3/29  Dg Chest 2 View  Result Date: 06/10/2016 CLINICAL DATA:  83 year old diabetic male. Follow-up pneumonia. Subsequent encounter. EXAM: CHEST  2 VIEW COMPARISON:  06/03/2016 and 05/28/2016 chest x-ray. No earlier exams available. FINDINGS: Right PICC line tip mid to distal superior vena cava level. Cardiomegaly. Mild pulmonary edema with small pleural effusions. No segmental consolidation. Limited for excluding basilar infiltrate given the small pleural effusion and minimal basilar atelectasis. Calcified tortuous aorta. Gas-filled colon below left hemidiaphragm. No acute osseous abnormality. IMPRESSION: Cardiomegaly. Mild pulmonary edema. Calcified tortuous aorta. Please see above. Electronically Signed   By: Genia Del M.D.   On: 06/10/2016 10:28   Nm Gi Blood Loss  Result Date: 05/28/2016 CLINICAL DATA:  83 year old male with bloody stools. EXAM: NUCLEAR MEDICINE GASTROINTESTINAL BLEEDING SCAN TECHNIQUE: Sequential abdominal images were obtained following intravenous administration of Tc-38mlabeled red blood cells. RADIOPHARMACEUTICALS:  21.1 mCi Tc-921mn-vitro labeled red cells. COMPARISON:  None. FINDINGS: There  is physiologic uptake in the liver spleen and cardiac blood pool as well as uptake within the aorta and IVC. There is radiotracer activity in the right upper abdomen conforming to the location of the hepatic flexure and proximal transverse colon consistent with active GI bleed. IMPRESSION: Active GI bleed in the hepatic flexure/ proximal transverse colon. These results were called by telephone at the time of interpretation on 05/28/2016 at 2:36 am to Dr. Lyda Perone , who verbally acknowledged these results. Electronically Signed   By: Elgie Collard M.D.   On: 05/28/2016 02:58   Ir Angiogram Visceral Selective  Result Date: 05/28/2016 CLINICAL DATA:  Lower GI Possible diverticular bleed EXAM: SELECTIVE VISCERAL ARTERIOGRAPHY ADDITIONAL ARTERIOGRAPHY IR ULTRASOUND  GUIDANCE VASC ACCESS RIGHT IR EMBO ART  VEN HEMORR LYMPH EXTRAV  INC GUIDE ROADMAPPING ANESTHESIA/SEDATION: Intravenous Fentanyl and Versed were administered as conscious sedation during continuous monitoring of the patient's level of consciousness and physiological / cardiorespiratory status by the radiology RN, with a total moderate sedation time of 29 minutes. MEDICATIONS: Lidocaine 1% subcutaneous CONTRAST:  25 mL Visipaque 320 PROCEDURE: The procedure, risks (including but not limited to bleeding, infection, organ damage ), benefits, and alternatives were explained to the patient. Questions regarding the procedure were encouraged and answered. The patient understands and consents to the procedure. Right femoral region prepped and draped in usual sterile fashion. Maximal barrier sterile technique was utilized including caps, mask, sterile gowns, sterile gloves, sterile drape, hand hygiene and skin antiseptic. The right common femoral artery was localized under ultrasound. Under real-time ultrasound guidance, the vessel was accessed with a 21-gauge micropuncture needle, exchanged over a 018 guidewire for a transitional dilator, through which a 035 guidewire was advanced. Over this, a 5 [place] vascular sheath was placed, through which a 5 [place] C2 catheter was advanced and used to selectively catheterize the superior mesenteric artery for selective arteriography . A coaxial renegade microcatheter was then advanced with an angled 018 guidewire in used to selectively catheterize second and third order mesenteric branches of the SMA. Confirmatory arteriography was performed. The catheter was advanced distally into the third order branch supplying the bleeding segment and the branch was embolized with 2 mm coils to cessation of flow, confirmed with contrast injection. The catheters and sheath were removed and hemostasis achieved with the aid of the Exoseal device after confirmatory femoral arteriography. The patient  tolerated the procedure well. COMPLICATIONS: None immediate FINDINGS: Intermittent Active arterial extravasation supplied by a third order branch of the superior mesenteric artery, corresponding to region of extravasation seen on previous scintigraphy. Technically successful 2 mm coil embolization of distal mesentery branch supplying the affected segment. IMPRESSION: 1. Positive for active extravasation into the lumen of the proximal colon from a distal branch of the SMA. 2. Superselective 2 mm coil embolization of the supplying arterial branch performed without complication. Electronically Signed   By: Corlis Leak M.D.   On: 05/28/2016 13:56   Ir Angiogram Visceral Selective  Result Date: 05/28/2016 INDICATION: Acute lower gastrointestinal tract bleeding with positive nuclear medicine bleeding scan localizing to the region of the hepatic flexure/ proximal transverse colon. EXAM: 1. ULTRASOUND GUIDANCE FOR VASCULAR ACCESS OF THE RIGHT COMMON FEMORAL ARTERY 2. SELECTIVE VISCERAL ARTERIOGRAPHY OF THE SUPERIOR MESENTERIC ARTERY 3. ADDITIONAL SELECTIVE ARTERIOGRAPHY OF THE SUPERIOR MESENTERIC ARTERY MEDICATIONS: None ANESTHESIA/SEDATION: Moderate (conscious) sedation was employed during this procedure. A total of Versed 1.0 mg and Fentanyl 25 mcg was administered intravenously. Moderate Sedation Time: 40 minutes. The patient's level of consciousness  and vital signs were monitored continuously by radiology nursing throughout the procedure under my direct supervision. CONTRAST:  78 mL Isovue-300 FLUOROSCOPY TIME:  Fluoroscopy Time: 7 minutes and 30 seconds. 595 mGy. COMPLICATIONS: None immediate. PROCEDURE: Informed consent was obtained from the patient following explanation of the procedure, risks, benefits and alternatives. The patient understands, agrees and consents for the procedure. All questions were addressed. A time out was performed prior to the initiation of the procedure. Maximal barrier sterile technique  utilized including caps, mask, sterile gowns, sterile gloves, large sterile drape, hand hygiene, and Betadine prep. Ultrasound was used to confirm patency of the right common femoral artery. Under ultrasound guidance, access of the artery was performed with a 21 gauge needle and micropuncture set. Ultrasound image documentation was performed. A 5 French vascular sheath was placed. A 5 French Cobra catheter was then advanced into the abdominal aorta an used to selectively catheterize the superior mesenteric artery. Selective arteriography was performed. The catheter was then further advanced passed a pancreaticoduodenal trunk and additional selective arteriography performed in 2 different projections. The Cobra catheter and additional Sos catheter were used to attempt catheterization of the inferior mesenteric artery. Catheters were then removed. Oblique contrast injection was performed at the level of the femoral arteriotomy. Arteriotomy closure was performed with the Cordis Exoseal device. FINDINGS: SMA arteriography initially suggested potentially focal contrast accumulation along the medial aspect of the cecum in a rounded configuration. This was investigated under real-time fluoroscopy and did not show definitive contrast accumulation and was felt to relate to bowel gas. There also was no transit of density into the lumen of the adjacent cecum. No vascular malformations are identified. The IMA origin could not be located or selectively catheterized. IMPRESSION: No definitive active arterial bleeding identified in the SMA distribution. Focal area of density along the medial aspect of the cecum during SMA arteriography did not show persistence with fluoroscopy or transit in the bowel lumen and was felt to most likely relate to bowel gas rather than contrast extravasation. Electronically Signed   By: Aletta Edouard M.D.   On: 05/28/2016 08:35   Ir Angiogram Selective Each Additional Vessel  Result Date:  05/28/2016 CLINICAL DATA:  Lower GI Possible diverticular bleed EXAM: SELECTIVE VISCERAL ARTERIOGRAPHY ADDITIONAL ARTERIOGRAPHY IR ULTRASOUND GUIDANCE VASC ACCESS RIGHT IR EMBO ART  VEN HEMORR LYMPH EXTRAV  INC GUIDE ROADMAPPING ANESTHESIA/SEDATION: Intravenous Fentanyl and Versed were administered as conscious sedation during continuous monitoring of the patient's level of consciousness and physiological / cardiorespiratory status by the radiology RN, with a total moderate sedation time of 29 minutes. MEDICATIONS: Lidocaine 1% subcutaneous CONTRAST:  25 mL Visipaque 320 PROCEDURE: The procedure, risks (including but not limited to bleeding, infection, organ damage ), benefits, and alternatives were explained to the patient. Questions regarding the procedure were encouraged and answered. The patient understands and consents to the procedure. Right femoral region prepped and draped in usual sterile fashion. Maximal barrier sterile technique was utilized including caps, mask, sterile gowns, sterile gloves, sterile drape, hand hygiene and skin antiseptic. The right common femoral artery was localized under ultrasound. Under real-time ultrasound guidance, the vessel was accessed with a 21-gauge micropuncture needle, exchanged over a 018 guidewire for a transitional dilator, through which a 035 guidewire was advanced. Over this, a 5 [place] vascular sheath was placed, through which a 5 [place] C2 catheter was advanced and used to selectively catheterize the superior mesenteric artery for selective arteriography . A coaxial renegade microcatheter was then advanced with an angled  018 guidewire in used to selectively catheterize second and third order mesenteric branches of the SMA. Confirmatory arteriography was performed. The catheter was advanced distally into the third order branch supplying the bleeding segment and the branch was embolized with 2 mm coils to cessation of flow, confirmed with contrast injection. The  catheters and sheath were removed and hemostasis achieved with the aid of the Exoseal device after confirmatory femoral arteriography. The patient tolerated the procedure well. COMPLICATIONS: None immediate FINDINGS: Intermittent Active arterial extravasation supplied by a third order branch of the superior mesenteric artery, corresponding to region of extravasation seen on previous scintigraphy. Technically successful 2 mm coil embolization of distal mesentery branch supplying the affected segment. IMPRESSION: 1. Positive for active extravasation into the lumen of the proximal colon from a distal branch of the SMA. 2. Superselective 2 mm coil embolization of the supplying arterial branch performed without complication. Electronically Signed   By: Lucrezia Europe M.D.   On: 05/28/2016 13:56   Ir Angiogram Selective Each Additional Vessel  Result Date: 05/28/2016 INDICATION: Acute lower gastrointestinal tract bleeding with positive nuclear medicine bleeding scan localizing to the region of the hepatic flexure/ proximal transverse colon. EXAM: 1. ULTRASOUND GUIDANCE FOR VASCULAR ACCESS OF THE RIGHT COMMON FEMORAL ARTERY 2. SELECTIVE VISCERAL ARTERIOGRAPHY OF THE SUPERIOR MESENTERIC ARTERY 3. ADDITIONAL SELECTIVE ARTERIOGRAPHY OF THE SUPERIOR MESENTERIC ARTERY MEDICATIONS: None ANESTHESIA/SEDATION: Moderate (conscious) sedation was employed during this procedure. A total of Versed 1.0 mg and Fentanyl 25 mcg was administered intravenously. Moderate Sedation Time: 40 minutes. The patient's level of consciousness and vital signs were monitored continuously by radiology nursing throughout the procedure under my direct supervision. CONTRAST:  78 mL Isovue-300 FLUOROSCOPY TIME:  Fluoroscopy Time: 7 minutes and 30 seconds. 595 mGy. COMPLICATIONS: None immediate. PROCEDURE: Informed consent was obtained from the patient following explanation of the procedure, risks, benefits and alternatives. The patient understands, agrees and  consents for the procedure. All questions were addressed. A time out was performed prior to the initiation of the procedure. Maximal barrier sterile technique utilized including caps, mask, sterile gowns, sterile gloves, large sterile drape, hand hygiene, and Betadine prep. Ultrasound was used to confirm patency of the right common femoral artery. Under ultrasound guidance, access of the artery was performed with a 21 gauge needle and micropuncture set. Ultrasound image documentation was performed. A 5 French vascular sheath was placed. A 5 French Cobra catheter was then advanced into the abdominal aorta an used to selectively catheterize the superior mesenteric artery. Selective arteriography was performed. The catheter was then further advanced passed a pancreaticoduodenal trunk and additional selective arteriography performed in 2 different projections. The Cobra catheter and additional Sos catheter were used to attempt catheterization of the inferior mesenteric artery. Catheters were then removed. Oblique contrast injection was performed at the level of the femoral arteriotomy. Arteriotomy closure was performed with the Cordis Exoseal device. FINDINGS: SMA arteriography initially suggested potentially focal contrast accumulation along the medial aspect of the cecum in a rounded configuration. This was investigated under real-time fluoroscopy and did not show definitive contrast accumulation and was felt to relate to bowel gas. There also was no transit of density into the lumen of the adjacent cecum. No vascular malformations are identified. The IMA origin could not be located or selectively catheterized. IMPRESSION: No definitive active arterial bleeding identified in the SMA distribution. Focal area of density along the medial aspect of the cecum during SMA arteriography did not show persistence with fluoroscopy or transit in the bowel  lumen and was felt to most likely relate to bowel gas rather than contrast  extravasation. Electronically Signed   By: Aletta Edouard M.D.   On: 05/28/2016 08:35   Ir US Guide Vasc Access Right  Result Date: 05/28/2016 CLINICAL DATA:  Lower GI Possible diverticular bleed EXAM: SELECTIVE VISCERAL ARTERIOGRAPHY ADDITIONAL ARTERIOGRAPHY IR ULTRASOUND GUIDANCE VASC ACCESS RIGHT IR EMBO ART  VEN HEMORR LYMPH EXTRAV  INC GUIDE ROADMAPPING ANESTHESIA/SEDATION: Intravenous Fentanyl and Versed were administered as conscious sedation during continuous monitoring of the patient's level of consciousness and physiological / cardiorespiratory status by the radiology RN, with a total moderate sedation time of 29 minutes. MEDICATIONS: Lidocaine 1% subcutaneous CONTRAST:  25 mL Visipaque 320 PROCEDURE: The procedure, risks (including but not limited to bleeding, infection, organ damage ), benefits, and alternatives were explained to the patient. Questions regarding the procedure were encouraged and answered. The patient understands and consents to the procedure. Right femoral region prepped and draped in usual sterile fashion. Maximal barrier sterile technique was utilized including caps, mask, sterile gowns, sterile gloves, sterile drape, hand hygiene and skin antiseptic. The right common femoral artery was localized under ultrasound. Under real-time ultrasound guidance, the vessel was accessed with a 21-gauge micropuncture needle, exchanged over a 018 guidewire for a transitional dilator, through which a 035 guidewire was advanced. Over this, a 5 [place] vascular sheath was placed, through which a 5 [place] C2 catheter was advanced and used to selectively catheterize the superior mesenteric artery for selective arteriography . A coaxial renegade microcatheter was then advanced with an angled 018 guidewire in used to selectively catheterize second and third order mesenteric branches of the SMA. Confirmatory arteriography was performed. The catheter was advanced distally into the third order branch  supplying the bleeding segment and the branch was embolized with 2 mm coils to cessation of flow, confirmed with contrast injection. The catheters and sheath were removed and hemostasis achieved with the aid of the Exoseal device after confirmatory femoral arteriography. The patient tolerated the procedure well. COMPLICATIONS: None immediate FINDINGS: Intermittent Active arterial extravasation supplied by a third order branch of the superior mesenteric artery, corresponding to region of extravasation seen on previous scintigraphy. Technically successful 2 mm coil embolization of distal mesentery branch supplying the affected segment. IMPRESSION: 1. Positive for active extravasation into the lumen of the proximal colon from a distal branch of the SMA. 2. Superselective 2 mm coil embolization of the supplying arterial branch performed without complication. Electronically Signed   By: Lucrezia Europe M.D.   On: 05/28/2016 13:56   Ir US Guide Vasc Access Right  Result Date: 05/28/2016 INDICATION: Acute lower gastrointestinal tract bleeding with positive nuclear medicine bleeding scan localizing to the region of the hepatic flexure/ proximal transverse colon. EXAM: 1. ULTRASOUND GUIDANCE FOR VASCULAR ACCESS OF THE RIGHT COMMON FEMORAL ARTERY 2. SELECTIVE VISCERAL ARTERIOGRAPHY OF THE SUPERIOR MESENTERIC ARTERY 3. ADDITIONAL SELECTIVE ARTERIOGRAPHY OF THE SUPERIOR MESENTERIC ARTERY MEDICATIONS: None ANESTHESIA/SEDATION: Moderate (conscious) sedation was employed during this procedure. A total of Versed 1.0 mg and Fentanyl 25 mcg was administered intravenously. Moderate Sedation Time: 40 minutes. The patient's level of consciousness and vital signs were monitored continuously by radiology nursing throughout the procedure under my direct supervision. CONTRAST:  78 mL Isovue-300 FLUOROSCOPY TIME:  Fluoroscopy Time: 7 minutes and 30 seconds. 595 mGy. COMPLICATIONS: None immediate. PROCEDURE: Informed consent was obtained from  the patient following explanation of the procedure, risks, benefits and alternatives. The patient understands, agrees and consents for the procedure. All  questions were addressed. A time out was performed prior to the initiation of the procedure. Maximal barrier sterile technique utilized including caps, mask, sterile gowns, sterile gloves, large sterile drape, hand hygiene, and Betadine prep. Ultrasound was used to confirm patency of the right common femoral artery. Under ultrasound guidance, access of the artery was performed with a 21 gauge needle and micropuncture set. Ultrasound image documentation was performed. A 5 French vascular sheath was placed. A 5 French Cobra catheter was then advanced into the abdominal aorta an used to selectively catheterize the superior mesenteric artery. Selective arteriography was performed. The catheter was then further advanced passed a pancreaticoduodenal trunk and additional selective arteriography performed in 2 different projections. The Cobra catheter and additional Sos catheter were used to attempt catheterization of the inferior mesenteric artery. Catheters were then removed. Oblique contrast injection was performed at the level of the femoral arteriotomy. Arteriotomy closure was performed with the Cordis Exoseal device. FINDINGS: SMA arteriography initially suggested potentially focal contrast accumulation along the medial aspect of the cecum in a rounded configuration. This was investigated under real-time fluoroscopy and did not show definitive contrast accumulation and was felt to relate to bowel gas. There also was no transit of density into the lumen of the adjacent cecum. No vascular malformations are identified. The IMA origin could not be located or selectively catheterized. IMPRESSION: No definitive active arterial bleeding identified in the SMA distribution. Focal area of density along the medial aspect of the cecum during SMA arteriography did not show  persistence with fluoroscopy or transit in the bowel lumen and was felt to most likely relate to bowel gas rather than contrast extravasation. Electronically Signed   By: Aletta Edouard M.D.   On: 05/28/2016 08:35   Dg Chest Portable 1 View  Result Date: 06/03/2016 CLINICAL DATA:  83 year old male with the vagal episodes. Subsequent encounter. EXAM: PORTABLE CHEST 1 VIEW COMPARISON:  05/28/2016 chest x-ray FINDINGS: Cardiomegaly. Calcified tortuous aorta. No infiltrate, congestive heart failure or pneumothorax. No plain film evidence of pulmonary malignancy. Right central line removed. IMPRESSION: Cardiomegaly. Calcified tortuous aorta. No infiltrate or congestive heart failure. Electronically Signed   By: Genia Del M.D.   On: 06/03/2016 14:47   Dg Chest Port 1 View  Result Date: 05/28/2016 CLINICAL DATA:  GI bleed.  Line placement. EXAM: PORTABLE CHEST 1 VIEW COMPARISON:  None. FINDINGS: Low lung volumes. Borderline cardiac enlargement. Thoracic atherosclerosis. RIGHT IJ central venous line tip SVC RA junction. No pneumothorax. Bibasilar scarring without infiltrates or significant atelectasis. No effusion. Osteopenia. IMPRESSION: Chronic changes as described. RIGHT IJ catheter tip SVC RA junction.  No pneumothorax. Electronically Signed   By: Staci Righter M.D.   On: 05/28/2016 09:20   Dg Abd 2 Views  Result Date: 06/12/2016 CLINICAL DATA:  Postop ileus EXAM: ABDOMEN - 2 VIEW COMPARISON:  05/28/2016 FINDINGS: There are gaseous distended small bowel loops mid abdomen with some air-fluid level suspicious for ileus or partial bowel obstruction. Moderate gas noted within transverse colon. Midline lower abdomen skin staples. IMPRESSION: Gaseous distended small bowel loops mid abdomen with air-fluid level suspicious for ileus or partial small bowel obstruction. Moderate gas noted within transverse colon. Electronically Signed   By: Lahoma Crocker M.D.   On: 06/12/2016 08:34   Dg Abd Portable 1v  Result  Date: 06/15/2016 CLINICAL DATA:  Partial colectomy.  Follow-up ileus. EXAM: PORTABLE ABDOMEN - 1 VIEW COMPARISON:  Two days ago FINDINGS: Partial colectomy with bowel sutures on the right. Generalized gaseous distension  of bowel that is overall mild. The small bowel loop in the left upper quadrant measures up to 41 mm diameter. No progression since prior. No concerning mass effect. IMPRESSION: Stable mild generalized gaseous distention of bowel consistent with ileus. Electronically Signed   By: Monte Fantasia M.D.   On: 06/15/2016 07:08   Dg Abd Portable 1v  Result Date: 06/13/2016 CLINICAL DATA:  Ileus EXAM: PORTABLE ABDOMEN - 1 VIEW COMPARISON:  June 12, 2016 FINDINGS: Multiple loops of small bowel remain mildly dilated. No appreciable air-fluid levels. No free air evident. There are skin staples along the midline abdomen and pelvis. There are surgical clips in the right inguinal region. IMPRESSION: Mild dilatation of multiple loops of small bowel, likely due to ileus. No free air. Similar appearance compared to 1 day prior. Electronically Signed   By: Lowella Grip III M.D.   On: 06/13/2016 14:02   Ir Embo Art  [place] Guide Roadmapping  Result Date: 05/28/2016 CLINICAL DATA:  Lower GI Possible diverticular bleed EXAM: SELECTIVE VISCERAL ARTERIOGRAPHY ADDITIONAL ARTERIOGRAPHY IR ULTRASOUND GUIDANCE VASC ACCESS RIGHT IR EMBO ART  VEN HEMORR LYMPH EXTRAV  INC GUIDE ROADMAPPING ANESTHESIA/SEDATION: Intravenous Fentanyl and Versed were administered as conscious sedation during continuous monitoring of the patient's level of consciousness and physiological / cardiorespiratory status by the radiology RN, with a total moderate sedation time of 29 minutes. MEDICATIONS: Lidocaine 1% subcutaneous CONTRAST:  25 mL Visipaque 320 PROCEDURE: The procedure, risks (including but not limited to bleeding, infection, organ damage ), benefits, and alternatives were explained to the patient. Questions  regarding the procedure were encouraged and answered. The patient understands and consents to the procedure. Right femoral region prepped and draped in usual sterile fashion. Maximal barrier sterile technique was utilized including caps, mask, sterile gowns, sterile gloves, sterile drape, hand hygiene and skin antiseptic. The right common femoral artery was localized under ultrasound. Under real-time ultrasound guidance, the vessel was accessed with a 21-gauge micropuncture needle, exchanged over a 018 guidewire for a transitional dilator, through which a 035 guidewire was advanced. Over this, a 5 [place] vascular sheath was placed, through which a 5 [place] C2 catheter was advanced and used to selectively catheterize the superior mesenteric artery for selective arteriography . A coaxial renegade microcatheter was then advanced with an angled 018 guidewire in used to selectively catheterize second and third order mesenteric branches of the SMA. Confirmatory arteriography was performed. The catheter was advanced distally into the third order branch supplying the bleeding segment and the branch was embolized with 2 mm coils to cessation of flow, confirmed with contrast injection. The catheters and sheath were removed and hemostasis achieved with the aid of the Exoseal device after confirmatory femoral arteriography. The patient tolerated the procedure well. COMPLICATIONS: None immediate FINDINGS: Intermittent Active arterial extravasation supplied by a third order branch of the superior mesenteric artery, corresponding to region of extravasation seen on previous scintigraphy. Technically successful 2 mm coil embolization of distal mesentery branch supplying the affected segment. IMPRESSION: 1. Positive for active extravasation into the lumen of the proximal colon from a distal branch of the SMA. 2. Superselective 2 mm coil embolization of the supplying arterial branch performed without complication. Electronically  Signed   By: Lucrezia Europe M.D.   On: 05/28/2016 13:56     Subjective: - no chest pain, shortness of breath, no abdominal pain, nausea or vomiting.   Discharge Exam: Vitals:   06/15/16 2117 06/16/16 0629  BP: (!) 142/78 136/79  Pulse: 67 77  Resp: 18 18  Temp: 97.5 F (36.4 C) 98.6 F (37 C)   Vitals:   06/15/16 0412 06/15/16 1305 06/15/16 2117 06/16/16 0629  BP: (!) 142/76 (!) 142/79 (!) 142/78 136/79  Pulse: 70 84 67 77  Resp: '18 18 18 18  '$ Temp: 98.3 F (36.8 C) 98.6 F (37 C) 97.5 F (36.4 C) 98.6 F (37 C)  TempSrc: Oral Oral Oral Oral  SpO2: 98% 99% 99% 93%  Weight:      Height:        General: Pt is alert, awake, not in acute distress Cardiovascular: RRR, S1/S2 +, no rubs, no gallops Respiratory: CTA bilaterally, no wheezing, no rhonchi Abdominal: Soft, NT, ND, bowel sounds + Extremities: no edema, no cyanosis    The results of significant diagnostics from this hospitalization (including imaging, microbiology, ancillary and laboratory) are listed below for reference.     Microbiology: No results found for this or any previous visit (from the past 240 hour(s)).   Labs: BNP (last 3 results) No results for input(s): BNP in the last 8760 hours. Basic Metabolic Panel:  Recent Labs Lab 06/10/16 0500 06/13/16 0510 06/15/16 0430  NA 135 136 136  K 4.0 3.8 4.0  CL 105 103 103  CO2 '26 28 28  '$ GLUCOSE 193* 105* 111*  BUN '15 12 14  '$ CREATININE 1.09 1.01 1.02  CALCIUM 8.4* 8.1* 8.4*   Liver Function Tests:  Recent Labs Lab 06/10/16 0500  AST 48*  ALT 26  ALKPHOS 51  BILITOT 0.5  PROT 5.1*  ALBUMIN 1.8*   No results for input(s): LIPASE, AMYLASE in the last 168 hours. No results for input(s): AMMONIA in the last 168 hours. CBC:  Recent Labs Lab 06/11/16 0510 06/13/16 0510 06/14/16 0456 06/15/16 0430 06/16/16 0431  WBC 11.4* 9.7 7.3 8.2 6.6  HGB 8.6* 8.1* 7.8* 9.4* 8.9*  HCT 25.8* 24.4* 24.1* 28.7* 27.3*  MCV 86.9 87.5 85.2 85.9 84.3  PLT  241 259 306 313 335   Cardiac Enzymes: No results for input(s): CKTOTAL, CKMB, CKMBINDEX, TROPONINI in the last 168 hours. BNP: Invalid input(s): POCBNP CBG:  Recent Labs Lab 06/15/16 1950 06/15/16 2349 06/16/16 0349 06/16/16 0725 06/16/16 1151  GLUCAP 201* 119* 152* 156* 243*   D-Dimer No results for input(s): DDIMER in the last 72 hours. Hgb A1c No results for input(s): HGBA1C in the last 72 hours. Lipid Profile No results for input(s): CHOL, HDL, LDLCALC, TRIG, CHOLHDL, LDLDIRECT in the last 72 hours. Thyroid function studies No results for input(s): TSH, T4TOTAL, T3FREE, THYROIDAB in the last 72 hours.  Invalid input(s): FREET3 Anemia work up No results for input(s): VITAMINB12, FOLATE, FERRITIN, TIBC, IRON, RETICCTPCT in the last 72 hours. Urinalysis    Component Value Date/Time   COLORURINE YELLOW 03/23/2014 1016   APPEARANCEUR CLEAR 03/23/2014 1016   LABSPEC 1.015 03/23/2014 1016   PHURINE 5.5 03/23/2014 1016   GLUCOSEU >=1000 (A) 03/23/2014 1016   HGBUR NEGATIVE 03/23/2014 1016   BILIRUBINUR NEGATIVE 03/23/2014 1016   KETONESUR NEGATIVE 03/23/2014 1016   UROBILINOGEN 0.2 03/23/2014 1016   NITRITE NEGATIVE 03/23/2014 1016   LEUKOCYTESUR NEGATIVE 03/23/2014 1016   Sepsis Labs Invalid input(s): PROCALCITONIN,  WBC,  LACTICIDVEN Microbiology No results found for this or any previous visit (from the past 240 hour(s)).   Time coordinating discharge: 40 minutes  SIGNED:  Marzetta Board, MD  [facility] 06/16/2016, 1:06 PM Pager [phone]  If 7PM-7AM, please contact night-coverage [email or web address] Password [email or web address]

## 2016-06-16 NOTE — Progress Notes (Signed)
Pt left at this time with his spouse. Alert, oriented, and without c/o. Discharge instructions/prescription given/explained with pt and spouse verbalizing understanding. Pt and spouse aware of multiple followup appointments. Pt left with glasses.

## 2016-06-16 NOTE — Care Management Note (Signed)
Case Management Note  Patient Details  Name: Zachary Cook MRN: [id number] Date of Birth: [birth year]  Subjective/Objective:    Type DM II, HTN, Diverticular bleed s/p open right hemicolectomy                Action/Plan: Discharge Planning: NCM spoke to pt's wife and offered choice for [facility]. Wife requested [facility] for [facility]. Contacted [facility] Liaison for [facility] PT. Pt has RW with seat at home.   PCP Merri Ray MD  Expected Discharge Date:  06/16/16               Expected Discharge Plan:  [place]  In-House Referral:  NA  Discharge planning Services  CM Consult  Post Acute Care Choice:  Home Health Choice offered to:  Spouse  DME Arranged:  N/A DME Agency:  NA  HH Arranged:  PT [place] Agency:  [place]  Status of Service:  Completed, signed off  If discussed at [place] of Stay Meetings, dates discussed:    Additional Comments:  Erenest Rasher, RN 06/16/2016, 10:59 AM

## 2016-06-16 NOTE — Progress Notes (Signed)
10 Days Post-Op  Subjective: Pt with no issues overnight +BMs Tol PO  Objective: Vital signs in last 24 hours: Temp:  [97.5 F (36.4 C)-98.6 F (37 C)] 98.6 F (37 C) (04/08 0629) Pulse Rate:  [67-84] 77 (04/08 0629) Resp:  [18] 18 (04/08 0629) BP: (136-142)/(78-79) 136/79 (04/08 0629) SpO2:  [93 %-99 %] 93 % (04/08 0629) Last BM Date: 06/15/16  Intake/Output from previous day: 04/07 0701 - 04/08 0700 In: 380 [P.O.:360; I.V.:20] Out: 2330 [Urine:2330] Intake/Output this shift: No intake/output data recorded.  General appearance: alert and cooperative GI: soft, non-tender; bowel sounds normal; no masses,  no organomegaly and incision c/d/i, no erythema  Lab Results:   Recent Labs  06/15/16 0430 06/16/16 0431  WBC 8.2 6.6  HGB 9.4* 8.9*  HCT 28.7* 27.3*  PLT 313 335   BMET  Recent Labs  06/15/16 0430  NA 136  K 4.0  CL 103  CO2 28  GLUCOSE 111*  BUN 14  CREATININE 1.02  CALCIUM 8.4*   PT/INR No results for input(s): LABPROT, INR in the last 72 hours. ABG No results for input(s): PHART, HCO3 in the last 72 hours.  Invalid input(s): PCO2, PO2  Studies/Results: Dg Abd Portable 1v  Result Date: 06/15/2016 CLINICAL DATA:  Partial colectomy.  Follow-up ileus. EXAM: PORTABLE ABDOMEN - 1 VIEW COMPARISON:  Two days ago FINDINGS: Partial colectomy with bowel sutures on the right. Generalized gaseous distension of bowel that is overall mild. The small bowel loop in the left upper quadrant measures up to 41 mm diameter. No progression since prior. No concerning mass effect. IMPRESSION: Stable mild generalized gaseous distention of bowel consistent with ileus. Electronically Signed   By: Monte Fantasia M.D.   On: 06/15/2016 07:08    Anti-infectives: Anti-infectives    Start     Dose/Rate Route Frequency Ordered Stop   06/06/16 0800  clindamycin (CLEOCIN) 900 mg, gentamicin (GARAMYCIN) 240 mg in sodium chloride 0.9 % 1,000 mL for intraperitoneal lavage  Status:   Discontinued      Intraperitoneal To Surgery 06/05/16 2053 06/06/16 1029   06/06/16 0643  cefoTEtan in Dextrose 5% (CEFOTAN) 2-2.08 GM-% IVPB    Comments:  Key, [name]   : cabinet override      06/06/16 0643 06/06/16 1844   06/06/16 0600  cefoTEtan (CEFOTAN) 2 g in dextrose 5 % 50 mL IVPB     2 g 100 mL/hr over 30 Minutes Intravenous On call to O.R. 06/05/16 2053 06/06/16 0754   06/06/16 0200  neomycin (MYCIFRADIN) tablet 1,000 mg     1,000 mg Oral  Once 06/05/16 2107 06/06/16 0152   06/06/16 0200  metroNIDAZOLE (FLAGYL) tablet 1,000 mg     1,000 mg Oral NOW 06/05/16 2107 06/06/16 0152   06/05/16 2300  neomycin (MYCIFRADIN) tablet 1,000 mg  Status:  Discontinued     1,000 mg Oral  Once 06/05/16 2106 06/05/16 2108   06/05/16 2300  metroNIDAZOLE (FLAGYL) tablet 1,000 mg     1,000 mg Oral NOW 06/05/16 2108 06/05/16 2330   06/05/16 2300  neomycin (MYCIFRADIN) tablet 1,000 mg     1,000 mg Oral  Once 06/05/16 2108 06/05/16 2330   06/05/16 2115  neomycin (MYCIFRADIN) tablet 1,000 mg     1,000 mg Oral NOW 06/05/16 2053 06/05/16 2150   06/05/16 2115  metroNIDAZOLE (FLAGYL) tablet 1,000 mg     1,000 mg Oral NOW 06/05/16 2053 06/05/16 2150   06/05/16 2115  metroNIDAZOLE (FLAGYL) tablet 1,000 mg  Status:  Discontinued     1,000 mg Oral NOW 06/05/16 2106 06/05/16 2108      Assessment/Plan: s/p Procedure(s): OPEN ASCENDING COLECTOMY (N/A) -Adv diet to reg -Ok for home from surgery standpoint. -will have him set up for staple removal later this week   LOS: 13 days    Rosario Jacks., Anne Hahn 06/16/2016

## 2016-06-17 LAB — TYPE AND SCREEN
ABO/RH(D): A POS
Antibody Screen: NEGATIVE
Unit division: 0

## 2016-06-17 LAB — BPAM RBC
Blood Product Expiration Date: 201804252359
ISSUE DATE / TIME: 201804061322
Unit Type and Rh: 6200

## 2016-06-18 ENCOUNTER — Telehealth: Payer: Self-pay | Admitting: Family Medicine

## 2016-06-18 NOTE — Telephone Encounter (Signed)
DR Carlota Raspberry PT GRAND DAUGHTER CALLED STATING THAT PT HAD DECLINED HELP FROM HOME HEALTH BUT HE GOT HOME AND CHANGED HIS MIND HE NEED PHYSICAL THERAPY [place] FAX # [phone] ATTN: [place]

## 2016-06-18 NOTE — Telephone Encounter (Signed)
Will you refer?

## 2016-06-19 NOTE — Telephone Encounter (Signed)
See page from [place]. Advised them on that fax that he has changed his mind, and now does want home health PT. Should not need any referral at this point. Let me know if new referral is needed.  Additionally he needs to be scheduled to follow-up with me within the next week if possible as recommended on hospital discharge summary. I have not seen him since September 2017, and needs to be seen if I am listed as his primary care provider.

## 2016-06-20 ENCOUNTER — Telehealth: Payer: Self-pay | Admitting: Family Medicine

## 2016-06-20 DIAGNOSIS — R269 Unspecified abnormalities of gait and mobility: Secondary | ICD-10-CM

## 2016-06-20 NOTE — Telephone Encounter (Signed)
Please place referral if appropriate.

## 2016-06-20 NOTE — Telephone Encounter (Signed)
Pt's wife calling about [place] orders. Pt was contacted by [place] the day he returned home from the hospital and said he did not want to do Physical Therapy at that time. Pt actually does want Physical Therapy and Home Health said orders needed to be placed again. Please advise. Pt callback number is [phone].

## 2016-06-20 NOTE — Telephone Encounter (Signed)
This appears to have been ordered in the hospital, but can order that again for them. See other message regarding physical therapy referral. We'll need to check to see if that is something I can complete or if it needs to be reordered by the physician in the hospital.

## 2016-06-20 NOTE — Telephone Encounter (Signed)
Please finish rx if appropriate

## 2016-06-20 NOTE — Telephone Encounter (Signed)
Pt's wife calling requesting an order be put in for a walker for him. Wants a walker with two wheels on the back and stoppers on the front and a seat if possible. Pt was not sure if we ordered these or if the hospital had to.

## 2016-06-20 NOTE — Telephone Encounter (Signed)
I do not mind referring him, but order is asking questions regarding his status based on an encounter with him. As I have not seen him since hospital follow-up, I'm not sure if that is something I can do. Will check into this further tomorrow.

## 2016-06-21 NOTE — Telephone Encounter (Signed)
Faxed to [facility].

## 2016-06-27 ENCOUNTER — Ambulatory Visit: Payer: Self-pay | Admitting: Internal Medicine

## 2016-06-28 ENCOUNTER — Telehealth: Payer: Self-pay | Admitting: *Deleted

## 2016-06-28 NOTE — Telephone Encounter (Signed)
No show letter mailed to patient. 

## 2016-06-28 NOTE — Telephone Encounter (Signed)
Does [facility] still need a new order, or were they able to reopen prior order?

## 2016-07-01 ENCOUNTER — Other Ambulatory Visit: Payer: Self-pay

## 2016-07-01 NOTE — Telephone Encounter (Signed)
Msg sent to [facility] at [facility] for clarification.

## 2016-07-02 NOTE — Telephone Encounter (Signed)
I tried to place that order, but at the end of the order it is having me attest that my findings are based on a face to face encounter with him that I have not done. Are they able to accept a different order as this was already ordered through the hospital? If not, may need to be seen in office by a provide, as I have not seen him since 12/04/15.  He was supposed to be seen for hospital follow up as well. Let me know what I can do in the meantime to help get him home health PT.

## 2016-07-02 NOTE — Telephone Encounter (Signed)
Yes, we do need new orders placed. Once placed I can send through Epic to get the process started with [facility]. Thank you!

## 2016-07-04 ENCOUNTER — Ambulatory Visit: Payer: Self-pay | Admitting: Endocrinology

## 2016-07-04 ENCOUNTER — Other Ambulatory Visit: Payer: Self-pay | Admitting: Family Medicine

## 2016-07-09 NOTE — Telephone Encounter (Signed)
It seems like we will need to schedule him an apt. I will call the pt and see if he is willing to come in for his hosp f/u at which point we can iron out the PT orders.

## 2016-07-09 NOTE — Telephone Encounter (Signed)
Spoke with pt. He states that he is doing well with exercising at home and that he does not believe that PT would not be of any benefit at this time. Will keep scheduled hospital follow up apt with Dr Carlota Raspberry on 5/24.

## 2016-07-12 ENCOUNTER — Other Ambulatory Visit (INDEPENDENT_AMBULATORY_CARE_PROVIDER_SITE_OTHER): Payer: Medicare Other

## 2016-07-12 DIAGNOSIS — E1165 Type 2 diabetes mellitus with hyperglycemia: Secondary | ICD-10-CM | POA: Diagnosis not present

## 2016-07-12 LAB — BASIC METABOLIC PANEL
BUN: 21 mg/dL (ref 6–23)
CHLORIDE: 107 meq/L (ref 96–112)
CO2: 26 mEq/L (ref 19–32)
CREATININE: 1.32 mg/dL (ref 0.40–1.50)
Calcium: 9.3 mg/dL (ref 8.4–10.5)
GFR: 55.2 mL/min — ABNORMAL LOW (ref >60.00)
Glucose, Bld: 155 mg/dL — ABNORMAL HIGH (ref 70–99)
POTASSIUM: 4.8 meq/L (ref 3.5–5.1)
Sodium: 139 mEq/L (ref 135–145)

## 2016-07-12 LAB — MICROALBUMIN / CREATININE URINE RATIO
Creatinine,U: 266.6 mg/dL
MICROALB UR: 14 mg/dL — AB (ref 0.0–1.9)
MICROALB/CREAT RATIO: 5.3 mg/g (ref 0.0–30.0)

## 2016-07-12 LAB — HEMOGLOBIN A1C: HEMOGLOBIN A1C: 6.3 % (ref 4.6–6.5)

## 2016-07-12 LAB — TSH: TSH: 0.53 u[IU]/mL (ref 0.35–4.50)

## 2016-07-15 ENCOUNTER — Other Ambulatory Visit: Payer: Self-pay | Admitting: Physician Assistant

## 2016-07-15 ENCOUNTER — Other Ambulatory Visit: Payer: Self-pay | Admitting: Family Medicine

## 2016-07-15 ENCOUNTER — Other Ambulatory Visit: Payer: Self-pay | Admitting: Endocrinology

## 2016-07-15 DIAGNOSIS — E785 Hyperlipidemia, unspecified: Secondary | ICD-10-CM

## 2016-07-15 DIAGNOSIS — K2270 Barrett's esophagus without dysplasia: Secondary | ICD-10-CM

## 2016-07-17 ENCOUNTER — Ambulatory Visit (INDEPENDENT_AMBULATORY_CARE_PROVIDER_SITE_OTHER): Payer: Medicare Other | Admitting: Endocrinology

## 2016-07-17 ENCOUNTER — Encounter: Payer: Self-pay | Admitting: Endocrinology

## 2016-07-17 VITALS — BP 138/84 | HR 67 | Ht 65.0 in | Wt 166.6 lb

## 2016-07-17 DIAGNOSIS — E039 Hypothyroidism, unspecified: Secondary | ICD-10-CM | POA: Diagnosis not present

## 2016-07-17 DIAGNOSIS — E1165 Type 2 diabetes mellitus with hyperglycemia: Secondary | ICD-10-CM | POA: Diagnosis not present

## 2016-07-17 MED ORDER — LEVOTHYROXINE SODIUM 112 MCG PO TABS: 112.0000 ug | ORAL_TABLET | Freq: Every day | ORAL | 5 refills | Status: DC

## 2016-07-17 NOTE — Progress Notes (Signed)
Patient ID: Zachary Cook, male   DOB: 04/07/1932, 84 y.o.   MRN: [id number]           Reason for Appointment: Follow-Up    History of Present Illness:          Diagnosis: Type 2 diabetes mellitus, date of diagnosis: 2005       Past history: He was started on metformin when he was found to have diabetes on routine lab work. Details of this are not available He thinks his blood sugars had been fairly well controlled for several years with metformin alone but no records are available He has had a couple of different physicians and has not always been followed consistently for his diabetes  Blood sugars had been significantly higher in the 4 months prior to his consultation in 10/15 and his A1c had gone up to 12% with his regimen of glipizide and metformin  He was given a trial of Invokana but this did not improve his blood sugars He has been on Victoza since 10/15 and this was restarted in 1/16, initially had difficulty with nausea  Recent history:    INSULIN regimen: Toujeo 14 units daily  Non-insulin hypoglycemic drugs : Metformin ER 750 mg, 2 tablets daily, Amaryl 2 mg at supper, Victoza 1.2 mg daily  Since his blood sugars were high especially fasting he was started on Toujeo in 1/18  His A1c is markedly improved at 6.3, previously 8.4  Current blood sugar patterns and problems identified:  He has been hospitalized in March and had lost a lot of weight  He has cut back on his insulin on his own even though fasting readings have not been low, now taking 4 units less than what he was taking before  Not checking readings after meals lately, previously was getting some readings over 250  However he says he is eating a third of what he was previously eating and still does not have much appetite, no nausea Victoza  He has been able to resume with exercise and is walking on the treadmill regularly  Now eating 3 meals a day       Glucose monitoring:  done 1.4 times a day          Glucometer: Accu-Chek       Blood Glucose readings  Recently readings from download  RECENT range 64-130 with median 104 with most readings between 7 AM and 12 noon   Self-care: The diet that the patient has been following is: None, usually low fat      Meals: 2-3 meals per day, dinner 6-7 . Breakfast is usually egg/oatmeal; sometimes has bran flakes at 10 am; fried food occasionally          Exercise: walking on treadmill daily Dietician visit, most recent: None.               Weight history: Previous range  175-205  Wt Readings from Last 3 Encounters:  07/17/16 166 lb 9.6 oz (75.6 kg)  06/03/16 169 lb 15.6 oz (77.1 kg)  05/28/16 175 lb 4.3 oz (79.5 kg)    Glycemic control:   Lab Results  Component Value Date   HGBA1C 6.3 07/12/2016   HGBA1C 8.4 (H) 03/08/2016   HGBA1C 7.2 (H) 11/28/2015   Lab Results  Component Value Date   MICROALBUR 14.0 (H) 07/12/2016   LDLCALC 93 03/08/2016   CREATININE 1.32 07/12/2016    OTHER active problems: See review of systems   Lab on 07/12/2016  Component Date Value Ref Range Status  . Hgb A1c MFr Bld 07/12/2016 6.3  4.6 - 6.5 % Final   Glycemic Control Guidelines for People with Diabetes:Non Diabetic:  <6%Goal of Therapy: <7%Additional Action Suggested:  >8%   . Sodium 07/12/2016 139  135 - 145 mEq/L Final  . Potassium 07/12/2016 4.8  3.5 - 5.1 mEq/L Final  . Chloride 07/12/2016 107  96 - 112 mEq/L Final  . CO2 07/12/2016 26  19 - 32 mEq/L Final  . Glucose, Bld 07/12/2016 155* 70 - 99 mg/dL Final  . BUN 07/12/2016 21  6 - 23 mg/dL Final  . Creatinine, Ser 07/12/2016 1.32  0.40 - 1.50 mg/dL Final  . Calcium 07/12/2016 9.3  8.4 - 10.5 mg/dL Final  . GFR 07/12/2016 55.20* >60.00 mL/min Final  . TSH 07/12/2016 0.53  0.35 - 4.50 uIU/mL Final  . Microalb, Ur 07/12/2016 14.0* 0.0 - 1.9 mg/dL Final  . Creatinine,U 07/12/2016 266.6  mg/dL Final  . Microalb Creat Ratio 07/12/2016 5.3  0.0 - 30.0 mg/g Final      Allergies as of  07/17/2016   No Known Allergies     Medication List       Accurate as of 07/17/16 11:59 PM. Always use your most recent med list.          ACCU-CHEK NANO SMARTVIEW w/Device Kit   ACCU-CHEK SMARTVIEW test strip Generic drug:  glucose blood USE TO TEST BLOOD SUGAR TWICE DAILY   amLODipine 5 MG tablet Commonly known as:  NORVASC Take 1 tablet (5 mg total) by mouth daily.   aspirin 81 MG tablet Take 81 mg by mouth daily.   B-D UF III MINI PEN NEEDLES 31G X 5 MM Misc Generic drug:  Insulin Pen Needle USE ONE PER DAY WITH VICTOZA   esomeprazole 40 MG capsule Commonly known as:  NEXIUM TAKE ONE CAPSULE BY MOUTH DAILY AT NOON   ferrous sulfate 325 (65 FE) MG tablet Take 1 tablet (325 mg total) by mouth 3 (three) times daily with meals.   FLUAD 0.5 ML Susy Generic drug:  Influenza Vac A&B Surf Ant Adj   glimepiride 2 MG tablet Commonly known as:  AMARYL TAKE 2 TABLETS(4 MG) BY MOUTH DAILY BEFORE DINNER   Insulin Glargine 300 UNIT/ML Sopn Commonly known as:  TOUJEO SOLOSTAR Inject 12 Units into the skin daily.   levothyroxine 112 MCG tablet Commonly known as:  SYNTHROID Take 1 tablet (112 mcg total) by mouth daily before breakfast.   liraglutide 18 MG/3ML Sopn Commonly known as:  VICTOZA ADMINISTER 1.2 MG UNDER THE SKIN DAILY   metFORMIN 750 MG 24 hr tablet Commonly known as:  GLUCOPHAGE-XR TAKE 2 TABLETS(1500 MG) BY MOUTH DAILY WITH BREAKFAST   naproxen sodium 220 MG tablet Commonly known as:  ANAPROX Take 220 mg by mouth 2 (two) times daily as needed (pain).   simvastatin 40 MG tablet Commonly known as:  ZOCOR TAKE 1 TABLET BY MOUTH AT BEDTIME   tamsulosin 0.4 MG Caps capsule Commonly known as:  FLOMAX TAKE 1 CAPSULE BY MOUTH EVERY DAY       Allergies: No Known Allergies  Past Medical History:  Diagnosis Date  . Barrett's esophagus   . Diabetes mellitus without complication ([place])   . Diverticulosis   . Duodenitis   . GERD (gastroesophageal reflux  disease)   . Hiatal hernia     Past Surgical History:  Procedure Laterality Date  . IR GENERIC HISTORICAL  05/28/2016   IR  [place] ADDITIONAL VESSEL 05/28/2016 Aletta Edouard, MD [facility]  . IR GENERIC HISTORICAL  05/28/2016   IR ANGIOGRAM VISCERAL SELECTIVE 05/28/2016 Aletta Edouard, MD [facility]  . IR GENERIC HISTORICAL  05/28/2016   IR US GUIDE VASC ACCESS RIGHT 05/28/2016 Aletta Edouard, MD [facility]  . IR GENERIC HISTORICAL  05/28/2016   IR ANGIOGRAM VISCERAL SELECTIVE 05/28/2016 Arne Cleveland, MD [facility]  . IR GENERIC HISTORICAL  05/28/2016   IR ANGIOGRAM SELECTIVE EACH ADDITIONAL VESSEL 05/28/2016 Arne Cleveland, MD [facility]  . IR GENERIC HISTORICAL  05/28/2016   IR EMBO ART  VEN HEMORR LYMPH EXTRAV  INC GUIDE ROADMAPPING 05/28/2016 Arne Cleveland, MD [facility]  . IR GENERIC HISTORICAL  05/28/2016   IR US GUIDE VASC ACCESS RIGHT 05/28/2016 Arne Cleveland, MD [facility]  . PARTIAL COLECTOMY N/A 06/06/2016   Procedure: OPEN ASCENDING COLECTOMY;  Surgeon: Stark Klein, MD;  Location: [facility];  Service: General;  Laterality: N/A;  . VASECTOMY      Family History  Problem Relation Age of Onset  . Heart disease Mother   . Diabetes Mother   . Cancer Father     Social History:  reports that he has never smoked. He has never used smokeless tobacco. He reports that he does not drink alcohol or use drugs.    Review of Systems      RENAL insufficiency: His creatinine was transiently higher previously, Now upper normal  No history of microalbuminuria   Lab Results  Component Value Date   CREATININE 1.32 07/12/2016    HYPERTENSION: His blood pressure Is Better with being consistent on amlodipine and also starting Jardiance    BP Readings from Last 3 Encounters:  07/17/16 138/84  06/16/16 136/79  05/31/16 (!) 177/74     Most recent eye exam was 10/15       Lipids: Most recent labs as below, currently taking 40 mg Zocor       Lab  Results  Component Value Date   CHOL 158 03/08/2016   HDL 40.90 03/08/2016   LDLCALC 93 03/08/2016   LDLDIRECT 125.0 12/14/2014   TRIG 123.0 03/08/2016   CHOLHDL 4 03/08/2016                  Thyroid:  He thinks he was found to have hypothyroidism about the year 2000 on routine labs without symptoms. Apparently he did not  feel any different with taking the thyroid supplement.   His dose previously had been progressively reduced  His TSH is normal Although trending lower again He also says that he is taking his iron pill recently with the thyroid medication He has been on 125 g since 9/17 No unusual fatigue recently    Labs as follows:  Lab Results  Component Value Date   TSH 0.53 07/12/2016   TSH 1.19 03/08/2016   TSH 0.21 (L) 11/28/2015   FREET4 1.16 03/08/2016   FREET4 1.57 05/01/2015   FREET4 0.94 12/14/2014       He has been told to have hypogonadism of unclear etiology for the last few years.  For some time has not been on any medications but has been having no fatigue and has been  reluctant to go back on AndroGel   Lab Results  Component Value Date   TESTOSTERONE 174.72 (L) 12/14/2014     LABS:  Lab on 07/12/2016  Component Date Value Ref Range Status  . Hgb A1c MFr Bld 07/12/2016 6.3  4.6 -  6.5 % Final   Glycemic Control Guidelines for People with Diabetes:Non Diabetic:  <6%Goal of Therapy: <7%Additional Action Suggested:  >8%   . Sodium 07/12/2016 139  135 - 145 mEq/L Final  . Potassium 07/12/2016 4.8  3.5 - 5.1 mEq/L Final  . Chloride 07/12/2016 107  96 - 112 mEq/L Final  . CO2 07/12/2016 26  19 - 32 mEq/L Final  . Glucose, Bld 07/12/2016 155* 70 - 99 mg/dL Final  . BUN 07/12/2016 21  6 - 23 mg/dL Final  . Creatinine, Ser 07/12/2016 1.32  0.40 - 1.50 mg/dL Final  . Calcium 07/12/2016 9.3  8.4 - 10.5 mg/dL Final  . GFR 07/12/2016 55.20* >60.00 mL/min Final  . TSH 07/12/2016 0.53  0.35 - 4.50 uIU/mL Final  . Microalb, Ur 07/12/2016 14.0* 0.0 - 1.9  mg/dL Final  . Creatinine,U 07/12/2016 266.6  mg/dL Final  . Microalb Creat Ratio 07/12/2016 5.3  0.0 - 30.0 mg/g Final    Physical Examination:  BP 138/84   Pulse 67   Ht '5\' 5"'$  (1.651 m)   Wt 166 lb 9.6 oz (75.6 kg)   SpO2 97%   BMI 27.72 kg/m    ASSESSMENT:  Diabetes type 2, BMI 30 See history of present illness for detailed discussion of current diabetes management, blood sugar patterns and problems identified  His A1c is dramatically better at 6.3 and this is partly related to his significant weight loss and decrease food intake He is doing well with balanced meals and regular exercise and keeping his weight down Fasting readings are fairly good with 14 units of Toujeo However not getting postprandial monitoring done  HYPOTHYROIDISM: TSH is low normal considering his age at 0.5   PLAN:   He will continue the same dose of Toujeo but reduce the dose by 2 units if morning sugars come down further  Does need to check readings after meals and let us not there are consistently high  No change in Victoza, Amaryl or metformin  He will switch to 112 g of levothyroxine and not take the iron in the morning   Patient Instructions  Check blood sugars on waking up  4x weekly  Also check blood sugars about 2 hours after a meal and do this after different meals by rotation  Recommended blood sugar levels on waking up is 90-130 and about 2 hours after meal is 130-160  Please bring your blood sugar monitor to each visit, thank you  Take iron at dinner      Elton Heid 07/18/2016, 8:27 AM   Note: This office note was prepared with Estate agent. Any transcriptional errors that result from this process are unintentional.

## 2016-07-17 NOTE — Patient Instructions (Addendum)
Check blood sugars on waking up  4x weekly  Also check blood sugars about 2 hours after a meal and do this after different meals by rotation  Recommended blood sugar levels on waking up is 90-130 and about 2 hours after meal is 130-160  Please bring your blood sugar monitor to each visit, thank you  Take iron at dinner

## 2016-08-01 ENCOUNTER — Ambulatory Visit (INDEPENDENT_AMBULATORY_CARE_PROVIDER_SITE_OTHER): Payer: Medicare Other | Admitting: Family Medicine

## 2016-08-01 ENCOUNTER — Encounter: Payer: Self-pay | Admitting: Family Medicine

## 2016-08-01 VITALS — BP 147/77 | HR 72 | Temp 97.9°F | Resp 18 | Ht 64.53 in | Wt 162.8 lb

## 2016-08-01 DIAGNOSIS — R5381 Other malaise: Secondary | ICD-10-CM

## 2016-08-01 DIAGNOSIS — N179 Acute kidney failure, unspecified: Secondary | ICD-10-CM | POA: Diagnosis not present

## 2016-08-01 DIAGNOSIS — K922 Gastrointestinal hemorrhage, unspecified: Secondary | ICD-10-CM | POA: Diagnosis not present

## 2016-08-01 DIAGNOSIS — D62 Acute posthemorrhagic anemia: Secondary | ICD-10-CM | POA: Diagnosis not present

## 2016-08-01 NOTE — Progress Notes (Signed)
Subjective:  By signing my name below, I, Moises Blood, attest that this documentation has been prepared under the direction and in the presence of Merri Ray, MD. Electronically Signed: Moises Blood, [place]. 08/01/2016 , 3:09 PM .  Patient was seen in Room 25 .   Patient ID: Zachary Cook, male    DOB: 1933/02/02, 83 y.o.   MRN: [id number] Chief Complaint  Patient presents with  . Follow-up    Hospital follow up.  . Establish Care   HPI Zachary Cook is a 83 y.o. male Here for hospital follow up. He is a previous patient of Dr. Pauletta Browns. Patient has a history of multiple medical problems including insulin-dependent DM, GERD, and lower gastrointestinal bleeding.   Chart review of multiple other records at office visit, >25 minutes.   IDDM He is followed by Dr. Dwyane Dee for his diabetes and hypothyroidism. His last visit was on May 9th. He was continued on Toujeo with plan on decreased AM dose by 2 units if morning sugars lower. He is continued on same dose of Victoza, metformin, and amaryl.   Hypothyroidism His synthroid was changed to 136mg, follow up planned for Aug 9th.   Hospital follow up He is here for hospital follow up of lower GI bleed. He was admitted on Mar 19th through Mar 23rd, thought due to diverticular hemorrhagic shock due to lower GI bleed. He was treated with pressors, IV fluid, and blood transfusion, as well as coil embolization of distal branch of SMA on Mar 20th. He was discharged on Mar 23rd.   He was admitted again on Mar 26th through April 8th after weakness and near syncope. His hemoglobin was 5.1 with large blood bowel movement in the ER. He had refractory GI bleed, and was in operation room on Mar 29th for open right hemicolectomy, followed by post-op ileus, eventually cleared and progressed to regular diet. He was discharged home with home health therapy. At most recent hospitalization, he received 8 units of PRBC and 2 units FFP. See  previous telephone notes regarding home health PT. This was eventually declined because he felt he was doing fine at home. His next appointment with GI will be on June 13th with Dr. PHilarie Fredrickson   Patient denies any more dark tarry stools or noticing any blood in his stool. He has noticed that if he moves too fast, he becomes lightheaded. He's working on his balance. Initially, he needed a walker, but does not need it anymore. He's been able to cook, wash and drive himself without any assistance. Overall, he feels great. He was a little sore over his abdomen but is doing better right now. He is due for a follow up with Dr. BBarry Dienesin the next few weeks. He's been taking iron once a day.   Lab Results  Component Value Date   WBC 6.6 06/16/2016   HGB 8.9 (L) 06/16/2016   HCT 27.3 (L) 06/16/2016   MCV 84.3 06/16/2016   PLT 335 06/16/2016    Acute kidney injury Creatinine was 1.5 on hospital admission, improved with IV fluids. His most recent creatinine 1.32 on May 4th.   Patient Active Problem List   Diagnosis Date Noted  . Malnutrition of moderate degree 06/13/2016  . Ileus following gastrointestinal surgery 06/12/2016  . GI bleed 06/03/2016  . Syncope   . Diverticulosis of colon with hemorrhage   . Hematochezia   . Lower gastrointestinal bleeding   . BRBPR (bright red blood per rectum) 05/27/2016  .  Acute blood loss anemia 05/27/2016  . Hypotension due to blood loss 05/27/2016  . AKI (acute kidney injury) ([place]) 05/27/2016  . Hyperkalemia 05/27/2016  . Adult onset hypothyroidism 12/20/2013  . Type II diabetes mellitus, uncontrolled ([place]) 12/20/2013  . Hypogonadism in male 12/02/2013  . Type 2 diabetes mellitus ([place]) 05/09/2011  . Hypertension 05/09/2011  . GERD (gastroesophageal reflux disease) 05/09/2011  . Hypogonadism male 05/09/2011   Past Medical History:  Diagnosis Date  . Barrett's esophagus   . Diabetes mellitus without complication ([place])   . Diverticulosis   . Duodenitis     . GERD (gastroesophageal reflux disease)   . Hiatal hernia    Past Surgical History:  Procedure Laterality Date  . IR GENERIC HISTORICAL  05/28/2016   IR ANGIOGRAM SELECTIVE EACH ADDITIONAL VESSEL 05/28/2016 Aletta Edouard, MD [facility]  . IR GENERIC HISTORICAL  05/28/2016   IR ANGIOGRAM VISCERAL SELECTIVE 05/28/2016 Aletta Edouard, MD [facility]  . IR GENERIC HISTORICAL  05/28/2016   IR US GUIDE VASC ACCESS RIGHT 05/28/2016 Aletta Edouard, MD [facility]  . IR GENERIC HISTORICAL  05/28/2016   IR ANGIOGRAM VISCERAL SELECTIVE 05/28/2016 Arne Cleveland, MD [facility]  . IR GENERIC HISTORICAL  05/28/2016   IR ANGIOGRAM SELECTIVE EACH ADDITIONAL VESSEL 05/28/2016 Arne Cleveland, MD [facility]  . IR GENERIC HISTORICAL  05/28/2016   IR EMBO ART  VEN HEMORR LYMPH EXTRAV  INC GUIDE ROADMAPPING 05/28/2016 Arne Cleveland, MD [facility]  . IR GENERIC HISTORICAL  05/28/2016   IR US GUIDE VASC ACCESS RIGHT 05/28/2016 Arne Cleveland, MD [facility]  . PARTIAL COLECTOMY N/A 06/06/2016   Procedure: OPEN ASCENDING COLECTOMY;  Surgeon: Stark Klein, MD;  Location: [facility];  Service: General;  Laterality: N/A;  . VASECTOMY     No Known Allergies Prior to Admission medications   Medication Sig Start Date End Date Taking? Authorizing Provider  ACCU-CHEK SMARTVIEW test strip USE TO TEST BLOOD SUGAR TWICE DAILY 07/15/16   Elayne Snare, MD  amLODipine (NORVASC) 5 MG tablet Take 1 tablet (5 mg total) by mouth daily. 06/16/16   Caren Griffins, MD  aspirin 81 MG tablet Take 81 mg by mouth daily.    [provider]  B-D UF III MINI PEN NEEDLES 31G X 5 MM MISC USE ONE PER DAY WITH VICTOZA 11/22/15   Elayne Snare, MD  Blood Glucose Monitoring Suppl (ACCU-CHEK NANO SMARTVIEW) W/DEVICE KIT  12/02/13   [provider]  esomeprazole ([place]) 40 MG capsule TAKE ONE CAPSULE BY MOUTH DAILY AT NOON Patient taking differently: TAKE ONE CAPSULE BY MOUTH DAILY 04/14/16   [name], MD  ferrous  sulfate 325 (65 FE) MG tablet Take 1 tablet (325 mg total) by mouth 3 (three) times daily with meals. Patient taking differently: Take 325 mg by mouth daily.  05/31/16   Patrecia Pour, Christean Grief, MD  FLUAD 0.5 ML SUSY  11/30/15   [provider]  glimepiride (AMARYL) 2 MG tablet TAKE 2 TABLETS(4 MG) BY MOUTH DAILY BEFORE DINNER Patient taking differently: take '2mg'$  by mouth with dinner 04/05/16   Elayne Snare, MD  Insulin Glargine (TOUJEO SOLOSTAR) 300 UNIT/ML SOPN Inject 12 Units into the skin daily. Patient taking differently: Inject 16 Units into the skin daily.  04/10/16   Elayne Snare, MD  levothyroxine (SYNTHROID) 112 MCG tablet Take 1 tablet (112 mcg total) by mouth daily before breakfast. 07/17/16   Elayne Snare, MD  liraglutide (VICTOZA) 18 MG/3ML SOPN ADMINISTER 1.2 MG UNDER THE SKIN  DAILY 05/07/16   Elayne Snare, MD  metFORMIN (GLUCOPHAGE-XR) 750 MG 24 hr tablet TAKE 2 TABLETS(1500 MG) BY MOUTH DAILY WITH BREAKFAST 07/15/16   Elayne Snare, MD  naproxen sodium (ANAPROX) 220 MG tablet Take 220 mg by mouth 2 (two) times daily as needed (pain).    [provider]  simvastatin (ZOCOR) 40 MG tablet TAKE 1 TABLET BY MOUTH AT BEDTIME 07/16/16   [name], MD  tamsulosin (FLOMAX) 0.4 MG CAPS capsule TAKE 1 CAPSULE BY MOUTH EVERY DAY 07/06/16   [name], MD   Social History   Social History  . Marital status: Married    Spouse name: N/A  . Number of children: N/A  . Years of education: N/A   Occupational History  . Not on file.   Social History Main Topics  . Smoking status: Never Smoker  . Smokeless tobacco: Never Used  . Alcohol use No  . Drug use: No  . Sexual activity: Not on file   Other Topics Concern  . Not on file   Social History Narrative  . No narrative on file   Review of Systems  Constitutional: Negative for fatigue and unexpected weight change.  Eyes: Negative for visual disturbance.  Respiratory: Negative for cough, chest tightness and shortness of  breath.   Cardiovascular: Negative for chest pain, palpitations and leg swelling.  Gastrointestinal: Negative for abdominal distention, abdominal pain, anal bleeding, blood in stool and nausea.  Neurological: Negative for dizziness, light-headedness and headaches.       Objective:   Physical Exam  Constitutional: He is oriented to person, place, and time. He appears well-developed and well-nourished.  HENT:  Head: Normocephalic and atraumatic.  Eyes: EOM are normal. Pupils are equal, round, and reactive to light.  Neck: No JVD present. Carotid bruit is not present.  Cardiovascular: Normal rate, regular rhythm and normal heart sounds.   No murmur heard. Pulmonary/Chest: Effort normal and breath sounds normal. He has no rales.  Abdominal: Soft. He exhibits no distension. There is no tenderness.  Midline abdominal scar embolus, appears to be healing well   Musculoskeletal: He exhibits no edema.  Neurological: He is alert and oriented to person, place, and time.  Skin: Skin is warm and dry.  Psychiatric: He has a normal mood and affect.  Vitals reviewed.   Vitals:   08/01/16 1433  BP: (!) 147/77  Pulse: 72  Resp: 18  Temp: 97.9 F (36.6 C)  TempSrc: Oral  SpO2: 96%  Weight: 162 lb 12.8 oz (73.8 kg)  Height: 5' 4.53" (1.639 m)      Assessment & Plan:  Zachary Cook is a 83 y.o. male Lower gastrointestinal bleed - Plan: Care order/instruction: Acute blood loss anemia - Plan: CBC  - As above, history of diverticular bleed ultimately requiring hemicolectomy. Has continued to improve since last hospitalization. Tolerating iron, PPI, as planned follow-up with gastroenterology and general surgery. Energy level increasing slowly, denies dark stools or blood in stool.  -Recheck CBC for improvement. Continue follow-up with specialists.  AKI (acute kidney injury) ([place]) - Plan: Basic metabolic panel  -Creatinine improved on last evaluation from hospitalization, but not down to his  typical range. Repeat BMP.   Physical deconditioning  - Declines need for home health physical therapy at this time, is continuing to increase activity, ADLs, IADLs intact. Not requiring assistive devices at this point.  Recheck in 3 months No orders of the defined types were placed in this encounter.  Patient  Instructions    Continue follow-up with general surgery and gastric neurology as planned. I will check your blood count again today to make sure the anemia is improving. I will also recheck your kidney function test that should also be improving since your hospitalization. Follow-up with me in the next 3-6 months to discuss your other medications, let me know if there are any questions sooner.  If you have difficulty with increasing your strength or energy level in the next month or 2, let me know and we have the option of physical therapy evaluation.    IF you received an x-ray today, you will receive an invoice from [facility]. Please contact [facility] at [phone] with questions or concerns regarding your invoice.   IF you received labwork today, you will receive an invoice from [facility]. Please contact [facility] at [phone] with questions or concerns regarding your invoice.   Our billing staff will not be able to assist you with questions regarding bills from these companies.  You will be contacted with the lab results as soon as they are available. The fastest way to get your results is to activate your My Chart account. Instructions are located on the last page of this paperwork. If you have not heard from [place] regarding the results in 2 weeks, please contact this office.       I personally performed the services described in this documentation, which was scribed in my presence. The recorded information has been reviewed and considered for accuracy and completeness, addended by me as needed, and agree with information above.  Signed,   Merri Ray, MD [facility].  08/01/16 3:18 PM

## 2016-08-01 NOTE — Patient Instructions (Addendum)
  Continue follow-up with general surgery and gastric neurology as planned. I will check your blood count again today to make sure the anemia is improving. I will also recheck your kidney function test that should also be improving since your hospitalization. Follow-up with me in the next 3-6 months to discuss your other medications, let me know if there are any questions sooner.  If you have difficulty with increasing your strength or energy level in the next month or 2, let me know and we have the option of physical therapy evaluation.    IF you received an x-ray today, you will receive an invoice from [facility]. Please contact [facility] at [phone] with questions or concerns regarding your invoice.   IF you received labwork today, you will receive an invoice from [facility]. Please contact [facility] at [phone] with questions or concerns regarding your invoice.   Our billing staff will not be able to assist you with questions regarding bills from these companies.  You will be contacted with the lab results as soon as they are available. The fastest way to get your results is to activate your My Chart account. Instructions are located on the last page of this paperwork. If you have not heard from [place] regarding the results in 2 weeks, please contact this office.

## 2016-08-02 LAB — BASIC METABOLIC PANEL
BUN/Creatinine Ratio: 19 (ref 10–24)
BUN: 21 mg/dL (ref 8–27)
CHLORIDE: 106 mmol/L (ref 96–106)
CO2: 23 mmol/L (ref 18–29)
CREATININE: 1.11 mg/dL (ref 0.76–1.27)
Calcium: 9.7 mg/dL (ref 8.6–10.2)
GFR calc Af Amer: 71 mL/min/{1.73_m2} (ref >59)
GFR calc non Af Amer: 62 mL/min/{1.73_m2} (ref >59)
GLUCOSE: 75 mg/dL (ref 65–99)
Potassium: 5.1 mmol/L (ref 3.5–5.2)
SODIUM: 145 mmol/L — AB (ref 134–144)

## 2016-08-02 LAB — CBC
HEMOGLOBIN: 11.6 g/dL — AB (ref 13.0–17.7)
Hematocrit: 35.8 % — ABNORMAL LOW (ref 37.5–51.0)
MCH: 27.8 pg (ref 26.6–33.0)
MCHC: 32.4 g/dL (ref 31.5–35.7)
MCV: 86 fL (ref 79–97)
PLATELETS: 228 10*3/uL (ref 150–379)
RBC: 4.18 x10E6/uL (ref 4.14–5.80)
RDW: 15.3 % (ref 12.3–15.4)
WBC: 8.4 10*3/uL (ref 3.4–10.8)

## 2016-08-12 ENCOUNTER — Other Ambulatory Visit: Payer: Self-pay | Admitting: Endocrinology

## 2016-08-12 ENCOUNTER — Other Ambulatory Visit: Payer: Self-pay | Admitting: Family Medicine

## 2016-08-12 DIAGNOSIS — E785 Hyperlipidemia, unspecified: Secondary | ICD-10-CM

## 2016-08-21 ENCOUNTER — Ambulatory Visit (INDEPENDENT_AMBULATORY_CARE_PROVIDER_SITE_OTHER): Payer: Medicare Other | Admitting: Internal Medicine

## 2016-08-21 ENCOUNTER — Encounter: Payer: Self-pay | Admitting: Internal Medicine

## 2016-08-21 VITALS — BP 128/80 | HR 71 | Ht 65.0 in | Wt 170.0 lb

## 2016-08-21 DIAGNOSIS — K5791 Diverticulosis of intestine, part unspecified, without perforation or abscess with bleeding: Secondary | ICD-10-CM

## 2016-08-21 DIAGNOSIS — K2270 Barrett's esophagus without dysplasia: Secondary | ICD-10-CM

## 2016-08-21 NOTE — Patient Instructions (Signed)
Please follow up with Dr Hilarie Fredrickson in 1 year.  Continue Nexium 40 mg daily  If you are age 81 or older, your body mass index should be between 23-30. Your Body mass index is 28.29 kg/m. If this is out of the aforementioned range listed, please consider follow up with your Primary Care Provider.  If you are age 86 or younger, your body mass index should be between 19-25. Your Body mass index is 28.29 kg/m. If this is out of the aformentioned range listed, please consider follow up with your Primary Care Provider.

## 2016-08-21 NOTE — Progress Notes (Signed)
Patient ID: Zachary Cook, male   DOB: October 21, 1932, 83 y.o.   MRN: [id number] HPI: Zachary Cook is an 83 year old male with a recent hospitalization for significant lower GI bleed from diverticulosis now status post right hemicolectomy, history of Barrett's esophagus with reflux disease, hiatal hernia who is seen in consult at the request of Dr. Carlota Raspberry for hospital follow-up and given his GI history.  He reports that his postoperative course was complicated by ileus and prolonged hospitalization. He is now recovered and feeling well. He denies GI complaint. He's not had any further GI bleeding including one per rectum or melena. Bowel movements have become slightly more normal than before surgery. He's going at least once daily without diarrhea or constipation. He has no abdominal pain. No reflux symptoms such as heartburn or dysphagia. He is taking Nexium 40 mg daily. No nausea or vomiting. Good appetite. Stable weight.  His last endoscopic procedures were in June 2011 with Dr. Sharlett Iles. His Barrett's has been stable without dysplasia. He does have a known hiatal hernia. There was duodenitis at his last upper endoscopy. Colonoscopy showed severe diverticulosis but no polyps or cancers.  Past Medical History:  Diagnosis Date  . Barrett's esophagus   . Diabetes mellitus without complication ([place])   . Diverticulosis   . Duodenitis   . GERD (gastroesophageal reflux disease)   . Hiatal hernia     Past Surgical History:  Procedure Laterality Date  . IR GENERIC HISTORICAL  05/28/2016   IR ANGIOGRAM SELECTIVE EACH ADDITIONAL VESSEL 05/28/2016 Aletta Edouard, MD [facility]  . IR GENERIC HISTORICAL  05/28/2016   IR ANGIOGRAM VISCERAL SELECTIVE 05/28/2016 Aletta Edouard, MD [facility]  . IR GENERIC HISTORICAL  05/28/2016   IR US GUIDE VASC ACCESS RIGHT 05/28/2016 Aletta Edouard, MD [facility]  . IR GENERIC HISTORICAL  05/28/2016   IR ANGIOGRAM VISCERAL SELECTIVE 05/28/2016 Arne Cleveland, MD  [facility]  . IR GENERIC HISTORICAL  05/28/2016   IR ANGIOGRAM SELECTIVE EACH ADDITIONAL VESSEL 05/28/2016 Arne Cleveland, MD [facility]  . IR GENERIC HISTORICAL  05/28/2016   IR EMBO ART  VEN HEMORR LYMPH EXTRAV  INC GUIDE ROADMAPPING 05/28/2016 Arne Cleveland, MD [facility]  . IR GENERIC HISTORICAL  05/28/2016   IR US GUIDE VASC ACCESS RIGHT 05/28/2016 Arne Cleveland, MD [facility]  . PARTIAL COLECTOMY N/A 06/06/2016   Procedure: OPEN ASCENDING COLECTOMY;  Surgeon: Stark Klein, MD;  Location: [facility];  Service: General;  Laterality: N/A;  . VASECTOMY      Outpatient Medications Prior to Visit  Medication Sig Dispense Refill  . ACCU-CHEK SMARTVIEW test strip USE TO TEST BLOOD SUGAR TWICE DAILY 200 each 0  . amLODipine (NORVASC) 5 MG tablet Take 1 tablet (5 mg total) by mouth daily. 30 tablet 1  . aspirin 81 MG tablet Take 81 mg by mouth daily.    . B-D UF III MINI PEN NEEDLES 31G X 5 MM MISC USE ONE PER DAY WITH VICTOZA 100 each 5  . Blood Glucose Monitoring Suppl (ACCU-CHEK NANO SMARTVIEW) W/DEVICE KIT     . esomeprazole (NEXIUM) 40 MG capsule TAKE ONE CAPSULE BY MOUTH DAILY AT NOON (Patient taking differently: TAKE ONE CAPSULE BY MOUTH DAILY) 90 capsule 0  . ferrous sulfate 325 (65 FE) MG tablet Take 1 tablet (325 mg total) by mouth 3 (three) times daily with meals. (Patient taking differently: Take 325 mg by mouth daily. ) 90 tablet 0  . FLUAD 0.5 ML SUSY     .  glimepiride (AMARYL) 2 MG tablet TAKE 2 TABLETS(4 MG) BY MOUTH DAILY BEFORE DINNER 60 tablet 0  . Insulin Glargine (TOUJEO SOLOSTAR) 300 UNIT/ML SOPN Inject 12 Units into the skin daily. (Patient taking differently: Inject 16 Units into the skin daily. ) 3 pen 3  . levothyroxine (SYNTHROID) 112 MCG tablet Take 1 tablet (112 mcg total) by mouth daily before breakfast. 30 tablet 5  . liraglutide (VICTOZA) 18 MG/3ML SOPN ADMINISTER 1.2 MG UNDER THE SKIN DAILY 6 mL 0  . metFORMIN (GLUCOPHAGE-XR) 750 MG 24 hr tablet TAKE 2  TABLETS(1500 MG) BY MOUTH DAILY WITH BREAKFAST 60 tablet 0  . naproxen sodium (ANAPROX) 220 MG tablet Take 220 mg by mouth 2 (two) times daily as needed (pain).    . simvastatin (ZOCOR) 40 MG tablet TAKE 1 TABLET BY MOUTH AT BEDTIME 30 tablet 2  . tamsulosin (FLOMAX) 0.4 MG CAPS capsule TAKE 1 CAPSULE BY MOUTH EVERY DAY 90 capsule 0  . glimepiride (AMARYL) 2 MG tablet TAKE 2 TABLETS(4 MG) BY MOUTH DAILY BEFORE DINNER (Patient taking differently: take '2mg'$  by mouth with dinner) 60 tablet 0  . metFORMIN (GLUCOPHAGE-XR) 750 MG 24 hr tablet TAKE 2 TABLETS(1500 MG) BY MOUTH DAILY WITH BREAKFAST 60 tablet 0   No facility-administered medications prior to visit.     No Known Allergies  Family History  Problem Relation Age of Onset  . Heart disease Mother   . Diabetes Mother   . Cancer Father     Social History  Substance Use Topics  . Smoking status: Never Smoker  . Smokeless tobacco: Never Used  . Alcohol use No    ROS: As per history of present illness, otherwise negative  BP 128/80   Pulse 71   Ht '5\' 5"'$  (1.651 m)   Wt 170 lb (77.1 kg)   BMI 28.29 kg/m  Constitutional: Well-developed and well-nourished. No distress. HEENT: Normocephalic and atraumatic. Oropharynx is clear and moist. Conjunctivae are normal.  No scleral icterus. Neck: Neck supple. Trachea midline. Cardiovascular: Normal rate, regular rhythm and intact distal pulses. No M/R/G Pulmonary/chest: Effort normal and breath sounds normal. No wheezing, rales or rhonchi. Abdominal: Soft, nontender, nondistended. Bowel sounds active throughout. There are no masses palpable. No hepatosplenomegaly. Well-healed vertical abdominal incision  Extremities: no clubbing, cyanosis, or edema Neurological: Alert and oriented to person place and time. Skin: Skin is warm and dry.  Psychiatric: Normal mood and affect. Behavior is normal.  RELEVANT LABS AND IMAGING: CBC    Component Value Date/Time   WBC 8.4 08/01/2016 1528   WBC  6.6 06/16/2016 0431   RBC 4.18 08/01/2016 1528   RBC 3.24 (L) 06/16/2016 0431   HGB 11.6 (L) 08/01/2016 1528   HCT 35.8 (L) 08/01/2016 1528   PLT 228 08/01/2016 1528   MCV 86 08/01/2016 1528   MCH 27.8 08/01/2016 1528   MCH 27.5 06/16/2016 0431   MCHC 32.4 08/01/2016 1528   MCHC 32.6 06/16/2016 0431   RDW 15.3 08/01/2016 1528   LYMPHSABS 1.8 06/09/2016 0450   MONOABS 1.0 06/09/2016 0450   EOSABS 0.1 06/09/2016 0450   BASOSABS 0.0 06/09/2016 0450    CMP     Component Value Date/Time   NA 145 (H) 08/01/2016 1528   K 5.1 08/01/2016 1528   CL 106 08/01/2016 1528   CO2 23 08/01/2016 1528   GLUCOSE 75 08/01/2016 1528   GLUCOSE 155 (H) 07/12/2016 1409   BUN 21 08/01/2016 1528   CREATININE 1.11 08/01/2016 1528   CREATININE  1.35 12/02/2013 1106   CALCIUM 9.7 08/01/2016 1528   PROT 5.1 (L) 06/10/2016 0500   ALBUMIN 1.8 (L) 06/10/2016 0500   AST 48 (H) 06/10/2016 0500   ALT 26 06/10/2016 0500   ALKPHOS 51 06/10/2016 0500   BILITOT 0.5 06/10/2016 0500   GFRNONAA 62 08/01/2016 1528   GFRNONAA 50 (L) 12/02/2013 1106   GFRAA 71 08/01/2016 1528   GFRAA 57 (L) 12/02/2013 1106    ASSESSMENT/PLAN: 83 year old male with a recent hospitalization for significant lower GI bleed from diverticulosis now status post right hemicolectomy, history of Barrett's esophagus with reflux disease, hiatal hernia who is seen in consult at the request of Dr. Carlota Raspberry for hospital follow-up and given his GI history.  1. Diverticular hemorrhage status post right hemicolectomy -- prolonged hospital stay with postoperative ileus but he has recovered well. There is been no evidence of ongoing or recurrent GI bleeding. His hemoglobin has improved and is near normal. He is without symptoms. He does not have a history of colon polyps. Given his age of 55 years and no concern for ongoing GI blood loss we are not planning to repeat a screening/surveillance colonoscopy. We discussed this at length today based on his  overall condition, age and history. It is his preference to not pursue further screening procedures such as colonoscopy. This is reasonable to me. Should he develop recurrent bleeding or concerning symptoms he is asked to contact me for follow-up.  2. GERD with history of Barrett's esophagus -- long-standing history of Barrett's esophagus on long-standing PPI therapy. No history of dysplasia. No alarm symptoms. We discussed surveillance endoscopy versus discontinuation of Barrett's surveillance. He does not feel that he would want to pursue aggressive treatments even if dysplasia were found. For this reason we will discontinue surveillance endoscopy. This is also reasonable. I asked that he continue Nexium 40 mg daily and notify me if he develops upper GI complaint. He voices understanding and is happy with this plan  1 year follow-up, sooner if needed     [id number], Ranell Patrick, [address] [place], [place] [address]

## 2016-09-12 ENCOUNTER — Other Ambulatory Visit: Payer: Self-pay | Admitting: Endocrinology

## 2016-09-27 ENCOUNTER — Other Ambulatory Visit: Payer: Self-pay | Admitting: Family Medicine

## 2016-09-27 DIAGNOSIS — K2270 Barrett's esophagus without dysplasia: Secondary | ICD-10-CM

## 2016-10-05 ENCOUNTER — Other Ambulatory Visit: Payer: Self-pay | Admitting: Family Medicine

## 2016-10-10 ENCOUNTER — Other Ambulatory Visit: Payer: Self-pay | Admitting: Endocrinology

## 2016-10-11 ENCOUNTER — Other Ambulatory Visit: Payer: Self-pay | Admitting: Endocrinology

## 2016-10-14 ENCOUNTER — Other Ambulatory Visit (INDEPENDENT_AMBULATORY_CARE_PROVIDER_SITE_OTHER): Payer: Medicare Other

## 2016-10-14 DIAGNOSIS — E039 Hypothyroidism, unspecified: Secondary | ICD-10-CM | POA: Diagnosis not present

## 2016-10-14 DIAGNOSIS — E1165 Type 2 diabetes mellitus with hyperglycemia: Secondary | ICD-10-CM

## 2016-10-14 LAB — BASIC METABOLIC PANEL
BUN: 26 mg/dL — ABNORMAL HIGH (ref 6–23)
CALCIUM: 9.5 mg/dL (ref 8.4–10.5)
CO2: 27 mEq/L (ref 19–32)
Chloride: 106 mEq/L (ref 96–112)
Creatinine, Ser: 1.44 mg/dL (ref 0.40–1.50)
GFR: 49.89 mL/min — AB (ref >60.00)
GLUCOSE: 264 mg/dL — AB (ref 70–99)
Potassium: 4.5 mEq/L (ref 3.5–5.1)
SODIUM: 140 meq/L (ref 135–145)

## 2016-10-14 LAB — HEMOGLOBIN A1C: HEMOGLOBIN A1C: 6.5 % (ref 4.6–6.5)

## 2016-10-14 LAB — T4, FREE: FREE T4: 0.58 ng/dL — AB (ref 0.60–1.60)

## 2016-10-14 LAB — TSH: TSH: 27.69 u[IU]/mL — ABNORMAL HIGH (ref 0.35–4.50)

## 2016-10-17 ENCOUNTER — Encounter: Payer: Self-pay | Admitting: Endocrinology

## 2016-10-17 ENCOUNTER — Other Ambulatory Visit: Payer: Self-pay | Admitting: Endocrinology

## 2016-10-17 ENCOUNTER — Ambulatory Visit (INDEPENDENT_AMBULATORY_CARE_PROVIDER_SITE_OTHER): Payer: Medicare Other | Admitting: Endocrinology

## 2016-10-17 VITALS — BP 140/92 | HR 63 | Ht 65.0 in | Wt 179.8 lb

## 2016-10-17 DIAGNOSIS — E063 Autoimmune thyroiditis: Secondary | ICD-10-CM

## 2016-10-17 DIAGNOSIS — Z794 Long term (current) use of insulin: Secondary | ICD-10-CM | POA: Diagnosis not present

## 2016-10-17 DIAGNOSIS — E1165 Type 2 diabetes mellitus with hyperglycemia: Secondary | ICD-10-CM

## 2016-10-17 NOTE — Patient Instructions (Addendum)
Use diabetic boost  Take Iron in pm not in am  Go back to 125 ug thyroid pill  Check blood sugars on waking up  3/7 days  Also check blood sugars about 2 hours after a meal and do this after different meals by rotation  Recommended blood sugar levels on waking up is 90-130 and about 2 hours after meal is 130-160  Please bring your blood sugar monitor to each visit, thank you

## 2016-10-17 NOTE — Progress Notes (Signed)
Patient ID: Zachary Cook, male   DOB: [birth year], 83 y.o.   MRN: [id number]           Reason for Appointment: Follow-Up    History of Present Illness:          Diagnosis: Type 2 diabetes mellitus, date of diagnosis: 2005       Past history: He was started on metformin when he was found to have diabetes on routine lab work. Details of this are not available He thinks his blood sugars had been fairly well controlled for several years with metformin alone but no records are available He has had a couple of different physicians and has not always been followed consistently for his diabetes  Blood sugars had been significantly higher in the 4 months prior to his consultation in 10/15 and his A1c had gone up to 12% with his regimen of glipizide and metformin  He was given a trial of Invokana but this did not improve his blood sugars He has been on Victoza since 10/15 and this was restarted in 1/16, initially had difficulty with nausea  Recent history:    INSULIN regimen: Toujeo 16 units daily  Non-insulin hypoglycemic drugs : Metformin ER 750 mg, 2 tablets daily, Amaryl 2 mg at supper, Victoza 1.2 mg daily  Since his blood sugars were high especially fasting he was started on Toujeo in 1/18  His A1c is still excellent at 6.5 and improved after starting basal insulin  Current blood sugar patterns and problems identified:  He has checked his blood sugars only before his first meal and not after meals as directed  Not clear if the blood sugars are variable in the mornings because of inconsistent diet at night  He is drinking boost sometimes in the mornings and this may have made his blood sugar go up to 264 on his labs  His weight has continued to come back up  He is still not feeling well enough to exercise and only doing a little walking       Glucose monitoring:  done 1.4 times a day         Glucometer: Accu-Chek       Blood Glucose readings  Recently readings from  download   RECENT range 97-1 83 with average 145 with most readings between 9 AM and 12 noon  Self-care: The diet that the patient has been following is: None, usually low fat      Meals: 2-3 meals per day, dinner 6-7 . Breakfast is usually egg/Boost and no fried food          Exercise: walking a little  Dietician visit, most recent: None.               Weight history: Previous range  175-205  Wt Readings from Last 3 Encounters:  10/17/16 179 lb 12.8 oz (81.6 kg)  08/21/16 170 lb (77.1 kg)  08/01/16 162 lb 12.8 oz (73.8 kg)    Glycemic control:   Lab Results  Component Value Date   HGBA1C 6.5 10/14/2016   HGBA1C 6.3 07/12/2016   HGBA1C 8.4 (H) 03/08/2016   Lab Results  Component Value Date   MICROALBUR 14.0 (H) 07/12/2016   LDLCALC 93 03/08/2016   CREATININE 1.44 10/14/2016    OTHER active problems: See review of systems   Lab on 10/14/2016  Component Date Value Ref Range Status  . Hgb A1c MFr Bld 10/14/2016 6.5  4.6 - 6.5 % Final   Glycemic Control  Guidelines for People with Diabetes:Non Diabetic:  <6%Goal of Therapy: <7%Additional Action Suggested:  >8%   . Sodium 10/14/2016 140  135 - 145 mEq/L Final  . Potassium 10/14/2016 4.5  3.5 - 5.1 mEq/L Final  . Chloride 10/14/2016 106  96 - 112 mEq/L Final  . CO2 10/14/2016 27  19 - 32 mEq/L Final  . Glucose, Bld 10/14/2016 264* 70 - 99 mg/dL Final  . BUN 74/25/3272 26* 6 - 23 mg/dL Final  . Creatinine, Ser 10/14/2016 1.44  0.40 - 1.50 mg/dL Final  . Calcium 84/88/5232 9.5  8.4 - 10.5 mg/dL Final  . GFR 74/90/1099 49.89* >60.00 mL/min Final  . TSH 10/14/2016 27.69* 0.35 - 4.50 uIU/mL Final  . Free T4 10/14/2016 0.58* 0.60 - 1.60 ng/dL Final   Comment: Specimens from patients who are undergoing biotin therapy and /or ingesting biotin supplements may contain high levels of biotin.  The higher biotin concentration in these specimens interferes with this Free T4 assay.  Specimens that contain high levels  of biotin may  cause false high results for this Free T4 assay.  Please interpret results in light of the total clinical presentation of the patient.        Allergies as of 10/17/2016   No Known Allergies     Medication List       Accurate as of 10/17/16 11:59 PM. Always use your most recent med list.          ACCU-CHEK NANO SMARTVIEW w/Device Kit   ACCU-CHEK SMARTVIEW test strip Generic drug:  glucose blood USE TO TEST BLOOD SUGAR TWICE DAILY   amLODipine 5 MG tablet Commonly known as:  NORVASC Take 1 tablet (5 mg total) by mouth daily.   aspirin 81 MG tablet Take 81 mg by mouth daily.   B-D UF III MINI PEN NEEDLES 31G X 5 MM Misc Generic drug:  Insulin Pen Needle USE ONE PER DAY WITH VICTOZA   esomeprazole 40 MG capsule Commonly known as:  NEXIUM TAKE ONE CAPSULE BY MOUTH DAILY AT NOON   ferrous sulfate 325 (65 FE) MG tablet Take 1 tablet (325 mg total) by mouth 3 (three) times daily with meals.   FLUAD 0.5 ML Susy Generic drug:  Influenza Vac A&B Surf Ant Adj   glimepiride 2 MG tablet Commonly known as:  AMARYL TAKE 2 TABLETS(4 MG) BY MOUTH DAILY BEFORE DINNER   Insulin Glargine 300 UNIT/ML Sopn Commonly known as:  TOUJEO SOLOSTAR Inject 12 Units into the skin daily.   levothyroxine 112 MCG tablet Commonly known as:  SYNTHROID Take 1 tablet (112 mcg total) by mouth daily before breakfast.   metFORMIN 750 MG 24 hr tablet Commonly known as:  GLUCOPHAGE-XR TAKE 2 TABLETS(1500 MG) BY MOUTH DAILY WITH BREAKFAST   naproxen sodium 220 MG tablet Commonly known as:  ANAPROX Take 220 mg by mouth 2 (two) times daily as needed (pain).   simvastatin 40 MG tablet Commonly known as:  ZOCOR TAKE 1 TABLET BY MOUTH AT BEDTIME   tamsulosin 0.4 MG Caps capsule Commonly known as:  FLOMAX TAKE 1 CAPSULE BY MOUTH EVERY DAY   VICTOZA 18 MG/3ML Sopn Generic drug:  liraglutide ADMINISTER 1.2 MG UNDER THE SKIN DAILY       Allergies: No Known Allergies  Past Medical History:   Diagnosis Date  . Barrett's esophagus   . Diabetes mellitus without complication ([facility])   . Diverticulosis   . Duodenitis   . GERD (gastroesophageal reflux disease)   .  Hiatal hernia     Past Surgical History:  Procedure Laterality Date  . IR GENERIC HISTORICAL  05/28/2016   IR ANGIOGRAM SELECTIVE EACH ADDITIONAL VESSEL 05/28/2016 Aletta Edouard, MD [facility]  . IR GENERIC HISTORICAL  05/28/2016   IR ANGIOGRAM VISCERAL SELECTIVE 05/28/2016 Aletta Edouard, MD [facility]  . IR GENERIC HISTORICAL  05/28/2016   IR US GUIDE VASC ACCESS RIGHT 05/28/2016 Aletta Edouard, MD [facility]  . IR GENERIC HISTORICAL  05/28/2016   IR ANGIOGRAM VISCERAL SELECTIVE 05/28/2016 Arne Cleveland, MD [facility]  . IR GENERIC HISTORICAL  05/28/2016   IR ANGIOGRAM SELECTIVE EACH ADDITIONAL VESSEL 05/28/2016 Arne Cleveland, MD [facility]  . IR GENERIC HISTORICAL  05/28/2016   IR EMBO ART  VEN HEMORR LYMPH EXTRAV  INC GUIDE ROADMAPPING 05/28/2016 Arne Cleveland, MD [facility]  . IR GENERIC HISTORICAL  05/28/2016   IR US GUIDE VASC ACCESS RIGHT 05/28/2016 Arne Cleveland, MD [facility]  . PARTIAL COLECTOMY N/A 06/06/2016   Procedure: OPEN ASCENDING COLECTOMY;  Surgeon: Stark Klein, MD;  Location: [facility];  Service: General;  Laterality: N/A;  . VASECTOMY      Family History  Problem Relation Age of Onset  . Heart disease Mother   . Diabetes Mother   . Cancer Father     Social History:  reports that he has never smoked. He has never used smokeless tobacco. He reports that he does not drink alcohol or use drugs.    Review of Systems     HYPERTENSION: His blood pressure Is       BP Readings from Last 3 Encounters:  10/17/16 (!) 140/92  08/21/16 128/80  08/01/16 (!) 147/77     Most recent eye exam was 10/15       Lipids: Most recent labs as below, currently taking 40 mg Zocor       Lab Results  Component Value Date   CHOL 158 03/08/2016   HDL 40.90 03/08/2016   LDLCALC 93  03/08/2016   LDLDIRECT 125.0 12/14/2014   TRIG 123.0 03/08/2016   CHOLHDL 4 03/08/2016                  Thyroid:  He thinks he was found to have hypothyroidism about the year 2000 on routine labs without symptoms. Apparently he did not  feel any different with taking the thyroid supplement.   His dose previously had been progressively reduced   He also says that he is taking his iron pill  with the thyroid medication Because of low-normal TSH in 5/18 his dose was reduced down to 112 g  Despite instructions he is still taking the iron with the thyroid medication He complains of cold intolerance and also some fatigue Weight has increased   Labs as follows:  Lab Results  Component Value Date   TSH 27.69 (H) 10/14/2016   TSH 0.53 07/12/2016   TSH 1.19 03/08/2016   FREET4 0.58 (L) 10/14/2016   FREET4 1.16 03/08/2016   FREET4 1.57 05/01/2015       He has been told to have hypogonadism of unclear etiology for the last few years.  For some time has not been on any medications but has been having no fatigue and has been  reluctant to go back on AndroGel   Lab Results  Component Value Date   TESTOSTERONE 174.72 (L) 12/14/2014     LABS:  Lab on 10/14/2016  Component Date Value Ref Range Status  . Hgb A1c MFr Bld 10/14/2016  6.5  4.6 - 6.5 % Final   Glycemic Control Guidelines for People with Diabetes:Non Diabetic:  <6%Goal of Therapy: <7%Additional Action Suggested:  >8%   . Sodium 10/14/2016 140  135 - 145 mEq/L Final  . Potassium 10/14/2016 4.5  3.5 - 5.1 mEq/L Final  . Chloride 10/14/2016 106  96 - 112 mEq/L Final  . CO2 10/14/2016 27  19 - 32 mEq/L Final  . Glucose, Bld 10/14/2016 264* 70 - 99 mg/dL Final  . BUN 76/48/7655 26* 6 - 23 mg/dL Final  . Creatinine, Ser 10/14/2016 1.44  0.40 - 1.50 mg/dL Final  . Calcium 14/92/0600 9.5  8.4 - 10.5 mg/dL Final  . GFR 79/98/6215 49.89* >60.00 mL/min Final  . TSH 10/14/2016 27.69* 0.35 - 4.50 uIU/mL Final  . Free T4  10/14/2016 0.58* 0.60 - 1.60 ng/dL Final   Comment: Specimens from patients who are undergoing biotin therapy and /or ingesting biotin supplements may contain high levels of biotin.  The higher biotin concentration in these specimens interferes with this Free T4 assay.  Specimens that contain high levels  of biotin may cause false high results for this Free T4 assay.  Please interpret results in light of the total clinical presentation of the patient.      Physical Examination:  BP (!) 140/92   Pulse 63   Ht 5\' 5"  (1.651 m)   Wt 179 lb 12.8 oz (81.6 kg)   SpO2 96%   BMI 29.92 kg/m    ASSESSMENT:  Diabetes type 2, BMI 30 See history of present illness for detailed discussion of current diabetes management, blood sugar patterns and problems identified  His A1c is again excellent at 6.5 even though his blood sugars maybe as high as 183 fasting and 264 postprandial on the lab He does need to check readings after meals which he is not doing currently Also discussed that drinking regular boost may be causing his sugars to be higher after breakfast sometimes as in the lab  However considering his age and overall medical status his control is still adequate  HYPOTHYROIDISM: TSH is much higher now and this is unexplained Previously even with taking the iron tablet with her Synthroid his TSH was low normal However most likely his high TSH is related to the interaction with iron He appears to be symptomatic also  HYPERTENSION: He will need to follow-up with PCP for medication adjustments  PLAN:   He will continue the same dose of Toujeo .  Discussed actions of basal insulin  Also discussed that if he sugars are consistently high over 200 after meals he may need rapid acting insulin  Does need to check readings after meals and discussed blood sugar targets  Increase walking for exercise as much as tolerated  Start using the diabetic boost instead of regular   No change in Victoza,  Amaryl or metformin  He will switch to 125 g of levothyroxine and not take the iron in the morning  Will need to follow-up in 6 weeks to reassess his current problems   Patient Instructions  Use diabetic boost  Take Iron in pm not in am  Go back to 125 ug thyroid pill  Check blood sugars on waking up  3/7 days  Also check blood sugars about 2 hours after a meal and do this after different meals by rotation  Recommended blood sugar levels on waking up is 90-130 and about 2 hours after meal is 130-160  Please bring your blood sugar  monitor to each visit, thank you     Counseling time on subjects discussed in assessment and plan sections is over 50% of today's 25 minute visit   Elfida Shimada 10/18/2016, 12:09 PM   Note: This office note was prepared with Estate agent. Any transcriptional errors that result from this process are unintentional.

## 2016-10-18 ENCOUNTER — Telehealth: Payer: Self-pay | Admitting: Endocrinology

## 2016-10-18 MED ORDER — LEVOTHYROXINE SODIUM 125 MCG PO TABS
125.0000 ug | ORAL_TABLET | Freq: Every day | ORAL | 0 refills | Status: DC
Start: 2016-10-18 — End: 2017-05-09

## 2016-10-18 NOTE — Telephone Encounter (Signed)
SENT 

## 2016-10-18 NOTE — Telephone Encounter (Signed)
Patient called in reference to Dwyane Dee wanting to know if patient had Rx "Levothyroxine 125Mg ". Patient stated he does not have this and wanted to let him know. Please call patient and advise.

## 2016-10-18 NOTE — Telephone Encounter (Signed)
Please send prescription for the 125 levothyroxine

## 2016-10-21 ENCOUNTER — Telehealth: Payer: Self-pay

## 2016-10-21 NOTE — Telephone Encounter (Signed)
Called pt to schedule Medicare Annual Wellness Visit with nurse health advisor prior to upcoming visit with pcp. -nr

## 2016-11-07 ENCOUNTER — Ambulatory Visit (INDEPENDENT_AMBULATORY_CARE_PROVIDER_SITE_OTHER): Payer: Medicare Other | Admitting: Family Medicine

## 2016-11-07 ENCOUNTER — Ambulatory Visit (INDEPENDENT_AMBULATORY_CARE_PROVIDER_SITE_OTHER): Payer: Medicare Other

## 2016-11-07 ENCOUNTER — Encounter: Payer: Self-pay | Admitting: Family Medicine

## 2016-11-07 VITALS — BP 183/94 | HR 64 | Temp 98.0°F | Ht 65.0 in | Wt 175.1 lb

## 2016-11-07 VITALS — BP 150/84 | HR 60

## 2016-11-07 DIAGNOSIS — K2100 Gastro-esophageal reflux disease with esophagitis, without bleeding: Secondary | ICD-10-CM

## 2016-11-07 DIAGNOSIS — R05 Cough: Secondary | ICD-10-CM | POA: Diagnosis not present

## 2016-11-07 DIAGNOSIS — Z8719 Personal history of other diseases of the digestive system: Secondary | ICD-10-CM

## 2016-11-07 DIAGNOSIS — Z0000 Encounter for general adult medical examination without abnormal findings: Secondary | ICD-10-CM

## 2016-11-07 DIAGNOSIS — I10 Essential (primary) hypertension: Secondary | ICD-10-CM

## 2016-11-07 DIAGNOSIS — E039 Hypothyroidism, unspecified: Secondary | ICD-10-CM

## 2016-11-07 DIAGNOSIS — Z23 Encounter for immunization: Secondary | ICD-10-CM | POA: Diagnosis not present

## 2016-11-07 DIAGNOSIS — R059 Cough, unspecified: Secondary | ICD-10-CM

## 2016-11-07 LAB — CBC
Hematocrit: 41.2 % (ref 37.5–51.0)
Hemoglobin: 13.5 g/dL (ref 13.0–17.7)
MCH: 29 pg (ref 26.6–33.0)
MCHC: 32.8 g/dL (ref 31.5–35.7)
MCV: 88 fL (ref 79–97)
PLATELETS: 219 10*3/uL (ref 150–379)
RBC: 4.66 x10E6/uL (ref 4.14–5.80)
RDW: 14.7 % (ref 12.3–15.4)
WBC: 9.5 10*3/uL (ref 3.4–10.8)

## 2016-11-07 NOTE — Patient Instructions (Addendum)
Zachary [name] , Thank you for taking time to come for your Medicare Wellness Visit. I appreciate your ongoing commitment to your health goals. Please review the following plan we discussed and let me know if I can assist you in the future.   Screening recommendations/referrals: Colonoscopy: up to date, no further testing needed Recommended yearly ophthalmology/optometry visit for glaucoma screening and checkup Recommended yearly dental visit for hygiene and checkup  Vaccinations: Influenza vaccine: administered today Pneumococcal vaccine: pneumovax 23 administered today.  Tdap vaccine: up to date, next one due 09/17/2024 Shingles vaccine: up to date, received 3 years ago per patient. Check with pharmacy and see if they have the new Shingrix vaccine available.   Advanced directives: Advance directive discussed with you today. Even though you declined this today please call our office should you change your mind and we can give you the proper paperwork for you to fill out.   Conditions/risks identified: Encouraged patient to drink more water daily. Hydration is important in keeping [place] healthy.  Next appointment: today 11/07/2016 @ 2:20 pm  Preventive Care 65 Years and Older, Male Preventive care refers to lifestyle choices and visits with your health care provider that can promote health and wellness. What does preventive care include?  A yearly physical exam. This is also called an annual well check.  Dental exams once or twice a year.  Routine eye exams. Ask your health care provider how often you should have your eyes checked.  Personal lifestyle choices, including:  Daily care of your teeth and gums.  Regular physical activity.  Eating a healthy diet.  Avoiding tobacco and drug use.  Limiting alcohol use.  Practicing safe sex.  Taking low doses of aspirin every day.  Taking vitamin and mineral supplements as recommended by your health care provider. What happens during  an annual well check? The services and screenings done by your health care provider during your annual well check will depend on your age, overall health, lifestyle risk factors, and family history of disease. Counseling  Your health care provider may ask you questions about your:  Alcohol use.  Tobacco use.  Drug use.  Emotional well-being.  Home and relationship well-being.  Sexual activity.  Eating habits.  History of falls.  Memory and ability to understand (cognition).  Work and work Statistician. Screening  You may have the following tests or measurements:  Height, weight, and BMI.  Blood pressure.  Lipid and cholesterol levels. These may be checked every 5 years, or more frequently if you are over 37 years old.  Skin check.  Lung cancer screening. You may have this screening every year starting at age 55 if you have a 30-pack-year history of smoking and currently smoke or have quit within the past 15 years.  Fecal occult blood test (FOBT) of the stool. You may have this test every year starting at age 71.  Flexible sigmoidoscopy or colonoscopy. You may have a sigmoidoscopy every 5 years or a colonoscopy every 10 years starting at age 50.  Prostate cancer screening. Recommendations will vary depending on your family history and other risks.  Hepatitis C blood test.  Hepatitis B blood test.  Sexually transmitted disease (STD) testing.  Diabetes screening. This is done by checking your blood sugar (glucose) after you have not eaten for a while (fasting). You may have this done every 1-3 years.  Abdominal aortic aneurysm (AAA) screening. You may need this if you are a current or former smoker.  Osteoporosis. You may  be screened starting at age 107 if you are at high risk. Talk with your health care provider about your test results, treatment options, and if necessary, the need for more tests. Vaccines  Your health care provider may recommend certain vaccines,  such as:  Influenza vaccine. This is recommended every year.  Tetanus, diphtheria, and acellular pertussis (Tdap, Td) vaccine. You may need a Td booster every 10 years.  Zoster vaccine. You may need this after age 29.  Pneumococcal 13-valent conjugate (PCV13) vaccine. One dose is recommended after age 38.  Pneumococcal polysaccharide (PPSV23) vaccine. One dose is recommended after age 34. Talk to your health care provider about which screenings and vaccines you need and how often you need them. This information is not intended to replace advice given to you by your health care provider. Make sure you discuss any questions you have with your health care provider. Document Released: 03/24/2015 Document Revised: 11/15/2015 Document Reviewed: 12/27/2014 Elsevier Interactive Patient Education  2017 [employer] Prevention in the Home Falls can cause injuries. They can happen to people of all ages. There are many things you can do to make your home safe and to help prevent falls. What can I do on the outside of my home?  Regularly fix the edges of walkways and driveways and fix any cracks.  Remove anything that might make you trip as you walk through a door, such as a raised step or threshold.  Trim any bushes or trees on the path to your home.  Use bright outdoor lighting.  Clear any walking paths of anything that might make someone trip, such as rocks or tools.  Regularly check to see if handrails are loose or broken. Make sure that both sides of any steps have handrails.  Any raised decks and porches should have guardrails on the edges.  Have any leaves, snow, or ice cleared regularly.  Use sand or salt on walking paths during winter.  Clean up any spills in your garage right away. This includes oil or grease spills. What can I do in the bathroom?  Use night lights.  Install grab bars by the toilet and in the tub and shower. Do not use towel bars as grab bars.  Use  non-skid mats or decals in the tub or shower.  If you need to sit down in the shower, use a plastic, non-slip stool.  Keep the floor dry. Clean up any water that spills on the floor as soon as it happens.  Remove soap buildup in the tub or shower regularly.  Attach bath mats securely with double-sided non-slip rug tape.  Do not have throw rugs and other things on the floor that can make you trip. What can I do in the bedroom?  Use night lights.  Make sure that you have a light by your bed that is easy to reach.  Do not use any sheets or blankets that are too big for your bed. They should not hang down onto the floor.  Have a firm chair that has side arms. You can use this for support while you get dressed.  Do not have throw rugs and other things on the floor that can make you trip. What can I do in the kitchen?  Clean up any spills right away.  Avoid walking on wet floors.  Keep items that you use a lot in easy-to-reach places.  If you need to reach something above you, use a strong step stool that has  a grab bar.  Keep electrical cords out of the way.  Do not use floor polish or wax that makes floors slippery. If you must use wax, use non-skid floor wax.  Do not have throw rugs and other things on the floor that can make you trip. What can I do with my stairs?  Do not leave any items on the stairs.  Make sure that there are handrails on both sides of the stairs and use them. Fix handrails that are broken or loose. Make sure that handrails are as long as the stairways.  Check any carpeting to make sure that it is firmly attached to the stairs. Fix any carpet that is loose or worn.  Avoid having throw rugs at the top or bottom of the stairs. If you do have throw rugs, attach them to the floor with carpet tape.  Make sure that you have a light switch at the top of the stairs and the bottom of the stairs. If you do not have them, ask someone to add them for you. What  else can I do to help prevent falls?  Wear shoes that:  Do not have high heels.  Have rubber bottoms.  Are comfortable and fit you well.  Are closed at the toe. Do not wear sandals.  If you use a stepladder:  Make sure that it is fully opened. Do not climb a closed stepladder.  Make sure that both sides of the stepladder are locked into place.  Ask someone to hold it for you, if possible.  Clearly mark and make sure that you can see:  Any grab bars or handrails.  First and last steps.  Where the edge of each step is.  Use tools that help you move around (mobility aids) if they are needed. These include:  Canes.  Walkers.  Scooters.  Crutches.  Turn on the lights when you go into a dark area. Replace any light bulbs as soon as they burn out.  Set up your furniture so you have a clear path. Avoid moving your furniture around.  If any of your floors are uneven, fix them.  If there are any pets around you, be aware of where they are.  Review your medicines with your doctor. Some medicines can make you feel dizzy. This can increase your chance of falling. Ask your doctor what other things that you can do to help prevent falls. This information is not intended to replace advice given to you by your health care provider. Make sure you discuss any questions you have with your health care provider. Document Released: 12/22/2008 Document Revised: 08/03/2015 Document Reviewed: 04/01/2014 Elsevier Interactive Patient Education  2017 [employer].

## 2016-11-07 NOTE — Progress Notes (Signed)
Subjective:   Zachary [name] is a 83 y.o. male who presents for Medicare Annual/Subsequent preventive examination.  Review of Systems:  N/A Cardiac Risk Factors include: advanced age (>3mn, >>40women);diabetes mellitus;dyslipidemia;hypertension;male gender     Objective:    Vitals: BP (!) 183/94 (BP Location: Left Arm)   Pulse 64   Temp 98 F (36.7 C) (Oral)   Ht '5\' 5"'$  (1.651 m)   Wt 175 lb 2 oz (79.4 kg)   BMI 29.14 kg/m   Body mass index is 29.14 kg/m.  Tobacco History  Smoking Status  . Never Smoker  Smokeless Tobacco  . Never Used     Counseling given: Not Answered   Past Medical History:  Diagnosis Date  . Barrett's esophagus   . Diabetes mellitus without complication ([place]   . Diverticulosis   . Duodenitis   . GERD (gastroesophageal reflux disease)   . Hiatal hernia    Past Surgical History:  Procedure Laterality Date  . IR GENERIC HISTORICAL  05/28/2016   IR ANGIOGRAM SELECTIVE EACH ADDITIONAL VESSEL 05/28/2016 GAletta Edouard MD [facility]  . IR GENERIC HISTORICAL  05/28/2016   IR ANGIOGRAM VISCERAL SELECTIVE 05/28/2016 GAletta Edouard MD [facility]  . IR GENERIC HISTORICAL  05/28/2016   IR [place] V[place] ACCESS RIGHT 05/28/2016 GAletta Edouard MD [facility]  . IR GENERIC HISTORICAL  05/28/2016   IR ANGIOGRAM VISCERAL SELECTIVE 05/28/2016 DArne Cleveland MD [facility]  . IR GENERIC HISTORICAL  05/28/2016   IR ANGIOGRAM SELECTIVE EACH ADDITIONAL VESSEL 05/28/2016 DArne Cleveland MD [facility]  . IR GENERIC HISTORICAL  05/28/2016   IR EMBO ART  VEN HEMORR LYMPH EXTRAV  INC GUIDE ROADMAPPING 05/28/2016 DArne Cleveland MD [facility]  . IR GENERIC HISTORICAL  05/28/2016   IR [place] V[place] ACCESS RIGHT 05/28/2016 DArne Cleveland MD [facility]  . PARTIAL COLECTOMY N/A 06/06/2016   Procedure: OPEN ASCENDING COLECTOMY;  Surgeon: FStark Klein MD;  Location: [facility];  Service: General;  Laterality: N/A;  . VASECTOMY     Family History  Problem  Relation Age of Onset  . Heart disease Mother   . Diabetes Mother   . Cancer Father   . Heart attack Son    History  Sexual Activity  . Sexual activity: Not on file    Outpatient Encounter Prescriptions as of 11/07/2016  Medication Sig  . ACCU-CHEK SMARTVIEW test strip USE TO TEST BLOOD SUGAR TWICE DAILY  . amLODipine (NORVASC) 5 MG tablet Take 1 tablet (5 mg total) by mouth daily.  .Marland Kitchenaspirin 81 MG tablet Take 81 mg by mouth daily.  . B-D UF III MINI PEN NEEDLES 31G X 5 MM MISC USE ONE PER DAY WITH VICTOZA  . Blood Glucose Monitoring Suppl (ACCU-CHEK NANO SMARTVIEW) W/DEVICE KIT   . esomeprazole (NEXIUM) 40 MG capsule TAKE ONE CAPSULE BY MOUTH DAILY AT NOON  . ferrous sulfate 325 (65 FE) MG tablet Take 1 tablet (325 mg total) by mouth 3 (three) times daily with meals. (Patient taking differently: Take 325 mg by mouth daily. )  . FLUAD 0.5 ML SUSY   . glimepiride (AMARYL) 2 MG tablet TAKE 2 TABLETS(4 MG) BY MOUTH DAILY BEFORE DINNER  . Insulin Glargine (TOUJEO SOLOSTAR) 300 UNIT/ML SOPN Inject 12 Units into the skin daily. (Patient taking differently: Inject 16 Units into the skin daily. )  . levothyroxine (SYNTHROID, LEVOTHROID) 125 MCG tablet Take 1 tablet (125 mcg total) by mouth daily.  . metFORMIN (GLUCOPHAGE-XR) 750 MG  24 hr tablet TAKE 2 TABLETS(1500 MG) BY MOUTH DAILY WITH BREAKFAST  . naproxen sodium (ANAPROX) 220 MG tablet Take 220 mg by mouth 2 (two) times daily as needed (pain).  . simvastatin (ZOCOR) 40 MG tablet TAKE 1 TABLET BY MOUTH AT BEDTIME  . tamsulosin (FLOMAX) 0.4 MG CAPS capsule TAKE 1 CAPSULE BY MOUTH EVERY DAY  . VICTOZA 18 MG/3ML SOPN ADMINISTER 1.2 MG UNDER THE SKIN DAILY   No facility-administered encounter medications on file as of 11/07/2016.     Activities of Daily Living In your present state of health, do you have any difficulty performing the following activities: 11/07/2016 06/03/2016  Hearing? Y Y  Comment Patient wears hearing aids  -  Vision? N Y    Difficulty concentrating or making decisions? N Y  Walking or climbing stairs? N Y  Dressing or bathing? N N  Doing errands, shopping? N N  Preparing Food and eating ? N -  Using the Toilet? N -  In the past six months, have you accidently leaked urine? N -  Do you have problems with loss of bowel control? N -  Managing your Medications? N -  Managing your Finances? N -  Housekeeping or managing your Housekeeping? N -  Some recent data might be hidden    Patient Care Team: [name], MD as PCP - General (Family Medicine) Elayne Snare, MD as Consulting Physician (Endocrinology) Virgina Evener, OD as Referring Physician (Optometry)   Assessment:     Exercise Activities and Dietary recommendations Current Exercise Habits: Home exercise routine, Type of exercise: strength training/weights, Time (Minutes): 15, Frequency (Times/Week): 5, Weekly Exercise (Minutes/Week): 75, Intensity: Moderate, Exercise limited by: None identified  Goals    . Increase water intake          Patient will try to drink more water daily.       Fall Risk Fall Risk  11/07/2016 08/01/2016 03/22/2016 12/04/2015 01/19/2015  Falls in the past year? No No No No Yes  Number falls in past yr: - - - - 1  Injury with Fall? - - - - No   Depression Screen PHQ 2/9 Scores 11/07/2016 08/01/2016 03/22/2016 12/04/2015  PHQ - 2 Score 0 0 0 0    Cognitive Function     6CIT Screen 11/07/2016  What Year? 0 points  What month? 0 points  What time? 0 points  Count back from 20 0 points  Months in reverse 0 points  Repeat phrase 0 points  Total Score 0    Immunization History  Administered Date(s) Administered  . Influenza Split 11/30/2015  . Influenza,inj,Quad PF,6+ Mos 11/07/2016  . Influenza-Unspecified 12/10/2011, 11/24/2013, 12/06/2014  . Pneumococcal Conjugate-13 12/02/2013  . Pneumococcal Polysaccharide-23 11/07/2016  . Td 09/18/2014   Screening Tests Health Maintenance  Topic Date Due  . FOOT  EXAM  04/30/2016  . HEMOGLOBIN A1C  04/16/2017  . URINE MICROALBUMIN  07/12/2017  . OPHTHALMOLOGY EXAM  10/03/2017  . TETANUS/TDAP  09/17/2024  . INFLUENZA VACCINE  Completed  . PNA vac Low Risk Adult  Completed      Plan:  I have personally reviewed and noted the following in the patient's chart:   . Medical and social history . Use of alcohol, tobacco or illicit drugs  . Current medications and supplements . Functional ability and status . Nutritional status . Physical activity . Advanced directives . List of other physicians . Hospitalizations, surgeries, and ER visits in previous 12 months . Vitals .  Screenings to include cognitive, depression, and falls . Referrals and appointments  In addition, I have reviewed and discussed with patient certain preventive protocols, quality metrics, and best practice recommendations. A written personalized care plan for preventive services as well as general preventive health recommendations were provided to patient.     [place], Sult, LPN  [id number]

## 2016-11-07 NOTE — Patient Instructions (Addendum)
  It appears that Dr. Cruzita Lederer did send in the new dose of thyroid medication (121mcg) to [facility].  I will check blood counts again today to make sure anemia is stable.   Keep a record of your blood pressures outside of the office and if those readings are remaining over 140/90 - let me know.   Return to the clinic or go to the nearest emergency room if any of your symptoms worsen or new symptoms occur.  Follow-up the next 3 months for blood pressure and cholesterol. Let me know if you have questions in the meantime.    IF you received an x-ray today, you will receive an invoice from [facility]. Please contact [facility] at [phone] with questions or concerns regarding your invoice.   IF you received labwork today, you will receive an invoice from [facility]. Please contact [facility] at [phone] with questions or concerns regarding your invoice.   Our billing staff will not be able to assist you with questions regarding bills from these companies.  You will be contacted with the lab results as soon as they are available. The fastest way to get your results is to activate your My Chart account. Instructions are located on the last page of this paperwork. If you have not heard from [place] regarding the results in 2 weeks, please contact this office.

## 2016-11-07 NOTE — Progress Notes (Signed)
Subjective:  By signing my name below, I, Zachary [name], attest that this documentation has been prepared under the direction and in the presence of Zachary Ray, MD. Electronically Signed: Moises [name], [place]. 11/07/2016 , 3:21 PM .  Patient was seen in Room 25 .   Patient ID: Zachary [name], male    DOB: 10-08-1932, 83 y.o.   MRN: [id number] Chief Complaint  Patient presents with  . Follow-up    previous visit   HPI Zachary [name] is a 83 y.o. male  Here for follow up.   GI bleed I last saw him as a hospital follow up on May 24th after a GI bleed in March. He was then readmitted through April 8th for repeat GI bleed. He had a hemicolectomy, GI is Dr. Hilarie Fredrickson and surgeon is Dr. Barry Dienes. His hemoglobin had improved from 8.9 to 11.6 on May 24th visit. Recommended repeat testing in 1 month. He had borderline elevated sodium and creatinine.   He denies [name] in stool, lightheaded or dizziness. He is taking iron supplement, and has been taking it in the evening instead of the mornings.   DM He is followed by Dr. Dwyane Dee, last office visit on Aug 9th.   GERD with Barrett's See last GI note on June 13th. Discontinued surveillance endoscopy and continued on Nexium '40mg'$  QD.   Hypothyroidism Lab Results  Component Value Date   TSH 27.69 (H) 10/14/2016   He is followed by Dr. Dwyane Dee. Changed his synthroid to 146mg QD. He was also instructed not to combine with iron when discussed at endocrine.   He states he hasn't received his new dose of thyroid medication; but it does appear that Dr. GCruzita Lederersent in his new dose.   HTN He is on norvasc '5mg'$  QD. He's been feeling slightly unbalanced and not back to 100% since his surgery. He hasn't checked his BP when this occurs. He denies missing his medication. He has a machine at home.   Lab Results  Component Value Date   CREATININE 1.44 10/14/2016   Cough He mentions having a cough with phlegm a week ago, but this had improved. He  denies any recent fever or shortness of breath.   Patient Active Problem List   Diagnosis Date Noted  . Malnutrition of moderate degree 06/13/2016  . Ileus following gastrointestinal surgery 06/12/2016  . GI bleed 06/03/2016  . Syncope   . Diverticulosis of colon with hemorrhage   . Hematochezia   . Lower gastrointestinal bleeding   . BRBPR (bright red [name] per rectum) 05/27/2016  . Acute [name] loss anemia 05/27/2016  . Hypotension due to [name] loss 05/27/2016  . AKI (acute kidney injury) ([facility] 05/27/2016  . Hyperkalemia 05/27/2016  . Adult onset hypothyroidism 12/20/2013  . Type II diabetes mellitus, uncontrolled ([facility] 12/20/2013  . Hypogonadism in male 12/02/2013  . Type 2 diabetes mellitus ([facility] 05/09/2011  . Hypertension 05/09/2011  . GERD (gastroesophageal reflux disease) 05/09/2011  . Hypogonadism male 05/09/2011   Past Medical History:  Diagnosis Date  . Barrett's esophagus   . Diabetes mellitus without complication ([facility]   . Diverticulosis   . Duodenitis   . GERD (gastroesophageal reflux disease)   . Hiatal hernia    Past Surgical History:  Procedure Laterality Date  . IR GENERIC HISTORICAL  05/28/2016   IR ANGIOGRAM SELECTIVE EACH ADDITIONAL VESSEL 05/28/2016 GAletta Edouard MD [facility]  . IR GENERIC HISTORICAL  05/28/2016   IR ANGIOGRAM VISCERAL SELECTIVE 05/28/2016 GAletta Edouard MD [facility]  . IR GENERIC HISTORICAL  05/28/2016   IR US GUIDE VASC ACCESS RIGHT 05/28/2016 Aletta Edouard, MD [facility]  . IR GENERIC HISTORICAL  05/28/2016   IR ANGIOGRAM VISCERAL SELECTIVE 05/28/2016 Arne Cleveland, MD [facility]  . IR GENERIC HISTORICAL  05/28/2016   IR ANGIOGRAM SELECTIVE EACH ADDITIONAL VESSEL 05/28/2016 Arne Cleveland, MD [facility]  . IR GENERIC HISTORICAL  05/28/2016   IR EMBO ART  VEN HEMORR LYMPH EXTRAV  INC GUIDE ROADMAPPING 05/28/2016 Arne Cleveland, MD [facility]  . IR GENERIC HISTORICAL  05/28/2016   IR US GUIDE VASC ACCESS RIGHT 05/28/2016  Arne Cleveland, MD [facility]  . PARTIAL COLECTOMY N/A 06/06/2016   Procedure: OPEN ASCENDING COLECTOMY;  Surgeon: Stark Klein, MD;  Location: [facility];  Service: General;  Laterality: N/A;  . VASECTOMY     No Known Allergies Prior to Admission medications   Medication Sig Start Date End Date Taking? Authorizing Provider  ACCU-CHEK SMARTVIEW test strip USE TO TEST [name] SUGAR TWICE DAILY 10/11/16   Elayne Snare, MD  amLODipine (NORVASC) 5 MG tablet Take 1 tablet (5 mg total) by mouth daily. 06/16/16   Caren Griffins, MD  aspirin 81 MG tablet Take 81 mg by mouth daily.    [provider]  B-D UF III MINI PEN NEEDLES 31G X 5 MM MISC USE ONE PER DAY WITH VICTOZA 11/22/15   Elayne Snare, MD  [name] Glucose Monitoring Suppl (ACCU-CHEK NANO SMARTVIEW) W/DEVICE KIT  12/02/13   [provider]  esomeprazole (NEXIUM) 40 MG capsule TAKE ONE CAPSULE BY MOUTH DAILY AT Gwendolyn Fill 09/28/16   [name], MD  ferrous sulfate 325 (65 FE) MG tablet Take 1 tablet (325 mg total) by mouth 3 (three) times daily with meals. Patient taking differently: Take 325 mg by mouth daily.  05/31/16   Doreatha Lew, MD  FLUAD 0.5 ML SUSY  11/30/15   [provider]  glimepiride (AMARYL) 2 MG tablet TAKE 2 TABLETS(4 MG) BY MOUTH DAILY BEFORE DINNER 10/17/16   Elayne Snare, MD  Insulin Glargine (TOUJEO SOLOSTAR) 300 UNIT/ML SOPN Inject 12 Units into the skin daily. Patient taking differently: Inject 16 Units into the skin daily.  04/10/16   Elayne Snare, MD  levothyroxine (SYNTHROID, LEVOTHROID) 125 MCG tablet Take 1 tablet (125 mcg total) by mouth daily. 10/18/16   Philemon Kingdom, MD  metFORMIN (GLUCOPHAGE-XR) 750 MG 24 hr tablet TAKE 2 TABLETS(1500 MG) BY MOUTH DAILY WITH BREAKFAST 07/15/16   Elayne Snare, MD  naproxen sodium (ANAPROX) 220 MG tablet Take 220 mg by mouth 2 (two) times daily as needed (pain).    [provider]  simvastatin (ZOCOR) 40 MG tablet TAKE 1 TABLET BY MOUTH AT BEDTIME  08/13/16   [name], MD  tamsulosin (FLOMAX) 0.4 MG CAPS capsule TAKE 1 CAPSULE BY MOUTH EVERY DAY 10/07/16   [name], MD  VICTOZA 18 MG/3ML SOPN ADMINISTER 1.2 MG UNDER THE SKIN DAILY 10/10/16   Elayne Snare, MD   Social History   Social History  . Marital status: Married    Spouse name: N/A  . Number of children: N/A  . Years of education: N/A   Occupational History  . Not on file.   Social History Main Topics  . Smoking status: Never Smoker  . Smokeless tobacco: Never Used  . Alcohol use No  . Drug use: No  . Sexual activity: Not on file   Other Topics Concern  . Not on  file   Social History Narrative  . No narrative on file   Review of Systems  Constitutional: Negative for fatigue and unexpected weight change.  Eyes: Negative for visual disturbance.  Respiratory: Negative for cough, chest tightness and shortness of breath.   Cardiovascular: Negative for chest pain, palpitations and leg swelling.  Gastrointestinal: Negative for abdominal pain and [name] in stool.  Neurological: Negative for dizziness, light-headedness and headaches.       Objective:   Physical Exam  Constitutional: He is oriented to person, place, and time. He appears well-developed and well-nourished.  HENT:  Head: Normocephalic and atraumatic.  Eyes: Pupils are equal, round, and reactive to light. EOM are normal.  Neck: No JVD present. Carotid bruit is not present.  Cardiovascular: Normal rate, regular rhythm and normal heart sounds.   No murmur heard. Pulmonary/Chest: Effort normal and breath sounds normal. He has no rales.  Musculoskeletal: He exhibits no edema.  Neurological: He is alert and oriented to person, place, and time.  Skin: Skin is warm and dry.  Psychiatric: He has a normal mood and affect.  Vitals reviewed. Few coarse breath sounds at base of lungs. Normal effort and no distress.  Vitals:   11/07/16 1408 11/07/16 1459  BP: (!) 174/92 (!) 150/84  Pulse:  60       Assessment & Plan:  Zachary [name] is a 83 y.o. male History of gastrointestinal bleeding - Plan: CBC GERD with esophagitis  -Denies recent symptoms. Repeat CBC, ER/RTC precautions if any melena, worsening dizziness, abdominal pain.  -Continue PPI, avoid NSAIDs. Further endoscopic evaluation for monitoring of Barrett's esophagus has been deferred.  Hypothyroidism, unspecified type  -Appears to have had recent new prescription for adjust the dose of Synthroid, recommend he discuss with his pharmacy, or endocrinology if needed.  Cough  -Improving. Few coarse breath sounds heard on exam. Afebrile. Advised if any worsening of symptoms to be seen right away for possible imaging   Essential hypertension  -Borderline elevated in office. Monitor at home and if persistent elevations, consider change in regimen. Would also recommend testing his [name] pressure if he has any orthostatic/dizziness symptoms. RTC precautions  No orders of the defined types were placed in this encounter.  Patient Instructions    It appears that Dr. Cruzita Lederer did send in the new dose of thyroid medication (17mg) to [facility].  I will check [name] counts again today to make sure anemia is stable.   Keep a record of your [name] pressures outside of the office and if those readings are remaining over 140/90 - let me know.   Return to the clinic or go to the nearest emergency room if any of your symptoms worsen or new symptoms occur.  Follow-up the next 3 months for [name] pressure and cholesterol. Let me know if you have questions in the meantime.    IF you received an x-[name] today, you will receive an [facility]. Please contact [facility] at [phone] questions or concerns regarding your invoice.   IF you received labwork today, you will receive an invoice from [facility] Please contact [facility] at [phone] questions or concerns regarding your invoice.   Our  billing staff will not be able to assist you with questions regarding bills from these companies.  You will be contacted with the lab results as soon as they are available. The fastest way to get your results is to activate your My Chart account. Instructions are located on the last page  of this paperwork. If you have not heard from [place] regarding the results in 2 weeks, please contact this office.       I personally performed the services described in this documentation, which was scribed in my presence. The recorded information has been reviewed and considered for accuracy and completeness, addended by me as needed, and agree with information above.  Signed,   Zachary Ray, MD [facility] [place].  11/09/16 3:23 PM

## 2016-11-08 ENCOUNTER — Other Ambulatory Visit: Payer: Self-pay | Admitting: Endocrinology

## 2016-11-09 ENCOUNTER — Other Ambulatory Visit: Payer: Self-pay | Admitting: Endocrinology

## 2016-11-13 ENCOUNTER — Encounter: Payer: Self-pay | Admitting: Radiology

## 2016-11-13 ENCOUNTER — Other Ambulatory Visit: Payer: Self-pay | Admitting: Endocrinology

## 2016-11-15 ENCOUNTER — Telehealth: Payer: Self-pay | Admitting: Family Medicine

## 2016-11-15 MED ORDER — AMLODIPINE BESYLATE 5 MG PO TABS: 5.0000 mg | ORAL_TABLET | Freq: Every day | ORAL | 1 refills | Status: DC

## 2016-11-15 NOTE — Telephone Encounter (Signed)
Patient brought letter to office indicating need for refill of Norvasc 5 mg. Apparently that had been prescribed by Dr. Cruzita Lederer prior, and it appears he has not been taking it. Blood pressure was elevated last visit. New prescription of amlodipine sent in.  Please call patient and advise him that I did send in the amlodipine based on his letter, but there are some potential issues with amlodipine and simvastatin with doses greater than 20 mg.   Initially I would recommend he check his home blood pressure readings. If those are remaining over 140/90, then can restart amlodipine, and let me know so we can look at other cholesterol medication options.

## 2016-11-25 NOTE — Telephone Encounter (Signed)
Addressed in other note.

## 2016-11-28 ENCOUNTER — Other Ambulatory Visit (INDEPENDENT_AMBULATORY_CARE_PROVIDER_SITE_OTHER): Payer: Medicare Other

## 2016-11-28 DIAGNOSIS — E1165 Type 2 diabetes mellitus with hyperglycemia: Secondary | ICD-10-CM

## 2016-11-28 DIAGNOSIS — Z794 Long term (current) use of insulin: Secondary | ICD-10-CM

## 2016-11-28 DIAGNOSIS — E063 Autoimmune thyroiditis: Secondary | ICD-10-CM

## 2016-11-28 LAB — BASIC METABOLIC PANEL
BUN: 30 mg/dL — ABNORMAL HIGH (ref 6–23)
CHLORIDE: 105 meq/L (ref 96–112)
CO2: 29 mEq/L (ref 19–32)
Calcium: 9.8 mg/dL (ref 8.4–10.5)
Creatinine, Ser: 1.69 mg/dL — ABNORMAL HIGH (ref 0.40–1.50)
GFR: 41.46 mL/min — AB (ref >60.00)
GLUCOSE: 182 mg/dL — AB (ref 70–99)
POTASSIUM: 5.4 meq/L — AB (ref 3.5–5.1)
SODIUM: 138 meq/L (ref 135–145)

## 2016-11-28 LAB — T4, FREE: Free T4: 0.65 ng/dL (ref 0.60–1.60)

## 2016-11-28 LAB — TSH: TSH: 20.98 u[IU]/mL — AB (ref 0.35–4.50)

## 2016-11-29 LAB — FRUCTOSAMINE: FRUCTOSAMINE: 323 umol/L — AB (ref 0–285)

## 2016-12-02 ENCOUNTER — Encounter: Payer: Self-pay | Admitting: Endocrinology

## 2016-12-02 ENCOUNTER — Ambulatory Visit (INDEPENDENT_AMBULATORY_CARE_PROVIDER_SITE_OTHER): Payer: Medicare Other | Admitting: Endocrinology

## 2016-12-02 VITALS — BP 130/78 | HR 70 | Ht 65.0 in | Wt 179.6 lb

## 2016-12-02 DIAGNOSIS — Z794 Long term (current) use of insulin: Secondary | ICD-10-CM

## 2016-12-02 DIAGNOSIS — E063 Autoimmune thyroiditis: Secondary | ICD-10-CM

## 2016-12-02 DIAGNOSIS — E1165 Type 2 diabetes mellitus with hyperglycemia: Secondary | ICD-10-CM | POA: Diagnosis not present

## 2016-12-02 DIAGNOSIS — E875 Hyperkalemia: Secondary | ICD-10-CM | POA: Diagnosis not present

## 2016-12-02 NOTE — Progress Notes (Signed)
Patient ID: Zachary Cook, male   DOB: 1933-01-10, 83 y.o.   MRN: [id number]           Reason for Appointment: Follow-Up    History of Present Illness:          Diagnosis: Type 2 diabetes mellitus, date of diagnosis: 2005       Past history: He was started on metformin when he was found to have diabetes on routine lab work. Details of this are not available He thinks his blood sugars had been fairly well controlled for several years with metformin alone but no records are available He has had a couple of different physicians and has not always been followed consistently for his diabetes  Blood sugars had been significantly higher in the 4 months prior to his consultation in 10/15 and his A1c had gone up to 12% with his regimen of glipizide and metformin  He was given a trial of Invokana but this did not improve his blood sugars He has been on Victoza since 10/15 and this was restarted in 1/16, initially had difficulty with nausea  Recent history:    INSULIN regimen: Toujeo 14 units daily in am  Non-insulin hypoglycemic drugs : Metformin ER 750 mg, 2 tablets daily, Amaryl 2 mg at supper, Victoza 1.2 mg daily  Since his blood sugars were high especially fasting he was started on Toujeo in 1/18  His fructosamine has gone up to 323   Current blood sugar patterns and problems identified:  He has checked his blood sugars only before his first meal again and not after meals as directed on each visit  Although his blood sugars were looking better last month they are mostly high although inconsistent  He thinks his largest meal is lunch time but has not checked readings after eating  He was supposed to be on 16 units but is taking only 14 units of TOUJEO  Not clear if the blood sugars are variable in the mornings because of variable postprandial readings in the evenings and diet or snacks  He is drinking diabetic boost every other day  His weight has increased a little even  though he thinks he is doing a little walking compared to last time  He is still tolerating Victoza well        Glucose monitoring:  done <1 times a day         Glucometer: Accu-Chek       Blood Glucose readings  Recently readings from download  RECENT range 80-230 fasting, lunchtime 146, 239  Overall AVERAGE 163  Self-care: The diet that the patient has been following is: None, usually low fat      Meals: 2-3 meals per day, dinner 6-7 . Breakfast is usually egg/Boost and no fried food          Exercise: walking  a little  Dietician visit, most recent: None.               Weight history: Previous range  175-205  Wt Readings from Last 3 Encounters:  12/02/16 179 lb 9.6 oz (81.5 kg)  11/07/16 175 lb 2 oz (79.4 kg)  10/17/16 179 lb 12.8 oz (81.6 kg)    Glycemic control:   Lab Results  Component Value Date   HGBA1C 6.5 10/14/2016   HGBA1C 6.3 07/12/2016   HGBA1C 8.4 (H) 03/08/2016   Lab Results  Component Value Date   MICROALBUR 14.0 (H) 07/12/2016   LDLCALC 93 03/08/2016   CREATININE  1.69 (H) 11/28/2016    OTHER active problems: See review of systems   Lab on 11/28/2016  Component Date Value Ref Range Status  . Sodium 11/28/2016 138  135 - 145 mEq/L Final  . Potassium 11/28/2016 5.4* 3.5 - 5.1 mEq/L Final  . Chloride 11/28/2016 105  96 - 112 mEq/L Final  . CO2 11/28/2016 29  19 - 32 mEq/L Final  . Glucose, Bld 11/28/2016 182* 70 - 99 mg/dL Final  . BUN 11/28/2016 30* 6 - 23 mg/dL Final  . Creatinine, Ser 11/28/2016 1.69* 0.40 - 1.50 mg/dL Final  . Calcium 11/28/2016 9.8  8.4 - 10.5 mg/dL Final  . GFR 11/28/2016 41.46* >60.00 mL/min Final  . Fructosamine 11/28/2016 323* 0 - 285 umol/L Final   Comment: Published reference interval for apparently healthy subjects between age 24 and 95 is 19 - 285 umol/L and in a poorly controlled diabetic population is 228 - 563 umol/L with a mean of 396 umol/L.   Marland Kitchen TSH 11/28/2016 20.98* 0.35 - 4.50 uIU/mL Final  . Free T4  11/28/2016 0.65  0.60 - 1.60 ng/dL Final   Comment: Specimens from patients who are undergoing biotin therapy and /or ingesting biotin supplements may contain high levels of biotin.  The higher biotin concentration in these specimens interferes with this Free T4 assay.  Specimens that contain high levels  of biotin may cause false high results for this Free T4 assay.  Please interpret results in light of the total clinical presentation of the patient.        Allergies as of 12/02/2016   No Known Allergies     Medication List       Accurate as of 12/02/16  2:06 PM. Always use your most recent med list.          ACCU-CHEK NANO SMARTVIEW w/Device Kit   ACCU-CHEK SMARTVIEW test strip Generic drug:  glucose blood USE TO TEST BLOOD SUGAR TWICE DAILY   amLODipine 5 MG tablet Commonly known as:  NORVASC Take 1 tablet (5 mg total) by mouth daily.   aspirin 81 MG tablet Take 81 mg by mouth daily.   B-D UF III MINI PEN NEEDLES 31G X 5 MM Misc Generic drug:  Insulin Pen Needle USE ONE PER DAY WITH VICTOZA   esomeprazole 40 MG capsule Commonly known as:  NEXIUM TAKE ONE CAPSULE BY MOUTH DAILY AT NOON   ferrous sulfate 325 (65 FE) MG tablet Take 1 tablet (325 mg total) by mouth 3 (three) times daily with meals.   FLUAD 0.5 ML Susy Generic drug:  Influenza Vac A&B Surf Ant Adj   glimepiride 2 MG tablet Commonly known as:  AMARYL TAKE 2 TABLETS(4 MG) BY MOUTH DAILY BEFORE DINNER   levothyroxine 125 MCG tablet Commonly known as:  SYNTHROID, LEVOTHROID Take 1 tablet (125 mcg total) by mouth daily.   metFORMIN 750 MG 24 hr tablet Commonly known as:  GLUCOPHAGE-XR TAKE 2 TABLETS(1500 MG) BY MOUTH DAILY WITH BREAKFAST   naproxen sodium 220 MG tablet Commonly known as:  ANAPROX Take 220 mg by mouth 2 (two) times daily as needed (pain).   simvastatin 40 MG tablet Commonly known as:  ZOCOR TAKE 1 TABLET BY MOUTH AT BEDTIME   tamsulosin 0.4 MG Caps capsule Commonly known as:   FLOMAX TAKE 1 CAPSULE BY MOUTH EVERY DAY   TOUJEO SOLOSTAR 300 UNIT/ML Sopn Generic drug:  Insulin Glargine INJECT 12 UNITS INTO THE SKIN DAILY   VICTOZA 18 MG/3ML Sopn Generic drug:  liraglutide ADMINISTER 1.2 MG UNDER THE SKIN DAILY       Allergies: No Known Allergies  Past Medical History:  Diagnosis Date  . Barrett's esophagus   . Diabetes mellitus without complication ([place])   . Diverticulosis   . Duodenitis   . GERD (gastroesophageal reflux disease)   . Hiatal hernia     Past Surgical History:  Procedure Laterality Date  . IR GENERIC HISTORICAL  05/28/2016   IR ANGIOGRAM SELECTIVE EACH ADDITIONAL VESSEL 05/28/2016 Aletta Edouard, MD [facility]  . IR GENERIC HISTORICAL  05/28/2016   IR ANGIOGRAM VISCERAL SELECTIVE 05/28/2016 Aletta Edouard, MD [facility]  . IR GENERIC HISTORICAL  05/28/2016   IR US GUIDE VASC ACCESS RIGHT 05/28/2016 Aletta Edouard, MD [facility]  . IR GENERIC HISTORICAL  05/28/2016   IR ANGIOGRAM VISCERAL SELECTIVE 05/28/2016 Arne Cleveland, MD [facility]  . IR GENERIC HISTORICAL  05/28/2016   IR ANGIOGRAM SELECTIVE EACH ADDITIONAL VESSEL 05/28/2016 Arne Cleveland, MD [facility]  . IR GENERIC HISTORICAL  05/28/2016   IR EMBO ART  VEN HEMORR LYMPH EXTRAV  INC GUIDE ROADMAPPING 05/28/2016 Arne Cleveland, MD [facility]  . IR GENERIC HISTORICAL  05/28/2016   IR US GUIDE VASC ACCESS RIGHT 05/28/2016 Arne Cleveland, MD [facility]  . PARTIAL COLECTOMY N/A 06/06/2016   Procedure: OPEN ASCENDING COLECTOMY;  Surgeon: Stark Klein, MD;  Location: [facility];  Service: General;  Laterality: N/A;  . VASECTOMY      Family History  Problem Relation Age of Onset  . Heart disease Mother   . Diabetes Mother   . Cancer Father   . Heart attack Son     Social History:  reports that he has never smoked. He has never used smokeless tobacco. He reports that he does not drink alcohol or use drugs.    Review of Systems     HYPERTENSION: His blood pressure Is  better, he thinks he is going on the amlodipine as discussed  BP Readings from Last 3 Encounters:  12/02/16 130/78  11/07/16 (!) 150/84  11/07/16 (!) 183/94   RENAL dysfunction and hyperkalemia:  Lab Results  Component Value Date   CREATININE 1.69 (H) 11/28/2016   BUN 30 (H) 11/28/2016   NA 138 11/28/2016   K 5.4 (H) 11/28/2016   CL 105 11/28/2016   CO2 29 11/28/2016          Lipids: Most recent labs as below, currently taking 40 mg Zocor       Lab Results  Component Value Date   CHOL 158 03/08/2016   HDL 40.90 03/08/2016   LDLCALC 93 03/08/2016   LDLDIRECT 125.0 12/14/2014   TRIG 123.0 03/08/2016   CHOLHDL 4 03/08/2016                  Thyroid:  He thinks he was found to have hypothyroidism about the year 2000 on routine labs without symptoms. Apparently he did not  feel any different with taking the thyroid supplement.   His dose previously had been progressively reduced   He also says that he is not taking his iron pill  with the thyroid medication  Because of low-normal TSH in 5/18 his dose was reduced down to 112 g   Labs as follows:  Lab Results  Component Value Date   TSH 20.98 (H) 11/28/2016   TSH 27.69 (H) 10/14/2016   TSH 0.53 07/12/2016   FREET4 0.65 11/28/2016   FREET4 0.58 (L) 10/14/2016   FREET4 1.16 03/08/2016  He has been told to have hypogonadism of unclear etiology for the last few years.  For some time has not been on any medications but has been having no fatigue and has been  reluctant to go back on AndroGel   Lab Results  Component Value Date   TESTOSTERONE 174.72 (L) 12/14/2014     LABS:  Lab on 11/28/2016  Component Date Value Ref Range Status  . Sodium 11/28/2016 138  135 - 145 mEq/L Final  . Potassium 11/28/2016 5.4* 3.5 - 5.1 mEq/L Final  . Chloride 11/28/2016 105  96 - 112 mEq/L Final  . CO2 11/28/2016 29  19 - 32 mEq/L Final  . Glucose, Bld 11/28/2016 182* 70 - 99 mg/dL Final  . BUN 11/28/2016 30* 6 - 23  mg/dL Final  . Creatinine, Ser 11/28/2016 1.69* 0.40 - 1.50 mg/dL Final  . Calcium 11/28/2016 9.8  8.4 - 10.5 mg/dL Final  . GFR 11/28/2016 41.46* >60.00 mL/min Final  . Fructosamine 11/28/2016 323* 0 - 285 umol/L Final   Comment: Published reference interval for apparently healthy subjects between age 74 and 41 is 76 - 285 umol/L and in a poorly controlled diabetic population is 228 - 563 umol/L with a mean of 396 umol/L.   Marland Kitchen TSH 11/28/2016 20.98* 0.35 - 4.50 uIU/mL Final  . Free T4 11/28/2016 0.65  0.60 - 1.60 ng/dL Final   Comment: Specimens from patients who are undergoing biotin therapy and /or ingesting biotin supplements may contain high levels of biotin.  The higher biotin concentration in these specimens interferes with this Free T4 assay.  Specimens that contain high levels  of biotin may cause false high results for this Free T4 assay.  Please interpret results in light of the total clinical presentation of the patient.      Physical Examination:  BP 130/78   Pulse 70   Ht _0  (1.651 m)   Wt 179 lb 9.6 oz (81.5 kg)   SpO2 95%   BMI 29.89 kg/m    ASSESSMENT:  Diabetes type 2, BMI 30 See history of present illness for detailed discussion of current diabetes management, blood sugar patterns and problems identified  His A1c is again excellent at 6.5 even though his blood sugars maybe as high as 183 fasting and 264 postprandial on the lab He does need to check readings after meals which he is not doing currently Also discussed that drinking regular boost may be causing his sugars to be higher after breakfast sometimes as in the lab  However considering his age and overall medical status his control is still adequate  HYPOTHYROIDISM: TSH is Still high This is possibly from his not taking his levothyroxine consistently or not at all  HYPERTENSION: Controlled now with amlodipine  Continue same regimen for now He will confirm he is taking amlodipine only and not  lisinopril by mistake  HYPERKALEMIA: This may be from taking lisinopril by mistake but etiology totally unclear, likely associated with somewhat worsening renal function Was advised of the low potassium diet and handout has been sent He will make sure he is not taking lisinopril by mistake  ?  Early dementia: He should be assessed by PCP, he has very difficult time following instructions and remembering what to do  PLAN:   He will start trying to check his readings after lunch or dinner and not just in the morning before his first meal  He may well need mealtime insulin also and discussed that this would also  help his overnight readings  Increased Toujeo by 2 units for now and will do 18 if morning sugars are consistently over 150  Need follow-up potassium in one month and consider nephrology consultation if still potassium is high with dietary changes   There are no Patient Instructions on file for this visit.   Counseling time on subjects discussed in assessment and plan sections is over 50% of today's 25 minute visit   Venida Tsukamoto 12/02/2016, 2:06 PM   Note: This office note was prepared with Estate agent. Any transcriptional errors that result from this process are unintentional.

## 2016-12-02 NOTE — Patient Instructions (Addendum)
Check blood sugars on waking up  3-4/7  Also check blood sugars about 2 hours after lunch meal and do this after different meals by rotation  Recommended blood sugar levels on waking up is 90-130 and about 2 hours after meal is 130-160  Please bring your blood sugar monitor to each visit, thank you  Toujeo 16 units daily and if sugar still stays >150 go to 18 units

## 2016-12-09 ENCOUNTER — Other Ambulatory Visit: Payer: Self-pay | Admitting: Endocrinology

## 2016-12-09 NOTE — Telephone Encounter (Signed)
Routing...

## 2016-12-20 ENCOUNTER — Other Ambulatory Visit: Payer: Self-pay | Admitting: Family Medicine

## 2016-12-28 ENCOUNTER — Other Ambulatory Visit: Payer: Self-pay | Admitting: Family Medicine

## 2016-12-28 DIAGNOSIS — E785 Hyperlipidemia, unspecified: Secondary | ICD-10-CM

## 2017-01-04 ENCOUNTER — Other Ambulatory Visit: Payer: Self-pay | Admitting: Family Medicine

## 2017-01-06 ENCOUNTER — Other Ambulatory Visit: Payer: Self-pay

## 2017-01-07 ENCOUNTER — Other Ambulatory Visit: Payer: Self-pay | Admitting: Endocrinology

## 2017-01-10 ENCOUNTER — Ambulatory Visit: Payer: Self-pay | Admitting: Endocrinology

## 2017-01-28 ENCOUNTER — Other Ambulatory Visit: Payer: Self-pay | Admitting: Family Medicine

## 2017-01-28 DIAGNOSIS — E785 Hyperlipidemia, unspecified: Secondary | ICD-10-CM

## 2017-02-01 ENCOUNTER — Other Ambulatory Visit: Payer: Self-pay | Admitting: Family Medicine

## 2017-02-07 ENCOUNTER — Other Ambulatory Visit: Payer: Self-pay | Admitting: General Surgery

## 2017-02-07 ENCOUNTER — Other Ambulatory Visit: Payer: Self-pay | Admitting: Family Medicine

## 2017-02-07 DIAGNOSIS — K432 Incisional hernia without obstruction or gangrene: Secondary | ICD-10-CM

## 2017-02-07 DIAGNOSIS — K2270 Barrett's esophagus without dysplasia: Secondary | ICD-10-CM

## 2017-02-08 DIAGNOSIS — Z8639 Personal history of other endocrine, nutritional and metabolic disease: Secondary | ICD-10-CM

## 2017-02-08 HISTORY — DX: Personal history of other endocrine, nutritional and metabolic disease: Z86.39

## 2017-02-11 ENCOUNTER — Telehealth: Payer: Self-pay | Admitting: Family Medicine

## 2017-02-11 NOTE — Telephone Encounter (Signed)
Request received for preoperative clearance from [facility] surgery. It appears he has an appointment on December 10, can discuss preoperative clearance at that time. Paperwork will remain in the chart box.

## 2017-02-12 ENCOUNTER — Other Ambulatory Visit: Payer: Self-pay | Admitting: Endocrinology

## 2017-02-13 ENCOUNTER — Ambulatory Visit: Payer: Medicare Other | Admitting: Family Medicine

## 2017-02-13 ENCOUNTER — Ambulatory Visit
Admission: RE | Admit: 2017-02-13 | Discharge: 2017-02-13 | Disposition: A | Discharge status: Home / Self Care | Payer: Medicare Other | Source: Ambulatory Visit | Attending: General Surgery | Admitting: General Surgery

## 2017-02-13 DIAGNOSIS — K432 Incisional hernia without obstruction or gangrene: Secondary | ICD-10-CM

## 2017-02-13 MED ORDER — IOPAMIDOL (ISOVUE-300) INJECTION 61%
100.0000 mL | Freq: Once | INTRAVENOUS | Status: AC | PRN
  Administered 2017-02-13: 50 mL via INTRAVENOUS

## 2017-02-17 ENCOUNTER — Ambulatory Visit: Payer: Medicare Other | Admitting: Family Medicine

## 2017-02-21 ENCOUNTER — Encounter: Payer: Self-pay | Admitting: Family Medicine

## 2017-02-21 ENCOUNTER — Ambulatory Visit: Payer: Medicare Other | Admitting: Family Medicine

## 2017-02-21 ENCOUNTER — Ambulatory Visit (INDEPENDENT_AMBULATORY_CARE_PROVIDER_SITE_OTHER): Payer: Medicare Other

## 2017-02-21 ENCOUNTER — Other Ambulatory Visit: Payer: Self-pay

## 2017-02-21 VITALS — BP 132/84 | HR 68 | Temp 97.7°F | Resp 18 | Ht 65.0 in | Wt 179.8 lb

## 2017-02-21 DIAGNOSIS — Z794 Long term (current) use of insulin: Secondary | ICD-10-CM

## 2017-02-21 DIAGNOSIS — Z01818 Encounter for other preprocedural examination: Secondary | ICD-10-CM | POA: Diagnosis not present

## 2017-02-21 DIAGNOSIS — Z8719 Personal history of other diseases of the digestive system: Secondary | ICD-10-CM

## 2017-02-21 DIAGNOSIS — Z130 Encounter for screening for diseases of the blood and blood-forming organs and certain disorders involving the immune mechanism: Secondary | ICD-10-CM | POA: Diagnosis not present

## 2017-02-21 DIAGNOSIS — E875 Hyperkalemia: Secondary | ICD-10-CM

## 2017-02-21 DIAGNOSIS — Z136 Encounter for screening for cardiovascular disorders: Secondary | ICD-10-CM

## 2017-02-21 DIAGNOSIS — R9431 Abnormal electrocardiogram [ECG] [EKG]: Secondary | ICD-10-CM

## 2017-02-21 DIAGNOSIS — I517 Cardiomegaly: Secondary | ICD-10-CM

## 2017-02-21 DIAGNOSIS — E1122 Type 2 diabetes mellitus with diabetic chronic kidney disease: Secondary | ICD-10-CM

## 2017-02-21 HISTORY — DX: Cardiomegaly: I51.7

## 2017-02-21 NOTE — Progress Notes (Addendum)
Subjective:  By signing my name below, I, [name], attest that this documentation has been prepared under the direction and in the presence of [name], MD Electronically Signed: Ladene Artist, ED Scribe 02/21/2017 at 2:01 PM.   Patient ID: Zachary Cook, male    DOB: 1932-06-04, 83 y.o.   MRN: [id number]  Chief Complaint  Patient presents with  . Surgical Clearance    patient presents to office for surgical clearance, patient states appointment was set up by Dr Dalbert Batman, unsure of what he needs   HPI Zachary Cook is a 83 y.o. male who presents to Primary Care at [facility] for a pre-operative clearance. Followed by Dr. Dalbert Batman at [facility]. Planning on ventral hernia repair, open repair with mesh. H/o multiple medical problems including TIIDM, HTN, GERD, diverticulosis, GI bleed, open R hemicolectomy 3/29, hypothyroidism.   Pt denies any issues with anesthesia from previous surgery.   DM Followed by Dr. Dwyane Dee. Previously uncontrolled. On Insulin with Toujeo. There was some concern with consistency of meds.   Hypothyroidism  Last TSH elevated at 20.9. Hyperkalemia with potassium of 5.4 in Sept. Notes per endocrine regarding possible accidental use of Lisinopril as he was supposed to be on Amlodipine only for HTN. Creatinine was 1.68 with egfr 59. Glucose of 182. Last A1C in August 6.2. Recent fructosamine was elevated 9/24. There was some concern about mental status at last endocrinology appointment as there was concern about him following or remembering instructions. Pt states that he has a 7 day compartmental pill box. Does report that he has some short term memory which includes forgetting why he walked into a room. Denies difficulty balancing finances or with sense of direction. Pt lives with his wife.  Pt walks and lifts weights for exercise. States he is unable to walk up a large hill but he has recently been carrying limbs from a large tree that fell  on his property. (4-6 mets of activity). He reports some fatigue with this but denies cough, cp, chest tightness, sob, blood in stool. Denies h/o CHF, heart disease, stroke, mini stroke, sleep apnea. Pt is a nonsmoker. RCRI index: 2 points with insulin use and abdominal surgery.  Patient Active Problem List   Diagnosis Date Noted  . Malnutrition of moderate degree 06/13/2016  . Ileus following gastrointestinal surgery 06/12/2016  . GI bleed 06/03/2016  . Syncope   . Diverticulosis of colon with hemorrhage   . Hematochezia   . Lower gastrointestinal bleeding   . BRBPR (bright red blood per rectum) 05/27/2016  . Acute blood loss anemia 05/27/2016  . Hypotension due to blood loss 05/27/2016  . AKI (acute kidney injury) ([place]) 05/27/2016  . Hyperkalemia 05/27/2016  . Adult onset hypothyroidism 12/20/2013  . Type II diabetes mellitus, uncontrolled ([place]) 12/20/2013  . Hypogonadism in male 12/02/2013  . Type 2 diabetes mellitus ([place]) 05/09/2011  . Hypertension 05/09/2011  . GERD (gastroesophageal reflux disease) 05/09/2011  . Hypogonadism male 05/09/2011   Past Medical History:  Diagnosis Date  . Barrett's esophagus   . Diabetes mellitus without complication ([place])   . Diverticulosis   . Duodenitis   . GERD (gastroesophageal reflux disease)   . Hiatal hernia    Past Surgical History:  Procedure Laterality Date  . IR GENERIC HISTORICAL  05/28/2016   IR ANGIOGRAM SELECTIVE EACH ADDITIONAL VESSEL 05/28/2016 Aletta Edouard, MD [facility]  . IR GENERIC HISTORICAL  05/28/2016   IR ANGIOGRAM VISCERAL SELECTIVE 05/28/2016 Aletta Edouard, MD [facility]  . IR GENERIC HISTORICAL  05/28/2016   IR US GUIDE VASC ACCESS RIGHT 05/28/2016 Aletta Edouard, MD [facility]  . IR GENERIC HISTORICAL  05/28/2016   IR ANGIOGRAM VISCERAL SELECTIVE 05/28/2016 Arne Cleveland, MD [facility]  . IR GENERIC HISTORICAL  05/28/2016   IR ANGIOGRAM SELECTIVE EACH ADDITIONAL VESSEL 05/28/2016 Arne Cleveland, MD  [facility]  . IR GENERIC HISTORICAL  05/28/2016   IR EMBO ART  VEN HEMORR LYMPH EXTRAV  INC GUIDE ROADMAPPING 05/28/2016 Arne Cleveland, MD [facility]  . IR GENERIC HISTORICAL  05/28/2016   IR US GUIDE VASC ACCESS RIGHT 05/28/2016 Arne Cleveland, MD [facility]  . PARTIAL COLECTOMY N/A 06/06/2016   Procedure: OPEN ASCENDING COLECTOMY;  Surgeon: Stark Klein, MD;  Location: [facility];  Service: General;  Laterality: N/A;  . VASECTOMY     No Known Allergies Prior to Admission medications   Medication Sig Start Date End Date Taking? Authorizing Provider  ACCU-CHEK SMARTVIEW test strip USE TO TEST BLOOD SUGAR TWICE DAILY 10/11/16  Yes Elayne Snare, MD  amLODipine (NORVASC) 5 MG tablet TAKE 1 TABLET(5 MG) BY MOUTH DAILY 12/23/16  Yes [name], MD  aspirin 81 MG tablet Take 81 mg by mouth daily.   Yes [provider]  B-D UF III MINI PEN NEEDLES 31G X 5 MM MISC USE ONE PER DAY WITH VICTOZA 02/12/17  Yes Elayne Snare, MD  Blood Glucose Monitoring Suppl (ACCU-CHEK NANO SMARTVIEW) W/DEVICE KIT  12/02/13  Yes [provider]  esomeprazole (NEXIUM) 40 MG capsule TAKE ONE CAPSULE BY MOUTH DAILY AT NOON 02/07/17  Yes [name], MD  ferrous sulfate 325 (65 FE) MG tablet Take 1 tablet (325 mg total) by mouth 3 (three) times daily with meals. Patient taking differently: Take 325 mg by mouth daily.  05/31/16  Yes Patrecia Pour, Christean Grief, MD  FLUAD 0.5 ML SUSY  11/30/15  Yes [provider]  glimepiride (AMARYL) 2 MG tablet TAKE 2 TABLETS(4 MG) BY MOUTH DAILY BEFORE DINNER 01/07/17  Yes Elayne Snare, MD  levothyroxine (SYNTHROID, LEVOTHROID) 125 MCG tablet Take 1 tablet (125 mcg total) by mouth daily. 10/18/16  Yes Philemon Kingdom, MD  metFORMIN (GLUCOPHAGE-XR) 750 MG 24 hr tablet TAKE 2 TABLETS(1500 MG) BY MOUTH DAILY WITH BREAKFAST 12/09/16  Yes Elayne Snare, MD  simvastatin (ZOCOR) 40 MG tablet TAKE 1 TABLET BY MOUTH AT BEDTIME 01/28/17  Yes [name], MD  tamsulosin  (FLOMAX) 0.4 MG CAPS capsule TAKE ONE CAPSULE BY MOUTH DAILY 02/03/17  Yes [name], MD  TOUJEO SOLOSTAR 300 UNIT/ML SOPN INJECT 12 UNITS INTO THE SKIN DAILY 11/09/16  Yes Elayne Snare, MD  VICTOZA 18 MG/3ML SOPN ADMINISTER 1.2 MG UNDER THE SKIN DAILY 01/07/17  Yes Elayne Snare, MD   Social History   Socioeconomic History  . Marital status: Married    Spouse name: Not on file  . Number of children: Not on file  . Years of education: Not on file  . Highest education level: Not on file  Social Needs  . Financial resource strain: Not on file  . Food insecurity - worry: Not on file  . Food insecurity - inability: Not on file  . Transportation needs - medical: Not on file  . Transportation needs - non-medical: Not on file  Occupational History  . Not on file  Tobacco Use  . Smoking status: Never Smoker  . Smokeless tobacco: Never Used  Substance and Sexual Activity  . Alcohol use: No  Alcohol/week: 0.0 oz  . Drug use: No  . Sexual activity: Not on file  Other Topics Concern  . Not on file  Social History Narrative  . Not on file   Review of Systems  Respiratory: Negative for cough, chest tightness and shortness of breath.   Cardiovascular: Negative for chest pain.  Gastrointestinal: Negative for blood in stool.      Objective:   Physical Exam  Constitutional: He is oriented to person, place, and time. He appears well-developed and well-nourished.  HENT:  Head: Normocephalic and atraumatic.  Eyes: EOM are normal. Pupils are equal, round, and reactive to light.  Neck: No JVD present. Carotid bruit is not present.  Cardiovascular: Normal rate, regular rhythm and normal heart sounds. Exam reveals no gallop and no friction rub.  No murmur heard. No third heart sound.  Pulmonary/Chest: Effort normal and breath sounds normal. He has no rales.  Abdominal:  Suspected abdominal wall defect below scar. Nontender.  Musculoskeletal: He exhibits no edema.  Neurological: He is  alert and oriented to person, place, and time.  Skin: Skin is warm and dry.  Psychiatric: He has a normal mood and affect.  Vitals reviewed.  Vitals:   02/21/17 1321  BP: 132/84  Pulse: 68  Resp: 18  Temp: 97.7 F (36.5 C)  TempSrc: Oral  SpO2: 97%  Weight: 179 lb 12.8 oz (81.6 kg)  Height: _0  (1.651 m)   6CIT Screen 02/21/2017 11/07/2016  What Year? 0 points 0 points  What month? 0 points 0 points  What time? 0 points 0 points  Count back from 20 0 points 0 points  Months in reverse 0 points 0 points  Repeat phrase 0 points 0 points  Total Score 0 0   EKG reading done by [name], MD: sinus rhythm frequent PACs. LAFB possible old inferior infarct Q in III. Similar to previous readings.     Assessment & Plan:  Zachary Cook is a 83 y.o. male Preoperative clearance - Plan: EKG 12-Lead, DG Chest 2 View, Ambulatory referral to Cardiology  - labs pending. Hx of hypokalemia, and prior GI bleed - BMP, CBC.  Reports about 4-6 mets of activity with yardwork with some fatigue, but no dyspnea or chest pain. EKG with some possible abnormal findings, and it indicates possible prior inferior MI, but no known heart disease. No appreciable change form prior, but willl have cardiology eval for cardiac clearance.   History of gastrointestinal bleeding - Plan: CBC  Screening, anemia, deficiency, iron - Plan: CBC  Hyperkalemia - Plan: Basic metabolic panel  Type 2 diabetes mellitus with chronic kidney disease, with long-term current use of insulin, unspecified CKD stage ([place]) - Plan: Basic metabolic panel  - followed by endocrinology, check glucose on BMP.   Nonspecific abnormal electrocardiogram (ECG) (EKG) - Plan: Ambulatory referral to Cardiology Screening for heart disease - Plan: EKG 12-Lead  - as above, will refer to cardiology, RTC/ER precautions if symptomatic.   Note reviewed with memory concerns from endocrine. Screening normal, will have him return as scheduled  next week to review meds.   No orders of the defined types were placed in this encounter.  Patient Instructions   Please return with your home medications to make sure you are taking the correct medications. Using a pill box is a good idea. keep upcoming appointment  I will check some blood work for surgery evaluation.  Once I have those results, I will be able to complete your  paperwork for the surgeon. I will refer you to cardiology for the abnormal EKG to determine if other testing needed.   If you or family notice any change in your memory, please return to discuss other memory testing.     IF you received an x-ray today, you will receive an invoice from [facility]. Please contact [facility] Radiology at [phone] with questions or concerns regarding your invoice.   IF you received labwork today, you will receive an invoice from [place]. Please contact [facility] at [phone] with questions or concerns regarding your invoice.   Our billing staff will not be able to assist you with questions regarding bills from these companies.  You will be contacted with the lab results as soon as they are available. The fastest way to get your results is to activate your My Chart account. Instructions are located on the last page of this paperwork. If you have not heard from [place] regarding the results in 2 weeks, please contact this office.     ,I personally performed the services described in this documentation, which was scribed in my presence. The recorded information has been reviewed and considered for accuracy and completeness, addended by me as needed, and agree with information above.  Signed,   Merri Ray, MD Primary Care at [place].  02/22/17 10:00 PM   03/17/17 addendum 7:59 PM Letter received from cardiology from Dr. Gwenlyn Found dated January 4. Patient is at low risk from a cardiac standpoint for upcoming procedure, okay to proceed without  further cardiac testing. We'll complete letter for surgeon with cautions on contrast and renal function, as well as perioperative monitoring of CBC, potassium level with mild hyperkalemia, and close monitoring of creatinine with most recent level of 1.56 with estimated GFR of 41 in December.

## 2017-02-21 NOTE — Patient Instructions (Addendum)
Please return with your home medications to make sure you are taking the correct medications. Using a pill box is a good idea. keep upcoming appointment  I will check some blood work for surgery evaluation.  Once I have those results, I will be able to complete your paperwork for the surgeon. I will refer you to cardiology for the abnormal EKG to determine if other testing needed.   If you or family notice any change in your memory, please return to discuss other memory testing.     IF you received an x-ray today, you will receive an invoice from [facility] Radiology. Please contact [facility] Radiology at [phone] with questions or concerns regarding your invoice.   IF you received labwork today, you will receive an invoice from [place]. Please contact [facility] at [phone] with questions or concerns regarding your invoice.   Our billing staff will not be able to assist you with questions regarding bills from these companies.  You will be contacted with the lab results as soon as they are available. The fastest way to get your results is to activate your My Chart account. Instructions are located on the last page of this paperwork. If you have not heard from [place] regarding the results in 2 weeks, please contact this office.

## 2017-02-22 LAB — BASIC METABOLIC PANEL
BUN/Creatinine Ratio: 18 (ref 10–24)
BUN: 28 mg/dL — ABNORMAL HIGH (ref 8–27)
CO2: 23 mmol/L (ref 20–29)
CREATININE: 1.56 mg/dL — AB (ref 0.76–1.27)
Calcium: 9.9 mg/dL (ref 8.6–10.2)
Chloride: 105 mmol/L (ref 96–106)
GFR, EST AFRICAN AMERICAN: 47 mL/min/{1.73_m2} — AB (ref >59)
GFR, EST NON AFRICAN AMERICAN: 41 mL/min/{1.73_m2} — AB (ref >59)
Glucose: 177 mg/dL — ABNORMAL HIGH (ref 65–99)
POTASSIUM: 5.5 mmol/L — AB (ref 3.5–5.2)
SODIUM: 141 mmol/L (ref 134–144)

## 2017-02-22 LAB — CBC
Hematocrit: 40.3 % (ref 37.5–51.0)
Hemoglobin: 13.8 g/dL (ref 13.0–17.7)
MCH: 29.9 pg (ref 26.6–33.0)
MCHC: 34.2 g/dL (ref 31.5–35.7)
MCV: 87 fL (ref 79–97)
PLATELETS: 217 10*3/uL (ref 150–379)
RBC: 4.61 x10E6/uL (ref 4.14–5.80)
RDW: 14.6 % (ref 12.3–15.4)
WBC: 10.4 10*3/uL (ref 3.4–10.8)

## 2017-02-24 ENCOUNTER — Encounter: Payer: Self-pay | Admitting: Family Medicine

## 2017-02-24 ENCOUNTER — Ambulatory Visit: Payer: Medicare Other | Admitting: Family Medicine

## 2017-02-24 ENCOUNTER — Other Ambulatory Visit: Payer: Self-pay

## 2017-02-24 VITALS — BP 128/70 | HR 72 | Temp 98.6°F | Resp 18 | Ht 65.0 in | Wt 182.4 lb

## 2017-02-24 DIAGNOSIS — R7989 Other specified abnormal findings of blood chemistry: Secondary | ICD-10-CM | POA: Diagnosis not present

## 2017-02-24 DIAGNOSIS — E875 Hyperkalemia: Secondary | ICD-10-CM

## 2017-02-24 NOTE — Patient Instructions (Addendum)
  I will refer you to nephrology to discuss elevated potassium and kidney test.    Keep follow up with cardiologist to look at your EKG to see if other testing needed before surgery.   Keep follow up with Dr. Dwyane Dee as planned.    IF you received an x-ray today, you will receive an invoice from [facility] Radiology. Please contact [facility] Radiology at [phone] with questions or concerns regarding your invoice.   IF you received labwork today, you will receive an invoice from [place]. Please contact [facility] at [phone] with questions or concerns regarding your invoice.   Our billing staff will not be able to assist you with questions regarding bills from these companies.  You will be contacted with the lab results as soon as they are available. The fastest way to get your results is to activate your My Chart account. Instructions are located on the last page of this paperwork. If you have not heard from [place] regarding the results in 2 weeks, please contact this office.

## 2017-02-24 NOTE — Progress Notes (Signed)
Subjective:  By signing my name below, I, [name], attest that this documentation has been prepared under the direction and in the presence of [name], MD Electronically Signed: Ladene Artist, ED Scribe 02/24/2017 at 3:03 PM.   Patient ID: Zachary Cook, male    DOB: 10/28/1932, 83 y.o.   MRN: [id number]  Chief Complaint  Patient presents with  . Follow-up    from friday    HPI Zachary Cook is a 83 y.o. male who presents to Primary Care at [facility] for f/u. Seen 3 days ago for pre-operative clearance. Screening lab work was performed. Also referred to cardiology due to abnormal EKG for cardiac clearance. There was some concern regarding home med regimen. Asked to return to discuss further with home meds. Mental status screening was reassuring last visit. Specifically whether or not he had been taking Lisinopril or not.  Hyperkalemia 5.4 on 9/20. Had been reccommended a low potassium diet and was supposed to be off Lisinopril. Creatinine was 1.69 on 9/20. There was some discussion of nephrology eval if potassium still elevated. Lab Results  Component Value Date   CREATININE 1.56 (H) 02/21/2017  Pt states he has not changed his diet since Sept as recommended. He has an upcoming appointment with Dr. Dwyane Dee within the next 2 weeks. Denies cp, palpitations.   Patient Active Problem List   Diagnosis Date Noted  . Malnutrition of moderate degree 06/13/2016  . Ileus following gastrointestinal surgery 06/12/2016  . GI bleed 06/03/2016  . Syncope   . Diverticulosis of colon with hemorrhage   . Hematochezia   . Lower gastrointestinal bleeding   . BRBPR (bright red blood per rectum) 05/27/2016  . Acute blood loss anemia 05/27/2016  . Hypotension due to blood loss 05/27/2016  . AKI (acute kidney injury) ([place]) 05/27/2016  . Hyperkalemia 05/27/2016  . Adult onset hypothyroidism 12/20/2013  . Type II diabetes mellitus, uncontrolled ([place]) 12/20/2013  . Hypogonadism in  male 12/02/2013  . Type 2 diabetes mellitus ([place]) 05/09/2011  . Hypertension 05/09/2011  . GERD (gastroesophageal reflux disease) 05/09/2011  . Hypogonadism male 05/09/2011   Past Medical History:  Diagnosis Date  . Barrett's esophagus   . Diabetes mellitus without complication ([place])   . Diverticulosis   . Duodenitis   . GERD (gastroesophageal reflux disease)   . Hiatal hernia    Past Surgical History:  Procedure Laterality Date  . IR GENERIC HISTORICAL  05/28/2016   IR ANGIOGRAM SELECTIVE EACH ADDITIONAL VESSEL 05/28/2016 Aletta Edouard, MD [facility]  . IR GENERIC HISTORICAL  05/28/2016   IR ANGIOGRAM VISCERAL SELECTIVE 05/28/2016 Aletta Edouard, MD [facility]  . IR GENERIC HISTORICAL  05/28/2016   IR US GUIDE VASC ACCESS RIGHT 05/28/2016 Aletta Edouard, MD [facility]  . IR GENERIC HISTORICAL  05/28/2016   IR ANGIOGRAM VISCERAL SELECTIVE 05/28/2016 Arne Cleveland, MD [facility]  . IR GENERIC HISTORICAL  05/28/2016   IR ANGIOGRAM SELECTIVE EACH ADDITIONAL VESSEL 05/28/2016 Arne Cleveland, MD [facility]  . IR GENERIC HISTORICAL  05/28/2016   IR EMBO ART  VEN HEMORR LYMPH EXTRAV  INC GUIDE ROADMAPPING 05/28/2016 Arne Cleveland, MD [facility]  . IR GENERIC HISTORICAL  05/28/2016   IR US GUIDE VASC ACCESS RIGHT 05/28/2016 Arne Cleveland, MD [facility]  . PARTIAL COLECTOMY N/A 06/06/2016   Procedure: OPEN ASCENDING COLECTOMY;  Surgeon: Stark Klein, MD;  Location: [facility];  Service: General;  Laterality: N/A;  . VASECTOMY     No Known Allergies Prior  to Admission medications   Medication Sig Start Date End Date Taking? Authorizing Provider  ACCU-CHEK SMARTVIEW test strip USE TO TEST BLOOD SUGAR TWICE DAILY 10/11/16   Elayne Snare, MD  amLODipine (NORVASC) 5 MG tablet TAKE 1 TABLET(5 MG) BY MOUTH DAILY 12/23/16   [name], MD  aspirin 81 MG tablet Take 81 mg by mouth daily.    [provider]  B-D UF III MINI PEN NEEDLES 31G X 5 MM MISC USE ONE PER DAY WITH  VICTOZA 02/12/17   Elayne Snare, MD  Blood Glucose Monitoring Suppl (ACCU-CHEK NANO SMARTVIEW) W/DEVICE KIT  12/02/13   [provider]  esomeprazole (NEXIUM) 40 MG capsule TAKE ONE CAPSULE BY MOUTH DAILY AT NOON 02/07/17   [name], MD  ferrous sulfate 325 (65 FE) MG tablet Take 1 tablet (325 mg total) by mouth 3 (three) times daily with meals. Patient taking differently: Take 325 mg by mouth daily.  05/31/16   Doreatha Lew, MD  FLUAD 0.5 ML SUSY  11/30/15   [provider]  glimepiride (AMARYL) 2 MG tablet TAKE 2 TABLETS(4 MG) BY MOUTH DAILY BEFORE DINNER 01/07/17   Elayne Snare, MD  levothyroxine (SYNTHROID, LEVOTHROID) 125 MCG tablet Take 1 tablet (125 mcg total) by mouth daily. 10/18/16   Philemon Kingdom, MD  metFORMIN (GLUCOPHAGE-XR) 750 MG 24 hr tablet TAKE 2 TABLETS(1500 MG) BY MOUTH DAILY WITH BREAKFAST 12/09/16   Elayne Snare, MD  simvastatin (ZOCOR) 40 MG tablet TAKE 1 TABLET BY MOUTH AT BEDTIME 01/28/17   [name], MD  tamsulosin (FLOMAX) 0.4 MG CAPS capsule TAKE ONE CAPSULE BY MOUTH DAILY 02/03/17   [name], MD  TOUJEO SOLOSTAR 300 UNIT/ML SOPN INJECT 12 UNITS INTO THE SKIN DAILY 11/09/16   Elayne Snare, MD  VICTOZA 18 MG/3ML SOPN ADMINISTER 1.2 MG UNDER THE SKIN DAILY 01/07/17   Elayne Snare, MD   Social History   Socioeconomic History  . Marital status: Married    Spouse name: Not on file  . Number of children: Not on file  . Years of education: Not on file  . Highest education level: Not on file  Social Needs  . Financial resource strain: Not on file  . Food insecurity - worry: Not on file  . Food insecurity - inability: Not on file  . Transportation needs - medical: Not on file  . Transportation needs - non-medical: Not on file  Occupational History  . Not on file  Tobacco Use  . Smoking status: Never Smoker  . Smokeless tobacco: Never Used  Substance and Sexual Activity  . Alcohol use: No    Alcohol/week: 0.0 oz  . Drug  use: No  . Sexual activity: Not on file  Other Topics Concern  . Not on file  Social History Narrative  . Not on file   Review of Systems  Cardiovascular: Negative for chest pain and palpitations.      Objective:   Physical Exam  Constitutional: He is oriented to person, place, and time. He appears well-developed and well-nourished. No distress.  HENT:  Head: Normocephalic and atraumatic.  Eyes: Conjunctivae and EOM are normal.  Neck: Neck supple. No tracheal deviation present.  Cardiovascular: Normal rate.  Pulmonary/Chest: Effort normal. No respiratory distress.  Musculoskeletal: Normal range of motion.  Neurological: He is alert and oriented to person, place, and time.  Skin: Skin is warm and dry.  Psychiatric: He has a normal mood and affect. His behavior is normal.  Nursing note and vitals reviewed.  Vitals:   02/24/17 1428  BP: 128/70  Pulse: 72  Resp: 18  Temp: 98.6 F (37 C)  TempSrc: Oral  SpO2: 95%  Weight: 182 lb 6.4 oz (82.7 kg)  Height: '5\' 5"'$  (1.651 m)      Assessment & Plan:  Zachary Cook is a 83 y.o. male Elevated serum creatinine - Plan: Ambulatory referral to Nephrology  Hyperkalemia - Plan: Ambulatory referral to Nephrology  - medications were reviewed with home supply. Not on lisinopril. Will refer to nephrology for further workup of elevated creatinine and hyperkalemia, follow up with endocrine as planned.   No orders of the defined types were placed in this encounter.  Patient Instructions    I will refer you to nephrology to discuss elevated potassium and kidney test.    Keep follow up with cardiologist to look at your EKG to see if other testing needed before surgery.   Keep follow up with Dr. Dwyane Dee as planned.    IF you received an x-ray today, you will receive an invoice from [facility] Radiology. Please contact [facility] Radiology at [phone] with questions or concerns regarding your invoice.   IF you received labwork  today, you will receive an invoice from [place]. Please contact [facility] at [phone] with questions or concerns regarding your invoice.   Our billing staff will not be able to assist you with questions regarding bills from these companies.  You will be contacted with the lab results as soon as they are available. The fastest way to get your results is to activate your My Chart account. Instructions are located on the last page of this paperwork. If you have not heard from [place] regarding the results in 2 weeks, please contact this office.      I personally performed the services described in this documentation, which was scribed in my presence. The recorded information has been reviewed and considered for accuracy and completeness, addended by me as needed, and agree with information above.  Signed,   Merri Ray, MD Primary Care at [place].  02/26/17 9:47 PM

## 2017-02-26 ENCOUNTER — Encounter: Payer: Self-pay | Admitting: Family Medicine

## 2017-02-26 ENCOUNTER — Other Ambulatory Visit: Payer: Self-pay | Admitting: Endocrinology

## 2017-02-26 ENCOUNTER — Other Ambulatory Visit: Payer: Self-pay | Admitting: Family Medicine

## 2017-02-27 ENCOUNTER — Telehealth: Payer: Self-pay | Admitting: Family Medicine

## 2017-02-27 NOTE — Telephone Encounter (Signed)
Copied from [place]. Topic: Quick Communication - See Telephone Encounter >> Feb 27, 2017  4:07 PM Corie Chiquito, [place] wrote: CRM for notification. See Telephone encounter for: [facility] Called because they need to know if the patient was cleared to have surgery. I have given them the fax number again to the office because they are going to re fax the paper work over to the office. If someone could give them a call back at [phone]  02/27/17.

## 2017-02-28 ENCOUNTER — Other Ambulatory Visit: Payer: Self-pay | Admitting: Endocrinology

## 2017-02-28 ENCOUNTER — Other Ambulatory Visit: Payer: Self-pay | Admitting: Family Medicine

## 2017-02-28 DIAGNOSIS — E785 Hyperlipidemia, unspecified: Secondary | ICD-10-CM

## 2017-03-06 ENCOUNTER — Other Ambulatory Visit: Payer: Self-pay | Admitting: Family Medicine

## 2017-03-07 NOTE — Telephone Encounter (Signed)
Have you filled out a form for this pt, please advise

## 2017-03-09 NOTE — Telephone Encounter (Signed)
See notes from December 14th. I can fill out the medical portion of his surgical clearance, but he will need to have cardiology complete the cardiac clearance given the possible abnormal EKG.

## 2017-03-12 NOTE — Telephone Encounter (Signed)
Cardiology referral sent to [facility] on [address] on 12/14. They have not yet scheduled the pt per Epic so I called the pt to confirm and he said no one has called him. I gave him their phone number and advised that he call them to schedule since he has not yet been called. I told pt to call back if any issues. Thanks!

## 2017-03-12 NOTE — Telephone Encounter (Signed)
Forwarded to Referrals for follow up on cardiology referral

## 2017-03-14 ENCOUNTER — Encounter: Payer: Self-pay | Admitting: Cardiovascular Disease

## 2017-03-14 ENCOUNTER — Ambulatory Visit: Payer: Medicare Other | Admitting: Cardiovascular Disease

## 2017-03-14 DIAGNOSIS — E7800 Pure hypercholesterolemia, unspecified: Secondary | ICD-10-CM | POA: Diagnosis not present

## 2017-03-14 DIAGNOSIS — Z01818 Encounter for other preprocedural examination: Secondary | ICD-10-CM

## 2017-03-14 DIAGNOSIS — E785 Hyperlipidemia, unspecified: Secondary | ICD-10-CM | POA: Insufficient documentation

## 2017-03-14 DIAGNOSIS — I10 Essential (primary) hypertension: Secondary | ICD-10-CM | POA: Diagnosis not present

## 2017-03-14 NOTE — Progress Notes (Signed)
03/14/2017 Zachary Cook   1932/07/19  [id number]  Primary Physician [name], MD Primary Cardiologist: Lorretta Harp MD [name], [place]  HPI:  Zachary Cook is a 84 y.o. appearing married Caucasian male father of one son Zachary Cook who is a patient of mine as well and who has a patient's service dog) who is referred by Dr. Janeann Forehand for preoperative clearance before ventral hernia repair. His cardiovascular risk factor profile is remarkable for treated hypertension, diabetes and hyperlipidemia. 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 He's never had a heart attack or stroke. He denies chest pain or shortness of breath. Apparently he had emergency colon surgery recently which resulted in a ventral hernia which needs to be repaired.   Current Meds  Medication Sig  . ACCU-CHEK SMARTVIEW test strip USE TO TEST BLOOD SUGAR TWICE DAILY  . amLODipine (NORVASC) 5 MG tablet TAKE 1 TABLET(5 MG) BY MOUTH DAILY  . aspirin 81 MG tablet Take 81 mg by mouth daily.  . B-D UF III MINI PEN NEEDLES 31G X 5 MM MISC USE ONE PER DAY WITH VICTOZA  . Blood Glucose Monitoring Suppl (ACCU-CHEK NANO SMARTVIEW) W/DEVICE KIT   . esomeprazole (NEXIUM) 40 MG capsule TAKE ONE CAPSULE BY MOUTH DAILY AT NOON  . ferrous sulfate 325 (65 FE) MG tablet Take 1 tablet (325 mg total) by mouth 3 (three) times daily with meals.  Marland Kitchen FLUAD 0.5 ML SUSY   . glimepiride (AMARYL) 2 MG tablet TAKE 2 TABLETS(4 MG) BY MOUTH DAILY BEFORE DINNER  . levothyroxine (SYNTHROID, LEVOTHROID) 112 MCG tablet   . levothyroxine (SYNTHROID, LEVOTHROID) 112 MCG tablet TAKE 1 TABLET(112 MCG) BY MOUTH DAILY BEFORE BREAKFAST  . levothyroxine (SYNTHROID, LEVOTHROID) 125 MCG tablet Take 1 tablet (125 mcg total) by mouth daily.  . metFORMIN (GLUCOPHAGE-XR) 750 MG 24 hr tablet TAKE 2 TABLETS(1500 MG) BY MOUTH DAILY WITH BREAKFAST  . Multiple Vitamins-Minerals (MULTIVITAMIN ADULT)  TABS Take by mouth daily.  . Omega-3 Fatty Acids (FISH OIL) 1200 MG CAPS Take by mouth daily.  . simvastatin (ZOCOR) 40 MG tablet TAKE 1 TABLET BY MOUTH AT BEDTIME  . tamsulosin (FLOMAX) 0.4 MG CAPS capsule TAKE ONE CAPSULE BY MOUTH DAILY  . TOUJEO SOLOSTAR 300 UNIT/ML SOPN INJECT 12 UNITS INTO THE SKIN DAILY  . VICTOZA 18 MG/3ML SOPN ADMINISTER 1.2 MG UNDER THE SKIN DAILY     No Known Allergies  Social History   Socioeconomic History  . Marital status: Married    Spouse name: Not on file  . Number of children: Not on file  . Years of education: Not on file  . Highest education level: Not on file  Social Needs  . Financial resource strain: Not on file  . Food insecurity - worry: Not on file  . Food insecurity - inability: Not on file  . Transportation needs - medical: Not on file  . Transportation needs - non-medical: Not on file  Occupational History  . Not on file  Tobacco Use  . Smoking status: Never Smoker  . Smokeless tobacco: Never Used  Substance and Sexual Activity  . Alcohol use: No    Alcohol/week: 0.0 oz  . Drug use: No  . Sexual activity: Not on file  Other Topics Concern  . Not on file  Social History Narrative  . Not on file     Review of Systems: General: negative for chills, fever,  night sweats or weight changes.  Cardiovascular: negative for chest pain, dyspnea on exertion, edema, orthopnea, palpitations, paroxysmal nocturnal dyspnea or shortness of breath Dermatological: negative for rash Respiratory: negative for cough or wheezing Urologic: negative for hematuria Abdominal: negative for nausea, vomiting, diarrhea, bright red blood per rectum, melena, or hematemesis Neurologic: negative for visual changes, syncope, or dizziness All other systems reviewed and are otherwise negative except as noted above.    Blood pressure (!) 158/84, pulse (!) 52, height '5\' 5"'$  (1.651 m), weight 183 lb 6.4 oz (83.2 kg), SpO2 94 %.  General appearance: alert and no  distress Neck: no adenopathy, no carotid bruit, no JVD, supple, symmetrical, trachea midline and thyroid not enlarged, symmetric, no tenderness/mass/nodules Lungs: clear to auscultation bilaterally Heart: regular rate and rhythm, S1, S2 normal, no murmur, click, rub or gallop Extremities: extremities normal, atraumatic, no cyanosis or edema Pulses: 2+ and symmetric Skin: Skin color, texture, turgor normal. No rashes or lesions Neurologic: Grossly normal  EKG not performed today  ASSESSMENT AND PLAN:   Hypertension History of essential hypertension blood pressure measured 158/84. He is on amlodipine. Continue current meds at current dosing  Hyperlipidemia History of hyperlipidemia on statin therapy with lipid profile performed 03/08/16 related to cholesterol 158, LDL of 93 and HDL of 40.  Preoperative clearance Mr. Klabunde was referred by Dr. Carlota Raspberry  for preoperative clearance before ventral hernia repair. Apparently Dr. Carlota Raspberry saw something on his EKG which he thought was concerning. In reviewing his EKG he did have sinus arrhythmia with PACs. His risk factors including hypertension, diabetes and hyperlipidemia. Never had a heart attack or stroke and is completely symptomatic. We'll continue him at low risk for his upcoming procedure without the need for functional study.      Lorretta Harp MD FACP,FACC,FAHA, [facility] 03/14/2017 8:56 AM

## 2017-03-14 NOTE — Assessment & Plan Note (Signed)
History of essential hypertension blood pressure measured 158/84. He is on amlodipine. Continue current meds at current dosing

## 2017-03-14 NOTE — Assessment & Plan Note (Signed)
History of hyperlipidemia on statin therapy with lipid profile performed 03/08/16 related to cholesterol 158, LDL of 93 and HDL of 40.

## 2017-03-14 NOTE — Assessment & Plan Note (Signed)
Mr. Zachary Cook was referred by Dr. Carlota Raspberry  for preoperative clearance before ventral hernia repair. Apparently Dr. Carlota Raspberry saw something on his EKG which he thought was concerning. In reviewing his EKG he did have sinus arrhythmia with PACs. His risk factors including hypertension, diabetes and hyperlipidemia. Never had a heart attack or stroke and is completely symptomatic. We'll continue him at low risk for his upcoming procedure without the need for functional study.

## 2017-03-14 NOTE — Patient Instructions (Signed)
Medication Instructions: Your physician recommends that you continue on your current medications as directed. Please refer to the Current Medication list given to you today.   Follow-Up: Your physician recommends that you schedule a follow-up appointment as needed with Dr. Gwenlyn Found.  You have been cleared for surgery at low risk.

## 2017-03-24 ENCOUNTER — Other Ambulatory Visit: Payer: Self-pay | Admitting: Family Medicine

## 2017-03-26 ENCOUNTER — Other Ambulatory Visit: Payer: Self-pay | Admitting: Endocrinology

## 2017-03-26 ENCOUNTER — Other Ambulatory Visit: Payer: Self-pay | Admitting: Family Medicine

## 2017-03-26 DIAGNOSIS — E785 Hyperlipidemia, unspecified: Secondary | ICD-10-CM

## 2017-03-26 NOTE — Telephone Encounter (Signed)
Review for refill- can not find lipids panal in chart

## 2017-04-02 ENCOUNTER — Other Ambulatory Visit: Payer: Self-pay | Admitting: Family Medicine

## 2017-04-03 NOTE — Telephone Encounter (Signed)
I sent in 30 day supply, but I do not see that he has had a lipid panel since December 2017. Please schedule appointment in the next 4 weeks for liver testing, lipid panel, and can discuss simvastatin further at that time. I decreased the dose to 20 mg as currently taking amlodipine as well (some risk with higher dosing of simvastatin combination).

## 2017-04-03 NOTE — Telephone Encounter (Signed)
Last lipids below from 2017:  Lab Results  Component Value Date   CHOL 158 03/08/2016   HDL 40.90 03/08/2016   LDLCALC 93 03/08/2016   LDLDIRECT 125.0 12/14/2014   TRIG 123.0 03/08/2016   CHOLHDL 4 03/08/2016   Seen 02/24/17 for Pre-op clearance. Unable to locate recommended follow up time regarding cholesterol medication. Provider, please refill if appropriate and advise.

## 2017-04-03 NOTE — Telephone Encounter (Signed)
Called pt and had to leave a message for him to call in and schedule an appt with you within the next 4 weeks. I did inform him of the RX called in.

## 2017-04-06 ENCOUNTER — Other Ambulatory Visit: Payer: Self-pay | Admitting: Endocrinology

## 2017-04-10 ENCOUNTER — Other Ambulatory Visit: Payer: Self-pay | Admitting: General Surgery

## 2017-04-14 ENCOUNTER — Ambulatory Visit: Payer: Medicare Other | Admitting: Family Medicine

## 2017-04-14 ENCOUNTER — Other Ambulatory Visit: Payer: Self-pay

## 2017-04-14 ENCOUNTER — Encounter: Payer: Self-pay | Admitting: Family Medicine

## 2017-04-14 VITALS — BP 150/72 | HR 71 | Temp 98.4°F | Resp 18 | Ht 65.0 in | Wt 183.0 lb

## 2017-04-14 DIAGNOSIS — E785 Hyperlipidemia, unspecified: Secondary | ICD-10-CM | POA: Diagnosis not present

## 2017-04-14 LAB — LIPID PANEL
CHOL/HDL RATIO: 4.4 ratio (ref 0.0–5.0)
Cholesterol, Total: 190 mg/dL (ref 100–199)
HDL: 43 mg/dL (ref >39)
LDL CALC: 117 mg/dL — AB (ref 0–99)
TRIGLYCERIDES: 150 mg/dL — AB (ref 0–149)
VLDL Cholesterol Cal: 30 mg/dL (ref 5–40)

## 2017-04-14 LAB — COMPREHENSIVE METABOLIC PANEL
ALBUMIN: 4.3 g/dL (ref 3.5–4.7)
ALK PHOS: 61 IU/L (ref 39–117)
ALT: 11 IU/L (ref 0–44)
AST: 18 IU/L (ref 0–40)
Albumin/Globulin Ratio: 1.5 (ref 1.2–2.2)
BILIRUBIN TOTAL: 0.7 mg/dL (ref 0.0–1.2)
BUN / CREAT RATIO: 15 (ref 10–24)
BUN: 24 mg/dL (ref 8–27)
CO2: 21 mmol/L (ref 20–29)
Calcium: 9.6 mg/dL (ref 8.6–10.2)
Chloride: 104 mmol/L (ref 96–106)
Creatinine, Ser: 1.58 mg/dL — ABNORMAL HIGH (ref 0.76–1.27)
GFR calc non Af Amer: 40 mL/min/{1.73_m2} — ABNORMAL LOW (ref >59)
GFR, EST AFRICAN AMERICAN: 46 mL/min/{1.73_m2} — AB (ref >59)
GLOBULIN, TOTAL: 2.9 g/dL (ref 1.5–4.5)
Glucose: 124 mg/dL — ABNORMAL HIGH (ref 65–99)
Potassium: 4.8 mmol/L (ref 3.5–5.2)
SODIUM: 142 mmol/L (ref 134–144)
TOTAL PROTEIN: 7.2 g/dL (ref 6.0–8.5)

## 2017-04-14 MED ORDER — ATORVASTATIN CALCIUM 10 MG PO TABS: 10.0000 mg | ORAL_TABLET | Freq: Every day | ORAL | 0 refills | Status: DC

## 2017-04-14 NOTE — Patient Instructions (Addendum)
I will check your cholesterol levels today. Return in 6 weeks just for blood work for cholesterol and liver testing.   If any side effects of new medication, or questions - please let me know.   IF you received an x-ray today, you will receive an invoice from [facility] Radiology. Please contact [facility] Radiology at [phone] with questions or concerns regarding your invoice.   IF you received labwork today, you will receive an invoice from [facility]. Please contact [facility] at [phone] with questions or concerns regarding your invoice.   Our billing staff will not be able to assist you with questions regarding bills from these companies.  You will be contacted with the lab results as soon as they are available. The fastest way to get your results is to activate your My Chart account. Instructions are located on the last page of this paperwork. If you have not heard from [place] regarding the results in 2 weeks, please contact this office.

## 2017-04-14 NOTE — Progress Notes (Signed)
Subjective:  By signing my name below, I, [name], attest that this documentation has been prepared under the direction and in the presence of [name], MD Electronically Signed: Ladene Artist, ED Scribe 04/14/2017 at 10:40 AM.   Patient ID: Zachary Cook, male    DOB: 1932/11/30, 84 y.o.   MRN: [id number]  Chief Complaint  Patient presents with  . Medication Refill    discuss simvastatin   . bloodwork   HPI Zachary Cook is a 84 y.o. male who presents to Primary Care at [facility] here to discuss hyperlipidemia. Pt is fasting at this visit. Lab Results  Component Value Date   CHOL 158 03/08/2016   HDL 40.90 03/08/2016   LDLCALC 93 03/08/2016   LDLDIRECT 125.0 12/14/2014   TRIG 123.0 03/08/2016   CHOLHDL 4 03/08/2016   Lab Results  Component Value Date   ALT 26 06/10/2016   AST 48 (H) 06/10/2016   ALKPHOS 51 06/10/2016   BILITOT 0.5 06/10/2016   The ASCVD Risk score Mikey Bussing DC Jr., et al., 2013) failed to calculate for the following reasons:   The 2013 ASCVD risk score is only valid for ages 31 to 82  Previously had taken Zocor 40 mg qd. Recommended decreasing to 20 mg when refilled on 1/24 as he also takes amlodipine and increased risk of myopathy with combo. Does have a h/o DM. Managed by Dr. Dwyane Dee. --- Pt states that he had previously taken Zocor 40 mg qd, but has not taken Zocor since 1/24. Denies reactions, myalgias with statins in the past.  Stopped ferrous sulfate x 6-7 months ago.  Patient Active Problem List   Diagnosis Date Noted  . Hyperlipidemia 03/14/2017  . Preoperative clearance 03/14/2017  . Malnutrition of moderate degree 06/13/2016  . Ileus following gastrointestinal surgery 06/12/2016  . GI bleed 06/03/2016  . Syncope   . Diverticulosis of colon with hemorrhage   . Hematochezia   . Lower gastrointestinal bleeding   . BRBPR (bright red blood per rectum) 05/27/2016  . Acute blood loss anemia 05/27/2016  . Hypotension due to blood  loss 05/27/2016  . AKI (acute kidney injury) ([place]) 05/27/2016  . Hyperkalemia 05/27/2016  . Adult onset hypothyroidism 12/20/2013  . Type II diabetes mellitus, uncontrolled ([place]) 12/20/2013  . Hypogonadism in male 12/02/2013  . Type 2 diabetes mellitus ([place]) 05/09/2011  . Hypertension 05/09/2011  . GERD (gastroesophageal reflux disease) 05/09/2011  . Hypogonadism male 05/09/2011   Past Medical History:  Diagnosis Date  . Barrett's esophagus   . Diabetes mellitus without complication ([place])   . Diverticulosis   . Duodenitis   . GERD (gastroesophageal reflux disease)   . Hiatal hernia    Past Surgical History:  Procedure Laterality Date  . IR GENERIC HISTORICAL  05/28/2016   IR ANGIOGRAM SELECTIVE EACH ADDITIONAL VESSEL 05/28/2016 Aletta Edouard, MD [facility]INTER[facility]  . IR GENERIC HISTORICAL  05/28/2016   IR ANGIOGRAM VISCERAL SELECTIVE 05/28/2016 Aletta Edouard, MD [facility]  . IR GENERIC HISTORICAL  05/28/2016   IR US GUIDE VASC ACCESS RIGHT 05/28/2016 Aletta Edouard, MD [facility]  . IR GENERIC HISTORICAL  05/28/2016   IR ANGIOGRAM VISCERAL SELECTIVE 05/28/2016 Arne Cleveland, MD [facility]  . IR GENERIC HISTORICAL  05/28/2016   IR ANGIOGRAM SELECTIVE EACH ADDITIONAL VESSEL 05/28/2016 Arne Cleveland, MD [facility]  . IR GENERIC HISTORICAL  05/28/2016   IR EMBO ART  VEN HEMORR LYMPH EXTRAV  INC GUIDE ROADMAPPING 05/28/2016 Arne Cleveland, MD [facility]  .  IR GENERIC HISTORICAL  05/28/2016   IR US GUIDE VASC ACCESS RIGHT 05/28/2016 Arne Cleveland, MD [facility]  . PARTIAL COLECTOMY N/A 06/06/2016   Procedure: OPEN ASCENDING COLECTOMY;  Surgeon: Stark Klein, MD;  Location: [facility] ORS;  Service: General;  Laterality: N/A;  . VASECTOMY     No Known Allergies Prior to Admission medications   Medication Sig Start Date End Date Taking? Authorizing Provider  ACCU-CHEK SMARTVIEW test strip USE TO TEST TWICE DAILY 04/07/17   Elayne Snare, MD  amLODipine (NORVASC) 5 MG tablet TAKE 1  TABLET(5 MG) BY MOUTH DAILY 03/24/17   [name], MD  aspirin 81 MG tablet Take 81 mg by mouth daily.    [provider]  B-D UF III MINI PEN NEEDLES 31G X 5 MM MISC USE ONE PER DAY WITH VICTOZA 02/12/17   Elayne Snare, MD  Blood Glucose Monitoring Suppl (ACCU-CHEK NANO SMARTVIEW) W/DEVICE KIT  12/02/13   [provider]  esomeprazole (NEXIUM) 40 MG capsule TAKE ONE CAPSULE BY MOUTH DAILY AT NOON 02/07/17   [name], MD  ferrous sulfate 325 (65 FE) MG tablet Take 1 tablet (325 mg total) by mouth 3 (three) times daily with meals. 05/31/16   Doreatha Lew, MD  FLUAD 0.5 ML SUSY  11/30/15   [provider]  glimepiride (AMARYL) 2 MG tablet TAKE 2 TABLETS(4 MG) BY MOUTH DAILY BEFORE DINNER 01/07/17   Elayne Snare, MD  levothyroxine (SYNTHROID, LEVOTHROID) 112 MCG tablet  12/09/16   [provider]  levothyroxine (SYNTHROID, LEVOTHROID) 112 MCG tablet TAKE 1 TABLET(112 MCG) BY MOUTH DAILY BEFORE BREAKFAST 02/26/17   Elayne Snare, MD  levothyroxine (SYNTHROID, LEVOTHROID) 125 MCG tablet Take 1 tablet (125 mcg total) by mouth daily. 10/18/16   Philemon Kingdom, MD  metFORMIN (GLUCOPHAGE-XR) 750 MG 24 hr tablet TAKE 2 TABLETS(1500 MG) BY MOUTH DAILY WITH BREAKFAST 03/26/17   Elayne Snare, MD  Multiple Vitamins-Minerals (MULTIVITAMIN ADULT) TABS Take by mouth daily.    [provider]  Omega-3 Fatty Acids (FISH OIL) 1200 MG CAPS Take by mouth daily.    [provider]  simvastatin (ZOCOR) 40 MG tablet Take 0.5 tablets (20 mg total) by mouth daily at 6 PM. 04/03/17   [name], MD  tamsulosin (FLOMAX) 0.4 MG CAPS capsule TAKE ONE CAPSULE BY MOUTH DAILY 04/02/17   [name], MD  TOUJEO SOLOSTAR 300 UNIT/ML SOPN INJECT 12 UNITS INTO THE SKIN DAILY 11/09/16   Elayne Snare, MD  VICTOZA 18 MG/3ML SOPN ADMINISTER 1.2 MG UNDER THE SKIN DAILY 01/07/17   Elayne Snare, MD   Social History   Socioeconomic History  . Marital status: Married      Spouse name: Not on file  . Number of children: Not on file  . Years of education: Not on file  . Highest education level: Not on file  Social Needs  . Financial resource strain: Not on file  . Food insecurity - worry: Not on file  . Food insecurity - inability: Not on file  . Transportation needs - medical: Not on file  . Transportation needs - non-medical: Not on file  Occupational History  . Not on file  Tobacco Use  . Smoking status: Never Smoker  . Smokeless tobacco: Never Used  Substance and Sexual Activity  . Alcohol use: No    Alcohol/week: 0.0 oz  . Drug use: No  . Sexual activity: Not on file  Other Topics Concern  . Not on  file  Social History Narrative  . Not on file   Review of Systems  Musculoskeletal: Negative for myalgias.      Objective:   Physical Exam  Constitutional: He is oriented to person, place, and time. He appears well-developed and well-nourished. No distress.  HENT:  Head: Normocephalic and atraumatic.  Eyes: Conjunctivae and EOM are normal.  Neck: Neck supple. No tracheal deviation present.  Cardiovascular: Normal rate and regular rhythm.  Pulmonary/Chest: Effort normal and breath sounds normal. No respiratory distress.  Musculoskeletal: Normal range of motion.  Neurological: He is alert and oriented to person, place, and time.  Skin: Skin is warm and dry.  Psychiatric: He has a normal mood and affect. His behavior is normal.  Nursing note and vitals reviewed.  Vitals:   04/14/17 1036  BP: (!) 150/72  Pulse: 71  Resp: 18  Temp: 98.4 F (36.9 C)  TempSrc: Oral  SpO2: 96%  Weight: 183 lb (83 kg)  Height: 5' 5" (1.651 m)      Assessment & Plan:   Zachary Cook is a 84 y.o. male Hyperlipidemia, unspecified hyperlipidemia type - Plan: Lipid panel, Comprehensive metabolic panel, atorvastatin (LIPITOR) 10 MG tablet, Hepatic Function Panel, Lipid panel Previous concern of amlodipine and simvastatin combination. With history of  diabetes would still want to be on statin.   - Change simvastatin to Lipitor 10 mg daily to allow for dosing changes if needed. Check CMP, lipid panel. Lipids may be slightly elevated as off medication for past week or 2.  - Lab only visit in 6 weeks. RTC precautions if new side effects/myalgias.  Meds ordered this encounter  Medications  . atorvastatin (LIPITOR) 10 MG tablet    Sig: Take 1 tablet (10 mg total) by mouth daily at 6 PM.    Dispense:  90 tablet    Refill:  0   Patient Instructions   I will check your cholesterol levels today. Return in 6 weeks just for blood work for cholesterol and liver testing.   If any side effects of new medication, or questions - please let me know.   IF you received an x-ray today, you will receive an invoice from [facility] Radiology. Please contact [facility] Radiology at [phone] with questions or concerns regarding your invoice.   IF you received labwork today, you will receive an invoice from [place]. Please contact [facility] at [phone] with questions or concerns regarding your invoice.   Our billing staff will not be able to assist you with questions regarding bills from these companies.  You will be contacted with the lab results as soon as they are available. The fastest way to get your results is to activate your My Chart account. Instructions are located on the last page of this paperwork. If you have not heard from [place] regarding the results in 2 weeks, please contact this office.      I personally performed the services described in this documentation, which was scribed in my presence. The recorded information has been reviewed and considered for accuracy and completeness, addended by me as needed, and agree with information above.  Signed,   Merri Ray, MD Primary [facility] at [facility].  04/14/17 11:00 AM

## 2017-05-02 ENCOUNTER — Other Ambulatory Visit: Payer: Self-pay | Admitting: Family Medicine

## 2017-05-05 ENCOUNTER — Other Ambulatory Visit (INDEPENDENT_AMBULATORY_CARE_PROVIDER_SITE_OTHER): Payer: Medicare Other

## 2017-05-05 ENCOUNTER — Other Ambulatory Visit: Payer: Self-pay | Admitting: Family Medicine

## 2017-05-05 DIAGNOSIS — E063 Autoimmune thyroiditis: Secondary | ICD-10-CM

## 2017-05-05 DIAGNOSIS — E1165 Type 2 diabetes mellitus with hyperglycemia: Secondary | ICD-10-CM | POA: Diagnosis not present

## 2017-05-05 DIAGNOSIS — Z794 Long term (current) use of insulin: Secondary | ICD-10-CM | POA: Diagnosis not present

## 2017-05-05 DIAGNOSIS — K2270 Barrett's esophagus without dysplasia: Secondary | ICD-10-CM

## 2017-05-05 LAB — BASIC METABOLIC PANEL
BUN: 28 mg/dL — ABNORMAL HIGH (ref 6–23)
CALCIUM: 10.1 mg/dL (ref 8.4–10.5)
CO2: 28 mEq/L (ref 19–32)
Chloride: 104 mEq/L (ref 96–112)
Creatinine, Ser: 1.52 mg/dL — ABNORMAL HIGH (ref 0.40–1.50)
GFR: 46.81 mL/min — AB (ref >60.00)
GLUCOSE: 169 mg/dL — AB (ref 70–99)
POTASSIUM: 4.8 meq/L (ref 3.5–5.1)
SODIUM: 140 meq/L (ref 135–145)

## 2017-05-05 LAB — TSH: TSH: 20.13 u[IU]/mL — AB (ref 0.35–4.50)

## 2017-05-05 LAB — T4, FREE: FREE T4: 0.75 ng/dL (ref 0.60–1.60)

## 2017-05-07 LAB — FRUCTOSAMINE: FRUCTOSAMINE: 322 umol/L — AB (ref 0–285)

## 2017-05-09 ENCOUNTER — Ambulatory Visit: Payer: Medicare Other | Admitting: Endocrinology

## 2017-05-09 ENCOUNTER — Encounter: Payer: Self-pay | Admitting: Endocrinology

## 2017-05-09 VITALS — BP 148/100 | HR 62 | Ht 65.0 in | Wt 185.6 lb

## 2017-05-09 DIAGNOSIS — N183 Chronic kidney disease, stage 3 unspecified: Secondary | ICD-10-CM

## 2017-05-09 DIAGNOSIS — E1165 Type 2 diabetes mellitus with hyperglycemia: Secondary | ICD-10-CM | POA: Diagnosis not present

## 2017-05-09 DIAGNOSIS — E063 Autoimmune thyroiditis: Secondary | ICD-10-CM | POA: Diagnosis not present

## 2017-05-09 DIAGNOSIS — Z794 Long term (current) use of insulin: Secondary | ICD-10-CM

## 2017-05-09 MED ORDER — METFORMIN HCL ER 500 MG PO TB24: 1000.0000 mg | ORAL_TABLET | Freq: Every day | ORAL | 3 refills | Status: DC

## 2017-05-09 MED ORDER — LEVOTHYROXINE SODIUM 150 MCG PO TABS: 150.0000 ug | ORAL_TABLET | Freq: Every day | ORAL | 2 refills | Status: DC

## 2017-05-09 NOTE — Progress Notes (Signed)
Patient ID: Zachary Cook, male   DOB: 1933/01/23, 84 y.o.   MRN: [id number]           Reason for Appointment: Follow-Up    History of Present Illness:          Diagnosis: Type 2 diabetes mellitus, date of diagnosis: 2005       Past history: He was started on metformin when he was found to have diabetes on routine lab work. Details of this are not available He thinks his blood sugars had been fairly well controlled for several years with metformin alone but no records are available He has had a couple of different physicians and has not always been followed consistently for his diabetes  Blood sugars had been significantly higher in the 4 months prior to his consultation in 10/15 and his A1c had gone up to 12% with his regimen of glipizide and metformin  He was given a trial of Invokana but this did not improve his blood sugars He has been on Victoza since 10/15 and this was restarted in 1/16, initially had difficulty with nausea  Recent history:    INSULIN regimen: Toujeo 16 units daily in am  Non-insulin hypoglycemic drugs : Metformin ER 750 mg, 2 tablets daily, Amaryl 2 mg at supper, Victoza 1.2 mg daily  Since his last visit in 9/18 he has had  intercurrent medical problems and did not follow-up as directed  His A1c is usually lower than expected at 6.5 previously but his FRUCTOSAMINE is still high at 322  Current blood sugar patterns and problems identified:  He did go up 2 units on his detail insulin because of relatively higher fasting readings  Despite repeated reminders he does not check his blood sugars after meals  He is usually not eating breakfast and mostly one meal around 2 PM  He thinks that he has had some difficulty with his appetite  and waiting for abdominal hernia surgery  Overall his pre-meal blood sugars are averaging about 160 although fluctuating between 98 up to 221  He has continued his other medications unchanged  Currently asking about  difficulty swallowing his large Metformin tablets  His weight is only slightly different than before        Glucose monitoring:  done <1 times a day         Glucometer: Accu-Chek       Blood Glucose readings  Recently readings from download   AVERAGE of 162 with a range of 98-221, all readings before first meal at 2 PM  Self-care: The diet that the patient has been following is: None, usually low fat      Meals:  Mostly 1 at 2 pm           Exercise: Not walking   Dietician visit, most recent: None.               Weight history: Previous range  175-205  Wt Readings from Last 3 Encounters:  05/09/17 185 lb 9.6 oz (84.2 kg)  04/14/17 183 lb (83 kg)  03/14/17 183 lb 6.4 oz (83.2 kg)    Glycemic control:   Lab Results  Component Value Date   HGBA1C 6.5 10/14/2016   HGBA1C 6.3 07/12/2016   HGBA1C 8.4 (H) 03/08/2016   Lab Results  Component Value Date   MICROALBUR 14.0 (H) 07/12/2016   LDLCALC 117 (H) 04/14/2017   CREATININE 1.52 (H) 05/05/2017    Lab Results  Component Value Date   FRUCTOSAMINE  322 (H) 05/05/2017   FRUCTOSAMINE 323 (H) 11/28/2016   FRUCTOSAMINE 310 (H) 05/03/2016    OTHER active problems: See review of systems   Lab on 05/05/2017  Component Date Value Ref Range Status  . Free T4 05/05/2017 0.75  0.60 - 1.60 ng/dL Final   Comment: Specimens from patients who are undergoing biotin therapy and /or ingesting biotin supplements may contain high levels of biotin.  The higher biotin concentration in these specimens interferes with this Free T4 assay.  Specimens that contain high levels  of biotin may cause false high results for this Free T4 assay.  Please interpret results in light of the total clinical presentation of the patient.    Marland Kitchen TSH 05/05/2017 20.13* 0.35 - 4.50 uIU/mL Final  . Fructosamine 05/05/2017 322* 0 - 285 umol/L Final   Comment: Published reference interval for apparently healthy subjects between age 61 and 14 is 61 - 285 umol/L and  in a poorly controlled diabetic population is 228 - 563 umol/L with a mean of 396 umol/L.   Marland Kitchen Sodium 05/05/2017 140  135 - 145 mEq/L Final  . Potassium 05/05/2017 4.8  3.5 - 5.1 mEq/L Final  . Chloride 05/05/2017 104  96 - 112 mEq/L Final  . CO2 05/05/2017 28  19 - 32 mEq/L Final  . Glucose, Bld 05/05/2017 169* 70 - 99 mg/dL Final  . BUN 05/05/2017 28* 6 - 23 mg/dL Final  . Creatinine, Ser 05/05/2017 1.52* 0.40 - 1.50 mg/dL Final  . Calcium 05/05/2017 10.1  8.4 - 10.5 mg/dL Final  . GFR 05/05/2017 46.81* >60.00 mL/min Final      Allergies as of 05/09/2017   No Known Allergies     Medication List        Accurate as of 05/09/17 11:59 PM. Always use your most recent med list.          ACCU-CHEK NANO SMARTVIEW w/Device Kit   ACCU-CHEK SMARTVIEW test strip Generic drug:  glucose blood USE TO TEST TWICE DAILY   amLODipine 5 MG tablet Commonly known as:  NORVASC TAKE 1 TABLET(5 MG) BY MOUTH DAILY   aspirin EC 81 MG tablet Take 81 mg by mouth daily.   atorvastatin 10 MG tablet Commonly known as:  LIPITOR Take 1 tablet (10 mg total) by mouth daily at 6 PM.   B-D UF III MINI PEN NEEDLES 31G X 5 MM Misc Generic drug:  Insulin Pen Needle USE ONE PER DAY WITH VICTOZA   esomeprazole 40 MG capsule Commonly known as:  NEXIUM TAKE ONE CAPSULE BY MOUTH DAILY AT NOON   Fish Oil 1200 MG Caps Take 1,200 mg by mouth every evening.   glimepiride 2 MG tablet Commonly known as:  AMARYL TAKE 2 TABLETS(4 MG) BY MOUTH DAILY BEFORE DINNER   levothyroxine 150 MCG tablet Commonly known as:  SYNTHROID, LEVOTHROID Take 1 tablet (150 mcg total) by mouth daily.   metFORMIN 500 MG 24 hr tablet Commonly known as:  GLUCOPHAGE-XR Take 2 tablets (1,000 mg total) by mouth daily with supper.   MULTIVITAMIN ADULT Tabs Take 1 tablet by mouth every evening.   tamsulosin 0.4 MG Caps capsule Commonly known as:  FLOMAX TAKE ONE CAPSULE BY MOUTH DAILY   TOUJEO SOLOSTAR 300 UNIT/ML  Sopn Generic drug:  Insulin Glargine INJECT 12 UNITS INTO THE SKIN DAILY   VICTOZA 18 MG/3ML Sopn Generic drug:  liraglutide ADMINISTER 1.2 MG UNDER THE SKIN DAILY       Allergies: No Known Allergies  Past Medical History:  Diagnosis Date  . Barrett's esophagus   . Diabetes mellitus without complication ([place])   . Diverticulosis   . Duodenitis   . GERD (gastroesophageal reflux disease)   . Hiatal hernia     Past Surgical History:  Procedure Laterality Date  . IR GENERIC HISTORICAL  05/28/2016   IR ANGIOGRAM SELECTIVE EACH ADDITIONAL VESSEL 05/28/2016 Aletta Edouard, MD [facility]  . IR GENERIC HISTORICAL  05/28/2016   IR ANGIOGRAM VISCERAL SELECTIVE 05/28/2016 Aletta Edouard, MD [facility]  . IR GENERIC HISTORICAL  05/28/2016   IR US GUIDE VASC ACCESS RIGHT 05/28/2016 Aletta Edouard, MD [facility]  . IR GENERIC HISTORICAL  05/28/2016   IR ANGIOGRAM VISCERAL SELECTIVE 05/28/2016 Arne Cleveland, MD [facility]  . IR GENERIC HISTORICAL  05/28/2016   IR ANGIOGRAM SELECTIVE EACH ADDITIONAL VESSEL 05/28/2016 Arne Cleveland, MD [facility]  . IR GENERIC HISTORICAL  05/28/2016   IR EMBO ART  VEN HEMORR LYMPH EXTRAV  INC GUIDE ROADMAPPING 05/28/2016 Arne Cleveland, MD [facility]  . IR GENERIC HISTORICAL  05/28/2016   IR US GUIDE VASC ACCESS RIGHT 05/28/2016 Arne Cleveland, MD [facility]  . PARTIAL COLECTOMY N/A 06/06/2016   Procedure: OPEN ASCENDING COLECTOMY;  Surgeon: Stark Klein, MD;  Location: [facility];  Service: General;  Laterality: N/A;  . VASECTOMY      Family History  Problem Relation Age of Onset  . Heart disease Mother   . Diabetes Mother   . Cancer Father   . Heart attack Son     Social History:  reports that  has never smoked. he has never used smokeless tobacco. He reports that he does not drink alcohol or use drugs.    Review of Systems     HYPERTENSION: Followed by PCP  Blood pressure recently higher   BP Readings from Last 3 Encounters:   05/09/17 (!) 148/100  04/14/17 (!) 150/72  03/14/17 (!) 158/84   RENAL dysfunction and hyperkalemia: Potassium is normal but creatinine is persistently higher  Lab Results  Component Value Date   CREATININE 1.52 (H) 05/05/2017   CREATININE 1.58 (H) 04/14/2017   CREATININE 1.56 (H) 02/21/2017     Lab Results  Component Value Date   CREATININE 1.52 (H) 05/05/2017   BUN 28 (H) 05/05/2017   NA 140 05/05/2017   K 4.8 05/05/2017   CL 104 05/05/2017   CO2 28 05/05/2017          Lipids: Most recent labs as below, now taking Lipitor instead of Zocor that had been prescribed before by his PCP Recent labs:       Lab Results  Component Value Date   CHOL 190 04/14/2017   HDL 43 04/14/2017   LDLCALC 117 (H) 04/14/2017   LDLDIRECT 125.0 12/14/2014   TRIG 150 (H) 04/14/2017   CHOLHDL 4.4 04/14/2017                  Thyroid:  He   was found to have hypothyroidism about the year 2000 on routine labs without symptoms. Apparently he did not  feel any different with taking the thyroid supplement.   His TSH is persistently high, on his last visit he was appearing not to be regular with his medication However now even with his trying to be more regular with levothyroxine he is having a high TSH He does feel somewhat tired and more cold lately although has not discussed this with his PCP   Currently taking 125 mcg  in the mornings  Labs as follows:  Lab Results  Component Value Date   TSH 20.13 (H) 05/05/2017   TSH 20.98 (H) 11/28/2016   TSH 27.69 (H) 10/14/2016   FREET4 0.75 05/05/2017   FREET4 0.65 11/28/2016   FREET4 0.58 (L) 10/14/2016       He has been told to have hypogonadism of unclear etiology for the last few years.  He has been  reluctant to go back on AndroGel   Lab Results  Component Value Date   TESTOSTERONE 174.72 (L) 12/14/2014     LABS:  Lab on 05/05/2017  Component Date Value Ref Range Status  . Free T4 05/05/2017 0.75  0.60 - 1.60 ng/dL Final    Comment: Specimens from patients who are undergoing biotin therapy and /or ingesting biotin supplements may contain high levels of biotin.  The higher biotin concentration in these specimens interferes with this Free T4 assay.  Specimens that contain high levels  of biotin may cause false high results for this Free T4 assay.  Please interpret results in light of the total clinical presentation of the patient.    Marland Kitchen TSH 05/05/2017 20.13* 0.35 - 4.50 uIU/mL Final  . Fructosamine 05/05/2017 322* 0 - 285 umol/L Final   Comment: Published reference interval for apparently healthy subjects between age 31 and 45 is 20 - 285 umol/L and in a poorly controlled diabetic population is 228 - 563 umol/L with a mean of 396 umol/L.   Marland Kitchen Sodium 05/05/2017 140  135 - 145 mEq/L Final  . Potassium 05/05/2017 4.8  3.5 - 5.1 mEq/L Final  . Chloride 05/05/2017 104  96 - 112 mEq/L Final  . CO2 05/05/2017 28  19 - 32 mEq/L Final  . Glucose, Bld 05/05/2017 169* 70 - 99 mg/dL Final  . BUN 05/05/2017 28* 6 - 23 mg/dL Final  . Creatinine, Ser 05/05/2017 1.52* 0.40 - 1.50 mg/dL Final  . Calcium 05/05/2017 10.1  8.4 - 10.5 mg/dL Final  . GFR 05/05/2017 46.81* >60.00 mL/min Final    Physical Examination:  BP (!) 148/100   Pulse 62   Ht '5\' 5"'$  (1.651 m)   Wt 185 lb 9.6 oz (84.2 kg)   SpO2 96%   BMI 30.89 kg/m    ASSESSMENT:  Diabetes type 2, BMI 30 See history of present illness for detailed discussion of current diabetes management, blood sugar patterns and problems identified  He is taking basal insulin along with metformin, Amaryl and Victoza  He has come back after several months and his control does not appear to be any better His fasting readings are about 160 with current basal insulin However he may have high readings after his meals which he does not check His appetite has been variable and with only one meal a day he may still be having high postprandial readings as judged by his  fructosamine  Discussed adequate management of the diabetes includes control of postprandial readings and discuss these targets  HYPOTHYROIDISM: TSH is continuing to be about 20 Although he is now relatively more compliant with his levothyroxine does appear to have some symptomatic hypothyroidism   HYPERTENSION: Followed by PCP, he will discuss with modification of his treatment at his follow-up visit, blood pressure appears to be consistently high  Lipids: LDL is still high even with taking Lipitor 10 mg, again to follow-up with PCP  PLAN:   Discussed adequate management of his diabetes which appears to be somewhat progressive  He may need mealtime insulin  to control his postprandial hyperglycemia  However discussed that he can alternate fasting and postprandial readings after his main meal to help [place] adjust his medication  Also not clear if he can continue to take 1500 mg of metformin with his continued renal dysfunction  Since he is having difficulty swallowing 750 mg tablets he will try 2 tablets of the 500 mg metformin ER  Continue Amaryl for now  Tolerate would be also he will start trying to check his readings after lunch or dinner and not just in the morning before his first meal  He may well need mealtime insulin also and discussed that this would also help his overnight readings  Increased Toujeo by 2 units for now and take 18 units for now  Will need to follow-up in about 6 weeks again to reassess especially if his appetite and meals change after his upcoming surgery  He will call if his blood sugars are over 200 after lunch consistently  For his hypothyroidism we will increase his dose of 125 mcg up to 150   Patient Instructions  Toujeo 18 units   CALL IF SUGARS .200 AFTER LUNCH at about 4 pm   Counseling time on subjects discussed in assessment and plan sections is over 50% of today's 25 minute visit    Elayne Snare 05/11/2017, 9:12 PM   Note: This office  note was prepared with Dragon voice recognition system technology. Any transcriptional errors that result from this process are unintentional.

## 2017-05-09 NOTE — Patient Instructions (Signed)
Toujeo 18 units   CALL IF SUGARS .200 AFTER LUNCH at about 4 pm

## 2017-05-12 ENCOUNTER — Ambulatory Visit: Payer: Medicare Other

## 2017-05-12 ENCOUNTER — Other Ambulatory Visit: Payer: Self-pay | Admitting: Endocrinology

## 2017-05-12 DIAGNOSIS — E785 Hyperlipidemia, unspecified: Secondary | ICD-10-CM

## 2017-05-13 LAB — HEPATIC FUNCTION PANEL
ALT: 11 IU/L (ref 0–44)
AST: 17 IU/L (ref 0–40)
Albumin: 4.3 g/dL (ref 3.5–4.7)
Alkaline Phosphatase: 61 IU/L (ref 39–117)
Bilirubin Total: 0.5 mg/dL (ref 0.0–1.2)
Bilirubin, Direct: 0.17 mg/dL (ref 0.00–0.40)
Total Protein: 7.5 g/dL (ref 6.0–8.5)

## 2017-05-13 LAB — LIPID PANEL
CHOLESTEROL TOTAL: 154 mg/dL (ref 100–199)
Chol/HDL Ratio: 3.9 ratio (ref 0.0–5.0)
HDL: 40 mg/dL (ref >39)
LDL Calculated: 89 mg/dL (ref 0–99)
TRIGLYCERIDES: 127 mg/dL (ref 0–149)
VLDL Cholesterol Cal: 25 mg/dL (ref 5–40)

## 2017-05-19 ENCOUNTER — Encounter (HOSPITAL_COMMUNITY): Payer: Self-pay

## 2017-05-19 NOTE — Pre-Procedure Instructions (Signed)
The following are in epic:  Cardiac clearance note Dr. Gwenlyn Found 03/24/2017 Last office visit note Dr. Dwyane Dee 05/09/2017 Last office visit note Dr. Carlota Raspberry 04/14/2017 EKG 02/21/2017 CXR 02/21/2017

## 2017-05-19 NOTE — Patient Instructions (Signed)
Your procedure is scheduled on: Wednesday, May 28, 2017   Surgery Time:  8:30AM-11:30AM   Report to [facility] Main  Entrance    Report to admitting at 6:30  AM   Call this number if you have problems the morning of surgery [phone]   Do not eat food or drink liquids :After Midnight.   Do NOT smoke after Midnight   Complete one Ensure drink the morning of surgery 3 hours just prior to leaving home for surgery.   Take these medicines the morning of surgery with A SIP OF WATER: Amlodipine, Nexium, Levothyroxine, Tamsulosin   DO NOT TAKE ANY DIABETIC MEDICATIONS DAY OF YOUR SURGERY                               You may not have any metal on your body including jewelry, and body piercings             Do not wear lotions, powders, perfumes/cologne, or deodorant                          Men may shave face and neck.   Do not bring valuables to the hospital. [place].   Contacts, dentures or bridgework may not be worn into surgery.   Leave suitcase in the car. After surgery it may be brought to your room.   Special Instructions: Bring a copy of your healthcare power of attorney and living will documents         the day of surgery if you haven't scanned them in before.              Please read over the following fact sheets you were given:  [facility] - Preparing for Surgery Before surgery, you can play an important role.  Because skin is not sterile, your skin needs to be as free of germs as possible.  You can reduce the number of germs on your skin by washing with CHG (chlorahexidine gluconate) soap before surgery.  CHG is an antiseptic cleaner which kills germs and bonds with the skin to continue killing germs even after washing. Please DO NOT use if you have an allergy to CHG or antibacterial soaps.  If your skin becomes reddened/irritated stop using the CHG and inform your nurse when you arrive at Short  Stay. Do not shave (including legs and underarms) for at least 48 hours prior to the first CHG shower.  You may shave your face/neck.  Please follow these instructions carefully:  1.  Shower with CHG Soap the night before surgery and the  morning of surgery.  2.  If you choose to wash your hair, wash your hair first as usual with your normal  shampoo.  3.  After you shampoo, rinse your hair and body thoroughly to remove the shampoo.                             4.  Use CHG as you would any other liquid soap.  You can apply chg directly to the skin and wash.  Gently with a scrungie or clean washcloth.  5.  Apply the CHG Soap to your body ONLY FROM THE NECK DOWN.   Do  not use on face/ open                           Wound or open sores. Avoid contact with eyes, ears mouth and   genitals (private parts).                       Wash face,  Genitals (private parts) with your normal soap.             6.  Wash thoroughly, paying special attention to the area where your    surgery  will be performed.  7.  Thoroughly rinse your body with warm water from the neck down.  8.  DO NOT shower/wash with your normal soap after using and rinsing off the CHG Soap.                9.  Pat yourself dry with a clean towel.            10.  Wear clean pajamas.            11.  Place clean sheets on your bed the night of your first shower and do not  sleep with pets. Day of Surgery : Do not apply any lotions/deodorants the morning of surgery.  Please wear clean clothes to the hospital/surgery center.  FAILURE TO FOLLOW THESE INSTRUCTIONS MAY RESULT IN THE CANCELLATION OF YOUR SURGERY  PATIENT SIGNATURE_________________________________  NURSE SIGNATURE__________________________________  ________________________________________________________________________

## 2017-05-20 ENCOUNTER — Other Ambulatory Visit: Payer: Self-pay

## 2017-05-20 ENCOUNTER — Telehealth: Payer: Self-pay | Admitting: Endocrinology

## 2017-05-20 ENCOUNTER — Encounter (HOSPITAL_COMMUNITY)
Admission: RE | Admit: 2017-05-20 | Discharge: 2017-05-20 | Disposition: A | Discharge status: Home / Self Care | Payer: Medicare Other | Source: Ambulatory Visit | Attending: General Surgery | Admitting: General Surgery

## 2017-05-20 ENCOUNTER — Encounter (HOSPITAL_COMMUNITY): Payer: Self-pay

## 2017-05-20 DIAGNOSIS — K432 Incisional hernia without obstruction or gangrene: Secondary | ICD-10-CM | POA: Insufficient documentation

## 2017-05-20 DIAGNOSIS — Z01812 Encounter for preprocedural laboratory examination: Secondary | ICD-10-CM | POA: Diagnosis present

## 2017-05-20 HISTORY — DX: Cardiomegaly: I51.7

## 2017-05-20 HISTORY — DX: Personal history of other diseases of the digestive system: Z87.19

## 2017-05-20 HISTORY — DX: Umbilical hernia without obstruction or gangrene: K42.9

## 2017-05-20 HISTORY — DX: Bilateral inguinal hernia, without obstruction or gangrene, not specified as recurrent: K40.20

## 2017-05-20 HISTORY — DX: Testicular hypofunction: E29.1

## 2017-05-20 HISTORY — DX: Personal history of other endocrine, nutritional and metabolic disease: Z86.39

## 2017-05-20 HISTORY — DX: Ileus, unspecified: K56.7

## 2017-05-20 HISTORY — DX: Ventral hernia without obstruction or gangrene: K43.9

## 2017-05-20 HISTORY — DX: Hypothyroidism, unspecified: E03.9

## 2017-05-20 HISTORY — DX: Calculus of gallbladder without cholecystitis without obstruction: K80.20

## 2017-05-20 HISTORY — DX: Essential (primary) hypertension: I10

## 2017-05-20 HISTORY — DX: Personal history of other medical treatment: Z92.89

## 2017-05-20 HISTORY — DX: Other postprocedural complications and disorders of digestive system: K91.89

## 2017-05-20 HISTORY — DX: Hyperlipidemia, unspecified: E78.5

## 2017-05-20 HISTORY — DX: Syncope and collapse: R55

## 2017-05-20 HISTORY — DX: Unspecified malignant neoplasm of skin, unspecified: C44.90

## 2017-05-20 HISTORY — DX: Chronic kidney disease, unspecified: N18.9

## 2017-05-20 LAB — COMPREHENSIVE METABOLIC PANEL
ALBUMIN: 4 g/dL (ref 3.5–5.0)
ALT: 12 U/L — ABNORMAL LOW (ref 17–63)
AST: 16 U/L (ref 15–41)
Alkaline Phosphatase: 59 U/L (ref 38–126)
Anion gap: 5 (ref 5–15)
BUN: 34 mg/dL — AB (ref 6–20)
CHLORIDE: 112 mmol/L — AB (ref 101–111)
CO2: 24 mmol/L (ref 22–32)
Calcium: 9.6 mg/dL (ref 8.9–10.3)
Creatinine, Ser: 1.57 mg/dL — ABNORMAL HIGH (ref 0.61–1.24)
GFR calc Af Amer: 46 mL/min — ABNORMAL LOW (ref >60)
GFR, EST NON AFRICAN AMERICAN: 39 mL/min — AB (ref >60)
Glucose, Bld: 176 mg/dL — ABNORMAL HIGH (ref 65–99)
POTASSIUM: 4.5 mmol/L (ref 3.5–5.1)
Sodium: 141 mmol/L (ref 135–145)
Total Bilirubin: 0.5 mg/dL (ref 0.3–1.2)
Total Protein: 7.7 g/dL (ref 6.5–8.1)

## 2017-05-20 LAB — CBC WITH DIFFERENTIAL/PLATELET
BASOS ABS: 0.1 10*3/uL (ref 0.0–0.1)
BASOS PCT: 1 %
EOS ABS: 0.2 10*3/uL (ref 0.0–0.7)
EOS PCT: 3 %
HCT: 40.6 % (ref 39.0–52.0)
Hemoglobin: 13 g/dL (ref 13.0–17.0)
Lymphocytes Relative: 37 %
Lymphs Abs: 2.9 10*3/uL (ref 0.7–4.0)
MCH: 29.1 pg (ref 26.0–34.0)
MCHC: 32 g/dL (ref 30.0–36.0)
MCV: 91 fL (ref 78.0–100.0)
MONO ABS: 0.5 10*3/uL (ref 0.1–1.0)
Monocytes Relative: 7 %
Neutro Abs: 4.2 10*3/uL (ref 1.7–7.7)
Neutrophils Relative %: 52 %
PLATELETS: 200 10*3/uL (ref 150–400)
RBC: 4.46 MIL/uL (ref 4.22–5.81)
RDW: 14.7 % (ref 11.5–15.5)
WBC: 8 10*3/uL (ref 4.0–10.5)

## 2017-05-20 LAB — GLUCOSE, CAPILLARY: GLUCOSE-CAPILLARY: 157 mg/dL — AB (ref 65–99)

## 2017-05-20 NOTE — Pre-Procedure Instructions (Signed)
Reviewed Zachary Cook's CMP results 05/20/17 and medical history with Dr. Ambrose Pancoast. Dr. Ambrose Pancoast wants Zachary Cook to be seen by [facility] prior to his surgery 05/28/17 to get clearance for surgery. I left a message with Zachary Cook at Dr. Darrel Hoover office that he needs to be seen for his kidney's prior to surgery 05/28/17.  Zachary Cook had already spoke with someone at [facility] to schedule his appointment for after his surgery. I spoke with him on the phone and informed him that per anesthesia department they would like for him to be seen at [facility] prior to surgery.  Zachary Cook verbalized understanding and states he will call to see if they can see him before his surgery 05/28/17.

## 2017-05-20 NOTE — Telephone Encounter (Signed)
Tamika-RN presurgical testing at [facility] ph# [phone] called-she needs lab results from 05/11/17. Please fax to # [phone] asap.

## 2017-05-21 NOTE — Telephone Encounter (Signed)
Called and left a message for Tamika to call me back

## 2017-05-21 NOTE — Telephone Encounter (Signed)
Spoke to Portia and she wanted to know if the patient got his A1C done here but he has not- this has been resolved

## 2017-05-22 ENCOUNTER — Other Ambulatory Visit: Payer: Self-pay | Admitting: Endocrinology

## 2017-05-25 NOTE — H&P (Signed)
[place] Location: [facility] Patient #: [id number] DOB: 03-Nov-1932 Married / Language: English / Race: White Male       History of Present Illness  The patient is a 84 year old male who presents with an incisional hernia. This is a pleasant 84 year old gentleman who returns to discuss and plan repair of his abdominal incisional hernia. [facility]ary Cook at [facility] is his PCP. Ruffin Frederick is his endocrinologist. Quay Burow is his cardiologist. He was referred by Dr. Barry Dienes for an incisional hernia. In March 2018 he underwent open right colectomy for right-sided GI bleeding that failed embolization. He had a large cecal diverticulum. Following resection the bleeding stopped. He has noticed a bulge to the left and above his umbilicus since October. Dr. Barry Dienes asked if I would assume his care.  We did a CT scan which showed an incisional hernia in the midline at the umbilicus with nonobstructed small bowel. Small bilateral inguinal hernias containing fat only and gallstones. Because of his comorbidities I had him evaluated by Dr. Merri Cook and Dr. Quay Burow and both say he is acceptable risk for surgery. Comorbidities include insulin-dependent diabetes, BMI 29, laparotomy last year. Barrett's esophagus and hiatal hernia. Hypertension. Hyperlipidemia. Self as been stable. He does not have any coronary artery disease, pulmonary disease or renal disease that we know Social history reveals that he is married with 1 son is with him today. Retired Engineer, structural. Denies tobacco. Takes alcohol rarely. I explained to him that he would need to have an open repair, and described retrorectus and TAR--type procedure. We will set this up in the near future. He will be scheduled for open repair of his incisional hernia with mesh in the near future. I discussed the indications, details, techniques, and numerous risk of the surgery with him  and his son. They're aware the risk of bleeding, infection, recurrence of the hernia, injury to adjacent organs requiring reconstructive surgery and alteration of procedure technique, cardiac pulmonary and thromboembolic problems. He understands these issues well. All his questions were answered. He agrees with this plan.  Plan close glucose monitoring in the perioperative period    Allergies  No Known Drug Allergies  Allergies Reconciled   Medication History Accu-Chek SmartView (In Vitro) Active. AmLODIPine Besylate (5MG  Tablet, Oral) Active. BD Pen Needle Mini U/F (31G X 5 MM Misc,) Active. Esomeprazole Magnesium (40MG  Capsule DR, Oral) Active. Glimepiride (2MG  Tablet, Oral) Active. Levothyroxine Sodium (125MCG Tablet, Oral) Active. MetFORMIN HCl ER (750MG  Tablet ER 24HR, Oral) Active. Simvastatin (40MG  Tablet, Oral) Active. Tamsulosin HCl (0.4MG  Capsule, Oral) Active. Toujeo SoloStar (300UNIT/ML Soln Pen-inj, Subcutaneous) Active. Victoza (18MG /3ML Soln Pen-inj, Subcutaneous) Active. Aspirin (81MG  Tablet DR, Oral) Active. Medications Reconciled  Vitals  Weight: 185.8 lb Height: 64in Body Surface Area: 1.9 m Body Mass Index: 31.89 kg/m  Temp.: 97.3F  Pulse: 96 (Regular)  BP: 150/98 (Sitting, Left Arm, Standard)    Physical Exam  General Mental Status-Alert. General Appearance-Not in acute distress. Build & Nutrition-Well nourished. Posture-Normal posture. Gait-Normal. Note: Elderly but bright and alert. Good performance status. Good insight. Mental status normal   Head and Neck Head-normocephalic, atraumatic with no lesions or palpable masses. Trachea-midline. Thyroid Gland Characteristics - normal size and consistency and no palpable nodules.  Chest and Lung Exam Chest and lung exam reveals -on auscultation, normal breath sounds, no adventitious sounds and normal vocal resonance.  Cardiovascular Cardiovascular  examination reveals -normal heart sounds, regular rate and rhythm with no murmurs and femoral  artery auscultation bilaterally reveals normal pulses, no bruits, no thrills.  Abdomen Note: Soft. Liver and spleen not enlarged. There is an upper midline incision from just below the xiphoid down to the umbilicus. There is a hernia weakness at the base of the umbilicus and a baseball-sized hernia protruding to the left of the lower end of the incision. I cannot detect an inguinal hernia. No mass   Neurologic Neurologic evaluation reveals -alert and oriented x 3 with no impairment of recent or remote memory, normal attention span and ability to concentrate, normal sensation and normal coordination.  Musculoskeletal Normal Exam - Bilateral-Upper Extremity Strength Normal and Lower Extremity Strength Normal.    Assessment & Plan  VENTRAL INCISIONAL HERNIA (K43.2) .   Your CT scan shows the incisional hernia at and slightly below the umbilicus. Some loops of small bowel move in and out of this hernia but there is no obstruction. you also have bilateral inguinal hernias which are tiny and you also have gallstones. These problems do not need to be addressed at this time. Dr. Merri Cook and Dr. Donnella Bi have stated that you are low risk from a cardiac standpoint and have given the go ahead for surgery  We have discussed nonoperative management and discussed the techniques of surgical repair in detail The state that it is bothering you and often you would like to go ahead and have this repaired  He will be scheduled for open repair of your incisional hernia with mesh You have requested [place] long which is very good I discussed the indications, techniques, and risks of this surgery in detail with you and your son  TYPE 2 DIABETES MELLITUS WITH INSULIN THERAPY (E11.9) HISTORY OF PARTIAL COLECTOMY (Z90.49) HYPERTENSION, ESSENTIAL (I10) BARRETT'S ESOPHAGUS  (K22.70) DIVERTICULITIS OF COLON WITH BLEEDING (K57.33)    Lachandra Dettmann M. Dalbert Batman, M.D., [facility] General and Minimally invasive Surgery Breast and Colorectal Surgery Office:   [phone] Pager:   [phone]

## 2017-05-26 ENCOUNTER — Encounter (HOSPITAL_COMMUNITY): Payer: Self-pay

## 2017-05-26 NOTE — Pre-Procedure Instructions (Signed)
Surgical Clearance from Dr. Pamala Duffel Kidney 05/21/2017 in chart.

## 2017-05-27 ENCOUNTER — Inpatient Hospital Stay (HOSPITAL_COMMUNITY): Payer: Medicare Other | Admitting: Anesthesiology

## 2017-05-27 ENCOUNTER — Encounter: Payer: Self-pay | Admitting: *Deleted

## 2017-05-27 NOTE — Anesthesia Preprocedure Evaluation (Addendum)
Anesthesia Evaluation  Patient identified by MRN, date of birth, ID band Patient awake    Reviewed: Allergy & Precautions, NPO status , Patient's Chart, lab work & pertinent test results  Airway Mallampati: II  TM Distance: >3 FB Neck ROM: Full    Dental no notable dental hx. (+) Dental Advisory Given   Pulmonary neg pulmonary ROS,    Pulmonary exam normal breath sounds clear to auscultation       Cardiovascular hypertension, Pt. on medications Normal cardiovascular exam Rhythm:Regular Rate:Normal  EKG - SR with LAFB, inferior Q waves   Neuro/Psych negative neurological ROS  negative psych ROS   GI/Hepatic Neg liver ROS, hiatal hernia, GERD  Medicated,  Endo/Other  diabetes, Type 2, Oral Hypoglycemic AgentsHypothyroidism Obesity  Renal/GU CRFRenal disease  negative genitourinary   Musculoskeletal negative musculoskeletal ROS (+)   Abdominal   Peds negative pediatric ROS (+)  Hematology  (+) anemia ,   Anesthesia Other Findings   Reproductive/Obstetrics negative OB ROS                             Anesthesia Physical  Anesthesia Plan  ASA: II  Anesthesia Plan: General   Post-op Pain Management:  Regional for Post-op pain   Induction: Intravenous  PONV Risk Score and Plan: Treatment may vary due to age or medical condition and Ondansetron  Airway Management Planned: Oral ETT  Additional Equipment: None  Intra-op Plan:   Post-operative Plan: Extubation in OR  Informed Consent: I have reviewed the patients History and Physical, chart, labs and discussed the procedure including the risks, benefits and alternatives for the proposed anesthesia with the patient or authorized representative who has indicated his/her understanding and acceptance.   Dental advisory given  Plan Discussed with: CRNA, Surgeon and Anesthesiologist  Anesthesia Plan Comments: (Case postponed. Patient  arrived with glucose > 380. Discussed at length with patient.)       Anesthesia Quick Evaluation

## 2017-05-28 ENCOUNTER — Encounter (HOSPITAL_COMMUNITY): Payer: Self-pay | Admitting: Registered Nurse

## 2017-05-28 ENCOUNTER — Inpatient Hospital Stay (HOSPITAL_COMMUNITY): Admission: RE | Admit: 2017-05-28 | Payer: Medicare Other | Source: Ambulatory Visit | Admitting: General Surgery

## 2017-05-28 ENCOUNTER — Other Ambulatory Visit: Payer: Self-pay

## 2017-05-28 ENCOUNTER — Encounter: Payer: Self-pay | Admitting: Endocrinology

## 2017-05-28 ENCOUNTER — Encounter (HOSPITAL_COMMUNITY)
Admission: RE | Disposition: A | Discharge status: Home / Self Care | Payer: Self-pay | Source: Ambulatory Visit | Attending: General Surgery

## 2017-05-28 ENCOUNTER — Encounter (HOSPITAL_COMMUNITY): Admission: RE | Payer: Self-pay | Source: Ambulatory Visit

## 2017-05-28 ENCOUNTER — Ambulatory Visit: Payer: Medicare Other | Admitting: Endocrinology

## 2017-05-28 ENCOUNTER — Ambulatory Visit (HOSPITAL_COMMUNITY)
Admission: RE | Admit: 2017-05-28 | Discharge: 2017-05-28 | Disposition: A | Discharge status: Home / Self Care | Payer: Medicare Other | Source: Ambulatory Visit | Attending: General Surgery | Admitting: General Surgery

## 2017-05-28 VITALS — BP 148/84 | HR 71 | Wt 183.0 lb

## 2017-05-28 DIAGNOSIS — Z7984 Long term (current) use of oral hypoglycemic drugs: Secondary | ICD-10-CM | POA: Diagnosis not present

## 2017-05-28 DIAGNOSIS — Z01812 Encounter for preprocedural laboratory examination: Secondary | ICD-10-CM | POA: Diagnosis not present

## 2017-05-28 DIAGNOSIS — Z9049 Acquired absence of other specified parts of digestive tract: Secondary | ICD-10-CM | POA: Insufficient documentation

## 2017-05-28 DIAGNOSIS — E1165 Type 2 diabetes mellitus with hyperglycemia: Secondary | ICD-10-CM | POA: Insufficient documentation

## 2017-05-28 DIAGNOSIS — Z794 Long term (current) use of insulin: Secondary | ICD-10-CM

## 2017-05-28 DIAGNOSIS — K432 Incisional hernia without obstruction or gangrene: Secondary | ICD-10-CM | POA: Insufficient documentation

## 2017-05-28 DIAGNOSIS — E063 Autoimmune thyroiditis: Secondary | ICD-10-CM | POA: Diagnosis not present

## 2017-05-28 DIAGNOSIS — N183 Chronic kidney disease, stage 3 unspecified: Secondary | ICD-10-CM

## 2017-05-28 DIAGNOSIS — I10 Essential (primary) hypertension: Secondary | ICD-10-CM | POA: Insufficient documentation

## 2017-05-28 DIAGNOSIS — Z5309 Procedure and treatment not carried out because of other contraindication: Secondary | ICD-10-CM | POA: Diagnosis not present

## 2017-05-28 LAB — HEMOGLOBIN A1C
HEMOGLOBIN A1C: 6.8 % — AB (ref 4.8–5.6)
Mean Plasma Glucose: 148.46 mg/dL

## 2017-05-28 LAB — GLUCOSE, CAPILLARY: Glucose-Capillary: 383 mg/dL — ABNORMAL HIGH (ref 65–99)

## 2017-05-28 SURGERY — REPAIR, HERNIA, INCISIONAL
Anesthesia: General

## 2017-05-28 MED ORDER — GABAPENTIN 300 MG PO CAPS
300.0000 mg | ORAL_CAPSULE | ORAL | Status: DC
  Filled 2017-05-28: qty 1

## 2017-05-28 MED ORDER — CHLORHEXIDINE GLUCONATE CLOTH 2 % EX PADS: 6.0000 | MEDICATED_PAD | Freq: Once | CUTANEOUS | Status: DC

## 2017-05-28 MED ORDER — ONDANSETRON HCL 4 MG/2ML IJ SOLN
INTRAMUSCULAR | Status: AC
  Filled 2017-05-28: qty 2

## 2017-05-28 MED ORDER — ACETAMINOPHEN 500 MG PO TABS
1000.0000 mg | ORAL_TABLET | ORAL | Status: DC
  Filled 2017-05-28: qty 2

## 2017-05-28 MED ORDER — CEFAZOLIN SODIUM-DEXTROSE 2-4 GM/100ML-% IV SOLN: 2.0000 g | INTRAVENOUS | Status: DC

## 2017-05-28 MED ORDER — PROPOFOL 10 MG/ML IV BOLUS
INTRAVENOUS | Status: AC
  Filled 2017-05-28: qty 20

## 2017-05-28 MED ORDER — LIDOCAINE 2% (20 MG/ML) 5 ML SYRINGE
INTRAMUSCULAR | Status: AC
  Filled 2017-05-28: qty 5

## 2017-05-28 MED ORDER — FENTANYL CITRATE (PF) 250 MCG/5ML IJ SOLN
INTRAMUSCULAR | Status: AC
  Filled 2017-05-28: qty 5

## 2017-05-28 MED ORDER — ROCURONIUM BROMIDE 10 MG/ML (PF) SYRINGE
PREFILLED_SYRINGE | INTRAVENOUS | Status: AC
  Filled 2017-05-28: qty 5

## 2017-05-28 NOTE — Patient Instructions (Addendum)
Toujeo 20 units at 3 pm with supper  IF AM SUGAR STAYS > 140 GO UP TO 22 UNITS  CALL NEXT Monday WITH 4- 5 PM SUGAR LEVELS

## 2017-05-28 NOTE — Discharge Instructions (Signed)
Call Dr. Dwyane Dee today to make appointment by the end of this week to control blood sugar. Dr. Dalbert Batman has suggested that you take all usual home medications upon arrival to home this morning.  After Dr. Dwyane Dee has instructed you on diabetic management preoperatively and has provided clearance to Dr. Dalbert Batman to proceed, call [facility] to reschedule surgery.

## 2017-05-28 NOTE — Interval H&P Note (Signed)
History and Physical Interval Note:  05/28/2017 8:07 AM  Zachary Cook  has presented today for surgery, with the diagnosis of INCISIONAL HERNIA  The various methods of treatment have been discussed with the patient and family. After consideration of risks, benefits and other options for treatment, the patient has consented to  Procedure(s) with comments: OPEN INCISIONAL HERNIA REPAIR WITH MESH (N/A) - GENERAL AND TAP BLOCK INSERTION OF MESH (N/A) - GENERAL AND TAP BLOCK as a surgical intervention .  The patient's history has been reviewed, patient examined, no change in status, stable for surgery.  I have reviewed the patient's chart and labs.  Questions were answered to the patient's satisfaction.     Adin Hector

## 2017-05-28 NOTE — Progress Notes (Addendum)
Dr. Dalbert Batman at bedside speaking with pt, agrees to cancel surgery today and call Dr. Dwyane Dee today to get appt by the end of this week to control blood sugar.  Pt was also told by Dr. Dalbert Batman to take metformin and injections for diabetes as soon as he gets home. Wife and pt both verbalize understanding of instructions.  Holly notified of cancellation and that Dr. Dalbert Batman requests for pt to be fitted with abdominal binder before discharge.   Pt wife stated that pt has not had diabetic medications for two days and that he was instructed this. Charge RN Darlene and Dr. Dalbert Batman was told this also.

## 2017-05-28 NOTE — Progress Notes (Signed)
Surgery:  Patient presented this morning for elective open ventral hernia repair with mesh.  Transverse abdominis release technique planned.  Despite active management by endocrinologist, his fasting blood sugar this morning was 384.  This is unacceptable for elective surgery with synthetic implant due to high risk of wound complications such as infection or recurrence.  This was explained to the patient and his wife.  He is discharged home with an abdominal binder. He is instructed to take his metformin and Victoza shots when he gets home He is instructed to call Dr. Dwyane Dee and make an appointment to see Dr. Dwyane Dee by Friday of this week to formulate an alternative plan for blood sugar control  I told him and his wife to contact me to reschedule his surgery when Dr. Dwyane Dee feels that his blood sugar is well controlled and everything is stable. I will notify Dr. Dwyane Dee of these events.   Edsel Petrin. Dalbert Batman, M.D., [facility] General and Minimally invasive Surgery Breast and Colorectal Surgery Office:   [phone] Pager:   [phone]

## 2017-05-28 NOTE — Progress Notes (Signed)
Patient ID: Zachary Cook, male   DOB: 04/10/1932, 84 y.o.   MRN: [id number]           Reason for Appointment: Follow-Up    History of Present Illness:          Diagnosis: Type 2 diabetes mellitus, date of diagnosis: 2005       Past history: He was started on metformin when he was found to have diabetes on routine lab work. Details of this are not available He thinks his blood sugars had been fairly well controlled for several years with metformin alone but no records are available He has had a couple of different physicians and has not always been followed consistently for his diabetes  Blood sugars had been significantly higher in the 4 months prior to his consultation in 10/15 and his A1c had gone up to 12% with his regimen of glipizide and metformin  He was given a trial of Invokana but this did not improve his blood sugars He has been on Victoza since 10/15 and this was restarted in 1/16, initially had difficulty with nausea  Recent history:    INSULIN regimen: Toujeo 18 units daily in am  Non-insulin hypoglycemic drugs : Metformin ER 500 mg, 2 tablets daily, Amaryl 2 mg at supper, Victoza 1.2 mg daily  Since his last visit in 9/18 he has had  intercurrent medical problems and did not follow-up as directed  His A1c is usually lower than expected at 6.8 now but as low as 6.3 last summer   previously but his FRUCTOSAMINE is still high at 322  Current blood sugar patterns and problems identified:  He had an increase of his Toujeo insulin again on the last visit but his blood sugars are on an average higher than before  As before he is not checking his readings after his main meal which he has in the early afternoon  His wife is with him today and says that he is eating ice cream sometimes at bedtime  Blood sugars fluctuate significantly in the morning  He was seen today urgently because he was supposed to have surgery today and his blood sugar was over 400; this was  because of his not taking his morning medications or insulin and also drinking Ensure as directed 3 hours before the surgery  He has been continued on metformin but the dose was reduced to 1000 mg because of renal dysfunction and the tablet size was reduced to 500 mg  He says he is taking his Victoza consistently as before        Glucose monitoring:  done 1 times a day         Glucometer: Accu-Chek       Blood Glucose readings  Recently readings from download  Mean values apply above for all meters except median for One Touch  PRE-MEAL Fasting Lunch Dinner Bedtime Overall  Glucose range:  98-278   165    Mean/median:  187     182   POST-MEAL PC Breakfast PC Lunch PC Dinner  Glucose range:   ?  Mean/median:        Self-care: The diet that the patient has been following is: None, usually low fat      Meals:  Mostly 1 at 2 pm, sometimes having soup or ice cream at bedtime         Exercise: Not walking recently  Dietician visit, most recent: None.  Weight history: Previous range  175-205  Wt Readings from Last 3 Encounters:  05/28/17 183 lb (83 kg)  05/28/17 180 lb (81.6 kg)  05/20/17 180 lb (81.6 kg)    Glycemic control:   Lab Results  Component Value Date   HGBA1C 6.8 (H) 05/28/2017   HGBA1C 6.5 10/14/2016   HGBA1C 6.3 07/12/2016   Lab Results  Component Value Date   MICROALBUR 14.0 (H) 07/12/2016   LDLCALC 89 05/12/2017   CREATININE 1.57 (H) 05/20/2017    Lab Results  Component Value Date   FRUCTOSAMINE 322 (H) 05/05/2017   FRUCTOSAMINE 323 (H) 11/28/2016   FRUCTOSAMINE 310 (H) 05/03/2016    OTHER active problems: See review of systems   Admission on 05/28/2017, Discharged on 05/28/2017  Component Date Value Ref Range Status  . Hgb A1c MFr Bld 05/28/2017 6.8* 4.8 - 5.6 % Final   Comment: (NOTE) Pre diabetes:          5.7%-6.4% Diabetes:              >6.4% Glycemic control for   <7.0% adults with diabetes   . Mean Plasma Glucose  05/28/2017 148.46  mg/dL Final   Performed at [facility] [address], [place], [place] [address]  . Glucose-Capillary 05/28/2017 383* 65 - 99 mg/dL Final      Allergies as of 05/28/2017   No Known Allergies     Medication List        Accurate as of 05/28/17  2:18 PM. Always use your most recent med list.          ACCU-CHEK NANO SMARTVIEW w/Device Kit   ACCU-CHEK SMARTVIEW test strip Generic drug:  glucose blood USE TO TEST TWICE DAILY   amLODipine 5 MG tablet Commonly known as:  NORVASC TAKE 1 TABLET(5 MG) BY MOUTH DAILY   aspirin EC 81 MG tablet Take 81 mg by mouth daily.   atorvastatin 10 MG tablet Commonly known as:  LIPITOR Take 1 tablet (10 mg total) by mouth daily at 6 PM.   B-D UF III MINI PEN NEEDLES 31G X 5 MM Misc Generic drug:  Insulin Pen Needle USE ONE PER DAY WITH VICTOZA   esomeprazole 40 MG capsule Commonly known as:  NEXIUM TAKE ONE CAPSULE BY MOUTH DAILY AT NOON   Fish Oil 1200 MG Caps Take 1,200 mg by mouth every evening.   glimepiride 2 MG tablet Commonly known as:  AMARYL TAKE 2 TABLETS(4 MG) BY MOUTH DAILY BEFORE DINNER   levothyroxine 150 MCG tablet Commonly known as:  SYNTHROID, LEVOTHROID Take 1 tablet (150 mcg total) by mouth daily.   metFORMIN 500 MG 24 hr tablet Commonly known as:  GLUCOPHAGE-XR Take 2 tablets (1,000 mg total) by mouth daily with supper.   MULTIVITAMIN ADULT Tabs Take 1 tablet by mouth every evening.   tamsulosin 0.4 MG Caps capsule Commonly known as:  FLOMAX TAKE ONE CAPSULE BY MOUTH DAILY   TOUJEO SOLOSTAR 300 UNIT/ML Sopn Generic drug:  Insulin Glargine INJECT 12 UNITS INTO THE SKIN DAILY   VICTOZA 18 MG/3ML Sopn Generic drug:  liraglutide ADMINISTER 1.2 MG UNDER THE SKIN DAILY       Allergies: No Known Allergies  Past Medical History:  Diagnosis Date  . Barrett's esophagus   . Bilateral inguinal hernia   . Cardiomegaly 02/21/2017   noted on CXR  . Cholelithiasis   . Chronic  kidney disease    stage 3  . Diabetes mellitus without complication ([place])   .  Diverticulosis   . Duodenitis   . GERD (gastroesophageal reflux disease)   . Hiatal hernia   . History of blood transfusion 05/2016  . History of GI bleed   . History of hyperkalemia 02/2017  . Hyperlipemia   . Hypertension   . Hypogonadism in male   . Hypothyroidism   . Ileus following gastrointestinal surgery 06/12/2016  . Skin cancer    right arm treated by dermatologist  . Syncope 06/03/2016  . Umbilical hernia    Small  . Ventral hernia    Anterior    Past Surgical History:  Procedure Laterality Date  . COLONOSCOPY    . ESOPHAGOGASTRODUODENOSCOPY    . IR GENERIC HISTORICAL  05/28/2016   IR ANGIOGRAM SELECTIVE EACH ADDITIONAL VESSEL 05/28/2016 Aletta Edouard, MD [facility]  . IR GENERIC HISTORICAL  05/28/2016   IR ANGIOGRAM VISCERAL SELECTIVE 05/28/2016 Aletta Edouard, MD [facility]  . IR GENERIC HISTORICAL  05/28/2016   IR US GUIDE VASC ACCESS RIGHT 05/28/2016 Aletta Edouard, MD [facility]  . IR GENERIC HISTORICAL  05/28/2016   IR ANGIOGRAM VISCERAL SELECTIVE 05/28/2016 Arne Cleveland, MD [facility]  . IR GENERIC HISTORICAL  05/28/2016   IR ANGIOGRAM SELECTIVE EACH ADDITIONAL VESSEL 05/28/2016 Arne Cleveland, MD [facility]  . IR GENERIC HISTORICAL  05/28/2016   IR EMBO ART  VEN HEMORR LYMPH EXTRAV  INC GUIDE ROADMAPPING 05/28/2016 Arne Cleveland, MD [facility]  . IR GENERIC HISTORICAL  05/28/2016   IR US GUIDE VASC ACCESS RIGHT 05/28/2016 Arne Cleveland, MD [facility]  . PARTIAL COLECTOMY N/A 06/06/2016   Procedure: OPEN ASCENDING COLECTOMY;  Surgeon: Stark Klein, MD;  Location: [facility];  Service: General;  Laterality: N/A;  . VASECTOMY      Family History  Problem Relation Age of Onset  . Heart disease Mother   . Diabetes Mother   . Cancer Father   . Heart attack Son     Social History:  reports that  has never smoked. he has never used smokeless tobacco. He reports that he  does not drink alcohol or use drugs.    Review of Systems  The following information was adapted from previous notes and updated and modified for up-to-date information and history   HYPERTENSION: Followed by PCP     BP Readings from Last 3 Encounters:  05/28/17 (!) 148/84  05/28/17 (!) 183/87  05/20/17 (!) 153/82   RENAL dysfunction and hyperkalemia: Potassium is normal but creatinine is persistently high He has seen a nephrologist and no recommendations make  Lab Results  Component Value Date   CREATININE 1.57 (H) 05/20/2017   CREATININE 1.52 (H) 05/05/2017   CREATININE 1.58 (H) 04/14/2017     Lab Results  Component Value Date   CREATININE 1.57 (H) 05/20/2017   BUN 34 (H) 05/20/2017   NA 141 05/20/2017   K 4.5 05/20/2017   CL 112 (H) 05/20/2017   CO2 24 05/20/2017          Lipids: Most recent labs as below, now taking Lipitor instead of Zocor that had been prescribed before by his PCP Recent labs:       Lab Results  Component Value Date   CHOL 154 05/12/2017   HDL 40 05/12/2017   LDLCALC 89 05/12/2017   LDLDIRECT 125.0 12/14/2014   TRIG 127 05/12/2017   CHOLHDL 3.9 05/12/2017                  Thyroid:  He   was found to  have hypothyroidism about the year 2000 on routine labs without symptoms. Apparently he did not  feel any different with taking the thyroid supplement.   His TSH is persistently high, on his last visit he was told to increase his dose to 150 mcg because of persistently high TSH Also difficult to know from his history whether he takes his supplement regularly or not     Labs as follows:  Lab Results  Component Value Date   TSH 20.13 (H) 05/05/2017   TSH 20.98 (H) 11/28/2016   TSH 27.69 (H) 10/14/2016   FREET4 0.75 05/05/2017   FREET4 0.65 11/28/2016   FREET4 0.58 (L) 10/14/2016       He has been told to have hypogonadism of unclear etiology for the last few years.  He has been  reluctant to go back on AndroGel   Lab Results   Component Value Date   TESTOSTERONE 174.72 (L) 12/14/2014     LABS:  Admission on 05/28/2017, Discharged on 05/28/2017  Component Date Value Ref Range Status  . Hgb A1c MFr Bld 05/28/2017 6.8* 4.8 - 5.6 % Final   Comment: (NOTE) Pre diabetes:          5.7%-6.4% Diabetes:              >6.4% Glycemic control for   <7.0% adults with diabetes   . Mean Plasma Glucose 05/28/2017 148.46  mg/dL Final   Performed at [facility] [address], [place], [place] [address]  . Glucose-Capillary 05/28/2017 383* 65 - 99 mg/dL Final    Physical Examination:  BP (!) 148/84 (BP Location: Left Arm, Patient Position: Sitting, Cuff Size: Normal)   Pulse 71   Wt 183 lb (83 kg)   SpO2 98%   BMI 31.41 kg/m    ASSESSMENT:  Diabetes type 2, BMI 30 See history of present illness for detailed discussion of current diabetes management, blood sugar patterns and problems identified  He is here for short-term follow-up because of not being able to get his surgery done today  His blood sugars are not well controlled but his fasting readings are quite variable As expected his A1c is lower than his blood sugar average of 182  Have discussed with him previously that he may be having high readings after meals but because of not checking readings after his main meal in the afternoon not clear if he needs mealtime insulin or not  He is also likely having high readings sometimes when he is eating a large snack late at night Although his blood sugars are probably reasonable for his age he needs better controlled in order to get his surgery done   Hypothyroidism: Will need follow-up TSH on the next visit with his higher dose  Hypertension: This is to be followed up by his PCP, no changes made by nephrologist apparently  PLAN:  His wife was present and was explained the differences between fasting and postprandial readings Discussed also the differences between basal and bolus insulin and he is  only taking basal insulin that will not cover his meals or snacks Explained to him when to check the sugar after meals on the blood sugar targets We will discuss the results of his postprandial monitor next week If this is showing consistently high readings at least over 170 or so we will have him start mealtime coverage before dinner and with any large snack like ice cream at night  Meanwhile he will take 20 units of Toujeo but  next week may do 22 units if fasting readings are staying over 140 He will also change his insulin regimen to suppertime so that he will not skip the dose on the day of surgery Also on the day of surgery he will not drink any insulin  Continue metformin and Victoza unchanged; we will try to follow-up in 2 weeks    Patient Instructions  Toujeo 20 units at 3 pm with supper  IF AM SUGAR STAYS > 140 GO UP TO 22 UNITS  CALL NEXT Monday WITH 4- 5 PM SUGAR LEVELS      Elayne Snare 05/28/2017, 2:18 PM   Note: This office note was prepared with Dragon voice recognition system technology. Any transcriptional errors that result from this process are unintentional.

## 2017-05-28 NOTE — Progress Notes (Signed)
Dr. Dalbert Batman instructed to have pt fitted with Abdominal binder.  Placed one on patient before discharge.  Pt brought to main entrance and was assisted to vehicle.  Wife driving pt home.

## 2017-05-28 NOTE — Progress Notes (Addendum)
Dr. Fransisco Beau at bedside speaking to pt, aware of hyperglycemia this morning after carb loading drink.   Surgery cancelled at this time, pt aware.

## 2017-05-28 NOTE — Progress Notes (Addendum)
Spoke with Dr. Dalbert Batman regarding pt blood sugar this morning, agrees that surgery will be cancelled today. Will still come to speak to pt before pt departure.

## 2017-05-31 ENCOUNTER — Other Ambulatory Visit: Payer: Self-pay | Admitting: Family Medicine

## 2017-06-01 ENCOUNTER — Other Ambulatory Visit: Payer: Self-pay | Admitting: Family Medicine

## 2017-06-01 DIAGNOSIS — E785 Hyperlipidemia, unspecified: Secondary | ICD-10-CM

## 2017-06-02 ENCOUNTER — Telehealth: Payer: Self-pay | Admitting: Endocrinology

## 2017-06-02 NOTE — Telephone Encounter (Signed)
Patients wife would like a call back to give the Lastest blood sugar readings for Dr Dwyane Dee.   Please advise

## 2017-06-02 NOTE — Telephone Encounter (Signed)
tamsulosin refill Last OV: 04/14/17 Last Refill:05/05/17 Pharmacy: [place], [place]

## 2017-06-05 NOTE — Telephone Encounter (Signed)
Please advise if pt needs OV. Do not see need for this medication discussed in last 2 office visits.

## 2017-06-06 NOTE — Telephone Encounter (Signed)
flomax refilled, but please follow up in next 3 months to discuss that medication.

## 2017-06-13 ENCOUNTER — Other Ambulatory Visit (INDEPENDENT_AMBULATORY_CARE_PROVIDER_SITE_OTHER): Payer: Medicare Other

## 2017-06-13 DIAGNOSIS — Z794 Long term (current) use of insulin: Secondary | ICD-10-CM | POA: Diagnosis not present

## 2017-06-13 DIAGNOSIS — E1165 Type 2 diabetes mellitus with hyperglycemia: Secondary | ICD-10-CM | POA: Diagnosis not present

## 2017-06-13 DIAGNOSIS — E063 Autoimmune thyroiditis: Secondary | ICD-10-CM

## 2017-06-13 LAB — BASIC METABOLIC PANEL
BUN: 26 mg/dL — ABNORMAL HIGH (ref 6–23)
CALCIUM: 9.8 mg/dL (ref 8.4–10.5)
CO2: 28 meq/L (ref 19–32)
CREATININE: 1.44 mg/dL (ref 0.40–1.50)
Chloride: 106 mEq/L (ref 96–112)
GFR: 49.81 mL/min — AB (ref >60.00)
GLUCOSE: 135 mg/dL — AB (ref 70–99)
Potassium: 5 mEq/L (ref 3.5–5.1)
Sodium: 141 mEq/L (ref 135–145)

## 2017-06-13 LAB — T4, FREE: Free T4: 0.85 ng/dL (ref 0.60–1.60)

## 2017-06-13 LAB — TSH: TSH: 3.06 u[IU]/mL (ref 0.35–4.50)

## 2017-06-13 LAB — HEMOGLOBIN A1C: Hgb A1c MFr Bld: 7.1 % — ABNORMAL HIGH (ref 4.6–6.5)

## 2017-06-16 ENCOUNTER — Ambulatory Visit: Payer: Medicare Other | Admitting: Endocrinology

## 2017-06-16 ENCOUNTER — Encounter: Payer: Self-pay | Admitting: Endocrinology

## 2017-06-16 VITALS — BP 148/86 | HR 68 | Ht 64.0 in | Wt 181.0 lb

## 2017-06-16 DIAGNOSIS — Z794 Long term (current) use of insulin: Secondary | ICD-10-CM | POA: Diagnosis not present

## 2017-06-16 DIAGNOSIS — E1165 Type 2 diabetes mellitus with hyperglycemia: Secondary | ICD-10-CM | POA: Diagnosis not present

## 2017-06-16 LAB — GLUCOSE, POCT (MANUAL RESULT ENTRY): POC Glucose: 155 mg/dl — AB (ref 70–99)

## 2017-06-16 NOTE — Progress Notes (Signed)
Patient ID: Zachary Cook, male   DOB: 1932-09-04, 84 y.o.   MRN: [id number]           Reason for Appointment: Follow-Up    History of Present Illness:          Diagnosis: Type 2 diabetes mellitus, date of diagnosis: 2005       Past history: He was started on metformin when he was found to have diabetes on routine lab work. Details of this are not available He thinks his blood sugars had been fairly well controlled for several years with metformin alone but no records are available He has had a couple of different physicians and has not always been followed consistently for his diabetes  Blood sugars had been significantly higher in the 4 months prior to his consultation in 10/15 and his A1c had gone up to 12% with his regimen of glipizide and metformin  He was given a trial of Invokana but this did not improve his blood sugars He has been on Victoza since 10/15 and this was restarted in 1/16, initially had difficulty with nausea  Recent history:    INSULIN regimen: Toujeo 20 units daily around 6 PM  Non-insulin hypoglycemic drugs : Metformin ER 500 mg, 2 tablets daily, Amaryl 2 mg at supper, Victoza 1.2 mg daily   His A1c is relatively higher at 7.1   Current blood sugar patterns and problems identified:  He had been advised to check his blood sugars after meals since he was having hyperglycemia with increased fructosamine on his last visit  However even though his wife was present on the last visit to remind him she has not made him do any blood sugars at night  His Toujeo was changed to the evening and the dose increased to 20 units since he had a relatively higher fasting readings averaging around 200 on the last visit  FASTING readings are overall better although not consistent, has had one reading of 86 and lab glucose was 135, recently HIGHEST 192  He has cut back on large snacks like ice cream at bedtime  An increase of his Toujeo insulin again on the last visit  but his blood sugars are on an average higher than before  As before he is not checking his readings after his main meal which he has in the early afternoon  His wife is with him today and says that     he is eating ice cream sometimes at bedtime  Blood sugars fluctuate significantly in the morning  He was seen today urgently because he was supposed to have surgery today and his blood sugar was over 400; this was because of his not taking his morning medications or insulin and also drinking Ensure as directed 3 hours before the surgery  He has been continued on metformin but the dose was reduced to 1000 mg because of renal dysfunction and the tablet size was reduced to 500 mg  He says he is taking his Victoza consistently as before        Glucose monitoring:  done 1 times a day         Glucometer: Accu-Chek       Blood Glucose readings  Recently as above   Average for the last 30 days 167   Self-care: The diet that the patient has been following is: None, usually low fat      Meals:  Mostly 1 at 2 pm, sometimes having soup or ice cream at bedtime  Exercise: Not walking recently  Dietician visit, most recent: None.               Weight history: Previous range  175-205  Wt Readings from Last 3 Encounters:  06/16/17 181 lb (82.1 kg)  05/28/17 183 lb (83 kg)  05/28/17 180 lb (81.6 kg)    Glycemic control:   Lab Results  Component Value Date   HGBA1C 7.1 (H) 06/13/2017   HGBA1C 6.8 (H) 05/28/2017   HGBA1C 6.5 10/14/2016   Lab Results  Component Value Date   MICROALBUR 14.0 (H) 07/12/2016   LDLCALC 89 05/12/2017   CREATININE 1.44 06/13/2017    Lab Results  Component Value Date   FRUCTOSAMINE 322 (H) 05/05/2017   FRUCTOSAMINE 323 (H) 11/28/2016   FRUCTOSAMINE 310 (H) 05/03/2016    OTHER active problems: See review of systems   Office Visit on 06/16/2017  Component Date Value Ref Range Status  . POC Glucose 06/16/2017 155* 70 - 99 mg/dl Final  Lab on  06/13/2017  Component Date Value Ref Range Status  . Free T4 06/13/2017 0.85  0.60 - 1.60 ng/dL Final   Comment: Specimens from patients who are undergoing biotin therapy and /or ingesting biotin supplements may contain high levels of biotin.  The higher biotin concentration in these specimens interferes with this Free T4 assay.  Specimens that contain high levels  of biotin may cause false high results for this Free T4 assay.  Please interpret results in light of the total clinical presentation of the patient.    Marland Kitchen TSH 06/13/2017 3.06  0.35 - 4.50 uIU/mL Final  . Sodium 06/13/2017 141  135 - 145 mEq/L Final  . Potassium 06/13/2017 5.0  3.5 - 5.1 mEq/L Final  . Chloride 06/13/2017 106  96 - 112 mEq/L Final  . CO2 06/13/2017 28  19 - 32 mEq/L Final  . Glucose, Bld 06/13/2017 135* 70 - 99 mg/dL Final  . BUN 06/13/2017 26* 6 - 23 mg/dL Final  . Creatinine, Ser 06/13/2017 1.44  0.40 - 1.50 mg/dL Final  . Calcium 06/13/2017 9.8  8.4 - 10.5 mg/dL Final  . GFR 06/13/2017 49.81* >60.00 mL/min Final  . Hgb A1c MFr Bld 06/13/2017 7.1* 4.6 - 6.5 % Final   Glycemic Control Guidelines for People with Diabetes:Non Diabetic:  <6%Goal of Therapy: <7%Additional Action Suggested:  >8%       Allergies as of 06/16/2017   No Known Allergies     Medication List        Accurate as of 06/16/17  1:05 PM. Always use your most recent med list.          ACCU-CHEK NANO SMARTVIEW w/Device Kit   ACCU-CHEK SMARTVIEW test strip Generic drug:  glucose blood USE TO TEST TWICE DAILY   amLODipine 5 MG tablet Commonly known as:  NORVASC TAKE 1 TABLET(5 MG) BY MOUTH DAILY   aspirin EC 81 MG tablet Take 81 mg by mouth daily.   atorvastatin 10 MG tablet Commonly known as:  LIPITOR Take 1 tablet (10 mg total) by mouth daily at 6 PM.   B-D UF III MINI PEN NEEDLES 31G X 5 MM Misc Generic drug:  Insulin Pen Needle USE ONE PER DAY WITH VICTOZA   esomeprazole 40 MG capsule Commonly known as:  NEXIUM TAKE ONE  CAPSULE BY MOUTH DAILY AT NOON   Fish Oil 1200 MG Caps Take 1,200 mg by mouth every evening.   glimepiride 2 MG tablet Commonly known as:  AMARYL  TAKE 2 TABLETS(4 MG) BY MOUTH DAILY BEFORE DINNER   levothyroxine 150 MCG tablet Commonly known as:  SYNTHROID, LEVOTHROID Take 1 tablet (150 mcg total) by mouth daily.   metFORMIN 500 MG 24 hr tablet Commonly known as:  GLUCOPHAGE-XR Take 2 tablets (1,000 mg total) by mouth daily with supper.   MULTIVITAMIN ADULT Tabs Take 1 tablet by mouth every evening.   tamsulosin 0.4 MG Caps capsule Commonly known as:  FLOMAX TAKE ONE CAPSULE BY MOUTH DAILY   TOUJEO SOLOSTAR 300 UNIT/ML Sopn Generic drug:  Insulin Glargine INJECT 12 UNITS INTO THE SKIN DAILY   VICTOZA 18 MG/3ML Sopn Generic drug:  liraglutide ADMINISTER 1.2 MG UNDER THE SKIN DAILY       Allergies: No Known Allergies  Past Medical History:  Diagnosis Date  . Barrett's esophagus   . Bilateral inguinal hernia   . Cardiomegaly 02/21/2017   noted on CXR  . Cholelithiasis   . Chronic kidney disease    stage 3  . Diabetes mellitus without complication ([place])   . Diverticulosis   . Duodenitis   . GERD (gastroesophageal reflux disease)   . Hiatal hernia   . History of blood transfusion 05/2016  . History of GI bleed   . History of hyperkalemia 02/2017  . Hyperlipemia   . Hypertension   . Hypogonadism in male   . Hypothyroidism   . Ileus following gastrointestinal surgery ([place]) 06/12/2016  . Skin cancer    right arm treated by dermatologist  . Syncope 06/03/2016  . Umbilical hernia    Small  . Ventral hernia    Anterior    Past Surgical History:  Procedure Laterality Date  . COLONOSCOPY    . ESOPHAGOGASTRODUODENOSCOPY    . IR GENERIC HISTORICAL  05/28/2016   IR ANGIOGRAM SELECTIVE EACH ADDITIONAL VESSEL 05/28/2016 Aletta Edouard, MD [facility]  . IR GENERIC HISTORICAL  05/28/2016   IR ANGIOGRAM VISCERAL SELECTIVE 05/28/2016 Aletta Edouard, MD [facility]  . IR GENERIC HISTORICAL  05/28/2016   IR US GUIDE VASC ACCESS RIGHT 05/28/2016 Aletta Edouard, MD [facility]  . IR GENERIC HISTORICAL  05/28/2016   IR ANGIOGRAM VISCERAL SELECTIVE 05/28/2016 Arne Cleveland, MD [facility]  . IR GENERIC HISTORICAL  05/28/2016   IR ANGIOGRAM SELECTIVE EACH ADDITIONAL VESSEL 05/28/2016 Arne Cleveland, MD [facility]  . IR GENERIC HISTORICAL  05/28/2016   IR EMBO ART  VEN HEMORR LYMPH EXTRAV  INC GUIDE ROADMAPPING 05/28/2016 Arne Cleveland, MD [facility]  . IR GENERIC HISTORICAL  05/28/2016   IR US GUIDE VASC ACCESS RIGHT 05/28/2016 Arne Cleveland, MD [facility]  . PARTIAL COLECTOMY N/A 06/06/2016   Procedure: OPEN ASCENDING COLECTOMY;  Surgeon: Stark Klein, MD;  Location: [facility];  Service: General;  Laterality: N/A;  . VASECTOMY      Family History  Problem Relation Age of Onset  . Heart disease Mother   . Diabetes Mother   . Cancer Father   . Heart attack Son     Social History:  reports that he has never smoked. He has never used smokeless tobacco. He reports that he does not drink alcohol or use drugs.    Review of Systems   The following information was adapted from previous notes and updated and modified for up-to-date information and history   HYPERTENSION: Followed by PCP Missed amlodipine for the last 2 days   BP Readings from Last 3 Encounters:  06/16/17 (!) 148/86  05/28/17 (!) 148/84  05/28/17 (!) 183/87  RENAL dysfunction  This is slightly better on the potassium is still high normal He has seen a nephrologist and no recommendations make  Lab Results  Component Value Date   CREATININE 1.44 06/13/2017   CREATININE 1.57 (H) 05/20/2017   CREATININE 1.52 (H) 05/05/2017     Lab Results  Component Value Date   CREATININE 1.44 06/13/2017   BUN 26 (H) 06/13/2017   NA 141 06/13/2017   K 5.0 06/13/2017   CL 106 06/13/2017   CO2 28 06/13/2017          Lipids: Most recent labs as below, now taking Lipitor Recent  labs:       Lab Results  Component Value Date   CHOL 154 05/12/2017   HDL 40 05/12/2017   LDLCALC 89 05/12/2017   LDLDIRECT 125.0 12/14/2014   TRIG 127 05/12/2017   CHOLHDL 3.9 05/12/2017                  Thyroid:  He   was found to have hypothyroidism about the year 2000 on routine labs without symptoms. Apparently he did not  feel any different with taking the thyroid supplement.   His TSH had been persistently high, on his last visit he was told to increase his dose to 150 mcg because of persistently high TSH This is back to normal although he does do not think he feels any better   Labs as follows:  Lab Results  Component Value Date   TSH 3.06 06/13/2017   TSH 20.13 (H) 05/05/2017   TSH 20.98 (H) 11/28/2016   FREET4 0.85 06/13/2017   FREET4 0.75 05/05/2017   FREET4 0.65 11/28/2016       He has been told to have hypogonadism of unclear etiology for the last few years.  He has been  reluctant to go back on AndroGel as he does not think he has enough symptoms   Lab Results  Component Value Date   TESTOSTERONE 174.72 (L) 12/14/2014     LABS:  Office Visit on 06/16/2017  Component Date Value Ref Range Status  . POC Glucose 06/16/2017 155* 70 - 99 mg/dl Final  Lab on 06/13/2017  Component Date Value Ref Range Status  . Free T4 06/13/2017 0.85  0.60 - 1.60 ng/dL Final   Comment: Specimens from patients who are undergoing biotin therapy and /or ingesting biotin supplements may contain high levels of biotin.  The higher biotin concentration in these specimens interferes with this Free T4 assay.  Specimens that contain high levels  of biotin may cause false high results for this Free T4 assay.  Please interpret results in light of the total clinical presentation of the patient.    Marland Kitchen TSH 06/13/2017 3.06  0.35 - 4.50 uIU/mL Final  . Sodium 06/13/2017 141  135 - 145 mEq/L Final  . Potassium 06/13/2017 5.0  3.5 - 5.1 mEq/L Final  . Chloride 06/13/2017 106  96 - 112  mEq/L Final  . CO2 06/13/2017 28  19 - 32 mEq/L Final  . Glucose, Bld 06/13/2017 135* 70 - 99 mg/dL Final  . BUN 06/13/2017 26* 6 - 23 mg/dL Final  . Creatinine, Ser 06/13/2017 1.44  0.40 - 1.50 mg/dL Final  . Calcium 06/13/2017 9.8  8.4 - 10.5 mg/dL Final  . GFR 06/13/2017 49.81* >60.00 mL/min Final  . Hgb A1c MFr Bld 06/13/2017 7.1* 4.6 - 6.5 % Final   Glycemic Control Guidelines for People with Diabetes:Non Diabetic:  <6%Goal of Therapy: <7%Additional Action  Suggested:  >8%     Physical Examination:  BP (!) 148/86 (BP Location: Left Arm, Patient Position: Sitting, Cuff Size: Normal)   Pulse 68   Ht _0  (1.626 m)   Wt 181 lb (82.1 kg)   SpO2 97%   BMI 31.07 kg/m    ASSESSMENT:  Diabetes type 2, insulin requiring  See history of present illness for detailed discussion of current diabetes management, blood sugar patterns and problems identified  Although he had marked increase in his blood sugars on the last visit and had to postpone his surgery his blood sugars appear to be getting better now He is cutting back on large snacks at bedtime However still not able to figure out whether he has high readings after meals since he forgets to check the readings Currently he thinks he is eating mostly one meal in the afternoon  Although his fasting readings are still overall averaging about 160 have been near normal a couple of times and will not increase his insulin for safety reasons   Hypothyroidism: TSH is back to normal with 150 mcg and he will continue this  Hypertension: This is to be followed up by his PCP, advised him to stay regular with his medication and not run out like he is currently   PLAN:   He can proceed with his hernia surgery He would make sure that he takes his metformin and insulin the day before surgery Again reminded him that he can try checking his blood sugars when he takes his insulin at 6 PM If his blood sugars are consistently high he will  call   Patient Instructions  Must check sugar when taking insulin        Elayne Snare 06/16/2017, 1:05 PM   Note: This office note was prepared with Dragon voice recognition system technology. Any transcriptional errors that result from this process are unintentional.

## 2017-06-16 NOTE — Patient Instructions (Signed)
Must check sugar when taking insulin

## 2017-06-20 ENCOUNTER — Other Ambulatory Visit: Payer: Self-pay | Admitting: Endocrinology

## 2017-06-24 ENCOUNTER — Other Ambulatory Visit: Payer: Self-pay | Admitting: General Surgery

## 2017-06-30 ENCOUNTER — Other Ambulatory Visit: Payer: Self-pay

## 2017-07-02 ENCOUNTER — Ambulatory Visit: Payer: Self-pay | Admitting: Endocrinology

## 2017-07-06 ENCOUNTER — Other Ambulatory Visit: Payer: Self-pay | Admitting: Endocrinology

## 2017-07-07 ENCOUNTER — Other Ambulatory Visit: Payer: Self-pay | Admitting: Family Medicine

## 2017-07-07 DIAGNOSIS — E785 Hyperlipidemia, unspecified: Secondary | ICD-10-CM

## 2017-07-14 ENCOUNTER — Other Ambulatory Visit: Payer: Self-pay | Admitting: Family Medicine

## 2017-07-16 ENCOUNTER — Other Ambulatory Visit: Payer: Self-pay | Admitting: Endocrinology

## 2017-07-17 ENCOUNTER — Other Ambulatory Visit: Payer: Self-pay | Admitting: Endocrinology

## 2017-07-24 ENCOUNTER — Other Ambulatory Visit: Payer: Self-pay | Admitting: Endocrinology

## 2017-07-24 ENCOUNTER — Other Ambulatory Visit (INDEPENDENT_AMBULATORY_CARE_PROVIDER_SITE_OTHER): Payer: Medicare Other

## 2017-07-24 DIAGNOSIS — Z794 Long term (current) use of insulin: Secondary | ICD-10-CM

## 2017-07-24 DIAGNOSIS — E1165 Type 2 diabetes mellitus with hyperglycemia: Secondary | ICD-10-CM

## 2017-07-24 DIAGNOSIS — E063 Autoimmune thyroiditis: Secondary | ICD-10-CM

## 2017-07-24 LAB — BASIC METABOLIC PANEL
BUN: 33 mg/dL — AB (ref 6–23)
CALCIUM: 9.7 mg/dL (ref 8.4–10.5)
CO2: 26 mEq/L (ref 19–32)
CREATININE: 1.46 mg/dL (ref 0.40–1.50)
Chloride: 108 mEq/L (ref 96–112)
GFR: 49.01 mL/min — AB (ref >60.00)
Glucose, Bld: 170 mg/dL — ABNORMAL HIGH (ref 70–99)
Potassium: 5.4 mEq/L — ABNORMAL HIGH (ref 3.5–5.1)
Sodium: 141 mEq/L (ref 135–145)

## 2017-07-24 LAB — MICROALBUMIN / CREATININE URINE RATIO
CREATININE, U: 184.4 mg/dL
MICROALB/CREAT RATIO: 29.3 mg/g (ref 0.0–30.0)
Microalb, Ur: 54.1 mg/dL — ABNORMAL HIGH (ref 0.0–1.9)

## 2017-07-24 LAB — URINALYSIS, ROUTINE W REFLEX MICROSCOPIC
BILIRUBIN URINE: NEGATIVE
LEUKOCYTES UA: NEGATIVE
NITRITE: NEGATIVE
PH: 5.5 (ref 5.0–8.0)
Specific Gravity, Urine: 1.025 (ref 1.000–1.030)
TOTAL PROTEIN, URINE-UPE24: 100 — AB
UROBILINOGEN UA: 0.2 (ref 0.0–1.0)
Urine Glucose: NEGATIVE

## 2017-07-24 LAB — TSH: TSH: 0.37 u[IU]/mL (ref 0.35–4.50)

## 2017-07-24 NOTE — Patient Instructions (Addendum)
Zachary Cook [facility]  07/24/2017   Your procedure is scheduled on: 08-02-27  Report to [facility] Main  Entrance  Report to admitting at     0630AM    Call this number if you have problems the morning of surgery [phone]   Remember: Do not eat food or drink liquids :After Midnight.     Take these medicines the morning of surgery with A SIP OF WATER: levothyroxine, amlodipine DO NOT TAKE ANY DIABETIC MEDICATIONS DAY OF YOUR SURGERY                               You may not have any metal on your body including hair pins and              piercings  Do not wear jewelry, , lotions, powders or perfumes, deodorant                  Men may shave face and neck.   Do not bring valuables to the hospital. [place].  Contacts, dentures or bridgework may not be worn into surgery.  Leave suitcase in the car. After surgery it may be brought to your room.                  Please read over the following fact sheets you were given: _____________________________________________________________________           [facility] - Preparing for Surgery Before surgery, you can play an important role.  Because skin is not sterile, your skin needs to be as free of germs as possible.  You can reduce the number of germs on your skin by washing with CHG (chlorahexidine gluconate) soap before surgery.  CHG is an antiseptic cleaner which kills germs and bonds with the skin to continue killing germs even after washing. Please DO NOT use if you have an allergy to CHG or antibacterial soaps.  If your skin becomes reddened/irritated stop using the CHG and inform your nurse when you arrive at Short Stay. Do not shave (including legs and underarms) for at least 48 hours prior to the first CHG shower.  You may shave your face/neck. Please follow these instructions carefully:  1.  Shower with CHG Soap the night before surgery and the  morning of  Surgery.  2.  If you choose to wash your hair, wash your hair first as usual with your  normal  shampoo.  3.  After you shampoo, rinse your hair and body thoroughly to remove the  shampoo.                           4.  Use CHG as you would any other liquid soap.  You can apply chg directly  to the skin and wash                       Gently with a scrungie or clean washcloth.  5.  Apply the CHG Soap to your body ONLY FROM THE NECK DOWN.   Do not use on face/ open  Wound or open sores. Avoid contact with eyes, ears mouth and genitals (private parts).                       Wash face,  Genitals (private parts) with your normal soap.             6.  Wash thoroughly, paying special attention to the area where your surgery  will be performed.  7.  Thoroughly rinse your body with warm water from the neck down.  8.  DO NOT shower/wash with your normal soap after using and rinsing off  the CHG Soap.                9.  Pat yourself dry with a clean towel.            10.  Wear clean pajamas.            11.  Place clean sheets on your bed the night of your first shower and do not  sleep with pets. Day of Surgery : Do not apply any lotions/deodorants the morning of surgery.  Please wear clean clothes to the hospital/surgery center.  FAILURE TO FOLLOW THESE INSTRUCTIONS MAY RESULT IN THE CANCELLATION OF YOUR SURGERY PATIENT SIGNATURE_________________________________  NURSE SIGNATURE__________________________________  ________________________________________________________________________ How to Manage Your Diabetes Before and After Surgery  Why is it important to control my blood sugar before and after surgery? . Improving blood sugar levels before and after surgery helps healing and can limit problems. . A way of improving blood sugar control is eating a healthy diet by: o  Eating less sugar and carbohydrates o  Increasing activity/exercise o  Talking with your doctor about  reaching your blood sugar goals . High blood sugars (greater than 180 mg/dL) can raise your risk of infections and slow your recovery, so you will need to focus on controlling your diabetes during the weeks before surgery. . Make sure that the doctor who takes care of your diabetes knows about your planned surgery including the date and location.  How do I manage my blood sugar before surgery? . Check your blood sugar at least 4 times a day, starting 2 days before surgery, to make sure that the level is not too high or low. o Check your blood sugar the morning of your surgery when you wake up and every 2 hours until you get to the Short Stay unit. . If your blood sugar is less than 70 mg/dL, you will need to treat for low blood sugar: o Do not take insulin. o Treat a low blood sugar (less than 70 mg/dL) with  cup of clear juice (cranberry or apple), 4 glucose tablets, OR glucose gel. o Recheck blood sugar in 15 minutes after treatment (to make sure it is greater than 70 mg/dL). If your blood sugar is not greater than 70 mg/dL on recheck, call [phone] for further instructions. . Report your blood sugar to the short stay nurse when you get to Short Stay.  . If you are admitted to the hospital after surgery: o Your blood sugar will be checked by the staff and you will probably be given insulin after surgery (instead of oral diabetes medicines) to make sure you have good blood sugar levels. o The goal for blood sugar control after surgery is 80-180 mg/dL.   WHAT DO I DO ABOUT MY DIABETES MEDICATION?  Marland Kitchen Do not take oral diabetes medicines (pills) the morning of surgery.  Marland Kitchen THE  NIGHT BEFORE SURGERY, take 50% of Toujeo insulin.   Do not take dinner dose of glimepiride( amaryl )       . THE MORNING OF SURGERY, take  0 units of  Toujeo       insulin.  . The day of surgery, do not take other diabetes injectables, including Byetta (exenatide), Bydureon (exenatide ER), Victoza (liraglutide), or  Trulicity (dulaglutide).  . If your CBG is greater than 220 mg/dL, you may take  of your sliding scale  . (correction) dose of insulin.    Patient Signature:  Date:   Nurse Signature:  Date:   Reviewed and Endorsed by [facility] Patient Education Committee, August 2015

## 2017-07-25 ENCOUNTER — Encounter (HOSPITAL_COMMUNITY)
Admission: RE | Admit: 2017-07-25 | Discharge: 2017-07-25 | Disposition: A | Discharge status: Home / Self Care | Payer: Medicare Other | Source: Ambulatory Visit | Attending: General Surgery | Admitting: General Surgery

## 2017-07-25 ENCOUNTER — Encounter (HOSPITAL_COMMUNITY): Payer: Self-pay

## 2017-07-25 ENCOUNTER — Other Ambulatory Visit: Payer: Self-pay

## 2017-07-25 DIAGNOSIS — Z01812 Encounter for preprocedural laboratory examination: Secondary | ICD-10-CM | POA: Diagnosis not present

## 2017-07-25 DIAGNOSIS — K432 Incisional hernia without obstruction or gangrene: Secondary | ICD-10-CM | POA: Diagnosis not present

## 2017-07-25 HISTORY — DX: Personal history of urinary calculi: Z87.442

## 2017-07-25 LAB — CBC WITH DIFFERENTIAL/PLATELET
BASOS PCT: 1 %
Basophils Absolute: 0.1 10*3/uL (ref 0.0–0.1)
Eosinophils Absolute: 0.3 10*3/uL (ref 0.0–0.7)
Eosinophils Relative: 3 %
HEMATOCRIT: 39.3 % (ref 39.0–52.0)
Hemoglobin: 12.3 g/dL — ABNORMAL LOW (ref 13.0–17.0)
LYMPHS ABS: 3.1 10*3/uL (ref 0.7–4.0)
LYMPHS PCT: 33 %
MCH: 27.9 pg (ref 26.0–34.0)
MCHC: 31.3 g/dL (ref 30.0–36.0)
MCV: 89.1 fL (ref 78.0–100.0)
MONO ABS: 0.7 10*3/uL (ref 0.1–1.0)
MONOS PCT: 7 %
NEUTROS ABS: 5.2 10*3/uL (ref 1.7–7.7)
Neutrophils Relative %: 56 %
Platelets: 203 10*3/uL (ref 150–400)
RBC: 4.41 MIL/uL (ref 4.22–5.81)
RDW: 13.9 % (ref 11.5–15.5)
WBC: 9.2 10*3/uL (ref 4.0–10.5)

## 2017-07-25 LAB — COMPREHENSIVE METABOLIC PANEL
ALT: 13 U/L — ABNORMAL LOW (ref 17–63)
ANION GAP: 12 (ref 5–15)
AST: 17 U/L (ref 15–41)
Albumin: 3.7 g/dL (ref 3.5–5.0)
Alkaline Phosphatase: 60 U/L (ref 38–126)
BILIRUBIN TOTAL: 0.8 mg/dL (ref 0.3–1.2)
BUN: 39 mg/dL — ABNORMAL HIGH (ref 6–20)
CO2: 21 mmol/L — ABNORMAL LOW (ref 22–32)
Calcium: 9.7 mg/dL (ref 8.9–10.3)
Chloride: 111 mmol/L (ref 101–111)
Creatinine, Ser: 1.65 mg/dL — ABNORMAL HIGH (ref 0.61–1.24)
GFR calc non Af Amer: 37 mL/min — ABNORMAL LOW (ref >60)
GFR, EST AFRICAN AMERICAN: 43 mL/min — AB (ref >60)
Glucose, Bld: 198 mg/dL — ABNORMAL HIGH (ref 65–99)
POTASSIUM: 5 mmol/L (ref 3.5–5.1)
Sodium: 144 mmol/L (ref 135–145)
TOTAL PROTEIN: 7.4 g/dL (ref 6.5–8.1)

## 2017-07-25 LAB — FRUCTOSAMINE: FRUCTOSAMINE: 281 umol/L (ref 0–285)

## 2017-07-25 LAB — GLUCOSE, CAPILLARY: GLUCOSE-CAPILLARY: 223 mg/dL — AB (ref 65–99)

## 2017-07-25 NOTE — Progress Notes (Signed)
07-25-17 CBC routed to Dr. Dalbert Batman for review.

## 2017-07-27 NOTE — H&P (Signed)
[place] Location: [facility] Patient #: [id number] DOB: 1932/10/23 Married / Language: English / Race: White Male       History of Present Illness       The patient is a 84 year old male who presents with an incisional hernia. This is an 84 year old man who returns to reschedule his open incisional hernia repair. It had to be canceled because of high blood sugars. He was originally referred to me by Dr. Barry Dienes for an incisional hernia. Merri Ray is his PCP. Ruffin Frederick is his endocrinologist.     In March 2018 he presented with a lower GI bleed from right sided diverticulosis. Originally this was coiled and stopped but then he re-bled and Dr. Barry Dienes performed open right colectomy on June 06, 2016 and found a large cecal diverticulum. The bleeding resolved and he recovered. I saw him in November 2018 and he had noted a bulge to the left and above his umbilicus for 3 or 4 weeks. No pain or incarceration. Dr. Barry Dienes asked if I would assume his care.      Comorbidities include insulin-dependent diabetes, BMI 31, hospitalization last year for bleeding cecal diverticulum. Barrett's esophagus. Hiatal hernia. Hyperlipidemia. He doesn't have coronary disease, pulmonary disease or renal disease that we know of He is married with 1 son. Retired Education officer, [employer]. Denies tobacco. Takes alcohol rarely      I reviewed his CT scan once again. He has a smaller umbilical hernia and a larger incisional hernia in the midline to the right and just above the umbilicus. He appears to be a good candidate for TA R type surgical repair      He has seen Dr. Dwyane Dee and his blood sugars are now under good control      He will be scheduled for open repair of ventral incisional hernia with mesh, TAR type procedure in the near future. He requests [facility].   Allergies  No Known Drug Allergies :  Medication History  Accu-Chek SmartView (In Vitro) Active. AmLODIPine  Besylate (5MG  Tablet, Oral) Active. BD Pen Needle Mini U/F (31G X 5 MM Misc,) Active. Esomeprazole Magnesium (40MG  Capsule DR, Oral) Active. Glimepiride (2MG  Tablet, Oral) Active. Levothyroxine Sodium (125MCG Tablet, Oral) Active. MetFORMIN HCl ER (750MG  Tablet ER 24HR, Oral) Active. Simvastatin (40MG  Tablet, Oral) Active. Tamsulosin HCl (0.4MG  Capsule, Oral) Active. Toujeo SoloStar (300UNIT/ML Soln Pen-inj, Subcutaneous) Active. Victoza (18MG /3ML Soln Pen-inj, Subcutaneous) Active. Aspirin (81MG  Tablet DR, Oral) Active. Medications Reconciled  Vitals  Weight: 180.6 lb Height: 64in Body Surface Area: 1.87 m Body Mass Index: 31 kg/m  Temp.: 97.23F(Oral)  Pulse: 74 (Regular)  BP: 130/76 (Sitting, Left Arm, Standard)    Physical Exam General Mental Status-Alert. General Appearance-Not in acute distress. Build & Nutrition-Well nourished. Posture-Normal posture. Gait-Normal.  Head and Neck Head-normocephalic, atraumatic with no lesions or palpable masses. Trachea-midline. Thyroid Gland Characteristics - normal size and consistency and no palpable nodules.  Chest and Lung Exam Chest and lung exam reveals -on auscultation, normal breath sounds, no adventitious sounds and normal vocal resonance.  Cardiovascular Cardiovascular examination reveals -normal heart sounds, regular rate and rhythm with no murmurs and femoral artery auscultation bilaterally reveals normal pulses, no bruits, no thrills.  Abdomen Inspection Inspection of the abdomen reveals - No Hernias. Palpation/Percussion Palpation and Percussion of the abdomen reveal - Soft, Non Tender, No Rigidity (guarding), No hepatosplenomegaly and No Palpable abdominal masses. Note: There is an upper midline incision from just below the xiphoid down to  the umbilicus. There is a hernia weakness at the base of the umbilicus and there is a second, baseball-sized hernia protruding to the  left of the lower end of the incision just above the umbilicus. This is reducible when supine. No abdominal mass. I do not detect an inguinal hernia although they are mentioned on CT scan.   Neurologic Neurologic evaluation reveals -alert and oriented x 3 with no impairment of recent or remote memory, normal attention span and ability to concentrate, normal sensation and normal coordination.  Musculoskeletal Normal Exam - Bilateral-Upper Extremity Strength Normal and Lower Extremity Strength Normal.       Assessment & Plan  VENTRAL INCISIONAL HERNIA (K43.2)    Your blood sugars are under much better control now and that is excellent news Your examination has not changed. you still have one large and one small hernia in the middle of your abdomen  We are going to go ahead and reschedule the surgery at [facility] long [facility], open repair of incisional hernia with mesh as previously scheduled  Please read over the patient information booklet that I gave you   Do not drink the sugary drink the morning of surgery  TYPE 2 DIABETES MELLITUS WITH INSULIN THERAPY (E11.9) DIVERTICULITIS OF COLON WITH BLEEDING (K57.33) BARRETT'S ESOPHAGUS (K22.70) HYPERTENSION, ESSENTIAL (I10) HYPERLIPIDEMIA, MILD (E78.5)    Amoura Ransier M. Dalbert Batman, M.D., [facility] General and Minimally invasive Surgery Breast and Colorectal Surgery Office:   [phone] Pager:   [phone]

## 2017-07-30 ENCOUNTER — Ambulatory Visit: Payer: Medicare Other | Admitting: Endocrinology

## 2017-07-30 ENCOUNTER — Encounter: Payer: Self-pay | Admitting: Endocrinology

## 2017-07-30 VITALS — BP 150/70 | HR 62 | Ht 64.0 in | Wt 182.8 lb

## 2017-07-30 DIAGNOSIS — N183 Chronic kidney disease, stage 3 unspecified: Secondary | ICD-10-CM

## 2017-07-30 DIAGNOSIS — E063 Autoimmune thyroiditis: Secondary | ICD-10-CM

## 2017-07-30 DIAGNOSIS — Z794 Long term (current) use of insulin: Secondary | ICD-10-CM

## 2017-07-30 DIAGNOSIS — E1165 Type 2 diabetes mellitus with hyperglycemia: Secondary | ICD-10-CM | POA: Diagnosis not present

## 2017-07-30 MED ORDER — LEVOTHYROXINE SODIUM 137 MCG PO TABS: 137.0000 ug | ORAL_TABLET | Freq: Every day | ORAL | 3 refills | Status: DC

## 2017-07-30 NOTE — Patient Instructions (Addendum)
Toujeo 20 units daily  MUST CHECK AT 3-4 PM after eating or AT LEAST when taking insulin  New thyroid Rx sent

## 2017-07-30 NOTE — Progress Notes (Signed)
Patient ID: Zachary Cook, male   DOB: 1932-08-19, 84 y.o.   MRN: [id number]           Reason for Appointment: Follow-Up    History of Present Illness:          Diagnosis: Type 2 diabetes mellitus, date of diagnosis: 2005       Past history: He was started on metformin when he was found to have diabetes on routine lab work. Details of this are not available He thinks his blood sugars had been fairly well controlled for several years with metformin alone but no records are available He has had a couple of different physicians and has not always been followed consistently for his diabetes  Blood sugars had been significantly higher in the 4 months prior to his consultation in 10/15 and his A1c had gone up to 12% with his regimen of glipizide and metformin  He was given a trial of Invokana but this did not improve his blood sugars He has been on Victoza since 10/15 and this was restarted in 1/16, initially had difficulty with nausea  Recent history:    INSULIN regimen: Toujeo 18 units daily around 6 PM  Non-insulin hypoglycemic drugs : Metformin ER 500 mg, 2 tablets daily, Amaryl 2 mg at supper, Victoza 1.2 mg daily  His A1c is  7.1 but lower than expected for his home blood sugars  Current blood sugar patterns and problems identified:  Recently appears to have significant fluctuation in his blood sugars in the mornings and not clear why  He is consistently not remembering to check his blood sugars after meals especially his main meal which is in the early afternoon  FASTING blood sugars although averaging about 150 ER mostly higher recently in the last week or so  He has only good readings in the evenings around 6-8 PM and 1 of them is high even though he does not think he is eating much in the afternoon and evenings  Last night after eating a relatively higher fat meal he has a glucose of 384 this morning  Also has a low readings above 150 in the morning  He thinks he is  regular with his insulin and Victoza although taking Victoza in the morning and insulin in the  Recently trying to cut back on high fat snacks like ice cream at night   Not able to lose weight        Glucose monitoring:  done 1 times a day         Glucometer: Accu-Chek       Blood Glucose readings  Recently   FASTING average 151 with blood sugar range 76-384 over the last month , Minimal nonfasting monitoring   Self-care: The diet that the patient has been following is: None, usually low fat      Meals:  Mostly 1 at 2 pm, sometimes having fruit at night         Exercise: walking a little recently  Dietician visit, most recent: None.               Weight history: Previous range  175-205  Wt Readings from Last 3 Encounters:  07/30/17 182 lb 12.8 oz (82.9 kg)  07/25/17 175 lb (79.4 kg)  06/16/17 181 lb (82.1 kg)    Glycemic control:   Lab Results  Component Value Date   HGBA1C 7.1 (H) 06/13/2017   HGBA1C 6.8 (H) 05/28/2017   HGBA1C 6.5 10/14/2016   Lab  Results  Component Value Date   MICROALBUR 54.1 (H) 07/24/2017   LDLCALC 89 05/12/2017   CREATININE 1.65 (H) 07/25/2017    Lab Results  Component Value Date   FRUCTOSAMINE 281 07/24/2017   FRUCTOSAMINE 322 (H) 05/05/2017   FRUCTOSAMINE 323 (H) 11/28/2016    OTHER active problems: See review of systems   Hospital Outpatient Visit on 07/25/2017  Component Date Value Ref Range Status  . WBC 07/25/2017 9.2  4.0 - 10.5 K/uL Final  . RBC 07/25/2017 4.41  4.22 - 5.81 MIL/uL Final  . Hemoglobin 07/25/2017 12.3* 13.0 - 17.0 g/dL Final  . HCT 07/25/2017 39.3  39.0 - 52.0 % Final  . MCV 07/25/2017 89.1  78.0 - 100.0 fL Final  . MCH 07/25/2017 27.9  26.0 - 34.0 pg Final  . MCHC 07/25/2017 31.3  30.0 - 36.0 g/dL Final  . RDW 07/25/2017 13.9  11.5 - 15.5 % Final  . Platelets 07/25/2017 203  150 - 400 K/uL Final  . Neutrophils Relative % 07/25/2017 56  % Final  . Neutro Abs 07/25/2017 5.2  1.7 - 7.7 K/uL Final  .  Lymphocytes Relative 07/25/2017 33  % Final  . Lymphs Abs 07/25/2017 3.1  0.7 - 4.0 K/uL Final  . Monocytes Relative 07/25/2017 7  % Final  . Monocytes Absolute 07/25/2017 0.7  0.1 - 1.0 K/uL Final  . Eosinophils Relative 07/25/2017 3  % Final  . Eosinophils Absolute 07/25/2017 0.3  0.0 - 0.7 K/uL Final  . Basophils Relative 07/25/2017 1  % Final  . Basophils Absolute 07/25/2017 0.1  0.0 - 0.1 K/uL Final   Performed at [facility], [place] [address] [place], [place] [address]  . Sodium 07/25/2017 144  135 - 145 mmol/L Final  . Potassium 07/25/2017 5.0  3.5 - 5.1 mmol/L Final  . Chloride 07/25/2017 111  101 - 111 mmol/L Final  . CO2 07/25/2017 21* 22 - 32 mmol/L Final  . Glucose, Bld 07/25/2017 198* 65 - 99 mg/dL Final  . BUN 07/25/2017 39* 6 - 20 mg/dL Final  . Creatinine, Ser 07/25/2017 1.65* 0.61 - 1.24 mg/dL Final  . Calcium 07/25/2017 9.7  8.9 - 10.3 mg/dL Final  . Total Protein 07/25/2017 7.4  6.5 - 8.1 g/dL Final  . Albumin 07/25/2017 3.7  3.5 - 5.0 g/dL Final  . AST 07/25/2017 17  15 - 41 U/L Final  . ALT 07/25/2017 13* 17 - 63 U/L Final  . Alkaline Phosphatase 07/25/2017 60  38 - 126 U/L Final  . Total Bilirubin 07/25/2017 0.8  0.3 - 1.2 mg/dL Final  . GFR calc non Af Amer 07/25/2017 37* >60 mL/min Final  . GFR calc Af Amer 07/25/2017 43* >60 mL/min Final   Comment: (NOTE) The eGFR has been calculated using the CKD EPI equation. This calculation has not been validated in all clinical situations. eGFR's persistently <60 mL/min signify possible Chronic Kidney Disease.   Georgiann Hahn gap 07/25/2017 12  5 - 15 Final   Performed at [facility], [place] [address], [place], [place] [address]  . Glucose-Capillary 07/25/2017 223* 65 - 99 mg/dL Final  Lab on 07/24/2017  Component Date Value Ref Range Status  . Microalb, Ur 07/24/2017 54.1* 0.0 - 1.9 mg/dL Final  . Creatinine,U 07/24/2017 184.4  mg/dL Final  . Microalb Creat Ratio 07/24/2017 29.3  0.0  - 30.0 mg/g Final  . Color, Urine 07/24/2017 YELLOW  Yellow;Lt. Yellow Final  . APPearance 07/24/2017 CLEAR  Clear  Final  . Specific Gravity, Urine 07/24/2017 1.025  1.000 - 1.030 Final  . pH 07/24/2017 5.5  5.0 - 8.0 Final  . Total Protein, Urine 07/24/2017 100* Negative Final  . Urine Glucose 07/24/2017 NEGATIVE  Negative Final  . Ketones, ur 07/24/2017 TRACE* Negative Final  . Bilirubin Urine 07/24/2017 NEGATIVE  Negative Final  . Hgb urine dipstick 07/24/2017 SMALL* Negative Final  . Urobilinogen, UA 07/24/2017 0.2  0.0 - 1.0 Final  . Leukocytes, UA 07/24/2017 NEGATIVE  Negative Final  . Nitrite 07/24/2017 NEGATIVE  Negative Final  . WBC, UA 07/24/2017 0-2/hpf  0-2/hpf Final  . RBC / HPF 07/24/2017 0-2/hpf  0-2/hpf Final  . Squamous Epithelial / LPF 07/24/2017 Rare(0-4/hpf)  Rare(0-4/hpf) Final  . TSH 07/24/2017 0.37  0.35 - 4.50 uIU/mL Final  . Sodium 07/24/2017 141  135 - 145 mEq/L Final  . Potassium 07/24/2017 5.4* 3.5 - 5.1 mEq/L Final  . Chloride 07/24/2017 108  96 - 112 mEq/L Final  . CO2 07/24/2017 26  19 - 32 mEq/L Final  . Glucose, Bld 07/24/2017 170* 70 - 99 mg/dL Final  . BUN 07/24/2017 33* 6 - 23 mg/dL Final  . Creatinine, Ser 07/24/2017 1.46  0.40 - 1.50 mg/dL Final  . Calcium 07/24/2017 9.7  8.4 - 10.5 mg/dL Final  . GFR 07/24/2017 49.01* >60.00 mL/min Final  . Fructosamine 07/24/2017 281  0 - 285 umol/L Final   Comment: Published reference interval for apparently healthy subjects between age 58 and 62 is 60 - 285 umol/L and in a poorly controlled diabetic population is 228 - 563 umol/L with a mean of 396 umol/L.       Allergies as of 07/30/2017   No Known Allergies     Medication List        Accurate as of 07/30/17 10:03 AM. Always use your most recent med list.          ACCU-CHEK NANO SMARTVIEW w/Device Kit   ACCU-CHEK SMARTVIEW test strip Generic drug:  glucose blood USE TO TEST TWICE DAILY   amLODipine 5 MG tablet Commonly known as:   NORVASC TAKE 1 TABLET(5 MG) BY MOUTH DAILY   aspirin EC 81 MG tablet Take 81 mg by mouth daily.   atorvastatin 10 MG tablet Commonly known as:  LIPITOR TAKE 1 TABLET(10 MG) BY MOUTH DAILY AT 6 PM   B-D UF III MINI PEN NEEDLES 31G X 5 MM Misc Generic drug:  Insulin Pen Needle USE ONE PER DAY WITH VICTOZA   esomeprazole 40 MG capsule Commonly known as:  NEXIUM TAKE ONE CAPSULE BY MOUTH DAILY AT NOON   Fish Oil 1200 MG Caps Take 1,200 mg by mouth daily.   glimepiride 2 MG tablet Commonly known as:  AMARYL TAKE 2 TABLETS(4 MG) BY MOUTH DAILY BEFORE DINNER   Insulin Glargine 300 UNIT/ML Sopn Commonly known as:  TOUJEO SOLOSTAR Inject 16 Units into the skin daily.   levothyroxine 137 MCG tablet Commonly known as:  SYNTHROID, LEVOTHROID Take 1 tablet (137 mcg total) by mouth daily.   metFORMIN 500 MG 24 hr tablet Commonly known as:  GLUCOPHAGE-XR Take 2 tablets (1,000 mg total) by mouth daily with supper.   MULTIVITAMIN ADULT Tabs Take 1 tablet by mouth every other day.   naproxen sodium 220 MG tablet Commonly known as:  ALEVE Take 440 mg by mouth daily as needed (pain).   tamsulosin 0.4 MG Caps capsule Commonly known as:  FLOMAX TAKE ONE CAPSULE BY MOUTH DAILY  VICTOZA 18 MG/3ML Sopn Generic drug:  liraglutide ADMINISTER 1.2 MG UNDER THE SKIN DAILY       Allergies: No Known Allergies  Past Medical History:  Diagnosis Date  . Barrett's esophagus   . Bilateral inguinal hernia   . Cardiomegaly 02/21/2017   noted on CXR  . Cholelithiasis   . Chronic kidney disease    stage 3 Goes to [place] [facility]  . Diabetes mellitus without complication ([place])    type 2  . Diverticulosis   . Duodenitis   . GERD (gastroesophageal reflux disease)   . Hiatal hernia   . History of blood transfusion 05/2016  . History of GI bleed   . History of hyperkalemia 02/2017  . History of kidney stones    35 years ago  . Hyperlipemia   . Hypertension   . Hypogonadism in male    . Hypothyroidism   . Ileus following gastrointestinal surgery ([place]) 06/12/2016  . Skin cancer    right arm treated by dermatologist  . Syncope 06/03/2016  . Umbilical hernia    Small  . Ventral hernia    Anterior    Past Surgical History:  Procedure Laterality Date  . COLONOSCOPY    . ESOPHAGOGASTRODUODENOSCOPY    . IR GENERIC HISTORICAL  05/28/2016   IR ANGIOGRAM SELECTIVE EACH ADDITIONAL VESSEL 05/28/2016 Aletta Edouard, MD [facility]  . IR GENERIC HISTORICAL  05/28/2016   IR ANGIOGRAM VISCERAL SELECTIVE 05/28/2016 Aletta Edouard, MD [facility]  . IR GENERIC HISTORICAL  05/28/2016   IR US GUIDE VASC ACCESS RIGHT 05/28/2016 Aletta Edouard, MD [facility]  . IR GENERIC HISTORICAL  05/28/2016   IR ANGIOGRAM VISCERAL SELECTIVE 05/28/2016 Arne Cleveland, MD [facility]  . IR GENERIC HISTORICAL  05/28/2016   IR ANGIOGRAM SELECTIVE EACH ADDITIONAL VESSEL 05/28/2016 Arne Cleveland, MD [facility]  . IR GENERIC HISTORICAL  05/28/2016   IR EMBO ART  VEN HEMORR LYMPH EXTRAV  INC GUIDE ROADMAPPING 05/28/2016 Arne Cleveland, MD [facility]  . IR GENERIC HISTORICAL  05/28/2016   IR US GUIDE VASC ACCESS RIGHT 05/28/2016 Arne Cleveland, MD [facility]  . PARTIAL COLECTOMY N/A 06/06/2016   Procedure: OPEN ASCENDING COLECTOMY;  Surgeon: Stark Klein, MD;  Location: [facility];  Service: General;  Laterality: N/A;  . VASECTOMY      Family History  Problem Relation Age of Onset  . Heart disease Mother   . Diabetes Mother   . Cancer Father   . Heart attack Son     Social History:  reports that he has never smoked. He has never used smokeless tobacco. He reports that he does not drink alcohol or use drugs.    Review of Systems   The following information was adapted from previous notes and updated and modified for up-to-date information and history   HYPERTENSION: Followed by PCP Blood pressure tends to be high   BP Readings from Last 3 Encounters:  07/30/17 (!) 150/70  07/25/17 (!)  142/103  06/16/17 (!) 148/86   RENAL dysfunction appears to be somewhat worse without any change in medications  He has seen a nephrologist and no recommendations make  Lab Results  Component Value Date   CREATININE 1.65 (H) 07/25/2017   CREATININE 1.46 07/24/2017   CREATININE 1.44 06/13/2017     Lab Results  Component Value Date   CREATININE 1.65 (H) 07/25/2017   BUN 39 (H) 07/25/2017   NA 144 07/25/2017   K 5.0 07/25/2017   CL 111 07/25/2017  CO2 21 (L) 07/25/2017          Lipids: Most recent labs as below, taking Lipitor Recent labs:       Lab Results  Component Value Date   CHOL 154 05/12/2017   HDL 40 05/12/2017   LDLCALC 89 05/12/2017   LDLDIRECT 125.0 12/14/2014   TRIG 127 05/12/2017   CHOLHDL 3.9 05/12/2017                  Thyroid:  He   was found to have hypothyroidism about the year 2000 on routine labs without symptoms. Apparently he did not  feel any different with taking the thyroid supplement.   His TSH had been previously high but now trending consistently lower and now down to 0.37 Is not changing the way he takes his medication No fatigue or even shakiness or palpitations    Labs as follows:  Lab Results  Component Value Date   TSH 0.37 07/24/2017   TSH 3.06 06/13/2017   TSH 20.13 (H) 05/05/2017   FREET4 0.85 06/13/2017   FREET4 0.75 05/05/2017   FREET4 0.65 11/28/2016       He has been told to have hypogonadism of unclear etiology in the past He has been  reluctant to go back on AndroGel as he does not think he has enough symptoms   Lab Results  Component Value Date   TESTOSTERONE 174.72 (L) 12/14/2014     LABS:  Hospital Outpatient Visit on 07/25/2017  Component Date Value Ref Range Status  . WBC 07/25/2017 9.2  4.0 - 10.5 K/uL Final  . RBC 07/25/2017 4.41  4.22 - 5.81 MIL/uL Final  . Hemoglobin 07/25/2017 12.3* 13.0 - 17.0 g/dL Final  . HCT 07/25/2017 39.3  39.0 - 52.0 % Final  . MCV 07/25/2017 89.1  78.0 - 100.0 fL  Final  . MCH 07/25/2017 27.9  26.0 - 34.0 pg Final  . MCHC 07/25/2017 31.3  30.0 - 36.0 g/dL Final  . RDW 07/25/2017 13.9  11.5 - 15.5 % Final  . Platelets 07/25/2017 203  150 - 400 K/uL Final  . Neutrophils Relative % 07/25/2017 56  % Final  . Neutro Abs 07/25/2017 5.2  1.7 - 7.7 K/uL Final  . Lymphocytes Relative 07/25/2017 33  % Final  . Lymphs Abs 07/25/2017 3.1  0.7 - 4.0 K/uL Final  . Monocytes Relative 07/25/2017 7  % Final  . Monocytes Absolute 07/25/2017 0.7  0.1 - 1.0 K/uL Final  . Eosinophils Relative 07/25/2017 3  % Final  . Eosinophils Absolute 07/25/2017 0.3  0.0 - 0.7 K/uL Final  . Basophils Relative 07/25/2017 1  % Final  . Basophils Absolute 07/25/2017 0.1  0.0 - 0.1 K/uL Final   Performed at [facility], [place] [address], [place], [place] [address]  . Sodium 07/25/2017 144  135 - 145 mmol/L Final  . Potassium 07/25/2017 5.0  3.5 - 5.1 mmol/L Final  . Chloride 07/25/2017 111  101 - 111 mmol/L Final  . CO2 07/25/2017 21* 22 - 32 mmol/L Final  . Glucose, Bld 07/25/2017 198* 65 - 99 mg/dL Final  . BUN 07/25/2017 39* 6 - 20 mg/dL Final  . Creatinine, Ser 07/25/2017 1.65* 0.61 - 1.24 mg/dL Final  . Calcium 07/25/2017 9.7  8.9 - 10.3 mg/dL Final  . Total Protein 07/25/2017 7.4  6.5 - 8.1 g/dL Final  . Albumin 07/25/2017 3.7  3.5 - 5.0 g/dL Final  . AST 07/25/2017 17  15 - 41  U/L Final  . ALT 07/25/2017 13* 17 - 63 U/L Final  . Alkaline Phosphatase 07/25/2017 60  38 - 126 U/L Final  . Total Bilirubin 07/25/2017 0.8  0.3 - 1.2 mg/dL Final  . GFR calc non Af Amer 07/25/2017 37* >60 mL/min Final  . GFR calc Af Amer 07/25/2017 43* >60 mL/min Final   Comment: (NOTE) The eGFR has been calculated using the CKD EPI equation. This calculation has not been validated in all clinical situations. eGFR's persistently <60 mL/min signify possible Chronic Kidney Disease.   Georgiann Hahn gap 07/25/2017 12  5 - 15 Final   Performed at [facility], [place]  [address], [place], [place] [address]  . Glucose-Capillary 07/25/2017 223* 65 - 99 mg/dL Final  Lab on 07/24/2017  Component Date Value Ref Range Status  . Microalb, Ur 07/24/2017 54.1* 0.0 - 1.9 mg/dL Final  . Creatinine,U 07/24/2017 184.4  mg/dL Final  . Microalb Creat Ratio 07/24/2017 29.3  0.0 - 30.0 mg/g Final  . Color, Urine 07/24/2017 YELLOW  Yellow;Lt. Yellow Final  . APPearance 07/24/2017 CLEAR  Clear Final  . Specific Gravity, Urine 07/24/2017 1.025  1.000 - 1.030 Final  . pH 07/24/2017 5.5  5.0 - 8.0 Final  . Total Protein, Urine 07/24/2017 100* Negative Final  . Urine Glucose 07/24/2017 NEGATIVE  Negative Final  . Ketones, ur 07/24/2017 TRACE* Negative Final  . Bilirubin Urine 07/24/2017 NEGATIVE  Negative Final  . Hgb urine dipstick 07/24/2017 SMALL* Negative Final  . Urobilinogen, UA 07/24/2017 0.2  0.0 - 1.0 Final  . Leukocytes, UA 07/24/2017 NEGATIVE  Negative Final  . Nitrite 07/24/2017 NEGATIVE  Negative Final  . WBC, UA 07/24/2017 0-2/hpf  0-2/hpf Final  . RBC / HPF 07/24/2017 0-2/hpf  0-2/hpf Final  . Squamous Epithelial / LPF 07/24/2017 Rare(0-4/hpf)  Rare(0-4/hpf) Final  . TSH 07/24/2017 0.37  0.35 - 4.50 uIU/mL Final  . Sodium 07/24/2017 141  135 - 145 mEq/L Final  . Potassium 07/24/2017 5.4* 3.5 - 5.1 mEq/L Final  . Chloride 07/24/2017 108  96 - 112 mEq/L Final  . CO2 07/24/2017 26  19 - 32 mEq/L Final  . Glucose, Bld 07/24/2017 170* 70 - 99 mg/dL Final  . BUN 07/24/2017 33* 6 - 23 mg/dL Final  . Creatinine, Ser 07/24/2017 1.46  0.40 - 1.50 mg/dL Final  . Calcium 07/24/2017 9.7  8.4 - 10.5 mg/dL Final  . GFR 07/24/2017 49.01* >60.00 mL/min Final  . Fructosamine 07/24/2017 281  0 - 285 umol/L Final   Comment: Published reference interval for apparently healthy subjects between age 42 and 78 is 2 - 285 umol/L and in a poorly controlled diabetic population is 228 - 563 umol/L with a mean of 396 umol/L.     Physical Examination:  BP (!) 150/70 (BP  Location: Left Arm, Patient Position: Sitting, Cuff Size: Normal)   Pulse 62   Ht '5\' 4"'$  (1.626 m)   Wt 182 lb 12.8 oz (82.9 kg)   SpO2 92%   BMI 31.38 kg/m    ASSESSMENT:  Diabetes type 2, insulin requiring  See history of present illness for detailed discussion of current diabetes management, blood sugar patterns and problems identified Last A1c was 7.1 Now fructosamine is 281 indicating overall somewhat better control than about 2 months ago  Blood sugar may be reasonably controlled for his age and general medical problems Currently on basal insulin, Victoza, metformin and Amaryl   difficult to assess his recent control as he is  only checking readings in the mornings and not clear if he is having Postprandial hyperglycemia   Hypothyroidism: TSH is low normal with 150 mcg and he will cut back on the medication to 137 mcg TSH should be around 3-4 for his age   Hypertension: This is to be followed up by his PCP   renal dysfunction: He needs to follow-up with his PCP or nephrologist regarding this    PLAN:   Increase Tresiba to 20 units to make sure his fasting in the more consistently controlled Discussed that morning sugar should be about 100-140 more consistently  Avoid high fat meals and snacks in the evening He will need to set a reminder to check his blood sugars in the late afternoon after meal, discussed that readings over 200 are probably indicating lack of insulin at mealtimes May consider mealtime insulin if consistently high after lunch Consistent diet with avoiding high fat snacks and fried food  No change in metformin as long as renal function stays stable Other medications And problems: See above   he may proceed with his hernia surgery   Counseling time on subjects discussed in assessment and plan sections is over 50% of today's 25 minute visit  Patient Instructions  Toujeo 20 units daily  MUST CHECK AT 3-4 PM after eating or AT LEAST when taking  insulin  New thyroid Rx sent      Elayne Snare 07/30/2017, 10:03 AM   Note: This office note was prepared with Dragon voice recognition system technology. Any transcriptional errors that result from this process are unintentional.

## 2017-08-01 ENCOUNTER — Inpatient Hospital Stay (HOSPITAL_COMMUNITY)
Admission: RE | Admit: 2017-08-01 | Discharge: 2017-08-03 | DRG: 353 | Disposition: A | Discharge status: Home / Self Care | Payer: Medicare Other | Source: Ambulatory Visit | Attending: General Surgery | Admitting: General Surgery

## 2017-08-01 ENCOUNTER — Other Ambulatory Visit: Payer: Self-pay

## 2017-08-01 ENCOUNTER — Encounter (HOSPITAL_COMMUNITY)
Admission: RE | Disposition: A | Discharge status: Home / Self Care | Payer: Self-pay | Source: Ambulatory Visit | Attending: General Surgery

## 2017-08-01 ENCOUNTER — Inpatient Hospital Stay (HOSPITAL_COMMUNITY): Payer: Medicare Other | Admitting: Certified Registered Nurse Anesthetist

## 2017-08-01 ENCOUNTER — Encounter (HOSPITAL_COMMUNITY): Payer: Self-pay

## 2017-08-01 DIAGNOSIS — K432 Incisional hernia without obstruction or gangrene: Secondary | ICD-10-CM | POA: Diagnosis present

## 2017-08-01 DIAGNOSIS — K449 Diaphragmatic hernia without obstruction or gangrene: Secondary | ICD-10-CM | POA: Diagnosis present

## 2017-08-01 DIAGNOSIS — Z7982 Long term (current) use of aspirin: Secondary | ICD-10-CM | POA: Diagnosis not present

## 2017-08-01 DIAGNOSIS — Z9889 Other specified postprocedural states: Secondary | ICD-10-CM

## 2017-08-01 DIAGNOSIS — E1122 Type 2 diabetes mellitus with diabetic chronic kidney disease: Secondary | ICD-10-CM | POA: Diagnosis present

## 2017-08-01 DIAGNOSIS — E785 Hyperlipidemia, unspecified: Secondary | ICD-10-CM | POA: Diagnosis present

## 2017-08-01 DIAGNOSIS — I129 Hypertensive chronic kidney disease with stage 1 through stage 4 chronic kidney disease, or unspecified chronic kidney disease: Secondary | ICD-10-CM | POA: Diagnosis present

## 2017-08-01 DIAGNOSIS — E669 Obesity, unspecified: Secondary | ICD-10-CM | POA: Diagnosis present

## 2017-08-01 DIAGNOSIS — K219 Gastro-esophageal reflux disease without esophagitis: Secondary | ICD-10-CM | POA: Diagnosis present

## 2017-08-01 DIAGNOSIS — Z9049 Acquired absence of other specified parts of digestive tract: Secondary | ICD-10-CM

## 2017-08-01 DIAGNOSIS — N182 Chronic kidney disease, stage 2 (mild): Secondary | ICD-10-CM | POA: Diagnosis present

## 2017-08-01 DIAGNOSIS — K5733 Diverticulitis of large intestine without perforation or abscess with bleeding: Secondary | ICD-10-CM | POA: Diagnosis present

## 2017-08-01 DIAGNOSIS — D631 Anemia in chronic kidney disease: Secondary | ICD-10-CM | POA: Diagnosis present

## 2017-08-01 DIAGNOSIS — Z794 Long term (current) use of insulin: Secondary | ICD-10-CM

## 2017-08-01 DIAGNOSIS — Z8719 Personal history of other diseases of the digestive system: Secondary | ICD-10-CM

## 2017-08-01 DIAGNOSIS — K2270 Barrett's esophagus without dysplasia: Secondary | ICD-10-CM | POA: Diagnosis present

## 2017-08-01 DIAGNOSIS — Z6831 Body mass index (BMI) 31.0-31.9, adult: Secondary | ICD-10-CM | POA: Diagnosis not present

## 2017-08-01 DIAGNOSIS — K429 Umbilical hernia without obstruction or gangrene: Secondary | ICD-10-CM | POA: Diagnosis present

## 2017-08-01 DIAGNOSIS — Z79899 Other long term (current) drug therapy: Secondary | ICD-10-CM | POA: Diagnosis not present

## 2017-08-01 HISTORY — PX: INSERTION OF MESH: SHX5868

## 2017-08-01 HISTORY — PX: INCISIONAL HERNIA REPAIR: SHX193

## 2017-08-01 LAB — GLUCOSE, CAPILLARY
GLUCOSE-CAPILLARY: 109 mg/dL — AB (ref 65–99)
GLUCOSE-CAPILLARY: 157 mg/dL — AB (ref 65–99)
GLUCOSE-CAPILLARY: 113 mg/dL — AB (ref 65–99)
GLUCOSE-CAPILLARY: 166 mg/dL — AB (ref 65–99)
Glucose-Capillary: 121 mg/dL — ABNORMAL HIGH (ref 65–99)

## 2017-08-01 SURGERY — REPAIR, HERNIA, INCISIONAL
Anesthesia: General | Site: Abdomen

## 2017-08-01 MED ORDER — CELECOXIB 200 MG PO CAPS
200.0000 mg | ORAL_CAPSULE | Freq: Two times a day (BID) | ORAL | Status: DC
  Administered 2017-08-01 – 2017-08-03 (×5): 200 mg via ORAL
  Filled 2017-08-01 (×5): qty 1

## 2017-08-01 MED ORDER — FENTANYL CITRATE (PF) 100 MCG/2ML IJ SOLN
INTRAMUSCULAR | Status: AC
  Filled 2017-08-01: qty 2

## 2017-08-01 MED ORDER — GABAPENTIN 300 MG PO CAPS
300.0000 mg | ORAL_CAPSULE | ORAL | Status: AC
  Administered 2017-08-01: 300 mg via ORAL
  Filled 2017-08-01: qty 1

## 2017-08-01 MED ORDER — OXYCODONE HCL 5 MG/5ML PO SOLN: 5.0000 mg | Freq: Once | ORAL | Status: DC | PRN

## 2017-08-01 MED ORDER — SODIUM CHLORIDE 0.9 % IR SOLN
Status: DC | PRN
  Administered 2017-08-01: 1000 mL

## 2017-08-01 MED ORDER — SUGAMMADEX SODIUM 200 MG/2ML IV SOLN
INTRAVENOUS | Status: AC
Start: 2017-08-01 — End: ?
  Filled 2017-08-01: qty 2

## 2017-08-01 MED ORDER — KETOROLAC TROMETHAMINE 15 MG/ML IJ SOLN
15.0000 mg | Freq: Four times a day (QID) | INTRAMUSCULAR | Status: DC | PRN
  Administered 2017-08-01: 15 mg via INTRAVENOUS
  Filled 2017-08-01: qty 1

## 2017-08-01 MED ORDER — LACTATED RINGERS IV SOLN
INTRAVENOUS | Status: DC | PRN
  Administered 2017-08-01 (×3): via INTRAVENOUS

## 2017-08-01 MED ORDER — INSULIN ASPART 100 UNIT/ML ~~LOC~~ SOLN
0.0000 [IU] | Freq: Three times a day (TID) | SUBCUTANEOUS | Status: DC
  Administered 2017-08-01 – 2017-08-02 (×2): 4 [IU] via SUBCUTANEOUS
  Administered 2017-08-02: 7 [IU] via SUBCUTANEOUS
  Administered 2017-08-03: 4 [IU] via SUBCUTANEOUS

## 2017-08-01 MED ORDER — MEPERIDINE HCL 50 MG/ML IJ SOLN: 6.2500 mg | INTRAMUSCULAR | Status: DC | PRN

## 2017-08-01 MED ORDER — BUPIVACAINE-EPINEPHRINE 0.5% -1:200000 IJ SOLN
INTRAMUSCULAR | Status: AC
Start: 2017-08-01 — End: ?
  Filled 2017-08-01: qty 1

## 2017-08-01 MED ORDER — PROPOFOL 10 MG/ML IV BOLUS
INTRAVENOUS | Status: AC
  Filled 2017-08-01: qty 20

## 2017-08-01 MED ORDER — TAMSULOSIN HCL 0.4 MG PO CAPS
0.4000 mg | ORAL_CAPSULE | Freq: Every day | ORAL | Status: DC
  Administered 2017-08-01 – 2017-08-03 (×3): 0.4 mg via ORAL
  Filled 2017-08-01 (×3): qty 1

## 2017-08-01 MED ORDER — OXYCODONE HCL 5 MG PO TABS: 5.0000 mg | ORAL_TABLET | Freq: Once | ORAL | Status: DC | PRN

## 2017-08-01 MED ORDER — LIDOCAINE 2% (20 MG/ML) 5 ML SYRINGE
INTRAMUSCULAR | Status: DC | PRN
  Administered 2017-08-01: 80 mg via INTRAVENOUS

## 2017-08-01 MED ORDER — ACETAMINOPHEN 500 MG PO TABS
1000.0000 mg | ORAL_TABLET | Freq: Four times a day (QID) | ORAL | Status: AC
  Administered 2017-08-01 – 2017-08-02 (×3): 1000 mg via ORAL
  Filled 2017-08-01 (×3): qty 2

## 2017-08-01 MED ORDER — PANTOPRAZOLE SODIUM 40 MG PO TBEC
40.0000 mg | DELAYED_RELEASE_TABLET | Freq: Every day | ORAL | Status: DC
  Administered 2017-08-01 – 2017-08-03 (×3): 40 mg via ORAL
  Filled 2017-08-01 (×3): qty 1

## 2017-08-01 MED ORDER — SODIUM CHLORIDE 0.9 % IV SOLN
INTRAVENOUS | Status: DC
  Administered 2017-08-01 – 2017-08-02 (×2): via INTRAVENOUS

## 2017-08-01 MED ORDER — ROCURONIUM BROMIDE 10 MG/ML (PF) SYRINGE
PREFILLED_SYRINGE | INTRAVENOUS | Status: AC
Start: 2017-08-01 — End: ?
  Filled 2017-08-01: qty 5

## 2017-08-01 MED ORDER — HYDROMORPHONE HCL 1 MG/ML IJ SOLN: 0.2500 mg | INTRAMUSCULAR | Status: DC | PRN

## 2017-08-01 MED ORDER — ONDANSETRON HCL 4 MG PO TABS: 4.0000 mg | ORAL_TABLET | Freq: Four times a day (QID) | ORAL | Status: DC | PRN

## 2017-08-01 MED ORDER — PHENYLEPHRINE 40 MCG/ML (10ML) SYRINGE FOR IV PUSH (FOR BLOOD PRESSURE SUPPORT)
PREFILLED_SYRINGE | INTRAVENOUS | Status: DC | PRN
  Administered 2017-08-01: 80 ug via INTRAVENOUS
  Administered 2017-08-01: 40 ug via INTRAVENOUS

## 2017-08-01 MED ORDER — LIDOCAINE 2% (20 MG/ML) 5 ML SYRINGE
INTRAMUSCULAR | Status: DC | PRN
  Administered 2017-08-01: 1 mg/kg/h via INTRAVENOUS

## 2017-08-01 MED ORDER — ACETAMINOPHEN 500 MG PO TABS
1000.0000 mg | ORAL_TABLET | ORAL | Status: AC
  Administered 2017-08-01: 1000 mg via ORAL
  Filled 2017-08-01: qty 2

## 2017-08-01 MED ORDER — ONDANSETRON HCL 4 MG/2ML IJ SOLN
4.0000 mg | Freq: Four times a day (QID) | INTRAMUSCULAR | Status: DC | PRN
  Administered 2017-08-01: 4 mg via INTRAVENOUS
  Filled 2017-08-01: qty 2

## 2017-08-01 MED ORDER — LIDOCAINE 2% (20 MG/ML) 5 ML SYRINGE
INTRAMUSCULAR | Status: AC
  Filled 2017-08-01: qty 5

## 2017-08-01 MED ORDER — CHLORHEXIDINE GLUCONATE CLOTH 2 % EX PADS: 6.0000 | MEDICATED_PAD | Freq: Once | CUTANEOUS | Status: DC

## 2017-08-01 MED ORDER — LEVOTHYROXINE SODIUM 25 MCG PO TABS
137.0000 ug | ORAL_TABLET | Freq: Every day | ORAL | Status: DC
  Administered 2017-08-02: 137 ug via ORAL
  Filled 2017-08-01 (×2): qty 1

## 2017-08-01 MED ORDER — PROPOFOL 10 MG/ML IV BOLUS
INTRAVENOUS | Status: DC | PRN
  Administered 2017-08-01: 140 mg via INTRAVENOUS

## 2017-08-01 MED ORDER — BUPIVACAINE-EPINEPHRINE (PF) 0.25% -1:200000 IJ SOLN
INTRAMUSCULAR | Status: AC
  Filled 2017-08-01: qty 30

## 2017-08-01 MED ORDER — EVICEL 5 ML EX KIT
PACK | Freq: Once | CUTANEOUS | Status: AC
  Administered 2017-08-01: 1
  Filled 2017-08-01: qty 1

## 2017-08-01 MED ORDER — ENOXAPARIN SODIUM 40 MG/0.4ML ~~LOC~~ SOLN
40.0000 mg | SUBCUTANEOUS | Status: DC
  Administered 2017-08-02 – 2017-08-03 (×2): 40 mg via SUBCUTANEOUS
  Filled 2017-08-01 (×2): qty 0.4

## 2017-08-01 MED ORDER — AMLODIPINE BESYLATE 5 MG PO TABS
5.0000 mg | ORAL_TABLET | Freq: Every day | ORAL | Status: DC
  Administered 2017-08-01 – 2017-08-03 (×3): 5 mg via ORAL
  Filled 2017-08-01 (×3): qty 1

## 2017-08-01 MED ORDER — PROMETHAZINE HCL 25 MG/ML IJ SOLN: 6.2500 mg | INTRAMUSCULAR | Status: DC | PRN

## 2017-08-01 MED ORDER — CEFAZOLIN SODIUM-DEXTROSE 2-4 GM/100ML-% IV SOLN
2.0000 g | INTRAVENOUS | Status: AC
  Administered 2017-08-01: 2 g via INTRAVENOUS
  Filled 2017-08-01: qty 100

## 2017-08-01 MED ORDER — GABAPENTIN 300 MG PO CAPS
300.0000 mg | ORAL_CAPSULE | Freq: Two times a day (BID) | ORAL | Status: DC
  Administered 2017-08-01 – 2017-08-03 (×5): 300 mg via ORAL
  Filled 2017-08-01 (×5): qty 1

## 2017-08-01 MED ORDER — PHENYLEPHRINE 40 MCG/ML (10ML) SYRINGE FOR IV PUSH (FOR BLOOD PRESSURE SUPPORT)
PREFILLED_SYRINGE | INTRAVENOUS | Status: AC
Start: 2017-08-01 — End: ?
  Filled 2017-08-01: qty 10

## 2017-08-01 MED ORDER — BUPIVACAINE-EPINEPHRINE 0.25% -1:200000 IJ SOLN
INTRAMUSCULAR | Status: DC | PRN
  Administered 2017-08-01: 25 mL

## 2017-08-01 MED ORDER — FENTANYL CITRATE (PF) 100 MCG/2ML IJ SOLN
INTRAMUSCULAR | Status: DC | PRN
  Administered 2017-08-01 (×2): 50 ug via INTRAVENOUS

## 2017-08-01 MED ORDER — LIDOCAINE 2% (20 MG/ML) 5 ML SYRINGE
INTRAMUSCULAR | Status: AC
Start: 2017-08-01 — End: ?
  Filled 2017-08-01: qty 5

## 2017-08-01 MED ORDER — ONDANSETRON HCL 4 MG/2ML IJ SOLN
INTRAMUSCULAR | Status: DC | PRN
  Administered 2017-08-01: 4 mg via INTRAVENOUS

## 2017-08-01 MED ORDER — ROCURONIUM BROMIDE 10 MG/ML (PF) SYRINGE
PREFILLED_SYRINGE | INTRAVENOUS | Status: DC | PRN
  Administered 2017-08-01: 50 mg via INTRAVENOUS
  Administered 2017-08-01: 10 mg via INTRAVENOUS

## 2017-08-01 MED ORDER — ONDANSETRON HCL 4 MG/2ML IJ SOLN
INTRAMUSCULAR | Status: AC
  Filled 2017-08-01: qty 2

## 2017-08-01 SURGICAL SUPPLY — 28 items
BINDER ABDOMINAL 12 ML 46-62 (SOFTGOODS) ×6 IMPLANT
DECANTER SPIKE VIAL GLASS SM (MISCELLANEOUS) IMPLANT
DERMABOND ADVANCED (GAUZE/BANDAGES/DRESSINGS) ×2
DERMABOND ADVANCED .7 DNX12 (GAUZE/BANDAGES/DRESSINGS) ×1 IMPLANT
DEVICE TROCAR PUNCTURE CLOSURE (ENDOMECHANICALS) ×3 IMPLANT
DRAIN CHANNEL 19F RND (DRAIN) IMPLANT
DRAPE INCISE IOBAN 66X45 STRL (DRAPES) ×3 IMPLANT
DRAPE LAPAROSCOPIC ABDOMINAL (DRAPES) ×3 IMPLANT
DRSG OPSITE POSTOP 4X12 (GAUZE/BANDAGES/DRESSINGS) ×3 IMPLANT
ELECT REM PT RETURN 15FT ADLT (MISCELLANEOUS) ×3 IMPLANT
EVACUATOR SILICONE 100CC (DRAIN) IMPLANT
GAUZE SPONGE 4X4 12PLY STRL (GAUZE/BANDAGES/DRESSINGS) IMPLANT
GLOVE BIO SURGEON STRL SZ7.5 (GLOVE) ×3 IMPLANT
GOWN STRL REUS W/TWL XL LVL3 (GOWN DISPOSABLE) ×6 IMPLANT
GRASPER SUT TROCAR 14GX15 (MISCELLANEOUS) IMPLANT
KIT BASIN OR (CUSTOM PROCEDURE TRAY) ×3 IMPLANT
MARKER SKIN DUAL TIP RULER LAB (MISCELLANEOUS) ×3 IMPLANT
MESH ULTRAPRO 12X12 30X30CM (Mesh General) ×3 IMPLANT
NEEDLE HYPO 22GX1.5 SAFETY (NEEDLE) IMPLANT
PACK GENERAL/GYN (CUSTOM PROCEDURE TRAY) ×3 IMPLANT
STAPLER VISISTAT 35W (STAPLE) ×3 IMPLANT
SUT NOVA NAB DX-16 0-1 5-0 T12 (SUTURE) ×6 IMPLANT
SUT PDS AB 1 CT1 27 (SUTURE) ×12 IMPLANT
SUT PDS AB 1 TP1 96 (SUTURE) ×3 IMPLANT
SUT VIC AB 0 UR5 27 (SUTURE) ×3 IMPLANT
SYR 20CC LL (SYRINGE) ×3 IMPLANT
TOWEL OR 17X26 10 PK STRL BLUE (TOWEL DISPOSABLE) ×3 IMPLANT
TRAY FOLEY MTR SLVR 16FR STAT (SET/KITS/TRAYS/PACK) ×3 IMPLANT

## 2017-08-01 NOTE — Anesthesia Postprocedure Evaluation (Signed)
Anesthesia Post Note  Patient: Zachary Cook  Procedure(s) Performed: OPEN REPAIR INICISIONAL HERNIA (N/A Abdomen) INSERTION OF MESH (N/A Abdomen)     Patient location during evaluation: PACU Anesthesia Type: General Level of consciousness: awake and alert Pain management: pain level controlled Vital Signs Assessment: post-procedure vital signs reviewed and stable Respiratory status: spontaneous breathing, nonlabored ventilation and respiratory function stable Cardiovascular status: blood pressure returned to baseline and stable Postop Assessment: no apparent nausea or vomiting Anesthetic complications: no    Last Vitals:  Vitals:   08/01/17 1023 08/01/17 1122  BP: (!) 154/81 (!) 143/79  Pulse: 65 68  Resp: 18 16  Temp: 37 C 37.1 C  SpO2: 95% 97%    Last Pain:  Vitals:   08/01/17 1122  TempSrc: Oral  PainSc:                  Lynda Rainwater

## 2017-08-01 NOTE — Interval H&P Note (Signed)
History and Physical Interval Note:  08/01/2017 7:11 AM  Zachary Cook  has presented today for surgery, with the diagnosis of multiple incisional hernias  The various methods of treatment have been discussed with the patient and family. After consideration of risks, benefits and other options for treatment, the patient has consented to  Procedure(s) with comments: [place] (N/A) - TAP BLOCK INSERTION OF MESH (N/A) as a surgical intervention .  The patient's history has been reviewed, patient examined, no change in status, stable for surgery.  I have reviewed the patient's chart and labs.  Questions were answered to the patient's satisfaction.     Rosario Jacks., Anne Hahn

## 2017-08-01 NOTE — Transfer of Care (Signed)
Immediate Anesthesia Transfer of Care Note  Patient: Zachary Cook  Procedure(s) Performed: OPEN REPAIR INICISIONAL HERNIA (N/A Abdomen) INSERTION OF MESH (N/A Abdomen)  Patient Location: PACU  Anesthesia Type:General  Level of Consciousness: awake, alert  and oriented  Airway & Oxygen Therapy: Patient Spontanous Breathing and Patient connected to face mask oxygen  Post-op Assessment: Report given to RN and Post -op Vital signs reviewed and stable  Post vital signs: Reviewed and stable  Last Vitals:  Vitals Value Taken Time  BP    Temp    Pulse 60 08/01/2017  9:36 AM  Resp 20 08/01/2017  9:36 AM  SpO2 100 % 08/01/2017  9:36 AM  Vitals shown include unvalidated device data.  Last Pain:  Vitals:   08/01/17 0604  TempSrc: Oral         Complications: No apparent anesthesia complications

## 2017-08-01 NOTE — Anesthesia Preprocedure Evaluation (Signed)
Anesthesia Evaluation  Patient identified by MRN, date of birth, ID band Patient awake    Reviewed: Allergy & Precautions, NPO status , Patient's Chart, lab work & pertinent test results  Airway Mallampati: II  TM Distance: >3 FB Neck ROM: Full    Dental no notable dental hx. (+) Dental Advisory Given   Pulmonary neg pulmonary ROS,    Pulmonary exam normal breath sounds clear to auscultation       Cardiovascular hypertension, Pt. on medications Normal cardiovascular exam Rhythm:Regular Rate:Normal  EKG - SR with LAFB, inferior Q waves   Neuro/Psych negative neurological ROS  negative psych ROS   GI/Hepatic Neg liver ROS, hiatal hernia, GERD  Medicated,  Endo/Other  diabetes, Type 2, Oral Hypoglycemic AgentsHypothyroidism Obesity  Renal/GU CRFRenal disease  negative genitourinary   Musculoskeletal negative musculoskeletal ROS (+)   Abdominal   Peds negative pediatric ROS (+)  Hematology  (+) anemia ,   Anesthesia Other Findings   Reproductive/Obstetrics negative OB ROS                             Anesthesia Physical  Anesthesia Plan  ASA: III  Anesthesia Plan: General   Post-op Pain Management:  Regional for Post-op pain   Induction: Intravenous  PONV Risk Score and Plan: 2 and Ondansetron and Midazolam  Airway Management Planned: Oral ETT  Additional Equipment: None  Intra-op Plan:   Post-operative Plan: Extubation in OR  Informed Consent: I have reviewed the patients History and Physical, chart, labs and discussed the procedure including the risks, benefits and alternatives for the proposed anesthesia with the patient or authorized representative who has indicated his/her understanding and acceptance.   Dental advisory given  Plan Discussed with: CRNA, Surgeon and Anesthesiologist  Anesthesia Plan Comments:         Anesthesia Quick Evaluation

## 2017-08-01 NOTE — Anesthesia Procedure Notes (Signed)
Procedure Name: Intubation Date/Time: 08/01/2017 7:34 AM Performed by: [place] Annalycia Done C, CRNA Pre-anesthesia Checklist: Patient identified, Emergency Drugs available, Suction available and Patient being monitored Patient Re-evaluated:Patient Re-evaluated prior to induction Oxygen Delivery Method: Circle system utilized Preoxygenation: Pre-oxygenation with 100% oxygen Induction Type: IV induction Ventilation: Mask ventilation without difficulty Laryngoscope Size: Mac and 3 Grade View: Grade I Tube type: Oral Number of attempts: 1 Airway Equipment and Method: Stylet and Oral airway Placement Confirmation: ETT inserted through vocal cords under direct vision,  positive ETCO2 and breath sounds checked- equal and bilateral Secured at: 21 cm Tube secured with: Tape Dental Injury: Teeth and Oropharynx as per pre-operative assessment

## 2017-08-01 NOTE — Op Note (Signed)
08/01/2017  9:24 AM  PATIENT:  Zachary Cook  84 y.o. male  PRE-OPERATIVE DIAGNOSIS:  multiple incisional hernias  POST-OPERATIVE DIAGNOSIS:  multiple incisional hernias  PROCEDURE:  Procedure(s) with comments: OPEN REPAIR INICISIONAL HERNIA RETRORECTUS (N/A) - TAP BLOCK INSERTION OF MESH (N/A)  SURGEON:  Surgeon(s) and Role:    * Ralene Ok, MD - Primary  ANESTHESIA:   local and general  EBL:  25cc   BLOOD ADMINISTERED:none  DRAINS: none   LOCAL MEDICATIONS USED:  BUPIVICAINE   SPECIMEN:  No Specimen  DISPOSITION OF SPECIMEN:  N/A  COUNTS:  YES  TOURNIQUET:  * No tourniquets in log *  DICTATION: .Dragon Dictation  After the patient was consented he was taken back to the operating room and placed in the supine position with bilateral SCDs in place. The patient was prepped and draped in usual sterile fashion. Antibiotics were confirmed and timeout was called and all facts verified.  At this time I proceeded to excise the skin scar. This was discarded. I proceeded to use electrocautery to maintain hemostasis and dissection took place in the most superior portion of the wound down to the subcutaneous tissues fat to the anterior fascia. This was incised. The fascia was elevated and 2 Kocher clamps.  The hernia sac and the abdominal cavity were entered bluntly. The abdomen in the midline was free of adhesions.  The incision was extended inferiorly to just beyond the umbilicus. The hernia sacs were excised.  There were minimal interior adhesions.  At this time I proceeded to retract is rectus muscles medially.  At this time the posterior fascia was then incised.  Using blunt dissection the belly was dissected away from the posterior rectus fascia.  This was done inferiorly and superiorly.  At the midline inferior and superior portions of the linea alba this was taken down from the anterior abdominal wall.  This was done bilaterally to behind the rectus muscle  approximately 5-6cm laterally.  The area was checked for hemostasis.  At this time the posterior rectus fascia was reapproximated using #1 PDS in a standard running fashion x2.  0.25% bupivicaine was used to inject the bilateral perforating nerves.  I proceeded to irrigate out the retrorectus space.  The area was measured and seemed to be approximately 30 x 15 cm in size.  A piece of 30cm x 30cm UltraPro mesh was selected, cut to size and placed into the retrorectus space.  This fit well and flat. #1 Novafils were used as transfascial sutures via stab incisions x 3 bilaterally and two superiorly and inferiorly.  An endoclose device was used to placed the transfascial sutures. 10cc of Eviseal glue was then used to glue the mesh to the midline fascia.  At this time the anterior fascia and midline were reapproximated using #1 PDS in a standard running fashion x2.  The subcutaneous tissue was irrigated out with sterile saline.  The skin was then reapproximated using skin staples.  The midline wound was then dressed with honeycomb dressing.  The patient tolerated procedure well was taken to the recovery room in stable condition.   PLAN OF CARE: Admit to inpatient   PATIENT DISPOSITION:  PACU - hemodynamically stable.   Delay start of Pharmacological VTE agent (>24hrs) due to surgical blood loss or risk of bleeding: yes

## 2017-08-01 NOTE — Progress Notes (Addendum)
Inpatient Diabetes Program Recommendations  AACE/ADA: New Consensus Statement on Inpatient Glycemic Control (2015)  Target Ranges:  Prepandial:   less than 140 mg/dL      Peak postprandial:   less than 180 mg/dL (1-2 hours)      Critically ill patients:  140 - 180 mg/dL   Results for NASHTON, BELSON (MRN [id number]) as of 08/01/2017 09:54  Ref. Range 08/01/2017 05:56 08/01/2017 09:37  Glucose-Capillary Latest Ref Range: 65 - 99 mg/dL 109 (H) 121 (H)    Home DM Meds: Toujeo 20 units QPM        Metformin 1000 mg QPM        Amaryl 4 mg QPM        Victoza 1.2 mg daily  Current Orders: Novolog Resistant Correction Scale/ SSI (0-20 units) TID AC (Signed and Held)       Note patient admitted for hernia repair.  Sees Dr. Dwyane Dee with [facility] for DM management.  Note patient has Signed and Held orders to start Novolog Resistant SSI after surgery today.     MD- May consider starting 50% of patient's home dose of basal insulin tonight.  Lantus 10 units QHS  Hold off on restarting home oral DM medications until taking adequate fluids and meals    --Will follow patient during hospitalization--  Wyn Quaker RN, MSN, CDE Diabetes Coordinator Inpatient Glycemic Control Team Team Pager: [phone] (8a-5p)

## 2017-08-02 LAB — CBC
HEMATOCRIT: 35.6 % — AB (ref 39.0–52.0)
Hemoglobin: 11.2 g/dL — ABNORMAL LOW (ref 13.0–17.0)
MCH: 28.1 pg (ref 26.0–34.0)
MCHC: 31.5 g/dL (ref 30.0–36.0)
MCV: 89.4 fL (ref 78.0–100.0)
Platelets: 185 10*3/uL (ref 150–400)
RBC: 3.98 MIL/uL — ABNORMAL LOW (ref 4.22–5.81)
RDW: 14.3 % (ref 11.5–15.5)
WBC: 11.3 10*3/uL — ABNORMAL HIGH (ref 4.0–10.5)

## 2017-08-02 LAB — BASIC METABOLIC PANEL
Anion gap: 9 (ref 5–15)
BUN: 34 mg/dL — AB (ref 6–20)
CHLORIDE: 109 mmol/L (ref 101–111)
CO2: 22 mmol/L (ref 22–32)
Calcium: 9.3 mg/dL (ref 8.9–10.3)
Creatinine, Ser: 1.5 mg/dL — ABNORMAL HIGH (ref 0.61–1.24)
GFR calc Af Amer: 48 mL/min — ABNORMAL LOW (ref >60)
GFR calc non Af Amer: 41 mL/min — ABNORMAL LOW (ref >60)
GLUCOSE: 119 mg/dL — AB (ref 65–99)
POTASSIUM: 5.4 mmol/L — AB (ref 3.5–5.1)
Sodium: 140 mmol/L (ref 135–145)

## 2017-08-02 LAB — GLUCOSE, CAPILLARY
Glucose-Capillary: 113 mg/dL — ABNORMAL HIGH (ref 65–99)
Glucose-Capillary: 181 mg/dL — ABNORMAL HIGH (ref 65–99)
Glucose-Capillary: 227 mg/dL — ABNORMAL HIGH (ref 65–99)
Glucose-Capillary: 176 mg/dL — ABNORMAL HIGH (ref 65–99)

## 2017-08-02 MED ORDER — LEVOTHYROXINE SODIUM 25 MCG PO TABS: 137.0000 ug | ORAL_TABLET | Freq: Every day | ORAL | Status: DC

## 2017-08-02 NOTE — Progress Notes (Addendum)
[facility] Office:  [phone] General Surgery Progress Note   LOS: 1 day  POD -  1 Day Post-Op  Chief Complaint: Hernia  Assessment and Plan: 1.  OPEN REPAIR INICISIONAL HERNIA, INSERTION OF MESH - 08/01/2017 - Zachary Cook (Dr. Dalbert Batman was the original surgery, but had to pass off the case)  Advance diet, increase ambulation, possibly home in the AM.  2.  DM  Followed by Dr. Dwyane Dee 3.  History of open right colectomy - 06/06/2016 - [place] 4.  Barrett's esophagus 5.  HTN 6.  DVT prophylaxis - Lovenox 7.  Mild chronic kidney disease  Creatinine -1.5 - 08/02/2017 (this was elevated pre op)  Recheck labs in AM.   Active Problems:   S/P hernia repair  Subjective:  Has done well with the liquids.  Has ambulated on the floor.  Objective:   Vitals:   08/02/17 0137 08/02/17 0533  BP: 130/75 (!) 152/67  Pulse: 61 66  Resp: 18 18  Temp: 97.9 F (36.6 C) 98.1 F (36.7 C)  SpO2: 95% 93%     Intake/Output from previous day:  05/24 0701 - 05/25 0700 In: 3879 [P.O.:360; I.V.:3419; IV Piggyback:100] Out: 360 [Urine:340; Blood:20]  Intake/Output this shift:  No intake/output data recorded.   Physical Exam:   General: WN overweight wM who is alert and oriented.    HEENT: Normal. Pupils equal. .   Lungs: Clear.  IS = 1,500 cc   Abdomen: has binder.  Has BS.   Wound: Covered   Lab Results:    Recent Labs    08/02/17 0404  WBC 11.3*  HGB 11.2*  HCT 35.6*  PLT 185    BMET   Recent Labs    08/02/17 0404  NA 140  K 5.4*  CL 109  CO2 22  GLUCOSE 119*  BUN 34*  CREATININE 1.50*  CALCIUM 9.3    PT/INR  No results for input(s): LABPROT, INR in the last 72 hours.  ABG  No results for input(s): PHART, HCO3 in the last 72 hours.  Invalid input(s): PCO2, PO2   Studies/Results:  No results found.   Anti-infectives:   Anti-infectives (From admission, onward)   Start     Dose/Rate Route Frequency Ordered Stop   08/01/17 0600  ceFAZolin (ANCEF) IVPB  2g/100 mL premix     2 g 200 mL/hr over 30 Minutes Intravenous On call to O.R. 08/01/17 0551 08/01/17 0726      Alphonsa Overall, MD, FACS Pager: [facility] Office: [phone] 08/02/2017

## 2017-08-03 LAB — BASIC METABOLIC PANEL
Anion gap: 11 (ref 5–15)
BUN: 23 mg/dL — ABNORMAL HIGH (ref 6–20)
CHLORIDE: 110 mmol/L (ref 101–111)
CO2: 18 mmol/L — ABNORMAL LOW (ref 22–32)
CREATININE: 1.21 mg/dL (ref 0.61–1.24)
Calcium: 9 mg/dL (ref 8.9–10.3)
GFR calc Af Amer: >60 mL/min (ref >60)
GFR, EST NON AFRICAN AMERICAN: 54 mL/min — AB (ref >60)
Glucose, Bld: 181 mg/dL — ABNORMAL HIGH (ref 65–99)
POTASSIUM: 5.2 mmol/L — AB (ref 3.5–5.1)
SODIUM: 139 mmol/L (ref 135–145)

## 2017-08-03 LAB — GLUCOSE, CAPILLARY: GLUCOSE-CAPILLARY: 161 mg/dL — AB (ref 65–99)

## 2017-08-03 MED ORDER — GABAPENTIN 300 MG PO CAPS: 300.0000 mg | ORAL_CAPSULE | Freq: Two times a day (BID) | ORAL | 0 refills | Status: DC

## 2017-08-03 MED ORDER — HYDROCODONE-ACETAMINOPHEN 5-325 MG PO TABS: 1.0000 | ORAL_TABLET | Freq: Four times a day (QID) | ORAL | 0 refills | Status: DC | PRN

## 2017-08-03 NOTE — Progress Notes (Signed)
Assessment unchanged. Pt and wife verbalized understanding of dc instructions through teach back regarding meds to resume, follow up care, and when to call the doctor. Script x 1 given per MD. Discharged via wc to front entrance accompanied by wife and NT.

## 2017-08-03 NOTE — Discharge Instructions (Signed)
[facility] _______[facility]  HERNIA REPAIR: POST OP INSTRUCTIONS  Always review your discharge instruction sheet given to you by the facility where your surgery was performed. IF YOU HAVE DISABILITY OR FAMILY LEAVE FORMS, YOU MUST BRING THEM TO THE OFFICE FOR PROCESSING.   DO NOT GIVE THEM TO YOUR DOCTOR.  1. A  prescription for pain medication may be given to you upon discharge.  Take your pain medication as prescribed, if needed.  If narcotic pain medicine is not needed, then you may take acetaminophen (Tylenol) or ibuprofen (Advil) as needed. 2. Take your usually prescribed medications unless otherwise directed. If you need a refill on your pain medication, please contact your pharmacy.  They will contact our office to request authorization. Prescriptions will not be filled after 5 pm or on week-ends. 3. You should follow a light diet the first 24 hours after arrival home, such as soup and crackers, etc.  Be sure to include lots of fluids daily.  Resume your normal diet the day after surgery. 4.Most patients will experience some swelling and bruising around the umbilicus or in the groin and scrotum.  Ice packs and reclining will help.  Swelling and bruising can take several days to resolve.  6. It is common to experience some constipation if taking pain medication after surgery.  Increasing fluid intake and taking a stool softener (such as Colace) will usually help or prevent this problem from occurring.  A mild laxative (Milk of Magnesia or Miralax) should be taken according to package directions if there are no bowel movements after 48 hours. 7. Unless discharge instructions indicate otherwise, you may remove your bandages 24-48 hours after surgery, and you may shower at that time.  You may have steri-strips (small skin tapes) in place directly over the incision.  These strips should be left on the skin for 7-10 days.  If your surgeon used skin glue on the incision, you may shower in  24 hours.  The glue will flake off over the next 2-3 weeks.  Any sutures or staples will be removed at the office during your follow-up visit. 8. ACTIVITIES:  You may resume regular (light) daily activities beginning the next day--such as daily self-care, walking, climbing stairs--gradually increasing activities as tolerated.  You may have sexual intercourse when it is comfortable.  Refrain from any heavy lifting or straining until approved by your doctor.  a.You may drive when you are no longer taking prescription pain medication, you can comfortably wear a seatbelt, and you can safely maneuver your car and apply brakes. b.RETURN TO WORK:   _____________________________________________  9.You should see your doctor in the office for a follow-up appointment approximately 2-3 weeks after your surgery.  Make sure that you call for this appointment within a day or two after you arrive home to insure a convenient appointment time.  WHEN TO CALL YOUR DOCTOR: 1. Fever over 101.0 2. Inability to urinate 3. Nausea and/or vomiting 4. Extreme swelling or bruising 5. Continued bleeding from incision. 6. Increased pain, redness, or drainage from the incision  The clinic staff is available to answer your questions during regular business hours.  Please dont hesitate to call and ask to speak to one of the nurses for clinical concerns.  If you have a medical emergency, go to the nearest emergency room or call 911.  A surgeon from [facility] is always on call at the hospital   [address] [place], [place], [place]  [address] ?  [id number], [place], [place]   [address] [phone] ? [phone] ? FAX [phone] Web site: [email or web address]  [facility] - DISCHARGE INSTRUCTIONS TO PATIENT  Activity:  Driving - May drive in 4 to 5 days, if off pain meds and doing well   Lifting - No lifting > 15 pounds for 4 weeks.  Wound Care:   You may shower.          Leave bandage for 2 more days, then remove bandage         Wear abdominal binder  Diet:  As tolerated  Follow up appointment:  Call Dr. Johney Frame office [facility]) at [phone] for an appointment in 10 to 14 days.  Medications and dosages:  Resume your home medications.  You have a prescription for:  Vicodin and Neurontin  Call Dr. Rosendo Gros or his office  [phone]) if you have:  Temperature greater than 100.4,  Persistent nausea and vomiting,  Severe uncontrolled pain,  Redness, tenderness, or signs of infection (pain, swelling, redness, odor or green/yellow discharge around the site),  Difficulty breathing, headache or visual disturbances,  Any other questions or concerns you may have after discharge.  In an emergency, call 911 or go to an Emergency Department at a nearby hospital.

## 2017-08-03 NOTE — Discharge Summary (Signed)
Physician Discharge Summary  Patient ID:  Zachary Cook  MRN: [id number]  DOB/AGE: 1932-06-20 84 y.o.  Admit date: 08/01/2017 Discharge date: 08/03/2017  Discharge Diagnoses:  1.  Abdominal incisional hernia 2.  DM             Followed by Dr. Dwyane Dee 3.  History of open right colectomy - 06/06/2016 - [place] 4.  Barrett's esophagus 5.  HTN 6.  DVT prophylaxis - Lovenox 7.  Mild chronic kidney disease   Active Problems:   S/P hernia repair  Operation: Procedure(s): OPEN REPAIR INICISIONAL HERNIA, INSERTION OF MESH on 08/01/2017 Zachary Cook  Discharged Condition: good  Hospital Course: Zachary Cook is an 84 y.o. male whose primary care physician is [name], MD and who was admitted 08/01/2017 with a chief complaint of incisional hernia.   He was brought to the operating room on 08/01/2017 and underwent  OPEN REPAIR INICISIONAL HERNIA, INSERTION OF MESH.   He has some mild renal dysfunction, but his creatinine returned to normal today. He has passed some flatus and is taking reg diet. He is ready to go home.  The discharge instructions were reviewed with the patient.  Consults: None  Significant Diagnostic Studies: Results for orders placed or performed during the hospital encounter of 08/01/17  Glucose, capillary  Result Value Ref Range   Glucose-Capillary 109 (H) 65 - 99 mg/dL   Comment 1 Notify RN    Comment 2 Document in Chart   Glucose, capillary  Result Value Ref Range   Glucose-Capillary 121 (H) 65 - 99 mg/dL  Glucose, capillary  Result Value Ref Range   Glucose-Capillary 157 (H) 65 - 99 mg/dL  Basic metabolic panel  Result Value Ref Range   Sodium 140 135 - 145 mmol/L   Potassium 5.4 (H) 3.5 - 5.1 mmol/L   Chloride 109 101 - 111 mmol/L   CO2 22 22 - 32 mmol/L   Glucose, Bld 119 (H) 65 - 99 mg/dL   BUN 34 (H) 6 - 20 mg/dL   Creatinine, Ser 1.50 (H) 0.61 - 1.24 mg/dL   Calcium 9.3 8.9 - 10.3 mg/dL   GFR calc non Af Amer 41 (L) >60 mL/min   GFR  calc Af Amer 48 (L) >60 mL/min   Anion gap 9 5 - 15  CBC  Result Value Ref Range   WBC 11.3 (H) 4.0 - 10.5 K/uL   RBC 3.98 (L) 4.22 - 5.81 MIL/uL   Hemoglobin 11.2 (L) 13.0 - 17.0 g/dL   HCT 35.6 (L) 39.0 - 52.0 %   MCV 89.4 78.0 - 100.0 fL   MCH 28.1 26.0 - 34.0 pg   MCHC 31.5 30.0 - 36.0 g/dL   RDW 14.3 11.5 - 15.5 %   Platelets 185 150 - 400 K/uL  Glucose, capillary  Result Value Ref Range   Glucose-Capillary 113 (H) 65 - 99 mg/dL  Glucose, capillary  Result Value Ref Range   Glucose-Capillary 166 (H) 65 - 99 mg/dL  Glucose, capillary  Result Value Ref Range   Glucose-Capillary 113 (H) 65 - 99 mg/dL  Glucose, capillary  Result Value Ref Range   Glucose-Capillary 181 (H) 65 - 99 mg/dL  Basic metabolic panel  Result Value Ref Range   Sodium 139 135 - 145 mmol/L   Potassium 5.2 (H) 3.5 - 5.1 mmol/L   Chloride 110 101 - 111 mmol/L   CO2 18 (L) 22 - 32 mmol/L   Glucose, Bld 181 (H) 65 - 99  mg/dL   BUN 23 (H) 6 - 20 mg/dL   Creatinine, Ser 1.21 0.61 - 1.24 mg/dL   Calcium 9.0 8.9 - 10.3 mg/dL   GFR calc non Af Amer 54 (L) >60 mL/min   GFR calc Af Amer >60 >60 mL/min   Anion gap 11 5 - 15  Glucose, capillary  Result Value Ref Range   Glucose-Capillary 227 (H) 65 - 99 mg/dL  Glucose, capillary  Result Value Ref Range   Glucose-Capillary 176 (H) 65 - 99 mg/dL  Glucose, capillary  Result Value Ref Range   Glucose-Capillary 161 (H) 65 - 99 mg/dL    No results found.  Discharge Exam:  Vitals:   08/03/17 0127 08/03/17 0512  BP: (!) 153/76 138/79  Pulse: 79 80  Resp: 14 15  Temp: 99 F (37.2 C) 98 F (36.7 C)  SpO2: 90% 91%    General: Mildly obese WM who is alert and generally healthy appearing.  Lungs: Clear to auscultation and symmetric breath sounds. Heart:  RRR. No murmur or rub. Abdomen: Soft. No mass. Has bowel sounds.       Incision looks good.  I left clear dressing intact.  Discharge Medications:   Allergies as of 08/03/2017   No Known Allergies      Medication List    TAKE these medications   ACCU-CHEK NANO SMARTVIEW w/Device Kit   ACCU-CHEK SMARTVIEW test strip Generic drug:  glucose blood USE TO TEST TWICE DAILY   amLODipine 5 MG tablet Commonly known as:  NORVASC TAKE 1 TABLET(5 MG) BY MOUTH DAILY   aspirin EC 81 MG tablet Take 81 mg by mouth daily.   atorvastatin 10 MG tablet Commonly known as:  LIPITOR TAKE 1 TABLET(10 MG) BY MOUTH DAILY AT 6 PM   B-D UF III MINI PEN NEEDLES 31G X 5 MM Misc Generic drug:  Insulin Pen Needle USE ONE PER DAY WITH VICTOZA   esomeprazole 40 MG capsule Commonly known as:  NEXIUM TAKE ONE CAPSULE BY MOUTH DAILY AT NOON   Fish Oil 1200 MG Caps Take 1,200 mg by mouth daily.   gabapentin 300 MG capsule Commonly known as:  NEURONTIN Take 1 capsule (300 mg total) by mouth 2 (two) times daily.   glimepiride 2 MG tablet Commonly known as:  AMARYL TAKE 2 TABLETS(4 MG) BY MOUTH DAILY BEFORE DINNER   HYDROcodone-acetaminophen 5-325 MG tablet Commonly known as:  NORCO/VICODIN Take 1 tablet by mouth every 6 (six) hours as needed for moderate pain.   Insulin Glargine 300 UNIT/ML Sopn Commonly known as:  TOUJEO SOLOSTAR Inject 16 Units into the skin daily. What changed:    how much to take  when to take this   levothyroxine 137 MCG tablet Commonly known as:  SYNTHROID, LEVOTHROID Take 1 tablet (137 mcg total) by mouth daily.   metFORMIN 500 MG 24 hr tablet Commonly known as:  GLUCOPHAGE-XR Take 2 tablets (1,000 mg total) by mouth daily with supper.   MULTIVITAMIN ADULT Tabs Take 1 tablet by mouth every other day.   naproxen sodium 220 MG tablet Commonly known as:  ALEVE Take 440 mg by mouth daily as needed (pain).   tamsulosin 0.4 MG Caps capsule Commonly known as:  FLOMAX TAKE ONE CAPSULE BY MOUTH DAILY What changed:    how much to take  how to take this  when to take this   VICTOZA 18 MG/3ML Sopn Generic drug:  liraglutide ADMINISTER 1.2 MG UNDER THE SKIN  DAILY  Disposition: Discharge disposition: 01-Home or Self Care       Discharge Instructions    Diet - low sodium heart healthy   Complete by:  As directed    Increase activity slowly   Complete by:  As directed       Follow-up Information    Ralene Ok, MD In 2 weeks.   Specialty:  General Surgery Why:  For wound re-check Contact information: [address] [place] [address] [phone]            Signed: Alphonsa Overall, M.D., [facility] Office:  [phone]  08/03/2017, 9:38 AM

## 2017-08-10 ENCOUNTER — Other Ambulatory Visit: Payer: Self-pay | Admitting: Family Medicine

## 2017-08-10 DIAGNOSIS — K2270 Barrett's esophagus without dysplasia: Secondary | ICD-10-CM

## 2017-08-12 ENCOUNTER — Other Ambulatory Visit: Payer: Self-pay | Admitting: Family Medicine

## 2017-08-12 DIAGNOSIS — K2270 Barrett's esophagus without dysplasia: Secondary | ICD-10-CM

## 2017-08-13 ENCOUNTER — Other Ambulatory Visit (INDEPENDENT_AMBULATORY_CARE_PROVIDER_SITE_OTHER): Payer: Medicare Other

## 2017-08-13 ENCOUNTER — Other Ambulatory Visit: Payer: Self-pay | Admitting: Family Medicine

## 2017-08-13 ENCOUNTER — Other Ambulatory Visit: Payer: Self-pay | Admitting: Endocrinology

## 2017-08-13 DIAGNOSIS — E1165 Type 2 diabetes mellitus with hyperglycemia: Secondary | ICD-10-CM

## 2017-08-13 DIAGNOSIS — Z794 Long term (current) use of insulin: Secondary | ICD-10-CM

## 2017-08-13 DIAGNOSIS — E063 Autoimmune thyroiditis: Secondary | ICD-10-CM | POA: Diagnosis not present

## 2017-08-13 LAB — COMPREHENSIVE METABOLIC PANEL
ALT: 8 U/L (ref 0–53)
AST: 13 U/L (ref 0–37)
Albumin: 3.5 g/dL (ref 3.5–5.2)
Alkaline Phosphatase: 66 U/L (ref 39–117)
BUN: 29 mg/dL — ABNORMAL HIGH (ref 6–23)
CALCIUM: 9.5 mg/dL (ref 8.4–10.5)
CHLORIDE: 102 meq/L (ref 96–112)
CO2: 26 meq/L (ref 19–32)
CREATININE: 1.68 mg/dL — AB (ref 0.40–1.50)
GFR: 41.68 mL/min — ABNORMAL LOW (ref >60.00)
Glucose, Bld: 266 mg/dL — ABNORMAL HIGH (ref 70–99)
POTASSIUM: 5 meq/L (ref 3.5–5.1)
Sodium: 136 mEq/L (ref 135–145)
Total Bilirubin: 0.5 mg/dL (ref 0.2–1.2)
Total Protein: 6.9 g/dL (ref 6.0–8.3)

## 2017-08-13 LAB — TSH: TSH: 1.41 u[IU]/mL (ref 0.35–4.50)

## 2017-08-13 LAB — HEMOGLOBIN A1C: Hgb A1c MFr Bld: 7.4 % — ABNORMAL HIGH (ref 4.6–6.5)

## 2017-08-13 LAB — T4, FREE: FREE T4: 1.46 ng/dL (ref 0.60–1.60)

## 2017-08-14 ENCOUNTER — Other Ambulatory Visit: Payer: Self-pay | Admitting: Endocrinology

## 2017-08-18 ENCOUNTER — Ambulatory Visit: Payer: Medicare Other | Admitting: Endocrinology

## 2017-08-18 ENCOUNTER — Encounter: Payer: Self-pay | Admitting: Endocrinology

## 2017-08-18 VITALS — BP 128/70 | HR 84 | Ht 64.0 in | Wt 171.0 lb

## 2017-08-18 DIAGNOSIS — Z794 Long term (current) use of insulin: Secondary | ICD-10-CM | POA: Diagnosis not present

## 2017-08-18 DIAGNOSIS — E063 Autoimmune thyroiditis: Secondary | ICD-10-CM

## 2017-08-18 DIAGNOSIS — E1165 Type 2 diabetes mellitus with hyperglycemia: Secondary | ICD-10-CM

## 2017-08-18 NOTE — Progress Notes (Signed)
Patient ID: Zachary Cook, male   DOB: 09/04/1932, 84 y.o.   MRN: [id number]           Reason for Appointment: Follow-Up    History of Present Illness:          Diagnosis: Type 2 diabetes mellitus, date of diagnosis: 2005       Past history: He was started on metformin when he was found to have diabetes on routine lab work. Details of this are not available He thinks his blood sugars had been fairly well controlled for several years with metformin alone but no records are available He has had a couple of different physicians and has not always been followed consistently for his diabetes  Blood sugars had been significantly higher in the 4 months prior to his consultation in 10/15 and his A1c had gone up to 12% with his regimen of glipizide and metformin  He was given a trial of Invokana but this did not improve his blood sugars He has been on Victoza since 10/15 and this was restarted in 1/16, initially had difficulty with nausea  Recent history:    INSULIN regimen: Toujeo 20 units daily around 6 PM  Non-insulin hypoglycemic drugs : Metformin ER 500 mg, 2 tablets daily, Amaryl 2 mg at supper, Victoza 1.2 mg daily  His A1c is 7.4, previously was 7.1 but lower than expected for his home blood sugars as before  Current blood sugar patterns and problems identified:  He has repeatedly been forgetting to check his sugars after meals although has 3 or 4 readings in the last month  Now he is eating his main meal in the evening around 5-7 PM and not clear if some of his readings are before eating  Previously was eating mostly one meal in the early afternoon  However his blood sugars are still fluctuating at all times  Recently even when he had a normal blood sugar in the morning he had a reading of 266 in the lab in the early afternoon without a meals  FASTING blood sugars are overall high but they are normally times  Today his sugar was high because likely because of eating a  chocolate cake at dinnertime and sugar was 277 afterwards  During the day he is eating mostly carbohydrates with fruits and toast and using a protein  Currently not active, recently recovering from hernia surgery  Not able to lose weight       Glucose monitoring:  done 1 times a day         Glucometer: Accu-Chek       Blood Glucose readings    Mean values apply above for all meters except median for One Touch  PRE-MEAL Fasting Lunch Dinner Bedtime Overall  Glucose range:  81-269      Mean/median:  176     178   POST-MEAL PC Breakfast PC Lunch PC Dinner  Glucose range:    121- 277  Mean/median:    182    Self-care: The diet that the patient has been following is: None, usually low fat      Meals:  Mostly 5 pm         Exercise: walking a little recently  Dietician visit, most recent: None.               Weight history: Previous range  175-205  Wt Readings from Last 3 Encounters:  08/18/17 171 lb (77.6 kg)  08/03/17 186 lb 11.7 oz (84.7 kg)  07/30/17 182 lb 12.8 oz (82.9 kg)    Glycemic control:   Lab Results  Component Value Date   HGBA1C 7.4 (H) 08/13/2017   HGBA1C 7.1 (H) 06/13/2017   HGBA1C 6.8 (H) 05/28/2017   Lab Results  Component Value Date   MICROALBUR 54.1 (H) 07/24/2017   LDLCALC 89 05/12/2017   CREATININE 1.68 (H) 08/13/2017    Lab Results  Component Value Date   FRUCTOSAMINE 281 07/24/2017   FRUCTOSAMINE 322 (H) 05/05/2017   FRUCTOSAMINE 323 (H) 11/28/2016    OTHER active problems: See review of systems   Lab on 08/13/2017  Component Date Value Ref Range Status  . Free T4 08/13/2017 1.46  0.60 - 1.60 ng/dL Final   Comment: Specimens from patients who are undergoing biotin therapy and /or ingesting biotin supplements may contain high levels of biotin.  The higher biotin concentration in these specimens interferes with this Free T4 assay.  Specimens that contain high levels  of biotin may cause false high results for this Free T4 assay.   Please interpret results in light of the total clinical presentation of the patient.    Marland Kitchen TSH 08/13/2017 1.41  0.35 - 4.50 uIU/mL Final  . Sodium 08/13/2017 136  135 - 145 mEq/L Final  . Potassium 08/13/2017 5.0  3.5 - 5.1 mEq/L Final  . Chloride 08/13/2017 102  96 - 112 mEq/L Final  . CO2 08/13/2017 26  19 - 32 mEq/L Final  . Glucose, Bld 08/13/2017 266* 70 - 99 mg/dL Final  . BUN 08/13/2017 29* 6 - 23 mg/dL Final  . Creatinine, Ser 08/13/2017 1.68* 0.40 - 1.50 mg/dL Final  . Total Bilirubin 08/13/2017 0.5  0.2 - 1.2 mg/dL Final  . Alkaline Phosphatase 08/13/2017 66  39 - 117 U/L Final  . AST 08/13/2017 13  0 - 37 U/L Final  . ALT 08/13/2017 8  0 - 53 U/L Final  . Total Protein 08/13/2017 6.9  6.0 - 8.3 g/dL Final  . Albumin 08/13/2017 3.5  3.5 - 5.2 g/dL Final  . Calcium 08/13/2017 9.5  8.4 - 10.5 mg/dL Final  . GFR 08/13/2017 41.68* >60.00 mL/min Final  . Hgb A1c MFr Bld 08/13/2017 7.4* 4.6 - 6.5 % Final   Glycemic Control Guidelines for People with Diabetes:Non Diabetic:  <6%Goal of Therapy: <7%Additional Action Suggested:  >8%       Allergies as of 08/18/2017   No Known Allergies     Medication List        Accurate as of 08/18/17  3:56 PM. Always use your most recent med list.          ACCU-CHEK NANO SMARTVIEW w/Device Kit   ACCU-CHEK SMARTVIEW test strip Generic drug:  glucose blood USE TO TEST TWICE DAILY   amLODipine 5 MG tablet Commonly known as:  NORVASC TAKE 1 TABLET(5 MG) BY MOUTH DAILY   aspirin EC 81 MG tablet Take 81 mg by mouth daily.   atorvastatin 10 MG tablet Commonly known as:  LIPITOR TAKE 1 TABLET(10 MG) BY MOUTH DAILY AT 6 PM   B-D UF III MINI PEN NEEDLES 31G X 5 MM Misc Generic drug:  Insulin Pen Needle USE ONE PER DAY WITH VICTOZA   esomeprazole 40 MG capsule Commonly known as:  NEXIUM TAKE ONE CAPSULE BY MOUTH DAILY AT NOON   Fish Oil 1200 MG Caps Take 1,200 mg by mouth daily.   gabapentin 300 MG capsule Commonly known as:   NEURONTIN Take 1 capsule (300 mg  total) by mouth 2 (two) times daily.   glimepiride 2 MG tablet Commonly known as:  AMARYL TAKE 2 TABLETS(4 MG) BY MOUTH DAILY BEFORE DINNER   HYDROcodone-acetaminophen 5-325 MG tablet Commonly known as:  NORCO/VICODIN Take 1 tablet by mouth every 6 (six) hours as needed for moderate pain.   Insulin Glargine 300 UNIT/ML Sopn Commonly known as:  TOUJEO SOLOSTAR Inject 16 Units into the skin daily.   levothyroxine 137 MCG tablet Commonly known as:  SYNTHROID, LEVOTHROID TAKE 1 TABLET(137 MCG) BY MOUTH DAILY   metFORMIN 500 MG 24 hr tablet Commonly known as:  GLUCOPHAGE-XR Take 2 tablets (1,000 mg total) by mouth daily with supper.   MULTIVITAMIN ADULT Tabs Take 1 tablet by mouth every other day.   naproxen sodium 220 MG tablet Commonly known as:  ALEVE Take 440 mg by mouth daily as needed (pain).   tamsulosin 0.4 MG Caps capsule Commonly known as:  FLOMAX TAKE ONE CAPSULE BY MOUTH DAILY   VICTOZA 18 MG/3ML Sopn Generic drug:  liraglutide ADMINISTER 1.2 MG UNDER THE SKIN DAILY       Allergies: No Known Allergies  Past Medical History:  Diagnosis Date  . Barrett's esophagus   . Bilateral inguinal hernia   . Cardiomegaly 02/21/2017   noted on CXR  . Cholelithiasis   . Chronic kidney disease    stage 3 Goes to [place] [facility]  . Diabetes mellitus without complication ([place])    type 2  . Diverticulosis   . Duodenitis   . GERD (gastroesophageal reflux disease)   . Hiatal hernia   . History of blood transfusion 05/2016  . History of GI bleed   . History of hyperkalemia 02/2017  . History of kidney stones    35 years ago  . Hyperlipemia   . Hypertension   . Hypogonadism in male   . Hypothyroidism   . Ileus following gastrointestinal surgery ([place]) 06/12/2016  . Skin cancer    right arm treated by dermatologist  . Syncope 06/03/2016  . Umbilical hernia    Small  . Ventral hernia    Anterior    Past Surgical History:    Procedure Laterality Date  . COLONOSCOPY    . ESOPHAGOGASTRODUODENOSCOPY    . INCISIONAL HERNIA REPAIR N/A 08/01/2017   Procedure: OPEN REPAIR INICISIONAL HERNIA;  Surgeon: Ralene Ok, MD;  Location: [facility];  Service: General;  Laterality: N/A;  TAP BLOCK  . INSERTION OF MESH N/A 08/01/2017   Procedure: INSERTION OF MESH;  Surgeon: Ralene Ok, MD;  Location: [facility];  Service: General;  Laterality: N/A;  . IR GENERIC HISTORICAL  05/28/2016   IR ANGIOGRAM SELECTIVE EACH ADDITIONAL VESSEL 05/28/2016 Aletta Edouard, MD [facility]  . IR GENERIC HISTORICAL  05/28/2016   IR ANGIOGRAM VISCERAL SELECTIVE 05/28/2016 Aletta Edouard, MD [facility]  . IR GENERIC HISTORICAL  05/28/2016   IR US GUIDE VASC ACCESS RIGHT 05/28/2016 Aletta Edouard, MD [facility]  . IR GENERIC HISTORICAL  05/28/2016   IR ANGIOGRAM VISCERAL SELECTIVE 05/28/2016 Arne Cleveland, MD [facility]  . IR GENERIC HISTORICAL  05/28/2016   IR ANGIOGRAM SELECTIVE EACH ADDITIONAL VESSEL 05/28/2016 Arne Cleveland, MD [facility]  . IR GENERIC HISTORICAL  05/28/2016   IR EMBO ART  VEN HEMORR LYMPH EXTRAV  INC GUIDE ROADMAPPING 05/28/2016 Arne Cleveland, MD [facility]  . IR GENERIC HISTORICAL  05/28/2016   IR US GUIDE VASC ACCESS RIGHT 05/28/2016 Arne Cleveland, MD [facility]  . PARTIAL COLECTOMY N/A 06/06/2016  Procedure: OPEN ASCENDING COLECTOMY;  Surgeon: Stark Klein, MD;  Location: [facility];  Service: General;  Laterality: N/A;  . VASECTOMY      Family History  Problem Relation Age of Onset  . Heart disease Mother   . Diabetes Mother   . Cancer Father   . Heart attack Son     Social History:  reports that he has never smoked. He has never used smokeless tobacco. He reports that he does not drink alcohol or use drugs.    Review of Systems   The following information was adapted from previous notes and updated and modified for up-to-date information and history   HYPERTENSION: Followed by PCP Has been treated  with 5 mg amlodipine   BP Readings from Last 3 Encounters:  08/18/17 128/70  08/03/17 138/79  07/30/17 (!) 150/70   RENAL dysfunction   He has seen a nephrologist, renal function fluctuating Potassium tends to be high normal  Lab Results  Component Value Date   CREATININE 1.68 (H) 08/13/2017   CREATININE 1.21 08/03/2017   CREATININE 1.50 (H) 08/02/2017     Lab Results  Component Value Date   CREATININE 1.68 (H) 08/13/2017   BUN 29 (H) 08/13/2017   NA 136 08/13/2017   K 5.0 08/13/2017   CL 102 08/13/2017   CO2 26 08/13/2017          Lipids: Most recent labs as below, taking Lipitor 10 mg Recent labs:       Lab Results  Component Value Date   CHOL 154 05/12/2017   HDL 40 05/12/2017   LDLCALC 89 05/12/2017   LDLDIRECT 125.0 12/14/2014   TRIG 127 05/12/2017   CHOLHDL 3.9 05/12/2017                  Thyroid:  He   was found to have hypothyroidism about the year 2000 on routine labs without symptoms. Apparently he did not  feel any different with taking the thyroid supplement.   His TSH had been previously high but more recently has been lower and because of his TSH being 0.37 he is now taking 137 mcg levothyroxine instead of 150  TSH is still not as low now   Labs as follows:  Lab Results  Component Value Date   TSH 1.41 08/13/2017   TSH 0.37 07/24/2017   TSH 3.06 06/13/2017   FREET4 1.46 08/13/2017   FREET4 0.85 06/13/2017   FREET4 0.75 05/05/2017       He has been told to have hypogonadism of unclear etiology in the past He has been  reluctant to go back on AndroGel as he does not think he has enough symptoms   Lab Results  Component Value Date   TESTOSTERONE 174.72 (L) 12/14/2014     LABS:  Lab on 08/13/2017  Component Date Value Ref Range Status  . Free T4 08/13/2017 1.46  0.60 - 1.60 ng/dL Final   Comment: Specimens from patients who are undergoing biotin therapy and /or ingesting biotin supplements may contain high levels of biotin.   The higher biotin concentration in these specimens interferes with this Free T4 assay.  Specimens that contain high levels  of biotin may cause false high results for this Free T4 assay.  Please interpret results in light of the total clinical presentation of the patient.    Marland Kitchen TSH 08/13/2017 1.41  0.35 - 4.50 uIU/mL Final  . Sodium 08/13/2017 136  135 - 145 mEq/L Final  . Potassium 08/13/2017  5.0  3.5 - 5.1 mEq/L Final  . Chloride 08/13/2017 102  96 - 112 mEq/L Final  . CO2 08/13/2017 26  19 - 32 mEq/L Final  . Glucose, Bld 08/13/2017 266* 70 - 99 mg/dL Final  . BUN 08/13/2017 29* 6 - 23 mg/dL Final  . Creatinine, Ser 08/13/2017 1.68* 0.40 - 1.50 mg/dL Final  . Total Bilirubin 08/13/2017 0.5  0.2 - 1.2 mg/dL Final  . Alkaline Phosphatase 08/13/2017 66  39 - 117 U/L Final  . AST 08/13/2017 13  0 - 37 U/L Final  . ALT 08/13/2017 8  0 - 53 U/L Final  . Total Protein 08/13/2017 6.9  6.0 - 8.3 g/dL Final  . Albumin 08/13/2017 3.5  3.5 - 5.2 g/dL Final  . Calcium 08/13/2017 9.5  8.4 - 10.5 mg/dL Final  . GFR 08/13/2017 41.68* >60.00 mL/min Final  . Hgb A1c MFr Bld 08/13/2017 7.4* 4.6 - 6.5 % Final   Glycemic Control Guidelines for People with Diabetes:Non Diabetic:  <6%Goal of Therapy: <7%Additional Action Suggested:  >8%     Physical Examination:  BP 128/70 (BP Location: Left Arm, Patient Position: Sitting, Cuff Size: Normal)   Pulse 84   Ht '5\' 4"'$  (1.626 m)   Wt 171 lb (77.6 kg)   SpO2 97%   BMI 29.35 kg/m    ASSESSMENT:  Diabetes type 2, insulin requiring  See history of present illness for detailed discussion of current diabetes management, blood sugar patterns and problems identified  His A1c is 7.4 which is still adequate for his age and comorbid conditions  Currently on basal insulin, Victoza, metformin and Amaryl  Has difficulty with consistent diet and controlling carbohydrates especially snacks during the day Although he has relatively high postprandial readings these  are not consistent and during the day is not eating bland meals  Although he should ideally be on mealtime insulin with his main meal he has inconsistent readings He thinks he can try to improve his diet further For now we will continue him on Victoza along with the basal insulin    Hypothyroidism: TSH is more normal with 137 levothyroxine and he will continue this  Foot exam: Decreased pedal pulses but no symptoms of claudication   PLAN:  He will continue 20 units of Tresiba Less frequent intake of high carbohydrate foods like fruits and bread Reduce oranges because of tendency to high normal potassium   A1c in 3 months again  Patient Instructions  Cut back on oranges  More protein and less Carbs for snacks      Elayne Snare 08/18/2017, 3:56 PM   Note: This office note was prepared with Dragon voice recognition system technology. Any transcriptional errors that result from this process are unintentional.

## 2017-08-18 NOTE — Patient Instructions (Signed)
Cut back on oranges  More protein and less Carbs for snacks

## 2017-08-20 ENCOUNTER — Other Ambulatory Visit: Payer: Self-pay | Admitting: Endocrinology

## 2017-09-10 ENCOUNTER — Other Ambulatory Visit: Payer: Self-pay | Admitting: Family Medicine

## 2017-09-25 ENCOUNTER — Other Ambulatory Visit: Payer: Self-pay | Admitting: Endocrinology

## 2017-09-29 ENCOUNTER — Other Ambulatory Visit: Payer: Self-pay

## 2017-10-01 ENCOUNTER — Ambulatory Visit: Payer: Self-pay | Admitting: Endocrinology

## 2017-11-13 ENCOUNTER — Other Ambulatory Visit: Payer: Self-pay | Admitting: Family Medicine

## 2017-11-13 ENCOUNTER — Other Ambulatory Visit: Payer: Self-pay | Admitting: Endocrinology

## 2017-11-13 DIAGNOSIS — E785 Hyperlipidemia, unspecified: Secondary | ICD-10-CM

## 2017-11-13 NOTE — Telephone Encounter (Signed)
Amlodipine 5 mg refill Last Refill:07/14/17 # 30 Last OV: 04/14/17 PCP: Carlota Raspberry Pharmacy:[facility] [id number]  Lipitor 10 mg refill Last Refill:07/07/17 # 90 Last OV: 04/14/17 PCP: Carlota Raspberry Pharmacy:[facility] [phone]  Returned - needs appt.   The amlodipine has been refused several times.

## 2017-11-22 ENCOUNTER — Other Ambulatory Visit: Payer: Self-pay

## 2017-11-22 ENCOUNTER — Emergency Department (HOSPITAL_COMMUNITY): Payer: Medicare Other

## 2017-11-22 ENCOUNTER — Encounter (HOSPITAL_COMMUNITY): Payer: Self-pay

## 2017-11-22 ENCOUNTER — Inpatient Hospital Stay (HOSPITAL_COMMUNITY)
Admission: EM | Admit: 2017-11-22 | Discharge: 2017-12-01 | DRG: 436 | Disposition: A | Discharge status: Hospice | Payer: Medicare Other | Attending: Internal Medicine | Admitting: Internal Medicine

## 2017-11-22 DIAGNOSIS — E440 Moderate protein-calorie malnutrition: Secondary | ICD-10-CM | POA: Diagnosis present

## 2017-11-22 DIAGNOSIS — R945 Abnormal results of liver function studies: Secondary | ICD-10-CM

## 2017-11-22 DIAGNOSIS — E118 Type 2 diabetes mellitus with unspecified complications: Secondary | ICD-10-CM | POA: Diagnosis not present

## 2017-11-22 DIAGNOSIS — Z8249 Family history of ischemic heart disease and other diseases of the circulatory system: Secondary | ICD-10-CM

## 2017-11-22 DIAGNOSIS — E119 Type 2 diabetes mellitus without complications: Secondary | ICD-10-CM

## 2017-11-22 DIAGNOSIS — R52 Pain, unspecified: Secondary | ICD-10-CM

## 2017-11-22 DIAGNOSIS — I129 Hypertensive chronic kidney disease with stage 1 through stage 4 chronic kidney disease, or unspecified chronic kidney disease: Secondary | ICD-10-CM | POA: Diagnosis present

## 2017-11-22 DIAGNOSIS — Z66 Do not resuscitate: Secondary | ICD-10-CM | POA: Diagnosis present

## 2017-11-22 DIAGNOSIS — K869 Disease of pancreas, unspecified: Secondary | ICD-10-CM | POA: Diagnosis not present

## 2017-11-22 DIAGNOSIS — E876 Hypokalemia: Secondary | ICD-10-CM | POA: Diagnosis present

## 2017-11-22 DIAGNOSIS — I10 Essential (primary) hypertension: Secondary | ICD-10-CM | POA: Diagnosis not present

## 2017-11-22 DIAGNOSIS — R7401 Elevation of levels of liver transaminase levels: Secondary | ICD-10-CM

## 2017-11-22 DIAGNOSIS — D689 Coagulation defect, unspecified: Secondary | ICD-10-CM | POA: Diagnosis present

## 2017-11-22 DIAGNOSIS — N400 Enlarged prostate without lower urinary tract symptoms: Secondary | ICD-10-CM | POA: Diagnosis present

## 2017-11-22 DIAGNOSIS — R0600 Dyspnea, unspecified: Secondary | ICD-10-CM

## 2017-11-22 DIAGNOSIS — Z809 Family history of malignant neoplasm, unspecified: Secondary | ICD-10-CM

## 2017-11-22 DIAGNOSIS — E1142 Type 2 diabetes mellitus with diabetic polyneuropathy: Secondary | ICD-10-CM | POA: Diagnosis present

## 2017-11-22 DIAGNOSIS — Z515 Encounter for palliative care: Secondary | ICD-10-CM

## 2017-11-22 DIAGNOSIS — C250 Malignant neoplasm of head of pancreas: Principal | ICD-10-CM

## 2017-11-22 DIAGNOSIS — Y92231 Patient bathroom in hospital as the place of occurrence of the external cause: Secondary | ICD-10-CM | POA: Diagnosis present

## 2017-11-22 DIAGNOSIS — E11649 Type 2 diabetes mellitus with hypoglycemia without coma: Secondary | ICD-10-CM | POA: Diagnosis not present

## 2017-11-22 DIAGNOSIS — E291 Testicular hypofunction: Secondary | ICD-10-CM | POA: Diagnosis present

## 2017-11-22 DIAGNOSIS — Z87442 Personal history of urinary calculi: Secondary | ICD-10-CM

## 2017-11-22 DIAGNOSIS — R740 Nonspecific elevation of levels of transaminase and lactic acid dehydrogenase [LDH]: Secondary | ICD-10-CM

## 2017-11-22 DIAGNOSIS — G253 Myoclonus: Secondary | ICD-10-CM | POA: Diagnosis not present

## 2017-11-22 DIAGNOSIS — Z85828 Personal history of other malignant neoplasm of skin: Secondary | ICD-10-CM

## 2017-11-22 DIAGNOSIS — R748 Abnormal levels of other serum enzymes: Secondary | ICD-10-CM | POA: Diagnosis not present

## 2017-11-22 DIAGNOSIS — Z79899 Other long term (current) drug therapy: Secondary | ICD-10-CM

## 2017-11-22 DIAGNOSIS — E1122 Type 2 diabetes mellitus with diabetic chronic kidney disease: Secondary | ICD-10-CM | POA: Diagnosis present

## 2017-11-22 DIAGNOSIS — Z79890 Hormone replacement therapy (postmenopausal): Secondary | ICD-10-CM

## 2017-11-22 DIAGNOSIS — C259 Malignant neoplasm of pancreas, unspecified: Secondary | ICD-10-CM | POA: Diagnosis present

## 2017-11-22 DIAGNOSIS — K2270 Barrett's esophagus without dysplasia: Secondary | ICD-10-CM | POA: Diagnosis present

## 2017-11-22 DIAGNOSIS — N183 Chronic kidney disease, stage 3 (moderate): Secondary | ICD-10-CM | POA: Diagnosis present

## 2017-11-22 DIAGNOSIS — Z8719 Personal history of other diseases of the digestive system: Secondary | ICD-10-CM

## 2017-11-22 DIAGNOSIS — Z9889 Other specified postprocedural states: Secondary | ICD-10-CM | POA: Diagnosis not present

## 2017-11-22 DIAGNOSIS — E785 Hyperlipidemia, unspecified: Secondary | ICD-10-CM | POA: Diagnosis present

## 2017-11-22 DIAGNOSIS — Z9049 Acquired absence of other specified parts of digestive tract: Secondary | ICD-10-CM

## 2017-11-22 DIAGNOSIS — N179 Acute kidney failure, unspecified: Secondary | ICD-10-CM | POA: Diagnosis present

## 2017-11-22 DIAGNOSIS — D631 Anemia in chronic kidney disease: Secondary | ICD-10-CM | POA: Diagnosis present

## 2017-11-22 DIAGNOSIS — R112 Nausea with vomiting, unspecified: Secondary | ICD-10-CM | POA: Diagnosis present

## 2017-11-22 DIAGNOSIS — K8021 Calculus of gallbladder without cholecystitis with obstruction: Secondary | ICD-10-CM | POA: Diagnosis present

## 2017-11-22 DIAGNOSIS — E039 Hypothyroidism, unspecified: Secondary | ICD-10-CM | POA: Diagnosis present

## 2017-11-22 DIAGNOSIS — E860 Dehydration: Secondary | ICD-10-CM

## 2017-11-22 DIAGNOSIS — Z7982 Long term (current) use of aspirin: Secondary | ICD-10-CM

## 2017-11-22 DIAGNOSIS — K8689 Other specified diseases of pancreas: Secondary | ICD-10-CM

## 2017-11-22 DIAGNOSIS — K831 Obstruction of bile duct: Secondary | ICD-10-CM | POA: Diagnosis not present

## 2017-11-22 DIAGNOSIS — Z794 Long term (current) use of insulin: Secondary | ICD-10-CM

## 2017-11-22 DIAGNOSIS — R17 Unspecified jaundice: Secondary | ICD-10-CM

## 2017-11-22 DIAGNOSIS — R634 Abnormal weight loss: Secondary | ICD-10-CM | POA: Diagnosis not present

## 2017-11-22 DIAGNOSIS — W19XXXA Unspecified fall, initial encounter: Secondary | ICD-10-CM | POA: Diagnosis not present

## 2017-11-22 DIAGNOSIS — E872 Acidosis: Secondary | ICD-10-CM | POA: Diagnosis present

## 2017-11-22 DIAGNOSIS — Z833 Family history of diabetes mellitus: Secondary | ICD-10-CM

## 2017-11-22 DIAGNOSIS — Z6825 Body mass index (BMI) 25.0-25.9, adult: Secondary | ICD-10-CM

## 2017-11-22 DIAGNOSIS — K219 Gastro-esophageal reflux disease without esophagitis: Secondary | ICD-10-CM | POA: Diagnosis present

## 2017-11-22 DIAGNOSIS — R7989 Other specified abnormal findings of blood chemistry: Secondary | ICD-10-CM

## 2017-11-22 DIAGNOSIS — R531 Weakness: Secondary | ICD-10-CM

## 2017-11-22 HISTORY — DX: Malignant neoplasm of pancreas, unspecified: C25.9

## 2017-11-22 LAB — CBC WITH DIFFERENTIAL/PLATELET
BASOS PCT: 1 %
Basophils Absolute: 0.1 10*3/uL (ref 0.0–0.1)
Eosinophils Absolute: 0.1 10*3/uL (ref 0.0–0.7)
Eosinophils Relative: 1 %
HEMATOCRIT: 36.1 % — AB (ref 39.0–52.0)
Hemoglobin: 11.9 g/dL — ABNORMAL LOW (ref 13.0–17.0)
Lymphocytes Relative: 19 %
Lymphs Abs: 1.3 10*3/uL (ref 0.7–4.0)
MCH: 28.1 pg (ref 26.0–34.0)
MCHC: 33 g/dL (ref 30.0–36.0)
MCV: 85.1 fL (ref 78.0–100.0)
MONO ABS: 0.4 10*3/uL (ref 0.1–1.0)
MONOS PCT: 5 %
NEUTROS ABS: 5.2 10*3/uL (ref 1.7–7.7)
Neutrophils Relative %: 74 %
Platelets: 233 10*3/uL (ref 150–400)
RBC: 4.24 MIL/uL (ref 4.22–5.81)
RDW: 18.2 % — AB (ref 11.5–15.5)
WBC: 7 10*3/uL (ref 4.0–10.5)

## 2017-11-22 LAB — COMPREHENSIVE METABOLIC PANEL
ALT: 571 U/L — ABNORMAL HIGH (ref 0–44)
ANION GAP: 15 (ref 5–15)
AST: 480 U/L — ABNORMAL HIGH (ref 15–41)
Albumin: 2.9 g/dL — ABNORMAL LOW (ref 3.5–5.0)
Alkaline Phosphatase: 2477 U/L — ABNORMAL HIGH (ref 38–126)
BILIRUBIN TOTAL: 36 mg/dL — AB (ref 0.3–1.2)
BUN: 55 mg/dL — ABNORMAL HIGH (ref 8–23)
CHLORIDE: 102 mmol/L (ref 98–111)
CO2: 17 mmol/L — ABNORMAL LOW (ref 22–32)
Calcium: 10 mg/dL (ref 8.9–10.3)
Creatinine, Ser: 2.73 mg/dL — ABNORMAL HIGH (ref 0.61–1.24)
GFR calc non Af Amer: 20 mL/min — ABNORMAL LOW (ref >60)
GFR, EST AFRICAN AMERICAN: 23 mL/min — AB (ref >60)
Glucose, Bld: 249 mg/dL — ABNORMAL HIGH (ref 70–99)
POTASSIUM: 3.6 mmol/L (ref 3.5–5.1)
Sodium: 134 mmol/L — ABNORMAL LOW (ref 135–145)
TOTAL PROTEIN: 7.5 g/dL (ref 6.5–8.1)

## 2017-11-22 LAB — PROTIME-INR
INR: 1.38
Prothrombin Time: 16.9 seconds — ABNORMAL HIGH (ref 11.4–15.2)

## 2017-11-22 LAB — LIPASE, BLOOD: LIPASE: 67 U/L — AB (ref 11–51)

## 2017-11-22 MED ORDER — SODIUM CHLORIDE 0.9 % IV BOLUS
1000.0000 mL | Freq: Once | INTRAVENOUS | Status: AC
  Administered 2017-11-22: 1000 mL via INTRAVENOUS

## 2017-11-22 MED ORDER — GABAPENTIN 300 MG PO CAPS
300.0000 mg | ORAL_CAPSULE | Freq: Two times a day (BID) | ORAL | Status: DC
  Administered 2017-11-23 – 2017-11-26 (×7): 300 mg via ORAL
  Filled 2017-11-22 (×8): qty 1

## 2017-11-22 MED ORDER — AMLODIPINE BESYLATE 5 MG PO TABS
5.0000 mg | ORAL_TABLET | Freq: Every day | ORAL | Status: DC
  Administered 2017-11-25 – 2017-11-26 (×2): 5 mg via ORAL
  Filled 2017-11-22 (×3): qty 1

## 2017-11-22 MED ORDER — ONDANSETRON HCL 4 MG PO TABS: 4.0000 mg | ORAL_TABLET | Freq: Four times a day (QID) | ORAL | Status: DC | PRN

## 2017-11-22 MED ORDER — HEPARIN SODIUM (PORCINE) 5000 UNIT/ML IJ SOLN
5000.0000 [IU] | Freq: Three times a day (TID) | INTRAMUSCULAR | Status: DC
  Administered 2017-11-23 – 2017-12-01 (×23): 5000 [IU] via SUBCUTANEOUS
  Filled 2017-11-22 (×23): qty 1

## 2017-11-22 MED ORDER — DEXTROSE-NACL 5-0.45 % IV SOLN
INTRAVENOUS | Status: DC
  Administered 2017-11-22 – 2017-11-24 (×5): via INTRAVENOUS

## 2017-11-22 MED ORDER — LEVOTHYROXINE SODIUM 25 MCG PO TABS
137.0000 ug | ORAL_TABLET | Freq: Every day | ORAL | Status: DC
  Administered 2017-11-25 – 2017-12-01 (×7): 137 ug via ORAL
  Filled 2017-11-22 (×8): qty 1

## 2017-11-22 MED ORDER — ONDANSETRON HCL 4 MG/2ML IJ SOLN: 4.0000 mg | Freq: Four times a day (QID) | INTRAMUSCULAR | Status: DC | PRN

## 2017-11-22 MED ORDER — INSULIN ASPART 100 UNIT/ML ~~LOC~~ SOLN
0.0000 [IU] | Freq: Three times a day (TID) | SUBCUTANEOUS | Status: DC
  Administered 2017-11-23: 8 [IU] via SUBCUTANEOUS
  Administered 2017-11-25 (×3): 3 [IU] via SUBCUTANEOUS
  Administered 2017-11-26 – 2017-11-27 (×2): 2 [IU] via SUBCUTANEOUS
  Administered 2017-11-27: 3 [IU] via SUBCUTANEOUS

## 2017-11-22 MED ORDER — TAMSULOSIN HCL 0.4 MG PO CAPS
0.4000 mg | ORAL_CAPSULE | Freq: Every day | ORAL | Status: DC
  Administered 2017-11-25 – 2017-12-01 (×7): 0.4 mg via ORAL
  Filled 2017-11-22 (×8): qty 1

## 2017-11-22 NOTE — ED Notes (Signed)
ED TO INPATIENT HANDOFF REPORT  Name/Age/Gender Zachary Cook 84 y.o. male  Code Status Code Status History    Date Active Date Inactive Code Status Order ID Comments User Context   08/01/2017 1050 08/03/2017 1414 Full Code [id number]  Ralene Ok, MD Inpatient   06/03/2016 1728 06/16/2016 1619 Full Code [id number]  Doreatha Lew, MD Inpatient   05/27/2016 2141 05/31/2016 1441 Full Code [id number]  Etta Quill, DO ED      Home/SNF/Other Home  Chief Complaint generalized weakness  Level of Care/Admitting Diagnosis ED Disposition    ED Disposition Condition [place]: [facility] [[id number]]  Level of Care: Med-Surg [16]  Diagnosis: Biliary obstruction [791505]  Admitting Physician: Eston Esters  Attending Physician: Gwynne Edinger [[id number]]  Estimated length of stay: past midnight tomorrow  Certification:: I certify this patient will need inpatient services for at least 2 midnights  PT Class (Do Not Modify): Inpatient [101]  PT Acc Code (Do Not Modify): Private [1]       Medical History Past Medical History:  Diagnosis Date  . Barrett's esophagus   . Bilateral inguinal hernia   . Cardiomegaly 02/21/2017   noted on CXR  . Cholelithiasis   . Chronic kidney disease    stage 3 Goes to [place] [facility]  . Diabetes mellitus without complication ([place])    type 2  . Diverticulosis   . Duodenitis   . GERD (gastroesophageal reflux disease)   . Hiatal hernia   . History of blood transfusion 05/2016  . History of GI bleed   . History of hyperkalemia 02/2017  . History of kidney stones    35 years ago  . Hyperlipemia   . Hypertension   . Hypogonadism in male   . Hypothyroidism   . Ileus following gastrointestinal surgery ([place]) 06/12/2016  . Skin cancer    right arm treated by dermatologist  . Syncope 06/03/2016  . Umbilical hernia    Small  . Ventral hernia    Anterior    Allergies No Known  Allergies  IV Location/Drains/Wounds Patient Lines/Drains/Airways Status   Active Line/Drains/Airways    Name:   Placement date:   Placement time:   Site:   Days:   Peripheral IV 11/22/17 Right Antecubital   11/22/17    1905    Antecubital   less than 1          Labs/Imaging Results for orders placed or performed during the hospital encounter of 11/22/17 (from the past 48 hour(s))  Lipase, blood     Status: Abnormal   Collection Time: 11/22/17  7:08 PM  Result Value Ref Range   Lipase 67 (H) 11 - 51 U/L    Comment: Performed at [facility], [place] [address] [place], [place] [address]  Comprehensive metabolic panel     Status: Abnormal   Collection Time: 11/22/17  7:08 PM  Result Value Ref Range   Sodium 134 (L) 135 - 145 mmol/L   Potassium 3.6 3.5 - 5.1 mmol/L   Chloride 102 98 - 111 mmol/L   CO2 17 (L) 22 - 32 mmol/L   Glucose, Bld 249 (H) 70 - 99 mg/dL   BUN 55 (H) 8 - 23 mg/dL   Creatinine, Ser 2.73 (H) 0.61 - 1.24 mg/dL   Calcium 10.0 8.9 - 10.3 mg/dL   Total Protein 7.5 6.5 - 8.1 g/dL   Albumin 2.9 (L) 3.5 - 5.0 g/dL  AST 480 (H) 15 - 41 U/L   ALT 571 (H) 0 - 44 U/L   Alkaline Phosphatase 2,477 (H) 38 - 126 U/L    Comment: RESULTS CONFIRMED BY MANUAL DILUTION   Total Bilirubin 36.0 (HH) 0.3 - 1.2 mg/dL    Comment: RESULTS CONFIRMED BY MANUAL DILUTION CRITICAL RESULT CALLED TO, READ BACK BY AND VERIFIED WITH: H,Aaidyn San AT 2053 ON 11/22/17 BY A,MOHAMED    GFR calc non Af Amer 20 (L) >60 mL/min   GFR calc Af Amer 23 (L) >60 mL/min    Comment: (NOTE) The eGFR has been calculated using the CKD EPI equation. This calculation has not been validated in all clinical situations. eGFR's persistently <60 mL/min signify possible Chronic Kidney Disease.    Anion gap 15 5 - 15    Comment: Performed at [facility], [place] [address] [place], [place] [address]  CBC with Differential     Status: Abnormal   Collection Time: 11/22/17  7:08 PM   Result Value Ref Range   WBC 7.0 4.0 - 10.5 K/uL   RBC 4.24 4.22 - 5.81 MIL/uL   Hemoglobin 11.9 (L) 13.0 - 17.0 g/dL   HCT 36.1 (L) 39.0 - 52.0 %   MCV 85.1 78.0 - 100.0 fL   MCH 28.1 26.0 - 34.0 pg   MCHC 33.0 30.0 - 36.0 g/dL   RDW 18.2 (H) 11.5 - 15.5 %   Platelets 233 150 - 400 K/uL   Neutrophils Relative % 74 %   Neutro Abs 5.2 1.7 - 7.7 K/uL   Lymphocytes Relative 19 %   Lymphs Abs 1.3 0.7 - 4.0 K/uL   Monocytes Relative 5 %   Monocytes Absolute 0.4 0.1 - 1.0 K/uL   Eosinophils Relative 1 %   Eosinophils Absolute 0.1 0.0 - 0.7 K/uL   Basophils Relative 1 %   Basophils Absolute 0.1 0.0 - 0.1 K/uL    Comment: Performed at [facility], [place] [address], [place], [place] [address]  Protime-INR     Status: Abnormal   Collection Time: 11/22/17  7:08 PM  Result Value Ref Range   Prothrombin Time 16.9 (H) 11.4 - 15.2 seconds   INR 1.38     Comment: Performed at [facility], [place] [address], [place], [place] [address]   US Abdomen Complete  Result Date: 11/22/2017 CLINICAL DATA:  Jaundice with weight loss over 4 weeks. EXAM: ABDOMEN ULTRASOUND COMPLETE COMPARISON:  CT 02/13/2017 FINDINGS: Gallbladder: Contracted gallbladder full of stones. Negative sonographic Murphy sign. Wall thickness is 1.6 mm. No adjacent pericholecystic fluid. Largest stone 2.1 cm. Common bile duct: Diameter: 8.9 mm Liver: Increased parenchymal echogenicity without focal mass. Mild central intrahepatic ductal dilatation. Portal vein is patent on color Doppler imaging with normal direction of blood flow towards the liver. IVC: No abnormality visualized. Pancreas: Not visualized. Spleen: Size and appearance within normal limits. Right Kidney: Length: 11.2 cm. Echogenicity within normal limits. 1 cm cyst over the mid pole. No mass or hydronephrosis visualized. Left Kidney: Length: 10.0 cm 1.1 cm cyst over the mid pole. Echogenicity within normal limits. No mass or hydronephrosis  visualized. Abdominal aorta: No aneurysm visualized. Other findings: None. IMPRESSION: Contracted gallbladder with moderate cholelithiasis. No additional sonographic to suggest cholecystitis. Mild dilatation of the common bile duct. MRCP may be helpful in this patient with jaundice. Findings compatible with mild hepatic steatosis without focal mass. Small bilateral renal cysts. Electronically Signed   By: Marin Olp M.D.  On: 11/22/2017 21:16    Pending Labs Unresulted Labs (From admission, onward)    Start     Ordered   11/22/17 2050  Hepatitis panel, acute  STAT,   STAT     11/22/17 2049   11/22/17 1852  Urinalysis, Routine w reflex microscopic  STAT,   STAT     11/22/17 1852   Signed and Held  CBC  (heparin)  Once,   R    Comments:  Baseline for heparin therapy IF NOT ALREADY DRAWN.  Notify MD if PLT < 100 K.    Signed and Held   Signed and Held  Creatinine, serum  (heparin)  Once,   R    Comments:  Baseline for heparin therapy IF NOT ALREADY DRAWN.    Signed and Held   Signed and Held  Comprehensive metabolic panel  Tomorrow morning,   R     Signed and Held   Signed and Held  Acetaminophen level  Once,   R     Signed and Held   Signed and Held  Rapid urine drug screen (hospital performed)  STAT,   R     Signed and Held   Signed and Held  [employer]  Once,   [employer] and [employer] and Held  [employer]  Once,   R     Signed and Held   Signed and Held  HIV Antibody (routine testing w rflx)  Once,   R     Signed and Held   Signed and Held  TSH  Once,   R    Question:  Specimen collection method  Answer:  Unit=Unit collect   Signed and Held   Signed and Held  Comprehensive metabolic panel  Tomorrow morning,   STAT    Question:  Specimen collection method  Answer:  Unit=Unit collect   Signed and Held          Vitals/Pain Today's Vitals   11/22/17 2100 11/22/17 2130 11/22/17 2200 11/22/17 2300  BP: 126/73 117/65 123/68 (!) 141/70  Pulse: 72 65 67 65  Resp: '16 19 16 '$ (!)  24  Temp:      TempSrc:      SpO2: 96% 97% 97% 95%  Weight:      Height:      PainSc:        Isolation Precautions No active isolations  Medications Medications  sodium chloride 0.9 % bolus 1,000 mL (1,000 mLs Intravenous New Bag/Given 11/22/17 2123)    Mobility walks

## 2017-11-22 NOTE — H&P (Addendum)
History and Physical    Zachary Cook XHB:[id number] DOB: 12-Nov-1932 DOA: 11/22/2017  PCP: Zachary Agreste, MD  Patient coming from: home   Chief Complaint: jaundice and nausea/vomiting  HPI: Zachary Cook is a 84 y.o. male with medical history significant for hypothyroid, htn, gi bleed (diverticulosis, s/p partial colectomy), incisional hernia repair 07/2017, T2DM, who presents with above.  Symptoms began 5 wks ago and have progressively worsened. Began feeling fatigued, then at times dizzy when ambulating. Then developed nausea, worse with eating, with vomiting after most meals. PO intake decreased, says has lost 30-40 pounds. Began to develop jaundice about a week ago. Says was evaluated by PCP early in this course and says he thinks pcp wasn't much concerned, sounds like symptoms mild at that time. Also had an episode of white/tan stools that have resolved. No fevers. No abdominal pain. No diarrhea, no blood in stool. No new meds. Not a drinker. No history liver or gallbladder problems. No history cancer.  ED Course: u/s, labs, 1 l ns  Review of Systems: As per HPI otherwise 10 point review of systems negative.    Past Medical History:  Diagnosis Date  . Barrett's esophagus   . Bilateral inguinal hernia   . Cardiomegaly 02/21/2017   noted on CXR  . Cholelithiasis   . Chronic kidney disease    stage 3 Goes to [place] [facility]  . Diabetes mellitus without complication ([place])    type 2  . Diverticulosis   . Duodenitis   . GERD (gastroesophageal reflux disease)   . Hiatal hernia   . History of blood transfusion 05/2016  . History of GI bleed   . History of hyperkalemia 02/2017  . History of kidney stones    35 years ago  . Hyperlipemia   . Hypertension   . Hypogonadism in male   . Hypothyroidism   . Ileus following gastrointestinal surgery ([place]) 06/12/2016  . Skin cancer    right arm treated by dermatologist  . Syncope 06/03/2016  . Umbilical hernia    Small    . Ventral hernia    Anterior    Past Surgical History:  Procedure Laterality Date  . COLONOSCOPY    . ESOPHAGOGASTRODUODENOSCOPY    . INCISIONAL HERNIA REPAIR N/A 08/01/2017   Procedure: OPEN REPAIR INICISIONAL HERNIA;  Surgeon: Zachary Ok, MD;  Location: [facility];  Service: General;  Laterality: N/A;  TAP BLOCK  . INSERTION OF MESH N/A 08/01/2017   Procedure: INSERTION OF MESH;  Surgeon: Zachary Ok, MD;  Location: [facility];  Service: General;  Laterality: N/A;  . IR GENERIC HISTORICAL  05/28/2016   IR ANGIOGRAM SELECTIVE EACH ADDITIONAL VESSEL 05/28/2016 Zachary Edouard, MD [facility]  . IR GENERIC HISTORICAL  05/28/2016   IR ANGIOGRAM VISCERAL SELECTIVE 05/28/2016 Zachary Edouard, MD [facility]  . IR GENERIC HISTORICAL  05/28/2016   IR US GUIDE VASC ACCESS RIGHT 05/28/2016 Zachary Edouard, MD [facility]  . IR GENERIC HISTORICAL  05/28/2016   IR ANGIOGRAM VISCERAL SELECTIVE 05/28/2016 Zachary Cleveland, MD [facility]  . IR GENERIC HISTORICAL  05/28/2016   IR ANGIOGRAM SELECTIVE EACH ADDITIONAL VESSEL 05/28/2016 Zachary Cleveland, MD [facility]  . IR GENERIC HISTORICAL  05/28/2016   IR EMBO ART  VEN HEMORR LYMPH EXTRAV  INC GUIDE ROADMAPPING 05/28/2016 Zachary Cleveland, MD [facility]  . IR GENERIC HISTORICAL  05/28/2016   IR US GUIDE VASC ACCESS RIGHT 05/28/2016 Zachary Cleveland, MD [facility]  . PARTIAL COLECTOMY N/A 06/06/2016  Procedure: OPEN ASCENDING COLECTOMY;  Surgeon: Zachary Klein, MD;  Location: [facility];  Service: General;  Laterality: N/A;  . VASECTOMY       reports that he has never smoked. He has never used smokeless tobacco. He reports that he does not drink alcohol or use drugs.  No Known Allergies  Family History  Problem Relation Age of Onset  . Heart disease Mother   . Diabetes Mother   . Cancer Father   . Heart attack Son     Prior to Admission medications   Medication Sig Start Date End Date Taking? Authorizing Provider  ACCU-CHEK SMARTVIEW test strip  USE TO TEST TWICE DAILY Patient taking differently: 1 each by Other route 2 (two) times daily.  04/07/17  Yes Zachary Snare, MD  amLODipine (NORVASC) 5 MG tablet TAKE 1 TABLET(5 MG) BY MOUTH DAILY Patient taking differently: Take 5 mg by mouth daily.  07/14/17  Yes Zachary Agreste, MD  aspirin EC 81 MG tablet Take 81 mg by mouth daily.   Yes [provider]  atorvastatin (LIPITOR) 10 MG tablet TAKE 1 TABLET(10 MG) BY MOUTH DAILY AT 6 PM Patient taking differently: Take 10 mg by mouth daily at 6 PM.  07/07/17  Yes Zachary Agreste, MD  B-D UF III MINI PEN NEEDLES 31G X 5 MM MISC USE ONE PER DAY WITH VICTOZA Patient taking differently: Inject 1 each into the skin daily.  09/25/17  Yes Zachary Snare, MD  esomeprazole (NEXIUM) 40 MG capsule TAKE ONE CAPSULE BY MOUTH DAILY AT NOON Patient taking differently: Take 40 mg by mouth daily at 12 noon.  08/12/17  Yes Zachary Agreste, MD  glimepiride (AMARYL) 2 MG tablet TAKE 2 TABLETS(4 MG) BY MOUTH DAILY BEFORE DINNER Patient taking differently: Take 4 mg by mouth daily with breakfast.  08/20/17  Yes Zachary Snare, MD  Insulin Glargine (TOUJEO SOLOSTAR) 300 UNIT/ML SOPN Inject 16 Units into the skin daily. Patient taking differently: Inject 20 Units into the skin every evening.  07/07/17  Yes Zachary Snare, MD  levothyroxine (SYNTHROID, LEVOTHROID) 137 MCG tablet TAKE 1 TABLET(137 MCG) BY MOUTH DAILY Patient taking differently: Take 137 mcg by mouth daily before breakfast.  08/14/17  Yes Zachary Snare, MD  metFORMIN (GLUCOPHAGE-XR) 500 MG 24 hr tablet Take 2 tablets (1,000 mg total) by mouth daily with supper. 05/09/17  Yes Zachary Snare, MD  tamsulosin (FLOMAX) 0.4 MG CAPS capsule TAKE 1 CAPSULE BY MOUTH DAILY Patient taking differently: Take 0.4 mg by mouth daily.  09/10/17  Yes Zachary Agreste, MD  VICTOZA 18 MG/3ML SOPN ADMINISTER 1.2 MG UNDER THE SKIN DAILY Patient taking differently: Inject 1.2 mg into the skin daily. ADMINISTER 1.2 MG UNDER THE SKIN DAILY  11/13/17  Yes Zachary Snare, MD  gabapentin (NEURONTIN) 300 MG capsule Take 1 capsule (300 mg total) by mouth 2 (two) times daily. Patient not taking: Reported on 11/22/2017 08/03/17   Zachary Overall, MD  HYDROcodone-acetaminophen (NORCO/VICODIN) 5-325 MG tablet Take 1 tablet by mouth every 6 (six) hours as needed for moderate pain. Patient not taking: Reported on 11/22/2017 08/03/17   Zachary Overall, MD    Physical Exam: Vitals:   11/22/17 2045 11/22/17 2100 11/22/17 2130 11/22/17 2200  BP: 130/71 126/73 117/65 123/68  Pulse: 75 72 65 67  Resp: '17 16 19 16  '$ Temp: 97.7 F (36.5 C)     TempSrc: Oral     SpO2: 97% 96% 97% 97%  Weight:  Height:        Constitutional: No acute distress Head: Atraumatic Eyes: Conjunctiva icteric ENM: Moist mucous membranes. Normal dentition.  Neck: Supple Respiratory: Clear to auscultation bilaterally, no wheezing/rales/rhonchi. Normal respiratory effort. No accessory muscle use. . Cardiovascular: Regular rate and rhythm. No murmurs/rubs/gallops. Abdomen: Non-tender, non-distended. No masses. No rebound or guarding. Positive bowel sounds. Midline surgical scar Musculoskeletal: No joint deformity upper and lower extremities. Normal ROM, no contractures. Normal muscle tone.  Skin: No rashes, lesions, or ulcers. Jaundice from head to toe. Extremities: No peripheral edema. Palpable peripheral pulses. Neurologic: Alert, moving all 4 extremities. Psychiatric: Normal insight and judgement.   Labs on Admission: I have personally reviewed following labs and imaging studies  CBC: Recent Labs  Lab 11/22/17 1908  WBC 7.0  NEUTROABS 5.2  HGB 11.9*  HCT 36.1*  MCV 85.1  PLT 628   Basic Metabolic Panel: Recent Labs  Lab 11/22/17 1908  NA 134*  K 3.6  CL 102  CO2 17*  GLUCOSE 249*  BUN 55*  CREATININE 2.73*  CALCIUM 10.0   GFR: Estimated Creatinine Clearance: 17.2 mL/min (A) (by C-G formula based on SCr of 2.73 mg/dL (H)). Liver Function  Tests: Recent Labs  Lab 11/22/17 1908  AST 480*  ALT 571*  ALKPHOS 2,477*  BILITOT 36.0*  PROT 7.5  ALBUMIN 2.9*   Recent Labs  Lab 11/22/17 1908  LIPASE 67*   No results for input(s): AMMONIA in the last 168 hours. Coagulation Profile: Recent Labs  Lab 11/22/17 1908  INR 1.38   Cardiac Enzymes: No results for input(s): CKTOTAL, CKMB, CKMBINDEX, TROPONINI in the last 168 hours. BNP (last 3 results) No results for input(s): PROBNP in the last 8760 hours. HbA1C: No results for input(s): HGBA1C in the last 72 hours. CBG: No results for input(s): GLUCAP in the last 168 hours. Lipid Profile: No results for input(s): CHOL, HDL, LDLCALC, TRIG, CHOLHDL, LDLDIRECT in the last 72 hours. Thyroid Function Tests: No results for input(s): TSH, T4TOTAL, FREET4, T3FREE, THYROIDAB in the last 72 hours. Anemia Panel: No results for input(s): VITAMINB12, FOLATE, FERRITIN, TIBC, IRON, RETICCTPCT in the last 72 hours. Urine analysis:    Component Value Date/Time   COLORURINE YELLOW 07/24/2017 [place] 07/24/2017 0937   LABSPEC 1.025 07/24/2017 0937   PHURINE 5.5 07/24/2017 0937   GLUCOSEU NEGATIVE 07/24/2017 0937   HGBUR SMALL (A) 07/24/2017 0937   BILIRUBINUR NEGATIVE 07/24/2017 0937   KETONESUR TRACE (A) 07/24/2017 0937   UROBILINOGEN 0.2 07/24/2017 0937   NITRITE NEGATIVE 07/24/2017 0937   LEUKOCYTESUR NEGATIVE 07/24/2017 0937    Radiological Exams on Admission: US Abdomen Complete  Result Date: 11/22/2017 CLINICAL DATA:  Jaundice with weight loss over 4 weeks. EXAM: ABDOMEN ULTRASOUND COMPLETE COMPARISON:  CT 02/13/2017 FINDINGS: Gallbladder: Contracted gallbladder full of stones. Negative sonographic Murphy sign. Wall thickness is 1.6 mm. No adjacent pericholecystic fluid. Largest stone 2.1 cm. Common bile duct: Diameter: 8.9 mm Liver: Increased parenchymal echogenicity without focal mass. Mild central intrahepatic ductal dilatation. Portal vein is patent on  color Doppler imaging with normal direction of blood flow towards the liver. IVC: No abnormality visualized. Pancreas: Not visualized. Spleen: Size and appearance within normal limits. Right Kidney: Length: 11.2 cm. Echogenicity within normal limits. 1 cm cyst over the mid pole. No mass or hydronephrosis visualized. Left Kidney: Length: 10.0 cm 1.1 cm cyst over the mid pole. Echogenicity within normal limits. No mass or hydronephrosis visualized. Abdominal aorta: No aneurysm visualized. Other findings: None.  IMPRESSION: Contracted gallbladder with moderate cholelithiasis. No additional sonographic to suggest cholecystitis. Mild dilatation of the common bile duct. MRCP may be helpful in this patient with jaundice. Findings compatible with mild hepatic steatosis without focal mass. Small bilateral renal cysts. Electronically Signed   By: Marin Olp M.D.   On: 11/22/2017 21:16    EKG: Independently reviewed. Nsr, lad  Assessment/Plan Active Problems:   Type 2 diabetes mellitus ([place])   Hypertension   AKI (acute kidney injury) ([place])   S/P hernia repair   Biliary obstruction   Elevated transaminase level   Jaundice   Hyperbilirubinemia   Elevated alkaline phosphatase level   # Biliary obstruction -  Intra vs extrahepatic. over a month of progressively worsening symptoms. Labs showing severely elevated tbili and alk phos, with moderat lft evaluation. U/s w/o obstructing stone, mild dilation cbd. Did recently have abdominal surgery, so iatrogenic or sequelae of that surgery is a possibility. No known liver disease, no toxic habits or recent ingestions. Stable - d51/2ns @ 125 - npo - f/u hep panel, ggt, tsh, ana, ama, anti-sm, uds, tylenol level, hiv - gi consult in am, will need further eval of biliary tree, possibly mrcp - am cmp  # AKI on ckd  # Metabolic acidosis- bicarb 17, cr 2.73 from baseline ~1.5. Likely prerenal from decreased po. S/p 1 L NS in ED - IV fluids as above - am cmp  #  T2DM - glucose 200s - hold home meds, start SSI - fluids as above   # HTN - here normotensive - cont home amlodipine - hold home atorvastatin and asa  # bph - cont home tamsulosin  # hypothyroid - cont home levo; tsh as above  # peripheral neuropathy - cont home gabapentin    DVT prophylaxis: heparin, scds Code Status: dnr, confirmed w/ pt  Family Communication: wife Charlett Nose [phone]  Disposition Plan: tbd  Consults called: none  Admission status: med/surg    Desma Maxim MD [facility] Pager [phone]  If 7PM-7AM, please contact night-coverage [email or web address] Password [facility]  11/22/2017, 10:52 PM

## 2017-11-22 NOTE — ED Triage Notes (Signed)
Pt arrived via [facility]. Pt comes from home. Pt is AOx4 and ambulatory. Pt called 911 due to feeling weak for about 3 weeks. Wife was in hospital and came home today and saw how juandice husband is and insisted that pt call 911. Pt has had on and off nausea and vomitting for about 3 weeks as well.

## 2017-11-22 NOTE — ED Provider Notes (Signed)
[facility] DEPT Provider Note   CSN: [id number] Arrival date & time: 11/22/17  1843     History   Chief Complaint Chief Complaint  Patient presents with  . Fatigue    HPI Zachary Cook is a 84 y.o. male.  Pt c/o generalized weakness, decreased appetite and wt loss for past 5 weeks. Symptoms gradual onset, persistent, constant, slowly worse. In past week also with jaundice. No hx same. No hx etoh use/abuse. No hx hepatitis. Pt denies abd pain. +intermittent nausea and decreased appetite. No vomiting. Having normal bms. No fever or chills.   The history is provided by the patient and the EMS personnel.    Past Medical History:  Diagnosis Date  . Barrett's esophagus   . Bilateral inguinal hernia   . Cardiomegaly 02/21/2017   noted on CXR  . Cholelithiasis   . Chronic kidney disease    stage 3 Goes to [place] [facility]  . Diabetes mellitus without complication ([place])    type 2  . Diverticulosis   . Duodenitis   . GERD (gastroesophageal reflux disease)   . Hiatal hernia   . History of blood transfusion 05/2016  . History of GI bleed   . History of hyperkalemia 02/2017  . History of kidney stones    35 years ago  . Hyperlipemia   . Hypertension   . Hypogonadism in male   . Hypothyroidism   . Ileus following gastrointestinal surgery ([place]) 06/12/2016  . Skin cancer    right arm treated by dermatologist  . Syncope 06/03/2016  . Umbilical hernia    Small  . Ventral hernia    Anterior    Patient Active Problem List   Diagnosis Date Noted  . S/P hernia repair 08/01/2017  . Hyperlipidemia 03/14/2017  . Preoperative clearance 03/14/2017  . Malnutrition of moderate degree 06/13/2016  . Ileus following gastrointestinal surgery ([place]) 06/12/2016  . GI bleed 06/03/2016  . Syncope   . Diverticulosis of colon with hemorrhage   . Hematochezia   . Lower gastrointestinal bleeding   . BRBPR (bright red blood per rectum) 05/27/2016  .  Acute blood loss anemia 05/27/2016  . Hypotension due to blood loss 05/27/2016  . AKI (acute kidney injury) ([place]) 05/27/2016  . Hyperkalemia 05/27/2016  . Adult onset hypothyroidism 12/20/2013  . Type II diabetes mellitus, uncontrolled ([place]) 12/20/2013  . Hypogonadism in male 12/02/2013  . Type 2 diabetes mellitus ([place]) 05/09/2011  . Hypertension 05/09/2011  . GERD (gastroesophageal reflux disease) 05/09/2011  . Hypogonadism male 05/09/2011    Past Surgical History:  Procedure Laterality Date  . COLONOSCOPY    . ESOPHAGOGASTRODUODENOSCOPY    . INCISIONAL HERNIA REPAIR N/A 08/01/2017   Procedure: OPEN REPAIR INICISIONAL HERNIA;  Surgeon: Ralene Ok, MD;  Location: [facility];  Service: General;  Laterality: N/A;  TAP BLOCK  . INSERTION OF MESH N/A 08/01/2017   Procedure: INSERTION OF MESH;  Surgeon: Ralene Ok, MD;  Location: [facility];  Service: General;  Laterality: N/A;  . IR GENERIC HISTORICAL  05/28/2016   IR ANGIOGRAM SELECTIVE EACH ADDITIONAL VESSEL 05/28/2016 Aletta Edouard, MD [facility]  . IR GENERIC HISTORICAL  05/28/2016   IR ANGIOGRAM VISCERAL SELECTIVE 05/28/2016 Aletta Edouard, MD [facility]  . IR GENERIC HISTORICAL  05/28/2016   IR US GUIDE VASC ACCESS RIGHT 05/28/2016 Aletta Edouard, MD [facility]  . IR GENERIC HISTORICAL  05/28/2016   IR ANGIOGRAM VISCERAL SELECTIVE 05/28/2016 Arne Cleveland, MD [facility]  . IR GENERIC  HISTORICAL  05/28/2016   IR ANGIOGRAM SELECTIVE EACH ADDITIONAL VESSEL 05/28/2016 Arne Cleveland, MD [facility]  . IR GENERIC HISTORICAL  05/28/2016   IR EMBO ART  VEN HEMORR LYMPH EXTRAV  INC GUIDE ROADMAPPING 05/28/2016 Arne Cleveland, MD [facility]  . IR GENERIC HISTORICAL  05/28/2016   IR US GUIDE VASC ACCESS RIGHT 05/28/2016 Arne Cleveland, MD [facility]  . PARTIAL COLECTOMY N/A 06/06/2016   Procedure: OPEN ASCENDING COLECTOMY;  Surgeon: Stark Klein, MD;  Location: [facility];  Service: General;  Laterality: N/A;  . VASECTOMY           Home Medications    Prior to Admission medications   Medication Sig Start Date End Date Taking? Authorizing Provider  ACCU-CHEK SMARTVIEW test strip USE TO TEST TWICE DAILY 04/07/17   Elayne Snare, MD  amLODipine (NORVASC) 5 MG tablet TAKE 1 TABLET(5 MG) BY MOUTH DAILY 07/14/17   [name], MD  aspirin EC 81 MG tablet Take 81 mg by mouth daily.    [provider]  atorvastatin (LIPITOR) 10 MG tablet TAKE 1 TABLET(10 MG) BY MOUTH DAILY AT 6 PM 07/07/17   [name], MD  B-D UF III MINI PEN NEEDLES 31G X 5 MM MISC USE ONE PER DAY WITH VICTOZA 09/25/17   Elayne Snare, MD  Blood Glucose Monitoring Suppl (ACCU-CHEK NANO SMARTVIEW) W/DEVICE KIT  12/02/13   [provider]  esomeprazole (NEXIUM) 40 MG capsule TAKE ONE CAPSULE BY MOUTH DAILY AT NOON 08/12/17   [name], MD  gabapentin (NEURONTIN) 300 MG capsule Take 1 capsule (300 mg total) by mouth 2 (two) times daily. 08/03/17   Alphonsa Overall, MD  glimepiride (AMARYL) 2 MG tablet TAKE 2 TABLETS(4 MG) BY MOUTH DAILY BEFORE DINNER 08/20/17   Elayne Snare, MD  HYDROcodone-acetaminophen (NORCO/VICODIN) 5-325 MG tablet Take 1 tablet by mouth every 6 (six) hours as needed for moderate pain. 08/03/17   Alphonsa Overall, MD  Insulin Glargine (TOUJEO SOLOSTAR) 300 UNIT/ML SOPN Inject 16 Units into the skin daily. Patient taking differently: Inject 20 Units into the skin every evening.  07/07/17   Elayne Snare, MD  levothyroxine (SYNTHROID, LEVOTHROID) 137 MCG tablet TAKE 1 TABLET(137 MCG) BY MOUTH DAILY 08/14/17   Elayne Snare, MD  metFORMIN (GLUCOPHAGE-XR) 500 MG 24 hr tablet Take 2 tablets (1,000 mg total) by mouth daily with supper. 05/09/17   Elayne Snare, MD  Multiple Vitamins-Minerals (MULTIVITAMIN ADULT) TABS Take 1 tablet by mouth every other day.     [provider]  naproxen sodium (ALEVE) 220 MG tablet Take 440 mg by mouth daily as needed (pain).    [provider]  Omega-3 Fatty Acids (FISH OIL) 1200  MG CAPS Take 1,200 mg by mouth daily.    [provider]  tamsulosin (FLOMAX) 0.4 MG CAPS capsule TAKE 1 CAPSULE BY MOUTH DAILY 09/10/17   [name], MD  VICTOZA 18 MG/3ML SOPN ADMINISTER 1.2 MG UNDER THE SKIN DAILY 11/13/17   Elayne Snare, MD    Family History Family History  Problem Relation Age of Onset  . Heart disease Mother   . Diabetes Mother   . Cancer Father   . Heart attack Son     Social History Social History   Tobacco Use  . Smoking status: Never Smoker  . Smokeless tobacco: Never Used  Substance Use Topics  . Alcohol use: No    Alcohol/week: 0.0 standard drinks  . Drug use: No     Allergies  Patient has no known allergies.   Review of Systems Review of Systems  Constitutional: Positive for unexpected weight change. Negative for fever.  HENT: Negative for sore throat.   Eyes: Negative for redness.  Respiratory: Negative for shortness of breath.   Cardiovascular: Negative for chest pain.  Gastrointestinal: Negative for abdominal pain and vomiting.  Endocrine: Negative for polyuria.  Genitourinary: Negative for flank pain.  Musculoskeletal: Negative for back pain and neck pain.  Skin: Negative for rash.  Neurological: Positive for weakness. Negative for numbness and headaches.  Hematological: Does not bruise/bleed easily.  Psychiatric/Behavioral: Negative for confusion.     Physical Exam Updated Vital Signs Ht 1.626 m (_0 )   Wt 66.7 kg   SpO2 97%   BMI 25.23 kg/m   Physical Exam  Constitutional: He is oriented to person, place, and time. He appears well-developed and well-nourished.  HENT:  Mouth/Throat: Oropharynx is clear and moist.  Eyes: Conjunctivae are normal. Scleral icterus is present.  Neck: Neck supple. No tracheal deviation present.  Cardiovascular: Normal rate, regular rhythm, normal heart sounds and intact distal pulses. Exam reveals no gallop and no friction rub.  No murmur heard. Pulmonary/Chest: Effort normal  and breath sounds normal. No accessory muscle usage. No respiratory distress.  Abdominal: Soft. Bowel sounds are normal. He exhibits no distension and no mass. There is no tenderness. There is no rebound and no guarding. No hernia.  Genitourinary:  Genitourinary Comments: No cva tenderness  Musculoskeletal: He exhibits no edema.  Neurological: He is alert and oriented to person, place, and time.  Skin: Skin is warm and dry.  Psychiatric: He has a normal mood and affect.  Nursing note and vitals reviewed.    ED Treatments / Results  Labs (all labs ordered are listed, but only abnormal results are displayed) Results for orders placed or performed during the hospital encounter of 11/22/17  Lipase, blood  Result Value Ref Range   Lipase 67 (H) 11 - 51 U/L  Comprehensive metabolic panel  Result Value Ref Range   Sodium 134 (L) 135 - 145 mmol/L   Potassium 3.6 3.5 - 5.1 mmol/L   Chloride 102 98 - 111 mmol/L   CO2 17 (L) 22 - 32 mmol/L   Glucose, Bld 249 (H) 70 - 99 mg/dL   BUN 55 (H) 8 - 23 mg/dL   Creatinine, Ser 2.73 (H) 0.61 - 1.24 mg/dL   Calcium 10.0 8.9 - 10.3 mg/dL   Total Protein 7.5 6.5 - 8.1 g/dL   Albumin 2.9 (L) 3.5 - 5.0 g/dL   AST 480 (H) 15 - 41 U/L   ALT 571 (H) 0 - 44 U/L   Alkaline Phosphatase 2,477 (H) 38 - 126 U/L   Total Bilirubin 36.0 (HH) 0.3 - 1.2 mg/dL   GFR calc non Af Amer 20 (L) >60 mL/min   GFR calc Af Amer 23 (L) >60 mL/min   Anion gap 15 5 - 15  CBC with Differential  Result Value Ref Range   WBC 7.0 4.0 - 10.5 K/uL   RBC 4.24 4.22 - 5.81 MIL/uL   Hemoglobin 11.9 (L) 13.0 - 17.0 g/dL   HCT 36.1 (L) 39.0 - 52.0 %   MCV 85.1 78.0 - 100.0 fL   MCH 28.1 26.0 - 34.0 pg   MCHC 33.0 30.0 - 36.0 g/dL   RDW 18.2 (H) 11.5 - 15.5 %   Platelets 233 150 - 400 K/uL   Neutrophils Relative % 74 %   Neutro Abs  5.2 1.7 - 7.7 K/uL   Lymphocytes Relative 19 %   Lymphs Abs 1.3 0.7 - 4.0 K/uL   Monocytes Relative 5 %   Monocytes Absolute 0.4 0.1 - 1.0 K/uL    Eosinophils Relative 1 %   Eosinophils Absolute 0.1 0.0 - 0.7 K/uL   Basophils Relative 1 %   Basophils Absolute 0.1 0.0 - 0.1 K/uL  Protime-INR  Result Value Ref Range   Prothrombin Time 16.9 (H) 11.4 - 15.2 seconds   INR 1.38    US Abdomen Complete  Result Date: 11/22/2017 CLINICAL DATA:  Jaundice with weight loss over 4 weeks. EXAM: ABDOMEN ULTRASOUND COMPLETE COMPARISON:  CT 02/13/2017 FINDINGS: Gallbladder: Contracted gallbladder full of stones. Negative sonographic Murphy sign. Wall thickness is 1.6 mm. No adjacent pericholecystic fluid. Largest stone 2.1 cm. Common bile duct: Diameter: 8.9 mm Liver: Increased parenchymal echogenicity without focal mass. Mild central intrahepatic ductal dilatation. Portal vein is patent on color Doppler imaging with normal direction of blood flow towards the liver. IVC: No abnormality visualized. Pancreas: Not visualized. Spleen: Size and appearance within normal limits. Right Kidney: Length: 11.2 cm. Echogenicity within normal limits. 1 cm cyst over the mid pole. No mass or hydronephrosis visualized. Left Kidney: Length: 10.0 cm 1.1 cm cyst over the mid pole. Echogenicity within normal limits. No mass or hydronephrosis visualized. Abdominal aorta: No aneurysm visualized. Other findings: None. IMPRESSION: Contracted gallbladder with moderate cholelithiasis. No additional sonographic to suggest cholecystitis. Mild dilatation of the common bile duct. MRCP may be helpful in this patient with jaundice. Findings compatible with mild hepatic steatosis without focal mass. Small bilateral renal cysts. Electronically Signed   By: Marin Olp M.D.   On: 11/22/2017 21:16    EKG None  Radiology US Abdomen Complete  Result Date: 11/22/2017 CLINICAL DATA:  Jaundice with weight loss over 4 weeks. EXAM: ABDOMEN ULTRASOUND COMPLETE COMPARISON:  CT 02/13/2017 FINDINGS: Gallbladder: Contracted gallbladder full of stones. Negative sonographic Murphy sign. Wall thickness is  1.6 mm. No adjacent pericholecystic fluid. Largest stone 2.1 cm. Common bile duct: Diameter: 8.9 mm Liver: Increased parenchymal echogenicity without focal mass. Mild central intrahepatic ductal dilatation. Portal vein is patent on color Doppler imaging with normal direction of blood flow towards the liver. IVC: No abnormality visualized. Pancreas: Not visualized. Spleen: Size and appearance within normal limits. Right Kidney: Length: 11.2 cm. Echogenicity within normal limits. 1 cm cyst over the mid pole. No mass or hydronephrosis visualized. Left Kidney: Length: 10.0 cm 1.1 cm cyst over the mid pole. Echogenicity within normal limits. No mass or hydronephrosis visualized. Abdominal aorta: No aneurysm visualized. Other findings: None. IMPRESSION: Contracted gallbladder with moderate cholelithiasis. No additional sonographic to suggest cholecystitis. Mild dilatation of the common bile duct. MRCP may be helpful in this patient with jaundice. Findings compatible with mild hepatic steatosis without focal mass. Small bilateral renal cysts. Electronically Signed   By: Marin Olp M.D.   On: 11/22/2017 21:16    Procedures Procedures (including critical care time)  Medications Ordered in ED Medications - No data to display   Initial Impression / Assessment and Plan / ED Course  I have reviewed the triage vital signs and the nursing notes.  Pertinent labs & imaging results that were available during my care of the patient were reviewed by me and considered in my medical decision making (see chart for details).  Iv ns. Labs sent.   Reviewed nursing notes and prior charts for additional history.   Labs reviewed - marked elevation  of lfts.   abd soft nt.  U/s reviewed - gallstones, hx same, no gross biliary dilation, no other findings of cholecystitis.   Medical service consulted for admission.    Final Clinical Impressions(s) / ED Diagnoses   Final diagnoses:  None    ED Discharge Orders      None       Lajean Saver, MD 11/22/17 2200

## 2017-11-23 ENCOUNTER — Encounter (HOSPITAL_COMMUNITY): Payer: Self-pay | Admitting: *Deleted

## 2017-11-23 LAB — GLUCOSE, CAPILLARY
GLUCOSE-CAPILLARY: 252 mg/dL — AB (ref 70–99)
GLUCOSE-CAPILLARY: 71 mg/dL (ref 70–99)
GLUCOSE-CAPILLARY: 74 mg/dL (ref 70–99)
Glucose-Capillary: 71 mg/dL (ref 70–99)

## 2017-11-23 LAB — COMPREHENSIVE METABOLIC PANEL
ALBUMIN: 2.3 g/dL — AB (ref 3.5–5.0)
ALK PHOS: 1983 U/L — AB (ref 38–126)
ALT: 405 U/L — ABNORMAL HIGH (ref 0–44)
AST: 372 U/L — AB (ref 15–41)
Anion gap: 13 (ref 5–15)
BUN: 52 mg/dL — AB (ref 8–23)
CALCIUM: 8.9 mg/dL (ref 8.9–10.3)
CO2: 14 mmol/L — ABNORMAL LOW (ref 22–32)
CREATININE: 2.44 mg/dL — AB (ref 0.61–1.24)
Chloride: 106 mmol/L (ref 98–111)
GFR calc Af Amer: 27 mL/min — ABNORMAL LOW (ref >60)
GFR, EST NON AFRICAN AMERICAN: 23 mL/min — AB (ref >60)
GLUCOSE: 271 mg/dL — AB (ref 70–99)
Potassium: 4.2 mmol/L (ref 3.5–5.1)
Sodium: 133 mmol/L — ABNORMAL LOW (ref 135–145)
TOTAL PROTEIN: 5.6 g/dL — AB (ref 6.5–8.1)
Total Bilirubin: 29 mg/dL (ref 0.3–1.2)

## 2017-11-23 LAB — CBC
HCT: 32.5 % — ABNORMAL LOW (ref 39.0–52.0)
Hemoglobin: 10.4 g/dL — ABNORMAL LOW (ref 13.0–17.0)
MCH: 27.1 pg (ref 26.0–34.0)
MCHC: 32 g/dL (ref 30.0–36.0)
MCV: 84.6 fL (ref 78.0–100.0)
Platelets: 219 10*3/uL (ref 150–400)
RBC: 3.84 MIL/uL — AB (ref 4.22–5.81)
RDW: 17.8 % — ABNORMAL HIGH (ref 11.5–15.5)
WBC: 5.2 10*3/uL (ref 4.0–10.5)

## 2017-11-23 LAB — RAPID URINE DRUG SCREEN, HOSP PERFORMED
Amphetamines: NOT DETECTED
BARBITURATES: NOT DETECTED
Benzodiazepines: NOT DETECTED
Cocaine: NOT DETECTED
Opiates: NOT DETECTED
Tetrahydrocannabinol: NOT DETECTED

## 2017-11-23 LAB — URINALYSIS, ROUTINE W REFLEX MICROSCOPIC
GLUCOSE, UA: 50 mg/dL — AB
Ketones, ur: NEGATIVE mg/dL
Leukocytes, UA: NEGATIVE
Nitrite: NEGATIVE
PH: 5 (ref 5.0–8.0)
Protein, ur: NEGATIVE mg/dL
SPECIFIC GRAVITY, URINE: 1.008 (ref 1.005–1.030)

## 2017-11-23 LAB — HIV ANTIBODY (ROUTINE TESTING W REFLEX): HIV SCREEN 4TH GENERATION: NONREACTIVE

## 2017-11-23 LAB — ACETAMINOPHEN LEVEL

## 2017-11-23 LAB — TSH: TSH: 0.949 u[IU]/mL (ref 0.350–4.500)

## 2017-11-23 MED ORDER — INFLUENZA VAC SPLIT HIGH-DOSE 0.5 ML IM SUSY
0.5000 mL | PREFILLED_SYRINGE | INTRAMUSCULAR | Status: DC
  Filled 2017-11-23: qty 0.5

## 2017-11-23 MED ORDER — INSULIN GLARGINE 100 UNIT/ML ~~LOC~~ SOLN
16.0000 [IU] | Freq: Every day | SUBCUTANEOUS | Status: DC
  Administered 2017-11-23 – 2017-11-27 (×4): 16 [IU] via SUBCUTANEOUS
  Filled 2017-11-23 (×6): qty 0.16

## 2017-11-23 MED ORDER — LIP MEDEX EX OINT
TOPICAL_OINTMENT | CUTANEOUS | Status: AC
  Administered 2017-11-23: 1 via TOPICAL
  Filled 2017-11-23: qty 7

## 2017-11-23 MED ORDER — HYDROXYZINE HCL 10 MG PO TABS
10.0000 mg | ORAL_TABLET | Freq: Four times a day (QID) | ORAL | Status: DC | PRN
  Administered 2017-11-23: 10 mg via ORAL
  Filled 2017-11-23 (×2): qty 1

## 2017-11-23 NOTE — Consult Note (Addendum)
Referring Provider: Dr. Alfredia Ferguson Primary Care Physician:  [name], MD Primary Gastroenterologist:  Dr.  Luiz Iron for Consultation:  Jaundice   HPI: Zachary Cook is a 84 y.o. male with medical history significant for hypothyroidism, htn, gi bleed (diverticulosis, s/p partial colectomy), incisional hernia repair 07/2017, T2DM, who presented to [facility] ED with complaints of turning yellow and nausea/vomiting, inability to take PO.   Symptoms initially began 5 weeks or so ago and have progressively worsened.  Began feeling fatigued, then at times dizzy when ambulating. Then developed nausea, worse with eating, with vomiting after most meals. PO intake decreased, says has lost 30-40 pounds. Began to develop jaundice about a week ago. Says was evaluated by PCP early in this course and says he thinks pcp wasn't much concerned, sounds like symptoms mild at that time.  No fevers. No abdominal pain. No diarrhea, no blood in stool. No new meds. Not a drinker. No history liver or gallbladder problems. No history cancer.  In the ED LFT's found to be elevated as follows:  AST 480 now 372 today, ALT 571 now 405 today, ALP 2477 now 1983 today, total bili 36 now 29 today.  LFT's completely normal 3 months ago.  Ultrasound showed the following:  IMPRESSION: Contracted gallbladder with moderate cholelithiasis. No additional sonographic to suggest cholecystitis. Mild dilatation of the common bile duct. MRCP may be helpful in this patient with jaundice.  Findings compatible with mild hepatic steatosis without focal mass.  Small bilateral renal cysts.  MRCP has been ordered.  Is complaining of being very itchy.   Past Medical History:  Diagnosis Date  . Barrett's esophagus   . Bilateral inguinal hernia   . Cardiomegaly 02/21/2017   noted on CXR  . Cholelithiasis   . Chronic kidney disease    stage 3 Goes to [place] [facility]  . Diabetes mellitus without complication ([place])    type 2  .  Diverticulosis   . Duodenitis   . GERD (gastroesophageal reflux disease)   . Hiatal hernia   . History of blood transfusion 05/2016  . History of GI bleed   . History of hyperkalemia 02/2017  . History of kidney stones    35 years ago  . Hyperlipemia   . Hypertension   . Hypogonadism in male   . Hypothyroidism   . Ileus following gastrointestinal surgery ([place]) 06/12/2016  . Skin cancer    right arm treated by dermatologist  . Syncope 06/03/2016  . Umbilical hernia    Small  . Ventral hernia    Anterior    Past Surgical History:  Procedure Laterality Date  . COLONOSCOPY    . ESOPHAGOGASTRODUODENOSCOPY    . INCISIONAL HERNIA REPAIR N/A 08/01/2017   Procedure: OPEN REPAIR INICISIONAL HERNIA;  Surgeon: Ralene Ok, MD;  Location: [facility];  Service: General;  Laterality: N/A;  TAP BLOCK  . INSERTION OF MESH N/A 08/01/2017   Procedure: INSERTION OF MESH;  Surgeon: Ralene Ok, MD;  Location: [facility];  Service: General;  Laterality: N/A;  . IR GENERIC HISTORICAL  05/28/2016   IR ANGIOGRAM SELECTIVE EACH ADDITIONAL VESSEL 05/28/2016 Aletta Edouard, MD [facility]  . IR GENERIC HISTORICAL  05/28/2016   IR ANGIOGRAM VISCERAL SELECTIVE 05/28/2016 Aletta Edouard, MD [facility]  . IR GENERIC HISTORICAL  05/28/2016   IR US GUIDE VASC ACCESS RIGHT 05/28/2016 Aletta Edouard, MD [facility]  . IR GENERIC HISTORICAL  05/28/2016   IR ANGIOGRAM VISCERAL SELECTIVE 05/28/2016 Arne Cleveland, MD [facility]  .  IR GENERIC HISTORICAL  05/28/2016   IR ANGIOGRAM SELECTIVE EACH ADDITIONAL VESSEL 05/28/2016 Arne Cleveland, MD [facility]  . IR GENERIC HISTORICAL  05/28/2016   IR EMBO ART  VEN HEMORR LYMPH EXTRAV  INC GUIDE ROADMAPPING 05/28/2016 Arne Cleveland, MD [facility]  . IR GENERIC HISTORICAL  05/28/2016   IR US GUIDE VASC ACCESS RIGHT 05/28/2016 Arne Cleveland, MD [facility]  . PARTIAL COLECTOMY N/A 06/06/2016   Procedure: OPEN ASCENDING COLECTOMY;  Surgeon: Stark Klein, MD;   Location: [facility];  Service: General;  Laterality: N/A;  . VASECTOMY      Prior to Admission medications   Medication Sig Start Date End Date Taking? Authorizing Provider  ACCU-CHEK SMARTVIEW test strip USE TO TEST TWICE DAILY Patient taking differently: 1 each by Other route 2 (two) times daily.  04/07/17  Yes Elayne Snare, MD  amLODipine (NORVASC) 5 MG tablet TAKE 1 TABLET(5 MG) BY MOUTH DAILY Patient taking differently: Take 5 mg by mouth daily.  07/14/17  Yes [name], MD  aspirin EC 81 MG tablet Take 81 mg by mouth daily.   Yes [provider]  atorvastatin (LIPITOR) 10 MG tablet TAKE 1 TABLET(10 MG) BY MOUTH DAILY AT 6 PM Patient taking differently: Take 10 mg by mouth daily at 6 PM.  07/07/17  Yes [name], MD  B-D UF III MINI PEN NEEDLES 31G X 5 MM MISC USE ONE PER DAY WITH VICTOZA Patient taking differently: Inject 1 each into the skin daily.  09/25/17  Yes Elayne Snare, MD  esomeprazole (NEXIUM) 40 MG capsule TAKE ONE CAPSULE BY MOUTH DAILY AT NOON Patient taking differently: Take 40 mg by mouth daily at 12 noon.  08/12/17  Yes [name], MD  glimepiride (AMARYL) 2 MG tablet TAKE 2 TABLETS(4 MG) BY MOUTH DAILY BEFORE DINNER Patient taking differently: Take 4 mg by mouth daily with breakfast.  08/20/17  Yes Elayne Snare, MD  Insulin Glargine (TOUJEO SOLOSTAR) 300 UNIT/ML SOPN Inject 16 Units into the skin daily. Patient taking differently: Inject 20 Units into the skin every evening.  07/07/17  Yes Elayne Snare, MD  levothyroxine (SYNTHROID, LEVOTHROID) 137 MCG tablet TAKE 1 TABLET(137 MCG) BY MOUTH DAILY Patient taking differently: Take 137 mcg by mouth daily before breakfast.  08/14/17  Yes Elayne Snare, MD  metFORMIN (GLUCOPHAGE-XR) 500 MG 24 hr tablet Take 2 tablets (1,000 mg total) by mouth daily with supper. 05/09/17  Yes Elayne Snare, MD  tamsulosin (FLOMAX) 0.4 MG CAPS capsule TAKE 1 CAPSULE BY MOUTH DAILY Patient taking differently: Take 0.4 mg by mouth  daily.  09/10/17  Yes [name], MD  VICTOZA 18 MG/3ML SOPN ADMINISTER 1.2 MG UNDER THE SKIN DAILY Patient taking differently: Inject 1.2 mg into the skin daily. ADMINISTER 1.2 MG UNDER THE SKIN DAILY 11/13/17  Yes Elayne Snare, MD  gabapentin (NEURONTIN) 300 MG capsule Take 1 capsule (300 mg total) by mouth 2 (two) times daily. Patient not taking: Reported on 11/22/2017 08/03/17   Alphonsa Overall, MD  HYDROcodone-acetaminophen (NORCO/VICODIN) 5-325 MG tablet Take 1 tablet by mouth every 6 (six) hours as needed for moderate pain. Patient not taking: Reported on 11/22/2017 08/03/17   Alphonsa Overall, MD    Current Facility-Administered Medications  Medication Dose Route Frequency Provider Last Rate Last Dose  . amLODipine (NORVASC) tablet 5 mg  5 mg Oral Daily Wouk, Ailene Rud, MD      . dextrose 5 %-0.45 % sodium chloride infusion   Intravenous Continuous  Gwynne Edinger, MD 125 mL/hr at 11/23/17 [phone]    . gabapentin (NEURONTIN) capsule 300 mg  300 mg Oral BID Gwynne Edinger, MD   300 mg at 11/23/17 0046  . heparin injection 5,000 Units  5,000 Units Subcutaneous Q8H Gwynne Edinger, MD   5,000 Units at 11/23/17 [phone]  . [START ON 11/24/2017] Influenza vac split quadrivalent PF (FLUZONE HIGH-DOSE) injection 0.5 mL  0.5 mL Intramuscular Tomorrow-1000 Triadhosp, Wladmits, MD      . insulin aspart (novoLOG) injection 0-15 Units  0-15 Units Subcutaneous TID WC Wouk, Ailene Rud, MD   8 Units at 11/23/17 [phone]  . levothyroxine (SYNTHROID, LEVOTHROID) tablet 137 mcg  137 mcg Oral QAC breakfast Wouk, Ailene Rud, MD      . ondansetron [facility]) tablet 4 mg  4 mg Oral Q6H PRN Wouk, Ailene Rud, MD       Or  . ondansetron [facility]) injection 4 mg  4 mg Intravenous Q6H PRN Wouk, Ailene Rud, MD      . tamsulosin [facility]) capsule 0.4 mg  0.4 mg Oral Daily Wouk, Ailene Rud, MD        Allergies as of 11/22/2017  . (No Known Allergies)    Family History  Problem Relation Age of Onset  . Heart  disease Mother   . Diabetes Mother   . Cancer Father   . Heart attack Son     Social History   Socioeconomic History  . Marital status: Married    Spouse name: Not on file  . Number of children: Not on file  . Years of education: Not on file  . Highest education level: Not on file  Occupational History  . Not on file  Social Needs  . Financial resource strain: Not on file  . Food insecurity:    Worry: Not on file    Inability: Not on file  . Transportation needs:    Medical: Not on file    Non-medical: Not on file  Tobacco Use  . Smoking status: Never Smoker  . Smokeless tobacco: Never Used  Substance and Sexual Activity  . Alcohol use: No    Alcohol/week: 0.0 standard drinks  . Drug use: No  . Sexual activity: Not Currently  Lifestyle  . Physical activity:    Days per week: Not on file    Minutes per session: Not on file  . Stress: Not on file  Relationships  . Social connections:    Talks on phone: Not on file    Gets together: Not on file    Attends religious service: Not on file    Active member of club or organization: Not on file    Attends meetings of clubs or organizations: Not on file    Relationship status: Not on file  . Intimate partner violence:    Fear of current or ex partner: Not on file    Emotionally abused: Not on file    Physically abused: Not on file    Forced sexual activity: Not on file  Other Topics Concern  . Not on file  Social History Narrative  . Not on file    Review of Systems: ROS is O/W negative except as mentioned in HPI.  Physical Exam: Vital signs in last 24 hours: Temp:  [97.7 F (36.5 C)-98 F (36.7 C)] 98 F (36.7 C) (09/15 0516) Pulse Rate:  [60-84] 66 (09/15 0516) Resp:  [13-24] 16 (09/15 0516) BP: (117-141)/(65-84) 134/69 (09/15 0516) SpO2:  [95 %-100 %]  98 % (09/15 0516) Weight:  [66.7 kg] 66.7 kg (09/14 1859) Last BM Date: 11/22/17 General:  Alert, Well-developed, well-nourished, pleasant and cooperative  in NAD; deep jaundice Head:  Normocephalic and atraumatic. Eyes:  Scleral icterus noted. Ears:  Normal auditory acuity. Mouth:  No deformity or lesions.   Lungs:  Clear throughout to auscultation.   No wheezes, crackles, or rhonchi.  Heart:  Regular rate and rhythm; no murmurs, clicks, rubs, or gallops. Abdomen:  Soft,non-distended.  BS present.  Non-tender.  Scars noted from previous surgeries.  Msk:  Symmetrical without gross deformities. . Pulses:  Normal pulses noted. Extremities:  Without clubbing or edema. Neurologic:  Alert and oriented x 4;  grossly normal neurologically. Skin:  Intact without significant lesions or rashes.. Psych:  Alert and cooperative. Normal mood and affect.  Intake/Output from previous day: 09/14 0701 - 09/15 0700 In: 1258.3 [I.V.:258.3; IV Piggyback:1000] Out: 200 [Urine:200]  Lab Results: Recent Labs    11/22/17 1908 11/23/17 0112  WBC 7.0 5.2  HGB 11.9* 10.4*  HCT 36.1* 32.5*  PLT 233 219   BMET Recent Labs    11/22/17 1908 11/23/17 0112  NA 134* 133*  K 3.6 4.2  CL 102 106  CO2 17* 14*  GLUCOSE 249* 271*  BUN 55* 52*  CREATININE 2.73* 2.44*  CALCIUM 10.0 8.9   LFT Recent Labs    11/23/17 0112  PROT 5.6*  ALBUMIN 2.3*  AST 372*  ALT 405*  ALKPHOS 1,983*  BILITOT 29.0*   PT/INR Recent Labs    11/22/17 1908  LABPROT 16.9*  INR 1.38   Studies/Results: US Abdomen Complete  Result Date: 11/22/2017 CLINICAL DATA:  Jaundice with weight loss over 4 weeks. EXAM: ABDOMEN ULTRASOUND COMPLETE COMPARISON:  CT 02/13/2017 FINDINGS: Gallbladder: Contracted gallbladder full of stones. Negative sonographic Murphy sign. Wall thickness is 1.6 mm. No adjacent pericholecystic fluid. Largest stone 2.1 cm. Common bile duct: Diameter: 8.9 mm Liver: Increased parenchymal echogenicity without focal mass. Mild central intrahepatic ductal dilatation. Portal vein is patent on color Doppler imaging with normal direction of blood flow towards the  liver. IVC: No abnormality visualized. Pancreas: Not visualized. Spleen: Size and appearance within normal limits. Right Kidney: Length: 11.2 cm. Echogenicity within normal limits. 1 cm cyst over the mid pole. No mass or hydronephrosis visualized. Left Kidney: Length: 10.0 cm 1.1 cm cyst over the mid pole. Echogenicity within normal limits. No mass or hydronephrosis visualized. Abdominal aorta: No aneurysm visualized. Other findings: None. IMPRESSION: Contracted gallbladder with moderate cholelithiasis. No additional sonographic to suggest cholecystitis. Mild dilatation of the common bile duct. MRCP may be helpful in this patient with jaundice. Findings compatible with mild hepatic steatosis without focal mass. Small bilateral renal cysts. Electronically Signed   By: Marin Olp M.D.   On: 11/22/2017 21:16   IMPRESSION:  *84 year old male with painless jaundice as well as nausea, vomiting, inability to take much PO and 30-40 pound weight loss over the past 5 weeks.  All LFT's elevated but especially ALP and total bili.  Suspect malignancy whether pancreatic, gallbladder, or cholangiocarcinoma.  PLAN: *Await results of MRCP and pending labs. *Further intervention will be determined pending those results. *Atarax ordered prn for itching.   Laban Emperor. Zehr  11/23/2017, 8:53 AM   Attending physician's note   I have taken an interval history, reviewed the chart and examined the patient. I agree with the Advanced Practitioner's note, impression, and recommendations as outlined.   84 year old male with multiple medical problems  including hypertension, hyperlipidemia, CKD stage III, GERD, Barrett's esophagus, diabetes along with history of diverticulosis with diverticular bleed requiring IR guided coil placement then right hemicolectomy in 2018 with subsequent incisional hernia repair in May 2019, presenting with recent progressive fatigue, lightheadedness, nausea, decreased p.o. intake and a 35 pound  weight loss.  He then developed jaundice/icteric sclera over the last week or so along with darkening of his urine and acholic stools.    Admission labs in the ER notable for AST 480, ALT 571, ALP 2477, T bili 36 along with AKI on CKD (creatinine 2.73 from baseline 1.4-1.7), mild coagulopathy (INR 1.4, baseline 1.1) and metabolic acidosis.  RUQ US demonstrates cholelithiasis without cholecystitis, CBD 9 mm and mild intrahepatic duct dilatation without choledocholithiasis.  Of note his most recent liver enzymes in June 2019 were normal, including T bili 0.5 and ALP 66.  No previous history of elevated liver enzymes on chart review.  He denies any known personal or family history of hepatobiliary or pancreatic disease.  He has never had any similar symptoms.  He did have a CT in December 2018 which was notable for decreased liver attenuation consistent with fatty infiltration, gallbladder stones and otherwise normal-appearing pancreas.  There was also note of some wall thickening along the distal aspect of the gallbladder and "it would be difficult to exclude a focal mass ".  1) Painless jaundice: New onset elevated liver enzymes, significantly elevated T bili and ALP concerning for malignant etiology.  Ultrasound without choledocholithiasis with CBD at ULN for age.   - Agree with MRCP today to evaluate for hepatobiliary disease - Continue to trend LAEs -Additional endoscopic evaluation (i.e. EUS) pending MRCP findings  2) Weight Loss: As above, certainly concerning for malignant etiology for constellation of symptoms.  Documented 35 pound weight loss since May 2019.  Low serum albumin likely related to poor p.o. intake/tolerance. -Check pre-albumin  3) AKI on CKD:  Also likely related to recent poor p.o. intake.  Agree with IV fluids and trending BMP  4) Coagulopathy: Potentially related to reduced nutritional status although could be early marker of disruption to hepatic synthetic function from  primary etiology has been evaluated above.  Will continue to monitor.   5) Diverticulosis: History of diverticulosis with complications (diverticular bleed) in 2018 requiring right hemicolectomy.  Recent incisional hernia repair surgery in May 2019.  However, with normal liver enzymes pre-and postop, doubt this is of any clinical significance on current admission.  Otherwise, without any diverticular associated complaints at this time.  [address] DO, FACG [phone] office

## 2017-11-23 NOTE — Progress Notes (Signed)
Pt stable with no needs. Patient and family updated on plan of care and testing tomorrow. Pt remains jaundiced and weak overall. No signs of distress. Pt denies pain. Will continue to monitor.

## 2017-11-23 NOTE — Progress Notes (Addendum)
PROGRESS NOTE    Zachary Cook  EGB:[id number] DOB: [birth year] DOA: 11/22/2017 PCP: [name], MD   Brief Narrative:  HPI per Dr. Laurey Arrow 11/22/17 Zachary Cook is a 84 y.o. male with medical history significant for hypothyroid, htn, gi bleed (diverticulosis, s/p partial colectomy), incisional hernia repair 07/2017, T2DM, who presents with above.  Symptoms began 5 wks ago and have progressively worsened. Began feeling fatigued, then at times dizzy when ambulating. Then developed nausea, worse with eating, with vomiting after most meals. PO intake decreased, says has lost 30-40 pounds. Began to develop jaundice about a week ago. Says was evaluated by PCP early in this course and says he thinks pcp wasn't much concerned, sounds like symptoms mild at that time. Also had an episode of white/tan stools that have resolved. No fevers. No abdominal pain. No diarrhea, no blood in stool. No new meds. Not a drinker. No history liver or gallbladder problems. No history cancer.  **MRCP was ordered and Gastroenterology was consulted for further evaluation and recommendations.   Assessment & Plan:   Active Problems:   Type 2 diabetes mellitus (HCC)   Hypertension   AKI (acute kidney injury) ([place])   S/P hernia repair   Biliary obstruction   Elevated transaminase level   Jaundice of recent onset   Hyperbilirubinemia   Elevated alkaline phosphatase level  Severe Painless Jaundice and Hyperbilirubinemia along with elevated LFTs and Alk Phos ? Cancer (most likely) vs. Biliary Obstruction -Admitted to Inpatient -Has history of nausea, vomiting and poor p.o. intake with 30 to 40 pound weight loss over the last 5 weeks -Alk phos on admission was 2477 is now trended down to 1983 -Total bilirubin on admission was 36.0 -> 29.0 -AST went from 480 and is now trended down to 372 -ALT went from 571 to 405 -Denies any Abdominal Pain -Check CA-19-9 and CEA -U/S abdomen showed contracted  gallbladder with moderate cholelithiasis.  No additional sonographic evidence to suggest cholecystitis.  There is mild dilatation of the common bile duct.  There is also findings with mild hepatic steatosis without focal mass and small bilateral renal cysts -Ordered MRCP for further evaluation  -Gastroenterology consulted for further evaluation recommendations -Gastroenterology started patient on Hydroxyzine 10 mg p.o. every 6 PRN for itching  AKI on CKD Stage 3 -Patient's BUN/creatinine on admission was 55/2.73 and then trended down to 52/2.44 -Likely in the setting of nausea, vomiting and poor p.o. intake -1 L normal saline bolus and then started on D5 half-normal saline at rate of 125 mL's per hour we will continue -Avoid nephrotoxic medications possible -Repeat CMP in a.m.  Diabetes Mellitus Type 2 -Holding Home metformin XR thousand milligrams daily, Victoza 1.2 mg subcu, along with glimepiride 2 mg nightly -Resume Home Insulin and will start the patient on 16 units of Lantus daily history of home regimen of 20 units -Started on Moderate Novolog SSI AC -Last HbA1c was 7.4 back in June; will repeat hemoglobin A1c here -CBG's ranging from 71-252 -Hypoglycemic Protocol   Essential Hypertension -BP is currently at Goal 134/69 -Continue Home Amlodipine 5 mg po Daily   HLD -LFTs are currently elevated so we will hold his home Atorvastatin  GERD -Currently not taking Any acid suppressing medications  BPH -C/w Tamsulosin 0.4 mg p.o. daily  Hypothyroidism -TSH was 949 -Continue with Levothyroxine 137 mcg p.o. daily before breakfast  Peripheral Neuropathy -Continue home Gabapentin 300 mill grams p.o. twice daily  DVT prophylaxis: Heparin 5,000 sq q8h Code  Status: DO NOT RESUSCITATE Family Communication: No family present at bedside Disposition Plan: Remain Inpatient for further workup and treatment  Consultants:   Gastroenterology    Procedures:  None  Antimicrobials:  Anti-infectives (From admission, onward)   None     Subjective: Seen and examined at bedside and states that he had no abdominal pain.  Had been nauseous and vomiting previously but none currently.  Noticed being yellow over last week or so.  States he lost 35 pounds in the last 5 weeks.  Denies any chest pain, shortness breath, nausea, vomiting.  No other complaints or concerns at this time and understands that he needs further work-up.  Objective: Vitals:   11/22/17 2200 11/22/17 2300 11/22/17 2346 11/23/17 0516  BP: 123/68 (!) 141/70 135/84 134/69  Pulse: 67 65 60 66  Resp: 16 (!) '24 14 16  '$ Temp:   97.8 F (36.6 C) 98 F (36.7 C)  TempSrc:   Oral Oral  SpO2: 97% 95% 100% 98%  Weight:      Height:        Intake/Output Summary (Last 24 hours) at 11/23/2017 1245 Last data filed at 11/23/2017 1026 Gross per 24 hour  Intake 2240.19 ml  Output 400 ml  Net 1840.19 ml   Filed Weights   11/22/17 1859  Weight: 66.7 kg   Examination: Physical Exam:  Constitutional: WN/WD Caucasian male NAD and appears calm and comfortable Eyes: Lids and conjunctivae normal, sclerae is icteric  ENMT: External Ears, Nose appear normal. Grossly normal hearing. Neck: Appears normal, supple, no cervical masses, normal ROM, no appreciable thyromegaly, no JVD Respiratory: Diminished to auscultation bilaterally, no wheezing, rales, rhonchi or crackles. Normal respiratory effort and patient is not tachypenic. No accessory muscle use.  Cardiovascular: RRR, no murmurs / rubs / gallops. S1 and S2 auscultated. No extremity edema.  Abdomen: Soft, non-tender, Slightly distended. Bowel sounds positive x4. Has a larg scar from prior surgery GU: Deferred. Musculoskeletal: No clubbing / cyanosis of digits/nails. Normal strength and muscle tone.  Skin: Significant yellowing of the skin.  No appreciable rashes or lesions on limited skin evaluation Neurologic: CN 2-12 grossly intact with no  focal deficits. Romberg sign and cerebellar reflexes not assessed.  Psychiatric: Normal judgment and insight. Alert and oriented x 3. Normal mood and appropriate affect.   Data Reviewed: I have personally reviewed following labs and imaging studies  CBC: Recent Labs  Lab 11/22/17 1908 11/23/17 0112  WBC 7.0 5.2  NEUTROABS 5.2  --   HGB 11.9* 10.4*  HCT 36.1* 32.5*  MCV 85.1 84.6  PLT 233 216   Basic Metabolic Panel: Recent Labs  Lab 11/22/17 1908 11/23/17 0112  NA 134* 133*  K 3.6 4.2  CL 102 106  CO2 17* 14*  GLUCOSE 249* 271*  BUN 55* 52*  CREATININE 2.73* 2.44*  CALCIUM 10.0 8.9   GFR: Estimated Creatinine Clearance: 19.2 mL/min (A) (by C-G formula based on SCr of 2.44 mg/dL (H)). Liver Function Tests: Recent Labs  Lab 11/22/17 1908 11/23/17 0112  AST 480* 372*  ALT 571* 405*  ALKPHOS 2,477* 1,983*  BILITOT 36.0* 29.0*  PROT 7.5 5.6*  ALBUMIN 2.9* 2.3*   Recent Labs  Lab 11/22/17 1908  LIPASE 67*   No results for input(s): AMMONIA in the last 168 hours. Coagulation Profile: Recent Labs  Lab 11/22/17 1908  INR 1.38   Cardiac Enzymes: No results for input(s): CKTOTAL, CKMB, CKMBINDEX, TROPONINI in the last 168 hours. BNP (last 3 results) No  results for input(s): PROBNP in the last 8760 hours. HbA1C: No results for input(s): HGBA1C in the last 72 hours. CBG: Recent Labs  Lab 11/23/17 0753 11/23/17 1157  GLUCAP 252* 71   Lipid Profile: No results for input(s): CHOL, HDL, LDLCALC, TRIG, CHOLHDL, LDLDIRECT in the last 72 hours. Thyroid Function Tests: Recent Labs    11/23/17 0112  TSH 0.949   Anemia Panel: No results for input(s): VITAMINB12, FOLATE, FERRITIN, TIBC, IRON, RETICCTPCT in the last 72 hours. Sepsis Labs: No results for input(s): PROCALCITON, LATICACIDVEN in the last 168 hours.  No results found for this or any previous visit (from the past 240 hour(s)).   Radiology Studies: US Abdomen Complete  Result Date:  11/22/2017 CLINICAL DATA:  Jaundice with weight loss over 4 weeks. EXAM: ABDOMEN ULTRASOUND COMPLETE COMPARISON:  CT 02/13/2017 FINDINGS: Gallbladder: Contracted gallbladder full of stones. Negative sonographic Murphy sign. Wall thickness is 1.6 mm. No adjacent pericholecystic fluid. Largest stone 2.1 cm. Common bile duct: Diameter: 8.9 mm Liver: Increased parenchymal echogenicity without focal mass. Mild central intrahepatic ductal dilatation. Portal vein is patent on color Doppler imaging with normal direction of blood flow towards the liver. IVC: No abnormality visualized. Pancreas: Not visualized. Spleen: Size and appearance within normal limits. Right Kidney: Length: 11.2 cm. Echogenicity within normal limits. 1 cm cyst over the mid pole. No mass or hydronephrosis visualized. Left Kidney: Length: 10.0 cm 1.1 cm cyst over the mid pole. Echogenicity within normal limits. No mass or hydronephrosis visualized. Abdominal aorta: No aneurysm visualized. Other findings: None. IMPRESSION: Contracted gallbladder with moderate cholelithiasis. No additional sonographic to suggest cholecystitis. Mild dilatation of the common bile duct. MRCP may be helpful in this patient with jaundice. Findings compatible with mild hepatic steatosis without focal mass. Small bilateral renal cysts. Electronically Signed   By: Marin Olp M.D.   On: 11/22/2017 21:16   Scheduled Meds: . amLODipine  5 mg Oral Daily  . gabapentin  300 mg Oral BID  . heparin  5,000 Units Subcutaneous Q8H  . [START ON 11/24/2017] Influenza vac split quadrivalent PF  0.5 mL Intramuscular Tomorrow-1000  . insulin aspart  0-15 Units Subcutaneous TID WC  . insulin glargine  16 Units Subcutaneous Daily  . levothyroxine  137 mcg Oral QAC breakfast  . tamsulosin  0.4 mg Oral Daily   Continuous Infusions: . dextrose 5 % and 0.45% NaCl 125 mL/hr at 11/23/17 0810    LOS: 1 day   Kerney Elbe, DO [facility] PAGER is on [place]  If 7PM-7AM,  please contact night-coverage [email or web address] Password [facility] 11/23/2017, 12:45 PM

## 2017-11-24 ENCOUNTER — Inpatient Hospital Stay (HOSPITAL_COMMUNITY): Payer: Medicare Other

## 2017-11-24 ENCOUNTER — Other Ambulatory Visit: Payer: Self-pay

## 2017-11-24 DIAGNOSIS — R17 Unspecified jaundice: Secondary | ICD-10-CM

## 2017-11-24 DIAGNOSIS — R634 Abnormal weight loss: Secondary | ICD-10-CM

## 2017-11-24 DIAGNOSIS — W19XXXA Unspecified fall, initial encounter: Secondary | ICD-10-CM

## 2017-11-24 DIAGNOSIS — R933 Abnormal findings on diagnostic imaging of other parts of digestive tract: Secondary | ICD-10-CM

## 2017-11-24 DIAGNOSIS — R531 Weakness: Secondary | ICD-10-CM

## 2017-11-24 DIAGNOSIS — E876 Hypokalemia: Secondary | ICD-10-CM

## 2017-11-24 DIAGNOSIS — R630 Anorexia: Secondary | ICD-10-CM

## 2017-11-24 LAB — COMPREHENSIVE METABOLIC PANEL
ALBUMIN: 2 g/dL — AB (ref 3.5–5.0)
ALK PHOS: 2089 U/L — AB (ref 38–126)
ALT: 424 U/L — ABNORMAL HIGH (ref 0–44)
ANION GAP: 10 (ref 5–15)
AST: 474 U/L — AB (ref 15–41)
BUN: 48 mg/dL — AB (ref 8–23)
CALCIUM: 8.7 mg/dL — AB (ref 8.9–10.3)
CO2: 16 mmol/L — ABNORMAL LOW (ref 22–32)
Chloride: 106 mmol/L (ref 98–111)
Creatinine, Ser: 2.91 mg/dL — ABNORMAL HIGH (ref 0.61–1.24)
GFR calc Af Amer: 21 mL/min — ABNORMAL LOW (ref >60)
GFR calc non Af Amer: 19 mL/min — ABNORMAL LOW (ref >60)
GLUCOSE: 105 mg/dL — AB (ref 70–99)
POTASSIUM: 3.4 mmol/L — AB (ref 3.5–5.1)
SODIUM: 132 mmol/L — AB (ref 135–145)
Total Bilirubin: 28.7 mg/dL (ref 0.3–1.2)
Total Protein: 5.8 g/dL — ABNORMAL LOW (ref 6.5–8.1)

## 2017-11-24 LAB — GLUCOSE, CAPILLARY
GLUCOSE-CAPILLARY: 93 mg/dL (ref 70–99)
GLUCOSE-CAPILLARY: 104 mg/dL — AB (ref 70–99)
GLUCOSE-CAPILLARY: 191 mg/dL — AB (ref 70–99)
Glucose-Capillary: 96 mg/dL (ref 70–99)

## 2017-11-24 LAB — CBC WITH DIFFERENTIAL/PLATELET
BASOS ABS: 0.1 10*3/uL (ref 0.0–0.1)
Basophils Relative: 1 %
Eosinophils Absolute: 0.3 10*3/uL (ref 0.0–0.7)
Eosinophils Relative: 5 %
HEMATOCRIT: 33.1 % — AB (ref 39.0–52.0)
HEMOGLOBIN: 10.7 g/dL — AB (ref 13.0–17.0)
LYMPHS PCT: 20 %
Lymphs Abs: 1.2 10*3/uL (ref 0.7–4.0)
MCH: 27.3 pg (ref 26.0–34.0)
MCHC: 32.3 g/dL (ref 30.0–36.0)
MCV: 84.4 fL (ref 78.0–100.0)
MONO ABS: 0.5 10*3/uL (ref 0.1–1.0)
Monocytes Relative: 9 %
NEUTROS ABS: 4.1 10*3/uL (ref 1.7–7.7)
NEUTROS PCT: 65 %
Platelets: 203 10*3/uL (ref 150–400)
RBC: 3.92 MIL/uL — AB (ref 4.22–5.81)
RDW: 18 % — ABNORMAL HIGH (ref 11.5–15.5)
WBC: 6.3 10*3/uL (ref 4.0–10.5)

## 2017-11-24 LAB — ANA: Anti Nuclear Antibody(ANA): NEGATIVE

## 2017-11-24 LAB — CEA: CEA: 28.8 ng/mL — ABNORMAL HIGH (ref 0.0–4.7)

## 2017-11-24 LAB — GAMMA GT: GGT: 380 U/L — AB (ref 7–50)

## 2017-11-24 LAB — MAGNESIUM: Magnesium: 1.4 mg/dL — ABNORMAL LOW (ref 1.7–2.4)

## 2017-11-24 LAB — PHOSPHORUS: Phosphorus: 3.3 mg/dL (ref 2.5–4.6)

## 2017-11-24 LAB — MITOCHONDRIAL ANTIBODIES: Mitochondrial M2 Ab, IgG: <20 Units (ref 0.0–20.0)

## 2017-11-24 LAB — ANTI-SMOOTH MUSCLE ANTIBODY, IGG: F-Actin IgG: 10 Units (ref 0–19)

## 2017-11-24 LAB — HEPATITIS PANEL, ACUTE
HCV Ab: <0.1 s/co ratio (ref 0.0–0.9)
HEP B C IGM: NEGATIVE
Hep A IgM: NEGATIVE
Hepatitis B Surface Ag: NEGATIVE

## 2017-11-24 LAB — CANCER ANTIGEN 19-9: CA 19-9: 2645 U/mL — ABNORMAL HIGH (ref 0–35)

## 2017-11-24 MED ORDER — POTASSIUM CHLORIDE 10 MEQ/100ML IV SOLN
10.0000 meq | INTRAVENOUS | Status: AC
  Administered 2017-11-24 (×4): 10 meq via INTRAVENOUS
  Filled 2017-11-24 (×4): qty 100

## 2017-11-24 MED ORDER — POTASSIUM CHLORIDE 10 MEQ/100ML IV SOLN: 10.0000 meq | INTRAVENOUS | Status: DC

## 2017-11-24 MED ORDER — MAGNESIUM SULFATE 50 % IJ SOLN
3.0000 g | Freq: Once | INTRAVENOUS | Status: AC
  Administered 2017-11-24: 3 g via INTRAVENOUS
  Filled 2017-11-24: qty 6

## 2017-11-24 MED ORDER — ENSURE ENLIVE PO LIQD
237.0000 mL | Freq: Two times a day (BID) | ORAL | Status: DC
  Administered 2017-11-25 – 2017-11-29 (×5): 237 mL via ORAL

## 2017-11-24 MED ORDER — PIPERACILLIN-TAZOBACTAM IN DEX 2-0.25 GM/50ML IV SOLN
2.2500 g | Freq: Three times a day (TID) | INTRAVENOUS | Status: DC
  Administered 2017-11-24 – 2017-12-01 (×22): 2.25 g via INTRAVENOUS
  Filled 2017-11-24 (×25): qty 50

## 2017-11-24 MED ORDER — SODIUM BICARBONATE 8.4 % IV SOLN
INTRAVENOUS | Status: DC
  Administered 2017-11-24 – 2017-11-25 (×3): via INTRAVENOUS
  Filled 2017-11-24 (×3): qty 150

## 2017-11-24 NOTE — Progress Notes (Signed)
PROGRESS NOTE    Zachary Cook  HWE:[id number] DOB: 1932-11-10 DOA: 11/22/2017 PCP: [name], MD   Brief Narrative:  HPI per Dr. Laurey Arrow 11/22/17 Zachary Cook is a 84 y.o. male with medical history significant for hypothyroid, htn, gi bleed (diverticulosis, s/p partial colectomy), incisional hernia repair 07/2017, T2DM, who presents with above.  Symptoms began 5 wks ago and have progressively worsened. Began feeling fatigued, then at times dizzy when ambulating. Then developed nausea, worse with eating, with vomiting after most meals. PO intake decreased, says has lost 30-40 pounds. Began to develop jaundice about a week ago. Says was evaluated by PCP early in this course and says he thinks pcp wasn't much concerned, sounds like symptoms mild at that time. Also had an episode of white/tan stools that have resolved. No fevers. No abdominal pain. No diarrhea, no blood in stool. No new meds. Not a drinker. No history liver or gallbladder problems. No history cancer.  **MRCP was ordered and Gastroenterology was consulted for further evaluation and recommendations. MRCP still awaiting to be done! This AM patient had an unwitnessed fall in the bathroom and hit his head. Head CT was ordered was negative for any intracranial abnormality and there is evidence for chronic microvascular ischemic disease and associated intracranial atherosclerosis.  Assessment & Plan:   Active Problems:   Type 2 diabetes mellitus (HCC)   Hypertension   AKI (acute kidney injury) ([place])   S/P hernia repair   Biliary obstruction   Elevated transaminase level   Jaundice of recent onset   Hyperbilirubinemia   Elevated alkaline phosphatase level  Severe Painless Jaundice and Hyperbilirubinemia along with elevated LFTs and Alk Phos ? Cancer (most likely) vs. Biliary Obstruction -Admitted to Inpatient -Has history of nausea, vomiting and poor p.o. intake with 30 to 40 pound weight loss over the last 5  weeks -Alk phos on admission was 2477 is now trended down to [place] now is back up to 2000 0 89 -Total bilirubin on admission was 36.0 -> 29.0 -> 28.7 -AST went from 480 and trended down to 372 and is now back up to 474 -ALT went from 571 to 405 and is now 474 -Denies any Abdominal Pain -Check CA-19-9 and CEA; done in both of them were grossly abnormal.  CA-19-9 was 2645, and CEA was 28.8 -ANA was negative antimitochondrial antibody was less than 20.0 -U/S abdomen showed contracted gallbladder with moderate cholelithiasis.  No additional sonographic evidence to suggest cholecystitis.  There is mild dilatation of the common bile duct.  There is also findings with mild hepatic steatosis without focal mass and small bilateral renal cysts -Gastroenterology consulted for further evaluation recommendations -Ordered MRCP for further evaluation and still pending; GI states patient will likely require ERCP -Gastroenterology concerned the patient may be having cholangitis so they have added IV Zosyn -Gastroenterology started patient on Hydroxyzine 10 mg p.o. every 6 PRN for itching  AKI on CKD Stage 3 -Patient's BUN/creatinine on admission was 55/2.73 and then trended down to 52/2.44 however now trended back up to 48/2.91 in the setting of poor p.o. intake -Likely in the setting of nausea, vomiting and poor p.o. intake -1 L normal saline bolus and then started on D5 half-normal saline at rate of 125 mL's per hour; this is been discontinued and patient was started on a bicarb drip and D5W at 100 mL's per hour -Avoid nephrotoxic medications possible -Repeat CMP in a.m.  Diabetes Mellitus Type 2 -Holding Home metformin XR thousand  milligrams daily, Victoza 1.2 mg subcu, along with glimepiride 2 mg nightly -Resume Home Insulin and will start the patient on 16 units of Lantus daily history of home regimen of 20 units -Started on Moderate Novolog SSI AC -Last HbA1c was 7.4 back in June; will repeat  hemoglobin A1c here -CBG's ranging from 71-104 -Hypoglycemic Protocol   Essential Hypertension -BP is currently at Goal 122/74 -Continue Home Amlodipine 5 mg po Daily   HLD -LFTs are currently elevated so we will hold his home Atorvastatin  GERD -Currently not taking Any acid suppressing medications  BPH -C/w Tamsulosin 0.4 mg p.o. daily  Hypothyroidism -TSH was 949 -Continue with Levothyroxine 137 mcg p.o. daily before breakfast  Peripheral Neuropathy -Continue home Gabapentin 300 mill grams p.o. twice daily  Metabolic Acidosis -CO2 was 16 -Have started the patient on a bicarbonate drip -Repeat CMP in a.m.  Hypomagnesemia -Patient magnesium levels 1.4 -Replete with IV mag sulfate 3 g -Continue monitor and replete as necessary -Repeat magnesium level in a.m.  Hypokalemia -Patient's potassium was 3.4 -Patient is currently n.p.o. so we will replete with IV KCl 40 mEq -Continue to monitor and replete as necessary -Repeat CMP in the a.m.  Fall -Unwitnessed in the bathroom -Ordered head CT and was negative for any acute intracranial abnormalities  DVT prophylaxis: Heparin 5,000 sq q8h Code Status: DO NOT RESUSCITATE Family Communication: No family present at bedside Disposition Plan: Remain Inpatient for further workup and treatment  Consultants:   Gastroenterology    Procedures: None  Antimicrobials:  Anti-infectives (From admission, onward)   Start     Dose/Rate Route Frequency Ordered Stop   11/24/17 1400  piperacillin-tazobactam (ZOSYN) IVPB 2.25 g     2.25 g 100 mL/hr over 30 Minutes Intravenous Every 8 hours 11/24/17 1043       Subjective: Seen and examined at bedside and states that he was extremely tired and fatigued. States that his legs gave out in the bathroom trying to get up from the toilet. No CP or SOB. Does not have a very good appetite.   Objective: Vitals:   11/23/17 2132 11/24/17 0542 11/24/17 1016 11/24/17 1348  BP: (!) 142/70  105/73 120/62 122/74  Pulse: (!) 59 68 (!) 57 (!) 59  Resp: '13 14 16 16  '$ Temp: 98.5 F (36.9 C) 98.1 F (36.7 C) 97.6 F (36.4 C) 97.8 F (36.6 C)  TempSrc: Oral Oral Oral Oral  SpO2: 99% 96% 99% 99%  Weight:      Height:        Intake/Output Summary (Last 24 hours) at 11/24/2017 1741 Last data filed at 11/24/2017 1545 Gross per 24 hour  Intake 2452.63 ml  Output 975 ml  Net 1477.63 ml   Filed Weights   11/22/17 1859  Weight: 66.7 kg   Examination: Physical Exam:  Constitutional: Well nourished, well-developed Caucasian male is currently resting in bed appears calm but very fatigued Eyes: Conjunctive normal.  Sclerae severely icteric ENMT: External ears and nose appear normal.  Grossly normal hearing Neck: Supple with no JVD Respiratory: Diminished to auscultation bilaterally no appreciable wheezing, rales, rhonchi.  Patient is not tachypneic using accessory muscle breathe Cardiovascular: Regular rate and rhythm.  No appreciable murmurs, rubs, gallops.  S1-S2 auscultated no appreciable lower extremity edema Abdomen: Soft, nontender, slightly distended.  Bowel sounds present in 4 quadrants has a large scar from prior surgery GU: Deferred Musculoskeletal: No clubbing or cyanosis.  Diminished strength Skin: Skin is warm and dry however he has significant  yellowing and jaundiced hue of his skin.  Has a mild head laceration from fall Neurologic: Cranial nerves II through XII grossly intact no appreciable focal deficits Psychiatric: Normal judgment and insight.  Patient is awake, alert and oriented however is slight somewhat sleepy.  Flat affect and slightly depressed appearing  Data Reviewed: I have personally reviewed following labs and imaging studies  CBC: Recent Labs  Lab 11/22/17 1908 11/23/17 0112 11/24/17 0822  WBC 7.0 5.2 6.3  NEUTROABS 5.2  --  4.1  HGB 11.9* 10.4* 10.7*  HCT 36.1* 32.5* 33.1*  MCV 85.1 84.6 84.4  PLT 233 219 517   Basic Metabolic  Panel: Recent Labs  Lab 11/22/17 1908 11/23/17 0112 11/24/17 0822  NA 134* 133* 132*  K 3.6 4.2 3.4*  CL 102 106 106  CO2 17* 14* 16*  GLUCOSE 249* 271* 105*  BUN 55* 52* 48*  CREATININE 2.73* 2.44* 2.91*  CALCIUM 10.0 8.9 8.7*  MG  --   --  1.4*  PHOS  --   --  3.3   GFR: Estimated Creatinine Clearance: 16.1 mL/min (A) (by C-G formula based on SCr of 2.91 mg/dL (H)). Liver Function Tests: Recent Labs  Lab 11/22/17 1908 11/23/17 0112 11/24/17 0822  AST 480* 372* 474*  ALT 571* 405* 424*  ALKPHOS 2,477* 1,983* 2,089*  BILITOT 36.0* 29.0* 28.7*  PROT 7.5 5.6* 5.8*  ALBUMIN 2.9* 2.3* 2.0*   Recent Labs  Lab 11/22/17 1908  LIPASE 67*   No results for input(s): AMMONIA in the last 168 hours. Coagulation Profile: Recent Labs  Lab 11/22/17 1908  INR 1.38   Cardiac Enzymes: No results for input(s): CKTOTAL, CKMB, CKMBINDEX, TROPONINI in the last 168 hours. BNP (last 3 results) No results for input(s): PROBNP in the last 8760 hours. HbA1C: No results for input(s): HGBA1C in the last 72 hours. CBG: Recent Labs  Lab 11/23/17 1655 11/23/17 2140 11/24/17 0804 11/24/17 1313 11/24/17 1701  GLUCAP 74 71 93 104* 96   Lipid Profile: No results for input(s): CHOL, HDL, LDLCALC, TRIG, CHOLHDL, LDLDIRECT in the last 72 hours. Thyroid Function Tests: Recent Labs    11/23/17 0112  TSH 0.949   Anemia Panel: No results for input(s): VITAMINB12, FOLATE, FERRITIN, TIBC, IRON, RETICCTPCT in the last 72 hours. Sepsis Labs: No results for input(s): PROCALCITON, LATICACIDVEN in the last 168 hours.  No results found for this or any previous visit (from the past 240 hour(s)).   Radiology Studies: Ct Head Wo Contrast  Result Date: 11/24/2017 CLINICAL DATA:  84 year old male with a history of fall and weakness EXAM: CT HEAD WITHOUT CONTRAST TECHNIQUE: Contiguous axial images were obtained from the base of the skull through the vertex without intravenous contrast.  COMPARISON:  None. FINDINGS: Brain: No acute intracranial hemorrhage. No midline shift or mass effect. Gray-white differentiation maintained. Mild cerebral volume loss. Unremarkable appearance of the ventricle configuration. Hypodensity in the bilateral basal ganglia. Vascular: Calcifications of the intracranial vasculature. Skull: No acute displaced fracture. No scalp hematoma. No radiopaque foreign body. Sinuses/Orbits: No acute finding. Other: None. IMPRESSION: Negative for acute intracranial abnormality. Evidence of chronic microvascular ischemic disease and associated intracranial atherosclerosis. Electronically Signed   By: Corrie Mckusick D.O.   On: 11/24/2017 11:36   US Abdomen Complete  Result Date: 11/22/2017 CLINICAL DATA:  Jaundice with weight loss over 4 weeks. EXAM: ABDOMEN ULTRASOUND COMPLETE COMPARISON:  CT 02/13/2017 FINDINGS: Gallbladder: Contracted gallbladder full of stones. Negative sonographic Murphy sign. Wall thickness is 1.6 mm. No  adjacent pericholecystic fluid. Largest stone 2.1 cm. Common bile duct: Diameter: 8.9 mm Liver: Increased parenchymal echogenicity without focal mass. Mild central intrahepatic ductal dilatation. Portal vein is patent on color Doppler imaging with normal direction of blood flow towards the liver. IVC: No abnormality visualized. Pancreas: Not visualized. Spleen: Size and appearance within normal limits. Right Kidney: Length: 11.2 cm. Echogenicity within normal limits. 1 cm cyst over the mid pole. No mass or hydronephrosis visualized. Left Kidney: Length: 10.0 cm 1.1 cm cyst over the mid pole. Echogenicity within normal limits. No mass or hydronephrosis visualized. Abdominal aorta: No aneurysm visualized. Other findings: None. IMPRESSION: Contracted gallbladder with moderate cholelithiasis. No additional sonographic to suggest cholecystitis. Mild dilatation of the common bile duct. MRCP may be helpful in this patient with jaundice. Findings compatible with mild  hepatic steatosis without focal mass. Small bilateral renal cysts. Electronically Signed   By: Marin Olp M.D.   On: 11/22/2017 21:16   Scheduled Meds: . amLODipine  5 mg Oral Daily  . feeding supplement (ENSURE ENLIVE)  237 mL Oral BID BM  . gabapentin  300 mg Oral BID  . heparin  5,000 Units Subcutaneous Q8H  . Influenza vac split quadrivalent PF  0.5 mL Intramuscular Tomorrow-1000  . insulin aspart  0-15 Units Subcutaneous TID WC  . insulin glargine  16 Units Subcutaneous Daily  . levothyroxine  137 mcg Oral QAC breakfast  . tamsulosin  0.4 mg Oral Daily   Continuous Infusions: . piperacillin-tazobactam (ZOSYN)  IV Stopped (11/24/17 1403)  . potassium chloride 10 mEq (11/24/17 1640)  .  sodium bicarbonate  infusion 1000 mL 100 mL/hr at 11/24/17 1330    LOS: 2 days   Kerney Elbe, DO [facility] PAGER is on [facility]  If 7PM-7AM, please contact night-coverage [email or web address] Password [facility] 11/24/2017, 5:41 PM

## 2017-11-24 NOTE — Progress Notes (Signed)
   11/24/17 0640  What Happened  Was fall witnessed? No  Was patient injured? Yes (1/4 in cut on top of head from IV pump corner)  Patient found in bathroom  Found by Staff-comment Gara Kroner and Senaida Ores)  Stated prior activity bathroom-unassisted (nurses outside the bathroom, changing bed.  Door ajar)  Follow Up  MD notified Texted [place], Hospitalist PA  Time MD notified [phone]  Family notified No- patient refusal  Additional tests No  Simple treatment Other (comment) ([place])  Pain Assessment  Pain Scale 0-10  Pain Score 0  Neurological  Neuro (WDL) WDL  Level of Consciousness Alert  Orientation Level Oriented X4  Cognition Appropriate at baseline  Speech Clear  R Hand Grip Moderate  L Hand Grip Moderate   RLE Motor Strength 4  LLE Motor Strength 4

## 2017-11-24 NOTE — Plan of Care (Signed)
Pt alert and oriented, some slow responses, very sleepy this am, but more awake this afternoon.  Awaiting pt to go for MRCP.  RN will monitor.

## 2017-11-24 NOTE — Progress Notes (Signed)
Initial Nutrition Assessment  DOCUMENTATION CODES:   Non-severe (moderate) malnutrition in context of chronic illness  INTERVENTION:   Diet advancement per MD Once diet advanced, provide Ensure Enlive po BID, each supplement provides 350 kcal and 20 grams of protein  NUTRITION DIAGNOSIS:   Moderate Malnutrition related to chronic illness(biliary obstruction) as evidenced by percent weight loss, moderate fat depletion, moderate muscle depletion.  GOAL:   Patient will meet greater than or equal to 90% of their needs  MONITOR:   Diet advancement, Labs, Weight trends, I & O's  REASON FOR ASSESSMENT:   Malnutrition Screening Tool    ASSESSMENT:   84 y.o. male with medical history significant for hypothyroid, htn, gi bleed (diverticulosis, s/p partial colectomy), incisional hernia repair 07/2017, T2DM  Patient awaiting MRCP procedure. Currently NPO then will be on a full liquid diet following procedure. Pt fell this morning. Per chart review, pt has had N/V for the past 5 weeks with meal consumption.   Per weight records, pt has lost 28 lb since 5/17 (16% wt loss x 4 months, significant for time frame).  Medications:IV  Mg sulfate once, IV KCl,   Labs reviewed: Low Na, K, Mg Phos WNL GFR:  19  NUTRITION - FOCUSED PHYSICAL EXAM:    Most Recent Value  Orbital Region  No depletion  Upper Arm Region  Moderate depletion  Thoracic and Lumbar Region  Unable to assess  Buccal Region  No depletion  Temple Region  No depletion  Clavicle Bone Region  Moderate depletion  Clavicle and Acromion Bone Region  Moderate depletion  Scapular Bone Region  Moderate depletion  Dorsal Hand  Moderate depletion  Patellar Region  Moderate depletion  Anterior Thigh Region  No depletion  Posterior Calf Region  No depletion  Edema (RD Assessment)  None       Diet Order:   Diet Order            Diet full liquid Room service appropriate? Yes; Fluid consistency: Thin  Diet effective now        Diet NPO time specified  Diet effective midnight              EDUCATION NEEDS:   No education needs have been identified at this time  Skin:  Skin Assessment: Reviewed RN Assessment  Last BM:  9/16  Height:   Ht Readings from Last 1 Encounters:  11/22/17 5\' 4"  (1.626 m)    Weight:   Wt Readings from Last 1 Encounters:  11/22/17 66.7 kg    Ideal Body Weight:  59.1 kg  BMI:  Body mass index is 25.23 kg/m.  Estimated Nutritional Needs:   Kcal:  1600-1800  Protein:  80-90g  Fluid:  1.8L/day  Clayton Bibles, MS, RD, LDN [facility] Dietitian Pager: [phone] After Hours Pager: [phone]

## 2017-11-24 NOTE — Progress Notes (Addendum)
Patient ID: Zachary Cook, male   DOB: 10-Nov-1932, 84 y.o.   MRN: [id number]    Progress Note   Subjective    Day # 3 hospital stay  Very weak , fell this am trying to get to bathroom   No appetite , some nausea, no abdominal  Pain  CEA, CA19-9 wnl   Objective   Vital signs in last 24 hours: Temp:  [97.6 F (36.4 C)-98.5 F (36.9 C)] 97.6 F (36.4 C) (09/16 1016) Pulse Rate:  [57-68] 57 (09/16 1016) Resp:  [13-16] 16 (09/16 1016) BP: (105-142)/(62-75) 120/62 (09/16 1016) SpO2:  [96 %-99 %] 99 % (09/16 1016) Last BM Date: 11/24/17 General: Elderly WM in NAD  extremely jaundiced , ill appearing, frail Heart:  Regular rate and rhythm; no murmurs Lungs: Respirations even and unlabored, lungs CTA bilaterally Abdomen:  Soft, nontender and nondistended. Normal bowel sounds. Extremities:  Without edema. Neurologic:  Alert and oriented,  grossly normal neurologically. Psych:  Cooperative. Normal mood and affect.  Intake/Output from previous day: 09/15 0701 - 09/16 0700 In: 3434.9 [I.V.:3434.9] Out: 1125 [Urine:1125] Intake/Output this shift: Total I/O In: 120 [P.O.:120] Out: 100 [Urine:100]  Lab Results: Recent Labs    11/22/17 1908 11/23/17 0112 11/24/17 0822  WBC 7.0 5.2 6.3  HGB 11.9* 10.4* 10.7*  HCT 36.1* 32.5* 33.1*  PLT 233 219 203   BMET Recent Labs    11/22/17 1908 11/23/17 0112 11/24/17 0822  NA 134* 133* 132*  K 3.6 4.2 3.4*  CL 102 106 106  CO2 17* 14* 16*  GLUCOSE 249* 271* 105*  BUN 55* 52* 48*  CREATININE 2.73* 2.44* 2.91*  CALCIUM 10.0 8.9 8.7*   LFT Recent Labs    11/24/17 0822  PROT 5.8*  ALBUMIN 2.0*  AST 474*  ALT 424*  ALKPHOS 2,089*  BILITOT 28.7*   PT/INR Recent Labs    11/22/17 1908  LABPROT 16.9*  INR 1.38    Studies/Results: US Abdomen Complete  Result Date: 11/22/2017 CLINICAL DATA:  Jaundice with weight loss over 4 weeks. EXAM: ABDOMEN ULTRASOUND COMPLETE COMPARISON:  CT 02/13/2017 FINDINGS: Gallbladder:  Contracted gallbladder full of stones. Negative sonographic Murphy sign. Wall thickness is 1.6 mm. No adjacent pericholecystic fluid. Largest stone 2.1 cm. Common bile duct: Diameter: 8.9 mm Liver: Increased parenchymal echogenicity without focal mass. Mild central intrahepatic ductal dilatation. Portal vein is patent on color Doppler imaging with normal direction of blood flow towards the liver. IVC: No abnormality visualized. Pancreas: Not visualized. Spleen: Size and appearance within normal limits. Right Kidney: Length: 11.2 cm. Echogenicity within normal limits. 1 cm cyst over the mid pole. No mass or hydronephrosis visualized. Left Kidney: Length: 10.0 cm 1.1 cm cyst over the mid pole. Echogenicity within normal limits. No mass or hydronephrosis visualized. Abdominal aorta: No aneurysm visualized. Other findings: None. IMPRESSION: Contracted gallbladder with moderate cholelithiasis. No additional sonographic to suggest cholecystitis. Mild dilatation of the common bile duct. MRCP may be helpful in this patient with jaundice. Findings compatible with mild hepatic steatosis without focal mass. Small bilateral renal cysts. Electronically Signed   By: Marin Olp M.D.   On: 11/22/2017 21:16       Assessment / Plan:     #1 84 yo WM with painless jaundice, poor appetite, weakness, and weight loss of 30+ pounds over the past couple of months.  Ultrasound on admission shows cholelithiasis, and common bile duct of 8.9 mm.  WBC was normal on admit, and he has been afebrile, however  relates having episodes of chills at home over the past couple of months without documented fever sweats.  MRCP has been ordered and is pending this morning. Concern is for choledocholithiasis, versus occult malignancy with biliary obstruction.  He has had progressive weakness, concerned he may be developing cholangitis  #2 chronic kidney disease-creatinine 2.9 #3 history of major lower GI bleed 2018   Plan; await MRCP  today, will likely require ERCP tomorrow with Dr. Carlean Purl Please continue IV fluids Full liquids post MRCP Will add Zosyn IV On subcu heparin which will need to be stopped pre-procedures.      Contact  Amy [place], P.A.-C               [phone]      Active Problems:   Type 2 diabetes mellitus ([place])   Hypertension   AKI (acute kidney injury) ([place])   S/P hernia repair   Biliary obstruction   Elevated transaminase level   Jaundice of recent onset   Hyperbilirubinemia   Elevated alkaline phosphatase level     LOS: 2 days   Amy Esterwood  11/24/2017, 10:27 AM

## 2017-11-25 ENCOUNTER — Encounter: Payer: Self-pay | Admitting: Internal Medicine

## 2017-11-25 DIAGNOSIS — R112 Nausea with vomiting, unspecified: Secondary | ICD-10-CM

## 2017-11-25 DIAGNOSIS — Z7401 Bed confinement status: Secondary | ICD-10-CM

## 2017-11-25 DIAGNOSIS — R5383 Other fatigue: Secondary | ICD-10-CM

## 2017-11-25 DIAGNOSIS — Z6837 Body mass index (BMI) 37.0-37.9, adult: Secondary | ICD-10-CM

## 2017-11-25 DIAGNOSIS — R634 Abnormal weight loss: Secondary | ICD-10-CM

## 2017-11-25 DIAGNOSIS — K831 Obstruction of bile duct: Secondary | ICD-10-CM

## 2017-11-25 DIAGNOSIS — K8689 Other specified diseases of pancreas: Secondary | ICD-10-CM

## 2017-11-25 LAB — COMPREHENSIVE METABOLIC PANEL
ALBUMIN: 1.9 g/dL — AB (ref 3.5–5.0)
ALT: 377 U/L — ABNORMAL HIGH (ref 0–44)
ANION GAP: 12 (ref 5–15)
AST: 419 U/L — ABNORMAL HIGH (ref 15–41)
Alkaline Phosphatase: 2000 U/L — ABNORMAL HIGH (ref 38–126)
BUN: 44 mg/dL — AB (ref 8–23)
CO2: 18 mmol/L — ABNORMAL LOW (ref 22–32)
Calcium: 8.8 mg/dL — ABNORMAL LOW (ref 8.9–10.3)
Chloride: 103 mmol/L (ref 98–111)
Creatinine, Ser: 2.98 mg/dL — ABNORMAL HIGH (ref 0.61–1.24)
GFR calc Af Amer: 21 mL/min — ABNORMAL LOW (ref >60)
GFR, EST NON AFRICAN AMERICAN: 18 mL/min — AB (ref >60)
Glucose, Bld: 196 mg/dL — ABNORMAL HIGH (ref 70–99)
POTASSIUM: 3.6 mmol/L (ref 3.5–5.1)
Sodium: 133 mmol/L — ABNORMAL LOW (ref 135–145)
Total Bilirubin: 28.9 mg/dL (ref 0.3–1.2)
Total Protein: 5.5 g/dL — ABNORMAL LOW (ref 6.5–8.1)

## 2017-11-25 LAB — CBC WITH DIFFERENTIAL/PLATELET
BASOS PCT: 1 %
Basophils Absolute: 0.1 10*3/uL (ref 0.0–0.1)
EOS PCT: 4 %
Eosinophils Absolute: 0.3 10*3/uL (ref 0.0–0.7)
HCT: 31 % — ABNORMAL LOW (ref 39.0–52.0)
Hemoglobin: 10.1 g/dL — ABNORMAL LOW (ref 13.0–17.0)
LYMPHS ABS: 1.5 10*3/uL (ref 0.7–4.0)
Lymphocytes Relative: 21 %
MCH: 27.2 pg (ref 26.0–34.0)
MCHC: 32.6 g/dL (ref 30.0–36.0)
MCV: 83.3 fL (ref 78.0–100.0)
Monocytes Absolute: 0.4 10*3/uL (ref 0.1–1.0)
Monocytes Relative: 6 %
NEUTROS PCT: 68 %
Neutro Abs: 4.7 10*3/uL (ref 1.7–7.7)
PLATELETS: 205 10*3/uL (ref 150–400)
RBC: 3.72 MIL/uL — ABNORMAL LOW (ref 4.22–5.81)
RDW: 18.2 % — AB (ref 11.5–15.5)
WBC: 7 10*3/uL (ref 4.0–10.5)

## 2017-11-25 LAB — MAGNESIUM: MAGNESIUM: 2 mg/dL (ref 1.7–2.4)

## 2017-11-25 LAB — GLUCOSE, CAPILLARY
GLUCOSE-CAPILLARY: 150 mg/dL — AB (ref 70–99)
GLUCOSE-CAPILLARY: 152 mg/dL — AB (ref 70–99)
GLUCOSE-CAPILLARY: 164 mg/dL — AB (ref 70–99)
Glucose-Capillary: 42 mg/dL — CL (ref 70–99)
Glucose-Capillary: 64 mg/dL — ABNORMAL LOW (ref 70–99)

## 2017-11-25 LAB — LIPASE, BLOOD: LIPASE: 51 U/L (ref 11–51)

## 2017-11-25 LAB — PHOSPHORUS: Phosphorus: 3 mg/dL (ref 2.5–4.6)

## 2017-11-25 MED ORDER — LACTATED RINGERS IV SOLN
INTRAVENOUS | Status: DC
  Administered 2017-11-25 – 2017-11-29 (×4): via INTRAVENOUS

## 2017-11-25 MED ORDER — FUROSEMIDE 10 MG/ML IJ SOLN
40.0000 mg | Freq: Once | INTRAMUSCULAR | Status: AC
  Administered 2017-11-25: 40 mg via INTRAVENOUS
  Filled 2017-11-25: qty 4

## 2017-11-25 MED ORDER — INDOMETHACIN 50 MG RE SUPP
100.0000 mg | Freq: Once | RECTAL | Status: AC
  Administered 2017-11-25: 100 mg via RECTAL
  Filled 2017-11-25: qty 2

## 2017-11-25 MED ORDER — SODIUM CHLORIDE 0.9 % IV BOLUS
500.0000 mL | Freq: Once | INTRAVENOUS | Status: AC
  Administered 2017-11-25: 500 mL via INTRAVENOUS

## 2017-11-25 NOTE — Progress Notes (Addendum)
Daily Rounding Note  11/25/2017, 1:49 PM  LOS: 3 days   SUBJECTIVE:   Chief complaint: jaundice, malaise    Still feeling poorly.  No appetite.  CT head following unwitnessed fall is negative for trauma.    OBJECTIVE:         Vital signs in last 24 hours:    Temp:  [97.8 F (36.6 C)-99.4 F (37.4 C)] 99.3 F (37.4 C) (09/17 1125) Pulse Rate:  [68-75] 68 (09/17 1125) Resp:  [15-20] 18 (09/17 1125) BP: (107-138)/(75-84) 121/80 (09/17 1125) SpO2:  [87 %-98 %] 97 % (09/17 1125) Weight:  [73.4 kg] 73.4 kg (09/17 1125) Last BM Date: 11/24/17 Filed Weights   11/22/17 1859 11/25/17 1125  Weight: 66.7 kg 73.4 kg   General: jaundiced, looks tired   Heart: RRR Chest: clear bil.   Abdomen: soft, NT, ND.  BS hypoactive  Extremities: no CCE.   Neuro/Psych:  Alert, follows commands.  [place], wife doing most of the talking and questioning.    Intake/Output from previous day: 09/16 0701 - 09/17 0700 In: 1756.2 [P.O.:120; I.V.:1036.2; IV Piggyback:600] Out: 650 [Urine:650]  Intake/Output this shift: Total I/O In: 813.5 [P.O.:240; I.V.:523.5; IV Piggyback:50] Out: 500 [Urine:500]  Lab Results: Recent Labs    11/23/17 0112 11/24/17 0822 11/25/17 0526  WBC 5.2 6.3 7.0  HGB 10.4* 10.7* 10.1*  HCT 32.5* 33.1* 31.0*  PLT 219 203 205   BMET Recent Labs    11/23/17 0112 11/24/17 0822 11/25/17 0526  NA 133* 132* 133*  K 4.2 3.4* 3.6  CL 106 106 103  CO2 14* 16* 18*  GLUCOSE 271* 105* 196*  BUN 52* 48* 44*  CREATININE 2.44* 2.91* 2.98*  CALCIUM 8.9 8.7* 8.8*   LFT Recent Labs    11/23/17 0112 11/24/17 0822 11/25/17 0526  PROT 5.6* 5.8* 5.5*  ALBUMIN 2.3* 2.0* 1.9*  AST 372* 474* 419*  ALT 405* 424* 377*  ALKPHOS 1,983* 2,089* 2,000*  BILITOT 29.0* 28.7* 28.9*   PT/INR Recent Labs    11/22/17 1908  LABPROT 16.9*  INR 1.38   Hepatitis Panel Recent Labs    11/22/17 2123  HEPBSAG Negative    HCVAB <0.1  HEPAIGM Negative  HEPBIGM Negative    Studies/Results: Ct Head Wo Contrast  Result Date: 11/24/2017 CLINICAL DATA:  84 year old male with a history of fall and weakness EXAM: CT HEAD WITHOUT CONTRAST TECHNIQUE: Contiguous axial images were obtained from the base of the skull through the vertex without intravenous contrast. COMPARISON:  None. FINDINGS: Brain: No acute intracranial hemorrhage. No midline shift or mass effect. Gray-white differentiation maintained. Mild cerebral volume loss. Unremarkable appearance of the ventricle configuration. Hypodensity in the bilateral basal ganglia. Vascular: Calcifications of the intracranial vasculature. Skull: No acute displaced fracture. No scalp hematoma. No radiopaque foreign body. Sinuses/Orbits: No acute finding. Other: None. IMPRESSION: Negative for acute intracranial abnormality. Evidence of chronic microvascular ischemic disease and associated intracranial atherosclerosis. Electronically Signed   By: Corrie Mckusick D.O.   On: 11/24/2017 11:36   Mr Abdomen Mrcp Wo Contrast Mr 3d Recon At Scanner  Result Date: 11/24/2017 CLINICAL DATA:  Jaundice and abdominal pain. Cholelithiasis. Dilated common bile duct on ultrasound. EXAM: MRI ABDOMEN WITHOUT CONTRAST  (INCLUDING MRCP) TECHNIQUE: Multiplanar multisequence MR imaging of the abdomen was performed. Heavily T2-weighted images of the biliary and pancreatic ducts were obtained, and three-dimensional MRCP images were rendered by post processing. COMPARISON:  Multiple exams, including ultrasound of 11/22/2017 and  CT scan from 02/13/2017 FINDINGS: Despite efforts by the technologist and patient, motion artifact is present on today's exam and could not be eliminated. This reduces exam sensitivity and specificity. Lower chest: Moderate cardiomegaly. Interstitial accentuation in the lung bases favoring interstitial edema. There is likely some mild airspace opacity medially in the right lower lobe.  Hepatobiliary: There is abrupt truncation of the dilated common bile duct in the pancreatic head. CBD proximal to this measures 1.6 cm in diameter, and back on the CT exam from 02/13/2017 measured only about 3 mm in diameter. There is mild beading of the extrahepatic biliary tree and notable intrahepatic biliary dilatation as well. The gallbladder is contracted around several gallstones. The stones fill the contracted gallbladder. I do not see a stone in the common bile duct. Pancreas: There is new and abnormal dilation of the dorsal pancreatic duct up to 1.3 cm in diameter. This terminates abruptly in the pancreatic head in the same vicinity as the common bile duct. The entire dorsal pancreatic duct is dilated. Pancreatic atrophy noted. On diffusion-weighted imaging such as image 63/4, there appears to be restricted diffusion in the portion of the pancreatic head associated with truncation of the dorsal pancreatic duct and common bile duct. There is also abnormal fullness of this portion of the pancreatic head for example on image 35/3. Spleen: The spleen appears unremarkable but there is perisplenic ascites. Adrenals/Urinary Tract: There is scarring of the left mid to upper kidney laterally. Adrenal glands normal. Stomach/Bowel: Small type 1 hiatal hernia. Vascular/Lymphatic:  Aortoiliac atherosclerotic vascular disease. Other:  Mild perihepatic and perisplenic ascites. Musculoskeletal: Unremarkable IMPRESSION: 1. New prominent dilatation of the biliary tree and of the dorsal pancreatic duct, probably terminating in the pancreatic head where there appears to be a focus of restricted diffusion and soft tissue prominence. Although we do not have confirmatory postcontrast imaging, and although today's images are somewhat adversely affected by motion artifact, the appearance is considered highly concerning for the possibility of adenocarcinoma of the pancreatic head. I note that the patient's creatinine is very  elevated and accordingly postcontrast imaging may not be feasible. Endoscopic ultrasound might be an option to further characterize the pancreatic head. 2. There are multiple gallstones filling the gallbladder, but the truncation in the common bile duct does not appear to have the signal characteristics of these gallstones. 3. Moderate cardiomegaly with interstitial accentuation in the lung bases favoring interstitial edema. There is likely some airspace opacity medially in the right lower lobe. 4. Mild upper abdominal ascites. 5. Scarring of the left kidney. 6. Small type 1 hiatal hernia. 7.  Aortic Atherosclerosis (ICD10-I70.0). Electronically Signed   By: Van Clines M.D.   On: 11/24/2017 21:18   Scheduled Meds: . amLODipine  5 mg Oral Daily  . feeding supplement (ENSURE ENLIVE)  237 mL Oral BID BM  . gabapentin  300 mg Oral BID  . heparin  5,000 Units Subcutaneous Q8H  . Influenza vac split quadrivalent PF  0.5 mL Intramuscular Tomorrow-1000  . insulin aspart  0-15 Units Subcutaneous TID WC  . insulin glargine  16 Units Subcutaneous Daily  . levothyroxine  137 mcg Oral QAC breakfast  . tamsulosin  0.4 mg Oral Daily   Continuous Infusions: . piperacillin-tazobactam (ZOSYN)  IV Stopped (11/25/17 3267)  .  sodium bicarbonate  infusion 1000 mL 100 mL/hr at 11/25/17 1000   PRN Meds:.hydrOXYzine, ondansetron **OR** ondansetron (ZOFRAN) IV  ASSESMENT:   *   Painless jaundice, anorexia, >/+30 # weight loss MRCP  with dilated biliary ducts and dorsal PD.  Soft tissue abnormality in HOP.  Findings concerning for pancreatic cancer.  Gall stones.  CBD truncation not c/w choledocholithiasis.  Upper abdominal ascites.   CEA is 2645, which is highly suspicious for pancreatic cancer.  CEA 28.      Zosyn in place for possible cholangitis.    *   05/2016 LGIB.  05/28/17 Coil embolization of SMA >> rebled >> 06/06/17 ascending colectomy.  08/01/17 incisional hernia repair with mesh.    *   AKI. CKD  stage 3 previously.     *   Type 2 DM, insulin requiring.     PLAN   *   Needs EUS, ERCP, stenting/tissue sampling. Dr Carlean Purl to see pt and will try to arrange EUS/ERCP (Dr Ardis Hughs or [place]).     *  Does he still need abx? No fevers and no elevated WBCs.     *  Leave FL diet in place  *  Will need to hold SQ Heparin prior to ERCP/EUS.      Azucena Freed  11/25/2017, 1:49 PM Phone [place] Attending   I have taken an interval history, reviewed the chart and examined the patient. I agree with the Advanced Practitioner's note, impression and recommendations.   Obstructive jaundice w/ pancreatic head mass. This looks like pancreatic cancer. Small chance of ampullary cancer. I have reviewed the images and explained to patient and wife.  Discussed w/ Dr. Rush Landmark.  Will plan for ERCP and brushings/stent.  We should have enough to prove pancreatic cancer and do treatment and will hold EUS n reserve.   He is a bit wet - increased creat and the IVF's - try 40 mg furosemide and reduced and changed IVF I do not think we are going to fix the metabolic acidosis and creatinine has not budged so think we need to treat the obstruction and regroup. CXR in AM  Gatha Mayer, MD, [facility] 11/25/2017 3:06 PM

## 2017-11-25 NOTE — Telephone Encounter (Signed)
This encounter was created in error - please disregard.

## 2017-11-25 NOTE — Care Management Important Message (Signed)
Important Message  Patient Details  Name: Zachary Cook MRN: [id number] Date of Birth: Aug 21, 1932   Medicare Important Message Given:  Yes    Kerin Salen 11/25/2017, 11:38 AMImportant Message  Patient Details  Name: Zachary Cook MRN: [id number] Date of Birth: May 18, 1932   Medicare Important Message Given:  Yes    Kerin Salen 11/25/2017, 11:38 AM

## 2017-11-25 NOTE — Progress Notes (Signed)
PROGRESS NOTE    Zachary Cook  VCB:[id number] DOB: 1932/06/01 DOA: 11/22/2017 PCP: [name], MD   Brief Narrative:  HPI per Dr. Laurey Arrow 11/22/17 Zachary Cook is a 84 y.o. male with medical history significant for hypothyroid, htn, gi bleed (diverticulosis, s/p partial colectomy), incisional hernia repair 07/2017, T2DM, who presents with above.  Symptoms began 5 wks ago and have progressively worsened. Began feeling fatigued, then at times dizzy when ambulating. Then developed nausea, worse with eating, with vomiting after most meals. PO intake decreased, says has lost 30-40 pounds. Began to develop jaundice about a week ago. Says was evaluated by PCP early in this course and says he thinks pcp wasn't much concerned, sounds like symptoms mild at that time. Also had an episode of white/tan stools that have resolved. No fevers. No abdominal pain. No diarrhea, no blood in stool. No new meds. Not a drinker. No history liver or gallbladder problems. No history cancer.  **MRCP was ordered and Gastroenterology was consulted for further evaluation and recommendations. MRCP still awaiting to be done! This AM patient had an unwitnessed fall in the bathroom and hit his head. Head CT was ordered was negative for any intracranial abnormality and there is evidence for chronic microvascular ischemic disease and associated intracranial atherosclerosis.  Assessment & Plan:   Active Problems:   Type 2 diabetes mellitus (HCC)   Hypertension   AKI (acute kidney injury) ([place])   S/P hernia repair   Biliary obstruction   Elevated transaminase level   Jaundice   Hyperbilirubinemia   Elevated alkaline phosphatase level   Loss of weight  Severe Painless Jaundice and Hyperbilirubinemia along with elevated LFTs and Alk Phos ? Cancer (most likely) vs. Biliary Obstruction -Admitted to Inpatient -Has history of nausea, vomiting and poor p.o. intake with 30 to 40 Cook weight loss over the last 5  weeks -Alk phos on admission was 2477 is now trended down to 2,000  -Total bilirubin on admission was 36.0 -> 29.0 -> 28.7 -> 28.9 -AST went from 480 -> 419 -ALT went from 571 -> 377 -Denies any Abdominal Pain -Check CA-19-9 and CEA; done in both of them were grossly abnormal.  CA-19-9 was 2645, and CEA was 28.8 -ANA was negative antimitochondrial antibody was less than 20.0 -U/S abdomen showed contracted gallbladder with moderate cholelithiasis.  No additional sonographic evidence to suggest cholecystitis.  There is mild dilatation of the common bile duct.  There is also findings with mild hepatic steatosis without focal mass and small bilateral renal cysts -Gastroenterology consulted for further evaluation recommendations -Ordered MRCP for further evaluation and showed: "New prominent dilatation of the biliary tree and of the dorsal pancreatic duct, probably terminating in the pancreatic head where there appears to be a focus of restricted diffusion and soft tissue prominence. Although we do not have confirmatory postcontrast imaging, and although today's images are somewhat adversely affected by motion artifact, the appearance is considered highly concerning for the possibility of adenocarcinoma of the pancreatic head; There are multiple gallstones filling the gallbladder, but the truncation in the common bile duct does not appear to have the signal characteristics of these gallstones."  -GI planning for ERCP with brushings and stent and will hold off on doing an EUS currently -Gastroenterology was concerned the patient may be having cholangitis so they have added IV Zosyn but may not need to continue -Gastroenterology started patient on Hydroxyzine 10 mg p.o. every 6 PRN for itching -Consulted oncology for further evaluation and  recommendations as well as palliative care for goals of care  AKI on CKD Stage 3 -Patient's BUN/creatinine on admission was 55/2.73 and then trended down to 52/2.44  however now trended back up to 44/2.98 in the setting of poor p.o. Intake but now may have some volume overload  -Likely in the setting of nausea, vomiting and poor p.o. intake -IVF were changed to Sodium Bicarbonate Gtt however now Discontinued by GI and Fluids changed to LR at 50 mL/hr and was he was given 1x of 40 mg IV Lasix  -Avoid nephrotoxic medications possible -Repeat CMP in a.m.  Diabetes Mellitus Type 2 -Holding Home metformin XR thousand milligrams daily, Victoza 1.2 mg subcu, along with glimepiride 2 mg nightly -Resume Home Insulin and will start the patient on 16 units of Lantus daily history of home regimen of 20 units -Started on Moderate Novolog SSI AC -Last HbA1c was 7.4 back in June; will repeat hemoglobin A1c here -CBG's ranging from 96-191 -Hypoglycemic Protocol in place  Essential Hypertension -BP is currently at Goal 121/80 -Continue Home Amlodipine 5 mg po Daily   HLD -LFTs are currently elevated so we will hold his home Atorvastatin  GERD -Currently not taking Any acid suppressing medications  BPH -C/w Tamsulosin 0.4 mg p.o. daily  Hypothyroidism -TSH was 0.949 -Continue with Levothyroxine 137 mcg p.o. daily before breakfast  Peripheral Neuropathy -Continue home Gabapentin 300 mill grams p.o. twice daily  Metabolic Acidosis -CO2 was 16 and improved to 18 -Started the patient on a bicarbonate drip however this was discontinued by Gastroenterology as they changed to LR -Repeat CMP in a.m.  Hypomagnesemia -Patient magnesium levels 1.4 and improved to 2.0 -Replete with IV mag sulfate 3 g yesterday  -Continue monitor and replete as necessary -Repeat magnesium level in a.m.  Hypokalemia -Patient's potassium was 3.4 and improved to 3.6 -Continue to monitor and replete as necessary -Repeat CMP in the a.m.  Fall -Unwitnessed in the bathroom -Ordered head CT and was negative for any acute intracranial abnormalities  DVT prophylaxis: Heparin  5,000 sq q8h Code Status: DO NOT RESUSCITATE Family Communication: No family present at bedside Disposition Plan: [place] for further workup and treatment  Consultants:   Gastroenterology  Medical Oncology Dr. Lindi Adie  Palliative Care Medicine    Procedures: None  Antimicrobials:  Anti-infectives (From admission, onward)   Start     Dose/Rate Route Frequency Ordered Stop   11/24/17 1400  piperacillin-tazobactam (ZOSYN) IVPB 2.25 g     2.25 g 100 mL/hr over 30 Minutes Intravenous Every 8 hours 11/24/17 1043       Subjective: Seen and examined at bedside   Objective: Vitals:   11/24/17 2107 11/25/17 0609 11/25/17 1111 11/25/17 1125  BP: 138/75 107/84 117/76 121/80  Pulse: 75 75 71 68  Resp: '16 15 20 18  '$ Temp: 99.4 F (37.4 C) 99.3 F (37.4 C) 97.8 F (36.6 C) 99.3 F (37.4 C)  TempSrc: Oral Oral Oral Oral  SpO2: 98% (!) 87% 98% 97%  Weight:    73.4 kg  Height:    '5\' 4"'$  (1.626 m)    Intake/Output Summary (Last 24 hours) at 11/25/2017 1652 Last data filed at 11/25/2017 1039 Gross per 24 hour  Intake 1273.73 ml  Output 800 ml  Net 473.73 ml   Filed Weights   11/22/17 1859 11/25/17 1125  Weight: 66.7 kg 73.4 kg   Examination: Physical Exam:  Constitutional: Overweight Caucasian male who is ill-appearing and currently resting in bed appeared severely fatigued  in no acute distress Eyes: Conjunctive are normal.  Sclera is severely icteric ENMT: External ears and nose appear normal.  Grossly normal hearing Neck: Supple no JVD Respiratory: Diminished to auscultation bilaterally no appreciable wheezing, rales, rhonchi.  Patient was not tachypneic using accessory muscle breathe Cardiovascular: Regular rate and rhythm.  No appreciable murmurs, rubs or gallops.  S1-S2 auscultated.  Has 1+ lower extremity edema Abdomen: Soft, nontender, slightly distended.  Abdomen is yellow and he does have bowel sounds present.  Has a large scar from prior surgery GU:  Deferred Musculoskeletal: No clubbing or cyanosis.  Has severely diminished strength Skin: Skin is warm dry.  However significant yellowing and jaundiced hue of the skin.  Has a mild head laceration from fall with the bandages covering Neurologic: Cranial nerves II through XII grossly intact no appreciable focal deficits Psychiatric: Depressed appearing mood and flat affect.  Patient is awake, alert, oriented however he feels disheartened that he likely has cancer  Data Reviewed: I have personally reviewed following labs and imaging studies  CBC: Recent Labs  Lab 11/22/17 1908 11/23/17 0112 11/24/17 0822 11/25/17 0526  WBC 7.0 5.2 6.3 7.0  NEUTROABS 5.2  --  4.1 4.7  HGB 11.9* 10.4* 10.7* 10.1*  HCT 36.1* 32.5* 33.1* 31.0*  MCV 85.1 84.6 84.4 83.3  PLT 233 219 203 295   Basic Metabolic Panel: Recent Labs  Lab 11/22/17 1908 11/23/17 0112 11/24/17 0822 11/25/17 0526  NA 134* 133* 132* 133*  K 3.6 4.2 3.4* 3.6  CL 102 106 106 103  CO2 17* 14* 16* 18*  GLUCOSE 249* 271* 105* 196*  BUN 55* 52* 48* 44*  CREATININE 2.73* 2.44* 2.91* 2.98*  CALCIUM 10.0 8.9 8.7* 8.8*  MG  --   --  1.4* 2.0  PHOS  --   --  3.3 3.0   GFR: Estimated Creatinine Clearance: 17.2 mL/min (A) (by C-G formula based on SCr of 2.98 mg/dL (H)). Liver Function Tests: Recent Labs  Lab 11/22/17 1908 11/23/17 0112 11/24/17 0822 11/25/17 0526  AST 480* 372* 474* 419*  ALT 571* 405* 424* 377*  ALKPHOS 2,477* 1,983* 2,089* 2,000*  BILITOT 36.0* 29.0* 28.7* 28.9*  PROT 7.5 5.6* 5.8* 5.5*  ALBUMIN 2.9* 2.3* 2.0* 1.9*   Recent Labs  Lab 11/22/17 1908 11/25/17 0526  LIPASE 67* 51   No results for input(s): AMMONIA in the last 168 hours. Coagulation Profile: Recent Labs  Lab 11/22/17 1908  INR 1.38   Cardiac Enzymes: No results for input(s): CKTOTAL, CKMB, CKMBINDEX, TROPONINI in the last 168 hours. BNP (last 3 results) No results for input(s): PROBNP in the last 8760 hours. HbA1C: No results  for input(s): HGBA1C in the last 72 hours. CBG: Recent Labs  Lab 11/24/17 1313 11/24/17 1701 11/24/17 2220 11/25/17 0737 11/25/17 1212  GLUCAP 104* 96 191* 164* 152*   Lipid Profile: No results for input(s): CHOL, HDL, LDLCALC, TRIG, CHOLHDL, LDLDIRECT in the last 72 hours. Thyroid Function Tests: Recent Labs    11/23/17 0112  TSH 0.949   Anemia Panel: No results for input(s): VITAMINB12, FOLATE, FERRITIN, TIBC, IRON, RETICCTPCT in the last 72 hours. Sepsis Labs: No results for input(s): PROCALCITON, LATICACIDVEN in the last 168 hours.  No results found for this or any previous visit (from the past 240 hour(s)).   Radiology Studies: Ct Head Wo Contrast  Result Date: 11/24/2017 CLINICAL DATA:  84 year old male with a history of fall and weakness EXAM: CT HEAD WITHOUT CONTRAST TECHNIQUE: Contiguous axial images were  obtained from the base of the skull through the vertex without intravenous contrast. COMPARISON:  None. FINDINGS: Brain: No acute intracranial hemorrhage. No midline shift or mass effect. Gray-white differentiation maintained. Mild cerebral volume loss. Unremarkable appearance of the ventricle configuration. Hypodensity in the bilateral basal ganglia. Vascular: Calcifications of the intracranial vasculature. Skull: No acute displaced fracture. No scalp hematoma. No radiopaque foreign body. Sinuses/Orbits: No acute finding. Other: None. IMPRESSION: Negative for acute intracranial abnormality. Evidence of chronic microvascular ischemic disease and associated intracranial atherosclerosis. Electronically Signed   By: Corrie Mckusick D.O.   On: 11/24/2017 11:36   Mr Abdomen Mrcp Wo Contrast  Result Date: 11/24/2017 CLINICAL DATA:  Jaundice and abdominal pain. Cholelithiasis. Dilated common bile duct on ultrasound. EXAM: MRI ABDOMEN WITHOUT CONTRAST  (INCLUDING MRCP) TECHNIQUE: Multiplanar multisequence MR imaging of the abdomen was performed. Heavily T2-weighted images of the  biliary and pancreatic ducts were obtained, and three-dimensional MRCP images were rendered by post processing. COMPARISON:  Multiple exams, including ultrasound of 11/22/2017 and CT scan from 02/13/2017 FINDINGS: Despite efforts by the technologist and patient, motion artifact is present on today's exam and could not be eliminated. This reduces exam sensitivity and specificity. Lower chest: Moderate cardiomegaly. Interstitial accentuation in the lung bases favoring interstitial edema. There is likely some mild airspace opacity medially in the right lower lobe. Hepatobiliary: There is abrupt truncation of the dilated common bile duct in the pancreatic head. CBD proximal to this measures 1.6 cm in diameter, and back on the CT exam from 02/13/2017 measured only about 3 mm in diameter. There is mild beading of the extrahepatic biliary tree and notable intrahepatic biliary dilatation as well. The gallbladder is contracted around several gallstones. The stones fill the contracted gallbladder. I do not see a stone in the common bile duct. Pancreas: There is new and abnormal dilation of the dorsal pancreatic duct up to 1.3 cm in diameter. This terminates abruptly in the pancreatic head in the same vicinity as the common bile duct. The entire dorsal pancreatic duct is dilated. Pancreatic atrophy noted. On diffusion-weighted imaging such as image 63/4, there appears to be restricted diffusion in the portion of the pancreatic head associated with truncation of the dorsal pancreatic duct and common bile duct. There is also abnormal fullness of this portion of the pancreatic head for example on image 35/3. Spleen: The spleen appears unremarkable but there is perisplenic ascites. Adrenals/Urinary Tract: There is scarring of the left mid to upper kidney laterally. Adrenal glands normal. Stomach/Bowel: Small type 1 hiatal hernia. Vascular/Lymphatic:  Aortoiliac atherosclerotic vascular disease. Other:  Mild perihepatic and  perisplenic ascites. Musculoskeletal: Unremarkable IMPRESSION: 1. New prominent dilatation of the biliary tree and of the dorsal pancreatic duct, probably terminating in the pancreatic head where there appears to be a focus of restricted diffusion and soft tissue prominence. Although we do not have confirmatory postcontrast imaging, and although today's images are somewhat adversely affected by motion artifact, the appearance is considered highly concerning for the possibility of adenocarcinoma of the pancreatic head. I note that the patient's creatinine is very elevated and accordingly postcontrast imaging may not be feasible. Endoscopic ultrasound might be an option to further characterize the pancreatic head. 2. There are multiple gallstones filling the gallbladder, but the truncation in the common bile duct does not appear to have the signal characteristics of these gallstones. 3. Moderate cardiomegaly with interstitial accentuation in the lung bases favoring interstitial edema. There is likely some airspace opacity medially in the right lower lobe.  4. Mild upper abdominal ascites. 5. Scarring of the left kidney. 6. Small type 1 hiatal hernia. 7.  Aortic Atherosclerosis (ICD10-I70.0). Electronically Signed   By: Van Clines M.D.   On: 11/24/2017 21:18   Mr 3d Recon At Scanner  Result Date: 11/24/2017 CLINICAL DATA:  Jaundice and abdominal pain. Cholelithiasis. Dilated common bile duct on ultrasound. EXAM: MRI ABDOMEN WITHOUT CONTRAST  (INCLUDING MRCP) TECHNIQUE: Multiplanar multisequence MR imaging of the abdomen was performed. Heavily T2-weighted images of the biliary and pancreatic ducts were obtained, and three-dimensional MRCP images were rendered by post processing. COMPARISON:  Multiple exams, including ultrasound of 11/22/2017 and CT scan from 02/13/2017 FINDINGS: Despite efforts by the technologist and patient, motion artifact is present on today's exam and could not be eliminated. This  reduces exam sensitivity and specificity. Lower chest: Moderate cardiomegaly. Interstitial accentuation in the lung bases favoring interstitial edema. There is likely some mild airspace opacity medially in the right lower lobe. Hepatobiliary: There is abrupt truncation of the dilated common bile duct in the pancreatic head. CBD proximal to this measures 1.6 cm in diameter, and back on the CT exam from 02/13/2017 measured only about 3 mm in diameter. There is mild beading of the extrahepatic biliary tree and notable intrahepatic biliary dilatation as well. The gallbladder is contracted around several gallstones. The stones fill the contracted gallbladder. I do not see a stone in the common bile duct. Pancreas: There is new and abnormal dilation of the dorsal pancreatic duct up to 1.3 cm in diameter. This terminates abruptly in the pancreatic head in the same vicinity as the common bile duct. The entire dorsal pancreatic duct is dilated. Pancreatic atrophy noted. On diffusion-weighted imaging such as image 63/4, there appears to be restricted diffusion in the portion of the pancreatic head associated with truncation of the dorsal pancreatic duct and common bile duct. There is also abnormal fullness of this portion of the pancreatic head for example on image 35/3. Spleen: The spleen appears unremarkable but there is perisplenic ascites. Adrenals/Urinary Tract: There is scarring of the left mid to upper kidney laterally. Adrenal glands normal. Stomach/Bowel: Small type 1 hiatal hernia. Vascular/Lymphatic:  Aortoiliac atherosclerotic vascular disease. Other:  Mild perihepatic and perisplenic ascites. Musculoskeletal: Unremarkable IMPRESSION: 1. New prominent dilatation of the biliary tree and of the dorsal pancreatic duct, probably terminating in the pancreatic head where there appears to be a focus of restricted diffusion and soft tissue prominence. Although we do not have confirmatory postcontrast imaging, and although  today's images are somewhat adversely affected by motion artifact, the appearance is considered highly concerning for the possibility of adenocarcinoma of the pancreatic head. I note that the patient's creatinine is very elevated and accordingly postcontrast imaging may not be feasible. Endoscopic ultrasound might be an option to further characterize the pancreatic head. 2. There are multiple gallstones filling the gallbladder, but the truncation in the common bile duct does not appear to have the signal characteristics of these gallstones. 3. Moderate cardiomegaly with interstitial accentuation in the lung bases favoring interstitial edema. There is likely some airspace opacity medially in the right lower lobe. 4. Mild upper abdominal ascites. 5. Scarring of the left kidney. 6. Small type 1 hiatal hernia. 7.  Aortic Atherosclerosis (ICD10-I70.0). Electronically Signed   By: Van Clines M.D.   On: 11/24/2017 21:18   Scheduled Meds: . amLODipine  5 mg Oral Daily  . feeding supplement (ENSURE ENLIVE)  237 mL Oral BID BM  . gabapentin  300 mg  Oral BID  . heparin  5,000 Units Subcutaneous Q8H  . Influenza vac split quadrivalent PF  0.5 mL Intramuscular Tomorrow-1000  . insulin aspart  0-15 Units Subcutaneous TID WC  . insulin glargine  16 Units Subcutaneous Daily  . levothyroxine  137 mcg Oral QAC breakfast  . tamsulosin  0.4 mg Oral Daily   Continuous Infusions: . lactated ringers 50 mL/hr at 11/25/17 1629  . piperacillin-tazobactam (ZOSYN)  IV 2.25 g (11/25/17 1459)    LOS: 3 days   Kerney Elbe, DO [facility] PAGER is on Cool  If 7PM-7AM, please contact night-coverage [email or web address] Password [facility] 11/25/2017, 4:52 PM

## 2017-11-25 NOTE — Consult Note (Signed)
[place] NOTE  Patient Care Team: [name], MD as PCP - General (Family Medicine) Elayne Snare, MD as Consulting Physician (Endocrinology) Virgina Evener, OD as Referring Physician (Optometry)  CHIEF COMPLAINTS/PURPOSE OF CONSULTATION:  Obstructive jaundice suspicious for pancreatic cancer  HISTORY OF PRESENTING ILLNESS:  Zachary Cook 84 y.o. male is here because of recent diagnosis of obstructive jaundice for the past 1 month.  He has been progressively getting worse with increasing fatigue and 80 pound weight loss.  He also had profound nausea and vomiting.  His jaundice actually started to get worse over the past 1 week.  Had no abdominal pain.  3 months ago when he had a CT abdomen and pelvis he did not have any evidence of pancreatic Cancer.  He was noted to have chronic kidney disease from diabetes and hypertension.  He has been mostly bedridden for the past 3 weeks.  He has 2 children and 5 grandchildren.  His wife was in the room when I discussed the care with him.  I reviewed her records extensively and collaborated the history with the patient.  MEDICAL HISTORY:  Past Medical History:  Diagnosis Date  . Barrett's esophagus   . Bilateral inguinal hernia   . Cardiomegaly 02/21/2017   noted on CXR  . Cholelithiasis   . Chronic kidney disease    stage 3 Goes to [place] [facility]  . Diabetes mellitus without complication ([place])    type 2  . Diverticulosis   . Duodenitis   . GERD (gastroesophageal reflux disease)   . Hiatal hernia   . History of blood transfusion 05/2016  . History of GI bleed   . History of hyperkalemia 02/2017  . History of kidney stones    35 years ago  . Hyperlipemia   . Hypertension   . Hypogonadism in male   . Hypothyroidism   . Ileus following gastrointestinal surgery ([place]) 06/12/2016  . Skin cancer    right arm treated by dermatologist  . Syncope 06/03/2016  . Umbilical hernia    Small  . Ventral hernia     Anterior    SURGICAL HISTORY: Past Surgical History:  Procedure Laterality Date  . COLONOSCOPY    . ESOPHAGOGASTRODUODENOSCOPY    . INCISIONAL HERNIA REPAIR N/A 08/01/2017   Procedure: OPEN REPAIR INICISIONAL HERNIA;  Surgeon: Ralene Ok, MD;  Location: [facility];  Service: General;  Laterality: N/A;  TAP BLOCK  . INSERTION OF MESH N/A 08/01/2017   Procedure: INSERTION OF MESH;  Surgeon: Ralene Ok, MD;  Location: [facility];  Service: General;  Laterality: N/A;  . IR GENERIC HISTORICAL  05/28/2016   IR ANGIOGRAM SELECTIVE EACH ADDITIONAL VESSEL 05/28/2016 Aletta Edouard, MD [facility]  . IR GENERIC HISTORICAL  05/28/2016   IR ANGIOGRAM VISCERAL SELECTIVE 05/28/2016 Aletta Edouard, MD [facility]  . IR GENERIC HISTORICAL  05/28/2016   IR US GUIDE VASC ACCESS RIGHT 05/28/2016 Aletta Edouard, MD [facility]  . IR GENERIC HISTORICAL  05/28/2016   IR ANGIOGRAM VISCERAL SELECTIVE 05/28/2016 Arne Cleveland, MD [facility]  . IR GENERIC HISTORICAL  05/28/2016   IR ANGIOGRAM SELECTIVE EACH ADDITIONAL VESSEL 05/28/2016 Arne Cleveland, MD [facility]  . IR GENERIC HISTORICAL  05/28/2016   IR EMBO ART  VEN HEMORR LYMPH EXTRAV  INC GUIDE ROADMAPPING 05/28/2016 Arne Cleveland, MD [facility]  . IR GENERIC HISTORICAL  05/28/2016   IR US GUIDE VASC ACCESS RIGHT 05/28/2016 Arne Cleveland, MD [facility]  . PARTIAL COLECTOMY N/A  06/06/2016   Procedure: OPEN ASCENDING COLECTOMY;  Surgeon: Stark Klein, MD;  Location: [facility];  Service: General;  Laterality: N/A;  . VASECTOMY      SOCIAL HISTORY: Social History   Socioeconomic History  . Marital status: Married    Spouse name: Not on file  . Number of children: Not on file  . Years of education: Not on file  . Highest education level: Not on file  Occupational History  . Not on file  Social Needs  . Financial resource strain: Not on file  . Food insecurity:    Worry: Not on file    Inability: Not on file  . Transportation needs:     Medical: Not on file    Non-medical: Not on file  Tobacco Use  . Smoking status: Never Smoker  . Smokeless tobacco: Never Used  Substance and Sexual Activity  . Alcohol use: No    Alcohol/week: 0.0 standard drinks  . Drug use: No  . Sexual activity: Not Currently  Lifestyle  . Physical activity:    Days per week: Not on file    Minutes per session: Not on file  . Stress: Not on file  Relationships  . Social connections:    Talks on phone: Not on file    Gets together: Not on file    Attends religious service: Not on file    Active member of club or organization: Not on file    Attends meetings of clubs or organizations: Not on file    Relationship status: Not on file  . Intimate partner violence:    Fear of current or ex partner: Not on file    Emotionally abused: Not on file    Physically abused: Not on file    Forced sexual activity: Not on file  Other Topics Concern  . Not on file  Social History Narrative  . Not on file    FAMILY HISTORY: Family History  Problem Relation Age of Onset  . Heart disease Mother   . Diabetes Mother   . Cancer Father   . Heart attack Son     ALLERGIES:  has No Known Allergies.  MEDICATIONS:  Current Facility-Administered Medications  Medication Dose Route Frequency Provider Last Rate Last Dose  . amLODipine (NORVASC) tablet 5 mg  5 mg Oral Daily Wouk, Ailene Rud, MD   5 mg at 11/25/17 1035  . feeding supplement (ENSURE ENLIVE) (ENSURE ENLIVE) liquid 237 mL  237 mL Oral BID BM Sheikh, Omair Latif, DO   237 mL at 11/25/17 1036  . gabapentin (NEURONTIN) capsule 300 mg  300 mg Oral BID Gwynne Edinger, MD   300 mg at 11/25/17 1035  . heparin injection 5,000 Units  5,000 Units Subcutaneous Q8H Gwynne Edinger, MD   5,000 Units at 11/25/17 1350  . hydrOXYzine (ATARAX/VISTARIL) tablet 10 mg  10 mg Oral Q6H PRN Zehr, Jessica D, PA-C   10 mg at 11/23/17 1227  . indomethacin (INDOCIN) 50 MG suppository 100 mg  100 mg Rectal Once  Gatha Mayer, MD      . Influenza vac split quadrivalent PF (FLUZONE HIGH-DOSE) injection 0.5 mL  0.5 mL Intramuscular Tomorrow-1000 Triadhosp, Wladmits, MD      . insulin aspart (novoLOG) injection 0-15 Units  0-15 Units Subcutaneous TID WC Wouk, Ailene Rud, MD   3 Units at 11/25/17 1332  . insulin glargine (LANTUS) injection 16 Units  16 Units Subcutaneous Daily Raiford Noble [place], [place]   16  Units at 11/25/17 1034  . lactated ringers infusion   Intravenous Continuous Gatha Mayer, MD 50 mL/hr at 11/25/17 1629    . levothyroxine (SYNTHROID, LEVOTHROID) tablet 137 mcg  137 mcg Oral QAC breakfast Gwynne Edinger, MD   137 mcg at 11/25/17 [id number]  . ondansetron (ZOFRAN) tablet 4 mg  4 mg Oral Q6H PRN Wouk, Ailene Rud, MD       Or  . ondansetron [facility]) injection 4 mg  4 mg Intravenous Q6H PRN Wouk, Ailene Rud, MD      . piperacillin-tazobactam (ZOSYN) IVPB 2.25 g  2.25 g Intravenous Q8H Esterwood, Amy S, PA-C 100 mL/hr at 11/25/17 1459 2.25 g at 11/25/17 1459  . tamsulosin (FLOMAX) capsule 0.4 mg  0.4 mg Oral Daily Wouk, Ailene Rud, MD   0.4 mg at 11/25/17 1035    REVIEW OF SYSTEMS:   Constitutional: Denies fevers, chills or abnormal night sweats Eyes: Jaundice  ears, nose, mouth, throat, and face: Denies mucositis or sore throat Respiratory: Denies cough, dyspnea or wheezes Cardiovascular: Denies palpitation, chest discomfort or lower extremity swelling Gastrointestinal: Mild abdominal discomfort Skin: Denies abnormal skin rashes Lymphatics: Denies new lymphadenopathy or easy bruising Neurological: Peripheral neuropathy Behavioral/Psych: Tremors   All other systems were reviewed with the patient and are negative.  PHYSICAL EXAMINATION: ECOG PERFORMANCE STATUS: 4 - Bedbound  Vitals:   11/25/17 1111 11/25/17 1125  BP: 117/76 121/80  Pulse: 71 68  Resp: 20 18  Temp: 97.8 F (36.6 C) 99.3 F (37.4 C)  SpO2: 98% 97%   Filed Weights   11/22/17 1859 11/25/17 1125   Weight: 147 lb (66.7 kg) 161 lb 13.1 oz (73.4 kg)    GENERAL:alert, no distress and comfortable SKIN: Severe jaundice EYES: normal, conjunctiva are pink and non-injected, sclera clear OROPHARYNX:no exudate, no erythema and lips, buccal mucosa, and tongue normal  NECK: supple, thyroid normal size, non-tender, without nodularity LYMPH:  no palpable lymphadenopathy in the cervical, axillary or inguinal LUNGS: clear to auscultation and percussion with normal breathing effort HEART: regular rate & rhythm and no murmurs and no lower extremity edema ABDOMEN:abdomen soft, non-tender and normal bowel sounds Musculoskeletal:no cyanosis of digits and no clubbing  PSYCH: alert & oriented x 3 with fluent speech NEURO: no focal motor/sensory deficits  lymphadenopathy (exam performed in the presence of a chaperone)   LABORATORY DATA:  I have reviewed the data as listed Lab Results  Component Value Date   WBC 7.0 11/25/2017   HGB 10.1 (L) 11/25/2017   HCT 31.0 (L) 11/25/2017   MCV 83.3 11/25/2017   PLT 205 11/25/2017   Lab Results  Component Value Date   NA 133 (L) 11/25/2017   K 3.6 11/25/2017   CL 103 11/25/2017   CO2 18 (L) 11/25/2017    RADIOGRAPHIC STUDIES: I have personally reviewed the radiological reports and agreed with the findings in the report. CA 19-9: 2645  ASSESSMENT AND PLAN:  Severe obstructive jaundice with MRCP suggesting a mass in the head of pancreas and a CA 19-9 markedly elevated suggestive of pancreatic adenocarcinoma.  There was abnormal dilatation of the pancreatic duct 1.3 cm.  There was an abnormal fullness of the pancreatic head.  I discussed with the patient that given his advanced age and his comorbidities, pancreatic cancer is a fatal disease with limited prognosis. I do not believe he would be a candidate for resection. He is definitely not a candidate for systemic chemotherapy.  I agree with ERCP and EUS and stent  placement to help with the  jaundice. He will need hospice and palliative care to assist him regarding end of life care.   All questions were answered. The patient knows to call the clinic with any problems, questions or concerns.    Harriette Ohara, MD @T @

## 2017-11-26 ENCOUNTER — Inpatient Hospital Stay (HOSPITAL_COMMUNITY): Payer: Medicare Other | Admitting: Anesthesiology

## 2017-11-26 ENCOUNTER — Inpatient Hospital Stay (HOSPITAL_COMMUNITY): Payer: Medicare Other

## 2017-11-26 ENCOUNTER — Encounter (HOSPITAL_COMMUNITY)
Admission: EM | Disposition: A | Discharge status: Hospice | Payer: Self-pay | Source: Home / Self Care | Attending: Internal Medicine

## 2017-11-26 ENCOUNTER — Encounter (HOSPITAL_COMMUNITY): Payer: Self-pay | Admitting: *Deleted

## 2017-11-26 ENCOUNTER — Ambulatory Visit: Payer: Self-pay | Admitting: Endocrinology

## 2017-11-26 DIAGNOSIS — C259 Malignant neoplasm of pancreas, unspecified: Secondary | ICD-10-CM | POA: Diagnosis present

## 2017-11-26 HISTORY — PX: SPHINCTEROTOMY: SHX5544

## 2017-11-26 HISTORY — PX: ERCP: SHX5425

## 2017-11-26 LAB — CBC WITH DIFFERENTIAL/PLATELET
BASOS ABS: 0 10*3/uL (ref 0.0–0.1)
BASOS PCT: 1 %
EOS ABS: 0.1 10*3/uL (ref 0.0–0.7)
Eosinophils Relative: 2 %
HEMATOCRIT: 27.3 % — AB (ref 39.0–52.0)
HEMOGLOBIN: 8.9 g/dL — AB (ref 13.0–17.0)
Lymphocytes Relative: 16 %
Lymphs Abs: 1.1 10*3/uL (ref 0.7–4.0)
MCH: 27.5 pg (ref 26.0–34.0)
MCHC: 32.6 g/dL (ref 30.0–36.0)
MCV: 84.3 fL (ref 78.0–100.0)
MONO ABS: 0.5 10*3/uL (ref 0.1–1.0)
MONOS PCT: 8 %
NEUTROS ABS: 5.2 10*3/uL (ref 1.7–7.7)
NEUTROS PCT: 73 %
PLATELETS: 175 10*3/uL (ref 150–400)
RBC: 3.24 MIL/uL — ABNORMAL LOW (ref 4.22–5.81)
RDW: 18.9 % — ABNORMAL HIGH (ref 11.5–15.5)
WBC: 7.1 10*3/uL (ref 4.0–10.5)

## 2017-11-26 LAB — COMPREHENSIVE METABOLIC PANEL
ALT: 282 U/L — ABNORMAL HIGH (ref 0–44)
ANION GAP: 12 (ref 5–15)
AST: 241 U/L — ABNORMAL HIGH (ref 15–41)
Albumin: 1.8 g/dL — ABNORMAL LOW (ref 3.5–5.0)
Alkaline Phosphatase: 1546 U/L — ABNORMAL HIGH (ref 38–126)
BILIRUBIN TOTAL: 28.3 mg/dL — AB (ref 0.3–1.2)
BUN: 48 mg/dL — ABNORMAL HIGH (ref 8–23)
CO2: 21 mmol/L — ABNORMAL LOW (ref 22–32)
Calcium: 8.5 mg/dL — ABNORMAL LOW (ref 8.9–10.3)
Chloride: 103 mmol/L (ref 98–111)
Creatinine, Ser: 3.37 mg/dL — ABNORMAL HIGH (ref 0.61–1.24)
GFR calc Af Amer: 18 mL/min — ABNORMAL LOW (ref >60)
GFR, EST NON AFRICAN AMERICAN: 16 mL/min — AB (ref >60)
Glucose, Bld: 194 mg/dL — ABNORMAL HIGH (ref 70–99)
POTASSIUM: 4.1 mmol/L (ref 3.5–5.1)
Sodium: 136 mmol/L (ref 135–145)
TOTAL PROTEIN: 5.1 g/dL — AB (ref 6.5–8.1)

## 2017-11-26 LAB — GLUCOSE, CAPILLARY
GLUCOSE-CAPILLARY: 143 mg/dL — AB (ref 70–99)
Glucose-Capillary: 174 mg/dL — ABNORMAL HIGH (ref 70–99)
Glucose-Capillary: 113 mg/dL — ABNORMAL HIGH (ref 70–99)
Glucose-Capillary: 137 mg/dL — ABNORMAL HIGH (ref 70–99)

## 2017-11-26 LAB — MAGNESIUM: MAGNESIUM: 1.9 mg/dL (ref 1.7–2.4)

## 2017-11-26 LAB — PHOSPHORUS: PHOSPHORUS: 3.4 mg/dL (ref 2.5–4.6)

## 2017-11-26 SURGERY — ERCP, WITH INTERVENTION IF INDICATED
Anesthesia: General

## 2017-11-26 MED ORDER — GLUCAGON HCL RDNA (DIAGNOSTIC) 1 MG IJ SOLR
INTRAMUSCULAR | Status: DC | PRN
  Administered 2017-11-26 (×2): .5 mg via INTRAVENOUS

## 2017-11-26 MED ORDER — INDOMETHACIN 50 MG RE SUPP
RECTAL | Status: AC
  Filled 2017-11-26: qty 2

## 2017-11-26 MED ORDER — FENTANYL CITRATE (PF) 100 MCG/2ML IJ SOLN
INTRAMUSCULAR | Status: AC
  Filled 2017-11-26: qty 2

## 2017-11-26 MED ORDER — FENTANYL CITRATE (PF) 250 MCG/5ML IJ SOLN
INTRAMUSCULAR | Status: DC | PRN
  Administered 2017-11-26: 50 ug via INTRAVENOUS
  Administered 2017-11-26 (×2): 25 ug via INTRAVENOUS

## 2017-11-26 MED ORDER — SUGAMMADEX SODIUM 200 MG/2ML IV SOLN
INTRAVENOUS | Status: DC | PRN
  Administered 2017-11-26: 200 mg via INTRAVENOUS

## 2017-11-26 MED ORDER — EPHEDRINE SULFATE 50 MG/ML IJ SOLN
INTRAMUSCULAR | Status: DC | PRN
  Administered 2017-11-26 (×4): 10 mg via INTRAVENOUS

## 2017-11-26 MED ORDER — ALBUMIN HUMAN 5 % IV SOLN
INTRAVENOUS | Status: DC | PRN
  Administered 2017-11-26: 13:00:00 via INTRAVENOUS

## 2017-11-26 MED ORDER — SODIUM CHLORIDE 0.9 % IV SOLN
INTRAVENOUS | Status: DC
  Administered 2017-11-26: 13:00:00 via INTRAVENOUS
  Administered 2017-11-26: 1000 mL via INTRAVENOUS

## 2017-11-26 MED ORDER — INDOMETHACIN 50 MG RE SUPP
RECTAL | Status: DC | PRN
  Administered 2017-11-26: 100 mg via RECTAL

## 2017-11-26 MED ORDER — GLUCAGON HCL RDNA (DIAGNOSTIC) 1 MG IJ SOLR
INTRAMUSCULAR | Status: AC
  Filled 2017-11-26: qty 1

## 2017-11-26 MED ORDER — PROPOFOL 10 MG/ML IV BOLUS
INTRAVENOUS | Status: DC | PRN
  Administered 2017-11-26: 100 mg via INTRAVENOUS

## 2017-11-26 MED ORDER — LIDOCAINE 2% (20 MG/ML) 5 ML SYRINGE
INTRAMUSCULAR | Status: DC | PRN
  Administered 2017-11-26: 50 mg via INTRAVENOUS

## 2017-11-26 MED ORDER — SODIUM CHLORIDE 0.9 % IV SOLN
INTRAVENOUS | Status: DC | PRN
  Administered 2017-11-26: 15 mL

## 2017-11-26 MED ORDER — GUAIFENESIN-DM 100-10 MG/5ML PO SYRP
5.0000 mL | ORAL_SOLUTION | ORAL | Status: DC | PRN
  Administered 2017-11-29: 5 mL via ORAL
  Filled 2017-11-26: qty 10

## 2017-11-26 MED ORDER — ROCURONIUM BROMIDE 10 MG/ML (PF) SYRINGE
PREFILLED_SYRINGE | INTRAVENOUS | Status: DC | PRN
  Administered 2017-11-26: 40 mg via INTRAVENOUS

## 2017-11-26 MED ORDER — ONDANSETRON HCL 4 MG/2ML IJ SOLN
INTRAMUSCULAR | Status: DC | PRN
  Administered 2017-11-26: 4 mg via INTRAVENOUS

## 2017-11-26 MED ORDER — PROPOFOL 10 MG/ML IV BOLUS
INTRAVENOUS | Status: AC
  Filled 2017-11-26: qty 20

## 2017-11-26 MED ORDER — PHENYLEPHRINE 40 MCG/ML (10ML) SYRINGE FOR IV PUSH (FOR BLOOD PRESSURE SUPPORT)
PREFILLED_SYRINGE | INTRAVENOUS | Status: DC | PRN
  Administered 2017-11-26 (×5): 80 ug via INTRAVENOUS

## 2017-11-26 NOTE — Op Note (Signed)
[facility] Patient Name: Zachary Cook Procedure Date: 11/26/2017 MRN: [id number] Attending MD: Gatha Mayer , MD Date of Birth: [birth year] CSN: [id number] Age: 84 Admit Type: Inpatient Procedure:                ERCP Indications:              Jaundice, Malignant tumor of the head of pancreas Providers:                Gatha Mayer, MD, Burtis Junes, RN, Tinnie Gens,                            Technician, [facility], CRNA Referring MD:              Medicines:                General Anesthesia, On Zosyn Complications:            No immediate complications. Estimated Blood Loss:     Estimated blood loss: none. Procedure:                Pre-Anesthesia Assessment:                           - Prior to the procedure, a History and Physical                            was performed, and patient medications and                            allergies were reviewed. The patient's tolerance of                            previous anesthesia was also reviewed. The risks                            and benefits of the procedure and the sedation                            options and risks were discussed with the patient.                            All questions were answered, and informed consent                            was obtained. Prior Anticoagulants: The patient                            last took heparin on the day of the procedure. ASA                            Grade Assessment: III - A patient with severe                            systemic disease. After reviewing the risks and  benefits, the patient was deemed in satisfactory                            condition to undergo the procedure.                           After obtaining informed consent, the scope was                            passed under direct vision. Throughout the                            procedure, the patient's blood pressure, pulse, and   oxygen saturations were monitored continuously. The                            [id number]-Q180V ([id number]) Olympus ERCP was introduced                            through the mouth, and used to inject contrast into                            and used to inject contrast into the ventral                            pancreatic duct. The ERCP was performed with                            difficulty due to challenging cannulation. The                            patient tolerated the procedure well. Scope In: Scope Out: Findings:      The scout film was normal. Esophagus not seen well. NL stomach.       Scattered ersoions in duodenum. Major papilla enlarged with large hood       overhanging a NL papilla. I was able to seat the sphincterotome but wire       would nevere advance into a duct. I changed to 025 from [place] still       no better. Partial pancreatogram was obtained after sphincterotome used       to precut in direction of bile duct with wire seated in the papilla.       looks like a stricture in the head. However still could not get a wire       or contrast into the bile duct. Impression:               - The examination was suspicious for a localized                            biliary stricture. Unable to pass wire r get                            contrast into biliary tree.                           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 Moderate Sedation:      N/A- Per Anesthesia Care Recommendation:           - Return patient to hospital ward for ongoing care.                           - Will need to discuss next options, anticipate                            having my partner Dr. Rush Landmark try to get access                            via ERCP or perhaps using EUS. Is suspect the                            earliest would ne Friday but will let you know.                           Continue Zosyn Procedure Code(s):        --- Professional ---                           [phone], Endoscopic  retrograde                            cholangiopancreatography (ERCP); with                            sphincterotomy/papillotomy Diagnosis Code(s):        --- Professional ---                           K86.89, Other specified diseases of pancreas                           R17, Unspecified jaundice                           C25.0, Malignant neoplasm of head of pancreas CPT copyright 2017 [employer]. All rights reserved. The codes documented in this report are preliminary and upon coder review may  be revised to meet current compliance requirements. Gatha Mayer, MD 11/26/2017 2:08:55 PM This report has been signed electronically. Number of Addenda: 0

## 2017-11-26 NOTE — Transfer of Care (Signed)
Immediate Anesthesia Transfer of Care Note  Patient: Zachary Cook  Procedure(s) Performed: ENDOSCOPIC RETROGRADE CHOLANGIOPANCREATOGRAPHY (ERCP) (N/A ) SPHINCTEROTOMY (partial)  Patient Location: PACU  Anesthesia Type:General  Level of Consciousness: awake, alert  and oriented  Airway & Oxygen Therapy: Patient Spontanous Breathing and Patient connected to face mask oxygen  Post-op Assessment: Report given to RN and Post -op Vital signs reviewed and stable  Post vital signs: Reviewed and stable  Last Vitals:  Vitals Value Taken Time  BP 98/44 11/26/2017  2:00 PM  Temp    Pulse 65 11/26/2017  2:02 PM  Resp 18 11/26/2017  2:02 PM  SpO2 100 % 11/26/2017  2:02 PM  Vitals shown include unvalidated device data.  Last Pain:  Vitals:   11/26/17 1141  TempSrc: Oral  PainSc: 0-No pain         Complications: No apparent anesthesia complications

## 2017-11-26 NOTE — Progress Notes (Signed)
Patient ID: Zachary Cook, male   DOB: Mar 14, 1932, 84 y.o.   MRN: [id number]    Progress Note   Subjective  Day # 5 hospital stay    Alert, seems a little stronger than my previous visit. Marland Kitchen No c/o SOB, no nausea or pain, no fever.  CXR - done -pending   Objective   Vital signs in last 24 hours: Temp:  [97.6 F (36.4 C)-99.3 F (37.4 C)] 97.6 F (36.4 C) (09/18 0507) Pulse Rate:  [51-71] 51 (09/18 0507) Resp:  [18-20] 18 (09/18 0507) BP: (83-121)/(49-80) 118/74 (09/18 0507) SpO2:  [94 %-98 %] 94 % (09/18 0507) Weight:  [73.4 kg] 73.4 kg (09/17 1125) Last BM Date: 11/25/17 General:   elderly WM, deeply jaundicedin NAD Heart:  Regular rate and rhythm; no murmurs Lungs: Respirations even and unlabored, lungs CTA bilaterally Abdomen:  Soft, nontender and nondistended. Normal bowel sounds. Extremities:  Without edema., jaundiced Neurologic:  Alert and oriented,  grossly normal neurologically. Psych:  Cooperative. Normal mood and affect.  Intake/Output from previous day: 09/17 0701 - 09/18 0700 In: 1393.8 [P.O.:240; I.V.:1003.8; IV Piggyback:150] Out: 1751 [Urine:1200; Stool:1] Intake/Output this shift: No intake/output data recorded.  Lab Results: Recent Labs    11/24/17 0822 11/25/17 0526 11/26/17 0330  WBC 6.3 7.0 7.1  HGB 10.7* 10.1* 8.9*  HCT 33.1* 31.0* 27.3*  PLT 203 205 175   BMET Recent Labs    11/24/17 0822 11/25/17 0526 11/26/17 0330  NA 132* 133* 136  K 3.4* 3.6 4.1  CL 106 103 103  CO2 16* 18* 21*  GLUCOSE 105* 196* 194*  BUN 48* 44* 48*  CREATININE 2.91* 2.98* 3.37*  CALCIUM 8.7* 8.8* 8.5*   LFT Recent Labs    11/26/17 0330  PROT 5.1*  ALBUMIN 1.8*  AST 241*  ALT 282*  ALKPHOS 1,546*  BILITOT 28.3*   PT/INR No results for input(s): LABPROT, INR in the last 72 hours.  Studies/Results: Ct Head Wo Contrast  Result Date: 11/24/2017 CLINICAL DATA:  84 year old male with a history of fall and weakness EXAM: CT HEAD WITHOUT CONTRAST  TECHNIQUE: Contiguous axial images were obtained from the base of the skull through the vertex without intravenous contrast. COMPARISON:  None. FINDINGS: Brain: No acute intracranial hemorrhage. No midline shift or mass effect. Gray-white differentiation maintained. Mild cerebral volume loss. Unremarkable appearance of the ventricle configuration. Hypodensity in the bilateral basal ganglia. Vascular: Calcifications of the intracranial vasculature. Skull: No acute displaced fracture. No scalp hematoma. No radiopaque foreign body. Sinuses/Orbits: No acute finding. Other: None. IMPRESSION: Negative for acute intracranial abnormality. Evidence of chronic microvascular ischemic disease and associated intracranial atherosclerosis. Electronically Signed   By: Corrie Mckusick D.O.   On: 11/24/2017 11:36   Mr Abdomen Mrcp Wo Contrast  Result Date: 11/24/2017 CLINICAL DATA:  Jaundice and abdominal pain. Cholelithiasis. Dilated common bile duct on ultrasound. EXAM: MRI ABDOMEN WITHOUT CONTRAST  (INCLUDING MRCP) TECHNIQUE: Multiplanar multisequence MR imaging of the abdomen was performed. Heavily T2-weighted images of the biliary and pancreatic ducts were obtained, and three-dimensional MRCP images were rendered by post processing. COMPARISON:  Multiple exams, including ultrasound of 11/22/2017 and CT scan from 02/13/2017 FINDINGS: Despite efforts by the technologist and patient, motion artifact is present on today's exam and could not be eliminated. This reduces exam sensitivity and specificity. Lower chest: Moderate cardiomegaly. Interstitial accentuation in the lung bases favoring interstitial edema. There is likely some mild airspace opacity medially in the right lower lobe. Hepatobiliary: There is abrupt truncation  of the dilated common bile duct in the pancreatic head. CBD proximal to this measures 1.6 cm in diameter, and back on the CT exam from 02/13/2017 measured only about 3 mm in diameter. There is mild beading of  the extrahepatic biliary tree and notable intrahepatic biliary dilatation as well. The gallbladder is contracted around several gallstones. The stones fill the contracted gallbladder. I do not see a stone in the common bile duct. Pancreas: There is new and abnormal dilation of the dorsal pancreatic duct up to 1.3 cm in diameter. This terminates abruptly in the pancreatic head in the same vicinity as the common bile duct. The entire dorsal pancreatic duct is dilated. Pancreatic atrophy noted. On diffusion-weighted imaging such as image 63/4, there appears to be restricted diffusion in the portion of the pancreatic head associated with truncation of the dorsal pancreatic duct and common bile duct. There is also abnormal fullness of this portion of the pancreatic head for example on image 35/3. Spleen: The spleen appears unremarkable but there is perisplenic ascites. Adrenals/Urinary Tract: There is scarring of the left mid to upper kidney laterally. Adrenal glands normal. Stomach/Bowel: Small type 1 hiatal hernia. Vascular/Lymphatic:  Aortoiliac atherosclerotic vascular disease. Other:  Mild perihepatic and perisplenic ascites. Musculoskeletal: Unremarkable IMPRESSION: 1. New prominent dilatation of the biliary tree and of the dorsal pancreatic duct, probably terminating in the pancreatic head where there appears to be a focus of restricted diffusion and soft tissue prominence. Although we do not have confirmatory postcontrast imaging, and although today's images are somewhat adversely affected by motion artifact, the appearance is considered highly concerning for the possibility of adenocarcinoma of the pancreatic head. I note that the patient's creatinine is very elevated and accordingly postcontrast imaging may not be feasible. Endoscopic ultrasound might be an option to further characterize the pancreatic head. 2. There are multiple gallstones filling the gallbladder, but the truncation in the common bile duct  does not appear to have the signal characteristics of these gallstones. 3. Moderate cardiomegaly with interstitial accentuation in the lung bases favoring interstitial edema. There is likely some airspace opacity medially in the right lower lobe. 4. Mild upper abdominal ascites. 5. Scarring of the left kidney. 6. Small type 1 hiatal hernia. 7.  Aortic Atherosclerosis (ICD10-I70.0). Electronically Signed   By: Van Clines M.D.   On: 11/24/2017 21:18   Mr 3d Recon At Scanner  Result Date: 11/24/2017 CLINICAL DATA:  Jaundice and abdominal pain. Cholelithiasis. Dilated common bile duct on ultrasound. EXAM: MRI ABDOMEN WITHOUT CONTRAST  (INCLUDING MRCP) TECHNIQUE: Multiplanar multisequence MR imaging of the abdomen was performed. Heavily T2-weighted images of the biliary and pancreatic ducts were obtained, and three-dimensional MRCP images were rendered by post processing. COMPARISON:  Multiple exams, including ultrasound of 11/22/2017 and CT scan from 02/13/2017 FINDINGS: Despite efforts by the technologist and patient, motion artifact is present on today's exam and could not be eliminated. This reduces exam sensitivity and specificity. Lower chest: Moderate cardiomegaly. Interstitial accentuation in the lung bases favoring interstitial edema. There is likely some mild airspace opacity medially in the right lower lobe. Hepatobiliary: There is abrupt truncation of the dilated common bile duct in the pancreatic head. CBD proximal to this measures 1.6 cm in diameter, and back on the CT exam from 02/13/2017 measured only about 3 mm in diameter. There is mild beading of the extrahepatic biliary tree and notable intrahepatic biliary dilatation as well. The gallbladder is contracted around several gallstones. The stones fill the contracted gallbladder. I do  not see a stone in the common bile duct. Pancreas: There is new and abnormal dilation of the dorsal pancreatic duct up to 1.3 cm in diameter. This terminates  abruptly in the pancreatic head in the same vicinity as the common bile duct. The entire dorsal pancreatic duct is dilated. Pancreatic atrophy noted. On diffusion-weighted imaging such as image 63/4, there appears to be restricted diffusion in the portion of the pancreatic head associated with truncation of the dorsal pancreatic duct and common bile duct. There is also abnormal fullness of this portion of the pancreatic head for example on image 35/3. Spleen: The spleen appears unremarkable but there is perisplenic ascites. Adrenals/Urinary Tract: There is scarring of the left mid to upper kidney laterally. Adrenal glands normal. Stomach/Bowel: Small type 1 hiatal hernia. Vascular/Lymphatic:  Aortoiliac atherosclerotic vascular disease. Other:  Mild perihepatic and perisplenic ascites. Musculoskeletal: Unremarkable IMPRESSION: 1. New prominent dilatation of the biliary tree and of the dorsal pancreatic duct, probably terminating in the pancreatic head where there appears to be a focus of restricted diffusion and soft tissue prominence. Although we do not have confirmatory postcontrast imaging, and although today's images are somewhat adversely affected by motion artifact, the appearance is considered highly concerning for the possibility of adenocarcinoma of the pancreatic head. I note that the patient's creatinine is very elevated and accordingly postcontrast imaging may not be feasible. Endoscopic ultrasound might be an option to further characterize the pancreatic head. 2. There are multiple gallstones filling the gallbladder, but the truncation in the common bile duct does not appear to have the signal characteristics of these gallstones. 3. Moderate cardiomegaly with interstitial accentuation in the lung bases favoring interstitial edema. There is likely some airspace opacity medially in the right lower lobe. 4. Mild upper abdominal ascites. 5. Scarring of the left kidney. 6. Small type 1 hiatal hernia. 7.   Aortic Atherosclerosis (ICD10-I70.0). Electronically Signed   By: Van Clines M.D.   On: 11/24/2017 21:18       Assessment / Plan:     #1 84 yo WM with painless jaundice , weight loss, anorexia, and MRCP concerning for pancreatic head mass with biliary obstruction , also numerous gallstones  CA 19 -9 markedly elevated   #2 AKI onCKD- creat on the rise  #3 CHF- sats good, CXR Pending  #4 AODM  Plan; Pt scheduled fro ERCP/brushings, stent today  Continue Zosyn  Remains on SQ heparin Oncology has seen and plan will be for palliative care        Contact  Amy Esterwood, P.A.-C               [phone]      Active Problems:   Type 2 diabetes mellitus ([place])   Hypertension   AKI (acute kidney injury) ([place])   S/P hernia repair   Biliary obstruction   Elevated transaminase level   Jaundice   Hyperbilirubinemia   Elevated alkaline phosphatase level   Loss of weight     LOS: 4 days   Amy Esterwood  11/26/2017, 8:45 AM

## 2017-11-26 NOTE — Anesthesia Preprocedure Evaluation (Addendum)
Anesthesia Evaluation  Patient identified by MRN, date of birth, ID band Patient awake    Reviewed: Allergy & Precautions, NPO status , Patient's Chart, lab work & pertinent test results  History of Anesthesia Complications Negative for: history of anesthetic complications  Airway Mallampati: II  TM Distance: >3 FB Neck ROM: Full    Dental  (+) Dental Advisory Given, Teeth Intact   Pulmonary neg pulmonary ROS,    breath sounds clear to auscultation       Cardiovascular hypertension, Pt. on medications (-) angina Rhythm:Regular Rate:Normal     Neuro/Psych negative neurological ROS  negative psych ROS   GI/Hepatic Neg liver ROS, hiatal hernia, GERD  Medicated and Controlled, Obstructive jaundice    Endo/Other  diabetes, Type 2, Insulin DependentHypothyroidism   Renal/GU CRFRenal disease    Hypogonadism     Musculoskeletal negative musculoskeletal ROS (+)   Abdominal   Peds  Hematology  (+) anemia ,   Anesthesia Other Findings   Reproductive/Obstetrics                            Anesthesia Physical Anesthesia Plan  ASA: III  Anesthesia Plan: General   Post-op Pain Management:    Induction: Intravenous  PONV Risk Score and Plan: 2 and Treatment may vary due to age or medical condition, Ondansetron and Propofol infusion  Airway Management Planned: Oral ETT  Additional Equipment: None  Intra-op Plan:   Post-operative Plan: Extubation in OR  Informed Consent: I have reviewed the patients History and Physical, chart, labs and discussed the procedure including the risks, benefits and alternatives for the proposed anesthesia with the patient or authorized representative who has indicated his/her understanding and acceptance.   Dental advisory given  Plan Discussed with: CRNA and Anesthesiologist  Anesthesia Plan Comments: (Discussed DNR with patient. Patient accepting of  intubation for procedure, IV fluids, and vasopressor medications. Patient also accepting of CPR if cause of arrest is deemed to be quickly reversible. Patient does not want any aggressive measures taken after the procedure if he were to arrest, such as reintubation or CPR.)      Anesthesia Quick Evaluation

## 2017-11-26 NOTE — Anesthesia Postprocedure Evaluation (Signed)
Anesthesia Post Note  Patient: Zachary Cook  Procedure(s) Performed: ENDOSCOPIC RETROGRADE CHOLANGIOPANCREATOGRAPHY (ERCP) (N/A ) SPHINCTEROTOMY (partial)     Patient location during evaluation: PACU Anesthesia Type: General Level of consciousness: awake and alert Pain management: pain level controlled Vital Signs Assessment: post-procedure vital signs reviewed and stable Respiratory status: spontaneous breathing, nonlabored ventilation and respiratory function stable Cardiovascular status: blood pressure returned to baseline and stable Postop Assessment: no apparent nausea or vomiting Anesthetic complications: no    Last Vitals:  Vitals:   11/26/17 1430 11/26/17 1440  BP: (!) 108/51 (!) 97/47  Pulse: (!) 55 (!) 59  Resp: 11 (!) 23  Temp: (!) 36.3 C   SpO2: 96% (!) 86%    Last Pain:  Vitals:   11/26/17 1440  TempSrc:   PainSc: 0-No pain                 Audry Pili

## 2017-11-26 NOTE — Anesthesia Procedure Notes (Signed)
Procedure Name: Intubation Date/Time: 11/26/2017 12:35 PM Performed by: Kingson Lohmeyer D, CRNA Pre-anesthesia Checklist: Patient identified, Emergency Drugs available, Suction available and Patient being monitored Patient Re-evaluated:Patient Re-evaluated prior to induction Oxygen Delivery Method: Circle system utilized Preoxygenation: Pre-oxygenation with 100% oxygen Induction Type: IV induction Ventilation: Mask ventilation without difficulty Laryngoscope Size: Mac and 4 Grade View: Grade I Tube type: Oral Tube size: 7.5 mm Number of attempts: 1 Airway Equipment and Method: Stylet Placement Confirmation: ETT inserted through vocal cords under direct vision,  positive ETCO2 and breath sounds checked- equal and bilateral Secured at: 21 cm Tube secured with: Tape Dental Injury: Teeth and Oropharynx as per pre-operative assessment

## 2017-11-26 NOTE — Interval H&P Note (Signed)
History and Physical Interval Note:  11/26/2017 12:22 PM  Zachary Cook  has presented today for surgery, with the diagnosis of obstructive jaundice  The various methods of treatment have been discussed with the patient and family. After consideration of risks, benefits and other options for treatment, the patient has consented to  Procedure(s): ENDOSCOPIC RETROGRADE CHOLANGIOPANCREATOGRAPHY (ERCP) (N/A) as a surgical intervention .  The patient's history has been reviewed, patient examined, no change in status, stable for surgery.  I have reviewed the patient's chart and labs.  Questions were answered to the patient's satisfaction.     Silvano Rusk

## 2017-11-26 NOTE — Progress Notes (Addendum)
PROGRESS NOTE    Zachary Cook  AOZ:[id number] DOB: [birth year] DOA: 11/22/2017 PCP: [name], MD   Brief Narrative:  HPI On 11/22/2017 by Dr. Laurey Arrow Mose Zachary Cook is a 84 y.o. male with medical history significant for hypothyroid, htn, gi bleed (diverticulosis, s/p partial colectomy), incisional hernia repair 07/2017, T2DM, who presents with above.  Symptoms began 5 wks ago and have progressively worsened. Began feeling fatigued, then at times dizzy when ambulating. Then developed nausea, worse with eating, with vomiting after most meals. PO intake decreased, says has lost 30-40 pounds. Began to develop jaundice about a week ago. Says was evaluated by PCP early in this course and says he thinks pcp wasn't much concerned, sounds like symptoms mild at that time. Also had an episode of white/tan stools that have resolved. No fevers. No abdominal pain. No diarrhea, no blood in stool. No new meds. Not a drinker. No history liver or gallbladder problems. No history cancer.  Interim history  Presented with jaundice, nausea and vomiting.  Admitted and found to have hyperbilirubinemia along with elevated LFTs.  GI consulted, MRCP done.  S/p ERCP today.  Assessment & Plan   Severe painless jaundice/hyperbilirubinemia/elevated LFTs -Abdominal ultrasound showed contracted gallbladder with moderate cholelithiasis.  Mild dilatation of common bile duct.  Mild hepatic steatosis. -LFTs as well as bilirubin and alk phos all elevated upon admission, however trending downward -Gastroenterology consulted and appreciated recommended MRCP which showed no prominent dilatation of the biliary tree and of the dorsal pancreatic duct, findings concerning for possibility of adenocarcinoma pancreatic head.  Multiple gallstones. -Status post ERCP today -Continue IV Zosyn as well as antiemetics -Oncology consulted and appreciated  Acute kidney injury on chronic kidney disease, stage III with metabolic  acidosis -Likely secondary to poor oral intake -Creatinine baseline approximately 1.5-1.6, on admission 2.73, currently 3.37 -Continue IV fluids and monitor BMP -Will obtain renal ultrasound -Avoid nephrotoxic agents  -of note, patient was given IV lasix and indomethacin on 11/25/2017  Diabetes mellitus, type II -Metformin, Victoza, glipizide held -Continue insulin, Lantus, CBG monitoring  Essential hypertension -Continue amlodipine  Hyperlipidemia -Statin held due to elevated LFTs  BPH -Continue tamsulosin  Hypothyroidism -Continue Synthroid, TSH checked 0.949  Peripheral neuropathy -Continue gabapentin  Hypomagnesemia -Resolved with replacement  Hypokalemia -Resolved with replacement -Continue to monitor BMP  Fall -Unwitnessed in the bathroom -CT head unremarkable for acute intracranial abnormalities  DVT Prophylaxis  Heparin  Code Status: DNR  Family Communication: None at bedside  Disposition Plan: Admitted. Pending further recommendations from palliative care,  Oncology, GI.  Consultants Gastroenterology Oncology Palliative Care  Procedures  MRCP ERCP  Antibiotics   Anti-infectives (From admission, onward)   Start     Dose/Rate Route Frequency Ordered Stop   11/24/17 1400  piperacillin-tazobactam (ZOSYN) IVPB 2.25 g     2.25 g 100 mL/hr over 30 Minutes Intravenous Every 8 hours 11/24/17 1043        Subjective:   Paris Ghazarian seen and examined today.  Patient has no complaints today.  Denies current chest pain, shortness of breath, abdominal pain, current nausea or vomiting, dizziness or headache. Objective:   Vitals:   11/26/17 1420 11/26/17 1430 11/26/17 1440 11/26/17 1518  BP: (!) 106/46 (!) 108/51 (!) 97/47 (!) 97/54  Pulse: 62 (!) 55 (!) 59 (!) 59  Resp: 16 11 (!) 23 16  Temp:  (!) 97.4 F (36.3 C)  98.7 F (37.1 C)  TempSrc:      SpO2: 90% 96% Marland Kitchen)  86% (!) 88%  Weight:      Height:        Intake/Output Summary (Last 24 hours)  at 11/26/2017 1604 Last data filed at 11/26/2017 1401 Gross per 24 hour  Intake 2530.3 ml  Output 701 ml  Net 1829.3 ml   Filed Weights   11/22/17 1859 11/25/17 1125  Weight: 66.7 kg 73.4 kg    Exam  General: Well developed, well nourished, NAD  HEENT: NCAT, scleral icterus, mucous membranes moist.   Neck: Supple  Cardiovascular: S1 S2 auscultated, no murmur, RRR  Respiratory: Clear to auscultation bilaterally with equal chest rise  Abdomen: Soft, nontender, nondistended, + bowel sounds  Extremities: warm dry without cyanosis clubbing or edema  Neuro: AAOx3, nonfocal  Skin: Jaundice  Psych: Appropriate mood and affect   Data Reviewed: I have personally reviewed following labs and imaging studies  CBC: Recent Labs  Lab 11/22/17 1908 11/23/17 0112 11/24/17 0822 11/25/17 0526 11/26/17 0330  WBC 7.0 5.2 6.3 7.0 7.1  NEUTROABS 5.2  --  4.1 4.7 5.2  HGB 11.9* 10.4* 10.7* 10.1* 8.9*  HCT 36.1* 32.5* 33.1* 31.0* 27.3*  MCV 85.1 84.6 84.4 83.3 84.3  PLT 233 219 203 205 299   Basic Metabolic Panel: Recent Labs  Lab 11/22/17 1908 11/23/17 0112 11/24/17 0822 11/25/17 0526 11/26/17 0330  NA 134* 133* 132* 133* 136  K 3.6 4.2 3.4* 3.6 4.1  CL 102 106 106 103 103  CO2 17* 14* 16* 18* 21*  GLUCOSE 249* 271* 105* 196* 194*  BUN 55* 52* 48* 44* 48*  CREATININE 2.73* 2.44* 2.91* 2.98* 3.37*  CALCIUM 10.0 8.9 8.7* 8.8* 8.5*  MG  --   --  1.4* 2.0 1.9  PHOS  --   --  3.3 3.0 3.4   GFR: Estimated Creatinine Clearance: 15.2 mL/min (A) (by C-G formula based on SCr of 3.37 mg/dL (H)). Liver Function Tests: Recent Labs  Lab 11/22/17 1908 11/23/17 0112 11/24/17 0822 11/25/17 0526 11/26/17 0330  AST 480* 372* 474* 419* 241*  ALT 571* 405* 424* 377* 282*  ALKPHOS 2,477* 1,983* 2,089* 2,000* 1,546*  BILITOT 36.0* 29.0* 28.7* 28.9* 28.3*  PROT 7.5 5.6* 5.8* 5.5* 5.1*  ALBUMIN 2.9* 2.3* 2.0* 1.9* 1.8*   Recent Labs  Lab 11/22/17 1908 11/25/17 0526  LIPASE 67*  51   No results for input(s): AMMONIA in the last 168 hours. Coagulation Profile: Recent Labs  Lab 11/22/17 1908  INR 1.38   Cardiac Enzymes: No results for input(s): CKTOTAL, CKMB, CKMBINDEX, TROPONINI in the last 168 hours. BNP (last 3 results) No results for input(s): PROBNP in the last 8760 hours. HbA1C: No results for input(s): HGBA1C in the last 72 hours. CBG: Recent Labs  Lab 11/25/17 1734 11/25/17 2152 11/25/17 2231 11/25/17 2337 11/26/17 0720  GLUCAP 174* 42* 64* 150* 143*   Lipid Profile: No results for input(s): CHOL, HDL, LDLCALC, TRIG, CHOLHDL, LDLDIRECT in the last 72 hours. Thyroid Function Tests: No results for input(s): TSH, T4TOTAL, FREET4, T3FREE, THYROIDAB in the last 72 hours. Anemia Panel: No results for input(s): VITAMINB12, FOLATE, FERRITIN, TIBC, IRON, RETICCTPCT in the last 72 hours. Urine analysis:    Component Value Date/Time   COLORURINE AMBER (A) 11/23/2017 1009   APPEARANCEUR CLEAR 11/23/2017 1009   LABSPEC 1.008 11/23/2017 1009   PHURINE 5.0 11/23/2017 1009   GLUCOSEU 50 (A) 11/23/2017 1009   GLUCOSEU NEGATIVE 07/24/2017 0937   HGBUR SMALL (A) 11/23/2017 1009   BILIRUBINUR MODERATE (A) 11/23/2017 1009  KETONESUR NEGATIVE 11/23/2017 1009   PROTEINUR NEGATIVE 11/23/2017 1009   UROBILINOGEN 0.2 07/24/2017 0937   NITRITE NEGATIVE 11/23/2017 1009   LEUKOCYTESUR NEGATIVE 11/23/2017 1009   Sepsis Labs: '@LABRCNTIP'$ (procalcitonin:4,lacticidven:4)  )No results found for this or any previous visit (from the past 240 hour(s)).    Radiology Studies: Mr Abdomen Mrcp Wo Contrast  Result Date: 11/24/2017 CLINICAL DATA:  Jaundice and abdominal pain. Cholelithiasis. Dilated common bile duct on ultrasound. EXAM: MRI ABDOMEN WITHOUT CONTRAST  (INCLUDING MRCP) TECHNIQUE: Multiplanar multisequence MR imaging of the abdomen was performed. Heavily T2-weighted images of the biliary and pancreatic ducts were obtained, and three-dimensional MRCP images  were rendered by post processing. COMPARISON:  Multiple exams, including ultrasound of 11/22/2017 and CT scan from 02/13/2017 FINDINGS: Despite efforts by the technologist and patient, motion artifact is present on today's exam and could not be eliminated. This reduces exam sensitivity and specificity. Lower chest: Moderate cardiomegaly. Interstitial accentuation in the lung bases favoring interstitial edema. There is likely some mild airspace opacity medially in the right lower lobe. Hepatobiliary: There is abrupt truncation of the dilated common bile duct in the pancreatic head. CBD proximal to this measures 1.6 cm in diameter, and back on the CT exam from 02/13/2017 measured only about 3 mm in diameter. There is mild beading of the extrahepatic biliary tree and notable intrahepatic biliary dilatation as well. The gallbladder is contracted around several gallstones. The stones fill the contracted gallbladder. I do not see a stone in the common bile duct. Pancreas: There is new and abnormal dilation of the dorsal pancreatic duct up to 1.3 cm in diameter. This terminates abruptly in the pancreatic head in the same vicinity as the common bile duct. The entire dorsal pancreatic duct is dilated. Pancreatic atrophy noted. On diffusion-weighted imaging such as image 63/4, there appears to be restricted diffusion in the portion of the pancreatic head associated with truncation of the dorsal pancreatic duct and common bile duct. There is also abnormal fullness of this portion of the pancreatic head for example on image 35/3. Spleen: The spleen appears unremarkable but there is perisplenic ascites. Adrenals/Urinary Tract: There is scarring of the left mid to upper kidney laterally. Adrenal glands normal. Stomach/Bowel: Small type 1 hiatal hernia. Vascular/Lymphatic:  Aortoiliac atherosclerotic vascular disease. Other:  Mild perihepatic and perisplenic ascites. Musculoskeletal: Unremarkable IMPRESSION: 1. New prominent  dilatation of the biliary tree and of the dorsal pancreatic duct, probably terminating in the pancreatic head where there appears to be a focus of restricted diffusion and soft tissue prominence. Although we do not have confirmatory postcontrast imaging, and although today's images are somewhat adversely affected by motion artifact, the appearance is considered highly concerning for the possibility of adenocarcinoma of the pancreatic head. I note that the patient's creatinine is very elevated and accordingly postcontrast imaging may not be feasible. Endoscopic ultrasound might be an option to further characterize the pancreatic head. 2. There are multiple gallstones filling the gallbladder, but the truncation in the common bile duct does not appear to have the signal characteristics of these gallstones. 3. Moderate cardiomegaly with interstitial accentuation in the lung bases favoring interstitial edema. There is likely some airspace opacity medially in the right lower lobe. 4. Mild upper abdominal ascites. 5. Scarring of the left kidney. 6. Small type 1 hiatal hernia. 7.  Aortic Atherosclerosis (ICD10-I70.0). Electronically Signed   By: Van Clines M.D.   On: 11/24/2017 21:18   Mr 3d Recon At Scanner  Result Date: 11/24/2017 CLINICAL DATA:  Jaundice and abdominal pain. Cholelithiasis. Dilated common bile duct on ultrasound. EXAM: MRI ABDOMEN WITHOUT CONTRAST  (INCLUDING MRCP) TECHNIQUE: Multiplanar multisequence MR imaging of the abdomen was performed. Heavily T2-weighted images of the biliary and pancreatic ducts were obtained, and three-dimensional MRCP images were rendered by post processing. COMPARISON:  Multiple exams, including ultrasound of 11/22/2017 and CT scan from 02/13/2017 FINDINGS: Despite efforts by the technologist and patient, motion artifact is present on today's exam and could not be eliminated. This reduces exam sensitivity and specificity. Lower chest: Moderate cardiomegaly.  Interstitial accentuation in the lung bases favoring interstitial edema. There is likely some mild airspace opacity medially in the right lower lobe. Hepatobiliary: There is abrupt truncation of the dilated common bile duct in the pancreatic head. CBD proximal to this measures 1.6 cm in diameter, and back on the CT exam from 02/13/2017 measured only about 3 mm in diameter. There is mild beading of the extrahepatic biliary tree and notable intrahepatic biliary dilatation as well. The gallbladder is contracted around several gallstones. The stones fill the contracted gallbladder. I do not see a stone in the common bile duct. Pancreas: There is new and abnormal dilation of the dorsal pancreatic duct up to 1.3 cm in diameter. This terminates abruptly in the pancreatic head in the same vicinity as the common bile duct. The entire dorsal pancreatic duct is dilated. Pancreatic atrophy noted. On diffusion-weighted imaging such as image 63/4, there appears to be restricted diffusion in the portion of the pancreatic head associated with truncation of the dorsal pancreatic duct and common bile duct. There is also abnormal fullness of this portion of the pancreatic head for example on image 35/3. Spleen: The spleen appears unremarkable but there is perisplenic ascites. Adrenals/Urinary Tract: There is scarring of the left mid to upper kidney laterally. Adrenal glands normal. Stomach/Bowel: Small type 1 hiatal hernia. Vascular/Lymphatic:  Aortoiliac atherosclerotic vascular disease. Other:  Mild perihepatic and perisplenic ascites. Musculoskeletal: Unremarkable IMPRESSION: 1. New prominent dilatation of the biliary tree and of the dorsal pancreatic duct, probably terminating in the pancreatic head where there appears to be a focus of restricted diffusion and soft tissue prominence. Although we do not have confirmatory postcontrast imaging, and although today's images are somewhat adversely affected by motion artifact, the  appearance is considered highly concerning for the possibility of adenocarcinoma of the pancreatic head. I note that the patient's creatinine is very elevated and accordingly postcontrast imaging may not be feasible. Endoscopic ultrasound might be an option to further characterize the pancreatic head. 2. There are multiple gallstones filling the gallbladder, but the truncation in the common bile duct does not appear to have the signal characteristics of these gallstones. 3. Moderate cardiomegaly with interstitial accentuation in the lung bases favoring interstitial edema. There is likely some airspace opacity medially in the right lower lobe. 4. Mild upper abdominal ascites. 5. Scarring of the left kidney. 6. Small type 1 hiatal hernia. 7.  Aortic Atherosclerosis (ICD10-I70.0). Electronically Signed   By: Van Clines M.D.   On: 11/24/2017 21:18   Dg Chest Port 1 View  Result Date: 11/26/2017 CLINICAL DATA:  Shortness of breath EXAM: PORTABLE CHEST 1 VIEW COMPARISON:  02/21/2017 FINDINGS: Cardiomegaly with vascular congestion. Interstitial prominence throughout the lungs could reflect interstitial edema. Low lung volumes with bibasilar atelectasis and possible small effusions. IMPRESSION: Cardiomegaly with vascular congestion and probable mild interstitial edema. Bibasilar atelectasis and small effusions. Electronically Signed   By: Rolm Baptise M.D.   On: 11/26/2017 10:10  Dg Ercp Biliary & Pancreatic Ducts  Result Date: 11/26/2017 CLINICAL DATA:  ERCP with partial sphincterotomy. Known pancreatic mass. EXAM: ERCP TECHNIQUE: Multiple spot images obtained with the fluoroscopic device and submitted for interpretation post-procedure. COMPARISON:  MRCP - 11/24/2017 FLUOROSCOPY TIME:  3 minutes, 17 seconds FINDINGS: A single fluoroscopic sequence of the right upper abdominal quadrant during ERCP is provided for review Provided sequence demonstrates apparent selective cannulation and opacification of the  presumed pancreatic duct which appears moderate to markedly dilated as demonstrated on preceding MRCP. IMPRESSION: ERCP as above. These images were submitted for radiologic interpretation only. Please see the procedural report for the amount of contrast and the fluoroscopy time utilized. Electronically Signed   By: Sandi Mariscal M.D.   On: 11/26/2017 14:04     Scheduled Meds: . amLODipine  5 mg Oral Daily  . feeding supplement (ENSURE ENLIVE)  237 mL Oral BID BM  . gabapentin  300 mg Oral BID  . heparin  5,000 Units Subcutaneous Q8H  . Influenza vac split quadrivalent PF  0.5 mL Intramuscular Tomorrow-1000  . insulin aspart  0-15 Units Subcutaneous TID WC  . insulin glargine  16 Units Subcutaneous Daily  . levothyroxine  137 mcg Oral QAC breakfast  . tamsulosin  0.4 mg Oral Daily   Continuous Infusions: . lactated ringers Stopped (11/26/17 0604)  . piperacillin-tazobactam (ZOSYN)  IV Stopped (11/26/17 1554)     LOS: 4 days   Time Spent in minutes   45 minutes  Bertha Earwood D.O. on 11/26/2017 at 4:04 PM  Between 7am to 7pm - Please see pager noted on [email or web address]  After 7pm go to [email or web address]  And look for the night coverage person covering for me after hours  [facility] Office  [phone]

## 2017-11-26 NOTE — Progress Notes (Signed)
Patient has been wheeled to ERCP procedure.

## 2017-11-26 NOTE — H&P (View-Only) (Signed)
Patient ID: Zachary Cook, male   DOB: September 26, 1932, 84 y.o.   MRN: [id number]    Progress Note   Subjective  Day # 5 hospital stay    Alert, seems a little stronger than my previous visit. Marland Kitchen No c/o SOB, no nausea or pain, no fever.  CXR - done -pending   Objective   Vital signs in last 24 hours: Temp:  [97.6 F (36.4 C)-99.3 F (37.4 C)] 97.6 F (36.4 C) (09/18 0507) Pulse Rate:  [51-71] 51 (09/18 0507) Resp:  [18-20] 18 (09/18 0507) BP: (83-121)/(49-80) 118/74 (09/18 0507) SpO2:  [94 %-98 %] 94 % (09/18 0507) Weight:  [73.4 kg] 73.4 kg (09/17 1125) Last BM Date: 11/25/17 General:   elderly WM, deeply jaundicedin NAD Heart:  Regular rate and rhythm; no murmurs Lungs: Respirations even and unlabored, lungs CTA bilaterally Abdomen:  Soft, nontender and nondistended. Normal bowel sounds. Extremities:  Without edema., jaundiced Neurologic:  Alert and oriented,  grossly normal neurologically. Psych:  Cooperative. Normal mood and affect.  Intake/Output from previous day: 09/17 0701 - 09/18 0700 In: 1393.8 [P.O.:240; I.V.:1003.8; IV Piggyback:150] Out: 2542 [Urine:1200; Stool:1] Intake/Output this shift: No intake/output data recorded.  Lab Results: Recent Labs    11/24/17 0822 11/25/17 0526 11/26/17 0330  WBC 6.3 7.0 7.1  HGB 10.7* 10.1* 8.9*  HCT 33.1* 31.0* 27.3*  PLT 203 205 175   BMET Recent Labs    11/24/17 0822 11/25/17 0526 11/26/17 0330  NA 132* 133* 136  K 3.4* 3.6 4.1  CL 106 103 103  CO2 16* 18* 21*  GLUCOSE 105* 196* 194*  BUN 48* 44* 48*  CREATININE 2.91* 2.98* 3.37*  CALCIUM 8.7* 8.8* 8.5*   LFT Recent Labs    11/26/17 0330  PROT 5.1*  ALBUMIN 1.8*  AST 241*  ALT 282*  ALKPHOS 1,546*  BILITOT 28.3*   PT/INR No results for input(s): LABPROT, INR in the last 72 hours.  Studies/Results: Ct Head Wo Contrast  Result Date: 11/24/2017 CLINICAL DATA:  84 year old male with a history of fall and weakness EXAM: CT HEAD WITHOUT CONTRAST  TECHNIQUE: Contiguous axial images were obtained from the base of the skull through the vertex without intravenous contrast. COMPARISON:  None. FINDINGS: Brain: No acute intracranial hemorrhage. No midline shift or mass effect. Gray-white differentiation maintained. Mild cerebral volume loss. Unremarkable appearance of the ventricle configuration. Hypodensity in the bilateral basal ganglia. Vascular: Calcifications of the intracranial vasculature. Skull: No acute displaced fracture. No scalp hematoma. No radiopaque foreign body. Sinuses/Orbits: No acute finding. Other: None. IMPRESSION: Negative for acute intracranial abnormality. Evidence of chronic microvascular ischemic disease and associated intracranial atherosclerosis. Electronically Signed   By: Corrie Mckusick D.O.   On: 11/24/2017 11:36   Mr Abdomen Mrcp Wo Contrast  Result Date: 11/24/2017 CLINICAL DATA:  Jaundice and abdominal pain. Cholelithiasis. Dilated common bile duct on ultrasound. EXAM: MRI ABDOMEN WITHOUT CONTRAST  (INCLUDING MRCP) TECHNIQUE: Multiplanar multisequence MR imaging of the abdomen was performed. Heavily T2-weighted images of the biliary and pancreatic ducts were obtained, and three-dimensional MRCP images were rendered by post processing. COMPARISON:  Multiple exams, including ultrasound of 11/22/2017 and CT scan from 02/13/2017 FINDINGS: Despite efforts by the technologist and patient, motion artifact is present on today's exam and could not be eliminated. This reduces exam sensitivity and specificity. Lower chest: Moderate cardiomegaly. Interstitial accentuation in the lung bases favoring interstitial edema. There is likely some mild airspace opacity medially in the right lower lobe. Hepatobiliary: There is abrupt truncation  of the dilated common bile duct in the pancreatic head. CBD proximal to this measures 1.6 cm in diameter, and back on the CT exam from 02/13/2017 measured only about 3 mm in diameter. There is mild beading of  the extrahepatic biliary tree and notable intrahepatic biliary dilatation as well. The gallbladder is contracted around several gallstones. The stones fill the contracted gallbladder. I do not see a stone in the common bile duct. Pancreas: There is new and abnormal dilation of the dorsal pancreatic duct up to 1.3 cm in diameter. This terminates abruptly in the pancreatic head in the same vicinity as the common bile duct. The entire dorsal pancreatic duct is dilated. Pancreatic atrophy noted. On diffusion-weighted imaging such as image 63/4, there appears to be restricted diffusion in the portion of the pancreatic head associated with truncation of the dorsal pancreatic duct and common bile duct. There is also abnormal fullness of this portion of the pancreatic head for example on image 35/3. Spleen: The spleen appears unremarkable but there is perisplenic ascites. Adrenals/Urinary Tract: There is scarring of the left mid to upper kidney laterally. Adrenal glands normal. Stomach/Bowel: Small type 1 hiatal hernia. Vascular/Lymphatic:  Aortoiliac atherosclerotic vascular disease. Other:  Mild perihepatic and perisplenic ascites. Musculoskeletal: Unremarkable IMPRESSION: 1. New prominent dilatation of the biliary tree and of the dorsal pancreatic duct, probably terminating in the pancreatic head where there appears to be a focus of restricted diffusion and soft tissue prominence. Although we do not have confirmatory postcontrast imaging, and although today's images are somewhat adversely affected by motion artifact, the appearance is considered highly concerning for the possibility of adenocarcinoma of the pancreatic head. I note that the patient's creatinine is very elevated and accordingly postcontrast imaging may not be feasible. Endoscopic ultrasound might be an option to further characterize the pancreatic head. 2. There are multiple gallstones filling the gallbladder, but the truncation in the common bile duct  does not appear to have the signal characteristics of these gallstones. 3. Moderate cardiomegaly with interstitial accentuation in the lung bases favoring interstitial edema. There is likely some airspace opacity medially in the right lower lobe. 4. Mild upper abdominal ascites. 5. Scarring of the left kidney. 6. Small type 1 hiatal hernia. 7.  Aortic Atherosclerosis (ICD10-I70.0). Electronically Signed   By: Van Clines M.D.   On: 11/24/2017 21:18   Mr 3d Recon At Scanner  Result Date: 11/24/2017 CLINICAL DATA:  Jaundice and abdominal pain. Cholelithiasis. Dilated common bile duct on ultrasound. EXAM: MRI ABDOMEN WITHOUT CONTRAST  (INCLUDING MRCP) TECHNIQUE: Multiplanar multisequence MR imaging of the abdomen was performed. Heavily T2-weighted images of the biliary and pancreatic ducts were obtained, and three-dimensional MRCP images were rendered by post processing. COMPARISON:  Multiple exams, including ultrasound of 11/22/2017 and CT scan from 02/13/2017 FINDINGS: Despite efforts by the technologist and patient, motion artifact is present on today's exam and could not be eliminated. This reduces exam sensitivity and specificity. Lower chest: Moderate cardiomegaly. Interstitial accentuation in the lung bases favoring interstitial edema. There is likely some mild airspace opacity medially in the right lower lobe. Hepatobiliary: There is abrupt truncation of the dilated common bile duct in the pancreatic head. CBD proximal to this measures 1.6 cm in diameter, and back on the CT exam from 02/13/2017 measured only about 3 mm in diameter. There is mild beading of the extrahepatic biliary tree and notable intrahepatic biliary dilatation as well. The gallbladder is contracted around several gallstones. The stones fill the contracted gallbladder. I do  not see a stone in the common bile duct. Pancreas: There is new and abnormal dilation of the dorsal pancreatic duct up to 1.3 cm in diameter. This terminates  abruptly in the pancreatic head in the same vicinity as the common bile duct. The entire dorsal pancreatic duct is dilated. Pancreatic atrophy noted. On diffusion-weighted imaging such as image 63/4, there appears to be restricted diffusion in the portion of the pancreatic head associated with truncation of the dorsal pancreatic duct and common bile duct. There is also abnormal fullness of this portion of the pancreatic head for example on image 35/3. Spleen: The spleen appears unremarkable but there is perisplenic ascites. Adrenals/Urinary Tract: There is scarring of the left mid to upper kidney laterally. Adrenal glands normal. Stomach/Bowel: Small type 1 hiatal hernia. Vascular/Lymphatic:  Aortoiliac atherosclerotic vascular disease. Other:  Mild perihepatic and perisplenic ascites. Musculoskeletal: Unremarkable IMPRESSION: 1. New prominent dilatation of the biliary tree and of the dorsal pancreatic duct, probably terminating in the pancreatic head where there appears to be a focus of restricted diffusion and soft tissue prominence. Although we do not have confirmatory postcontrast imaging, and although today's images are somewhat adversely affected by motion artifact, the appearance is considered highly concerning for the possibility of adenocarcinoma of the pancreatic head. I note that the patient's creatinine is very elevated and accordingly postcontrast imaging may not be feasible. Endoscopic ultrasound might be an option to further characterize the pancreatic head. 2. There are multiple gallstones filling the gallbladder, but the truncation in the common bile duct does not appear to have the signal characteristics of these gallstones. 3. Moderate cardiomegaly with interstitial accentuation in the lung bases favoring interstitial edema. There is likely some airspace opacity medially in the right lower lobe. 4. Mild upper abdominal ascites. 5. Scarring of the left kidney. 6. Small type 1 hiatal hernia. 7.   Aortic Atherosclerosis (ICD10-I70.0). Electronically Signed   By: Van Clines M.D.   On: 11/24/2017 21:18       Assessment / Plan:     #1 84 yo WM with painless jaundice , weight loss, anorexia, and MRCP concerning for pancreatic head mass with biliary obstruction , also numerous gallstones  CA 19 -9 markedly elevated   #2 AKI onCKD- creat on the rise  #3 CHF- sats good, CXR Pending  #4 AODM  Plan; Pt scheduled fro ERCP/brushings, stent today  Continue Zosyn  Remains on SQ heparin Oncology has seen and plan will be for palliative care        Contact  Aryan Sparks, P.A.-C               [phone]      Active Problems:   Type 2 diabetes mellitus ([place])   Hypertension   AKI (acute kidney injury) ([place])   S/P hernia repair   Biliary obstruction   Elevated transaminase level   Jaundice   Hyperbilirubinemia   Elevated alkaline phosphatase level   Loss of weight     LOS: 4 days   Samnang Shugars  11/26/2017, 8:45 AM

## 2017-11-27 DIAGNOSIS — R531 Weakness: Secondary | ICD-10-CM

## 2017-11-27 DIAGNOSIS — Z66 Do not resuscitate: Secondary | ICD-10-CM

## 2017-11-27 DIAGNOSIS — Z515 Encounter for palliative care: Secondary | ICD-10-CM

## 2017-11-27 LAB — COMPREHENSIVE METABOLIC PANEL
ALBUMIN: 1.9 g/dL — AB (ref 3.5–5.0)
ALK PHOS: 1434 U/L — AB (ref 38–126)
ALT: 226 U/L — AB (ref 0–44)
AST: 143 U/L — ABNORMAL HIGH (ref 15–41)
Anion gap: 12 (ref 5–15)
BUN: 51 mg/dL — ABNORMAL HIGH (ref 8–23)
CALCIUM: 8.6 mg/dL — AB (ref 8.9–10.3)
CO2: 20 mmol/L — AB (ref 22–32)
CREATININE: 3.86 mg/dL — AB (ref 0.61–1.24)
Chloride: 106 mmol/L (ref 98–111)
GFR calc non Af Amer: 13 mL/min — ABNORMAL LOW (ref >60)
GFR, EST AFRICAN AMERICAN: 15 mL/min — AB (ref >60)
GLUCOSE: 66 mg/dL — AB (ref 70–99)
Potassium: 3.6 mmol/L (ref 3.5–5.1)
SODIUM: 138 mmol/L (ref 135–145)
Total Bilirubin: 33.1 mg/dL (ref 0.3–1.2)
Total Protein: 5.2 g/dL — ABNORMAL LOW (ref 6.5–8.1)

## 2017-11-27 LAB — CBC
HCT: 29.7 % — ABNORMAL LOW (ref 39.0–52.0)
HEMOGLOBIN: 9.5 g/dL — AB (ref 13.0–17.0)
MCH: 27.1 pg (ref 26.0–34.0)
MCHC: 32 g/dL (ref 30.0–36.0)
MCV: 84.6 fL (ref 78.0–100.0)
Platelets: 140 10*3/uL — ABNORMAL LOW (ref 150–400)
RBC: 3.51 MIL/uL — AB (ref 4.22–5.81)
RDW: 19.2 % — ABNORMAL HIGH (ref 11.5–15.5)
WBC: 5.1 10*3/uL (ref 4.0–10.5)

## 2017-11-27 LAB — GLUCOSE, CAPILLARY
GLUCOSE-CAPILLARY: 126 mg/dL — AB (ref 70–99)
GLUCOSE-CAPILLARY: 101 mg/dL — AB (ref 70–99)
Glucose-Capillary: 33 mg/dL — CL (ref 70–99)
Glucose-Capillary: 111 mg/dL — ABNORMAL HIGH (ref 70–99)
Glucose-Capillary: 187 mg/dL — ABNORMAL HIGH (ref 70–99)

## 2017-11-27 MED ORDER — GABAPENTIN 300 MG PO CAPS
300.0000 mg | ORAL_CAPSULE | Freq: Every day | ORAL | Status: DC
  Administered 2017-11-27: 300 mg via ORAL
  Filled 2017-11-27: qty 1

## 2017-11-27 MED ORDER — DEXTROSE 50 % IV SOLN
50.0000 mL | Freq: Once | INTRAVENOUS | Status: AC
  Administered 2017-11-27 – 2017-11-28 (×3): 50 mL via INTRAVENOUS
  Filled 2017-11-27: qty 50

## 2017-11-27 MED ORDER — HYDROCODONE-ACETAMINOPHEN 5-325 MG PO TABS: 1.0000 | ORAL_TABLET | Freq: Four times a day (QID) | ORAL | Status: DC | PRN

## 2017-11-27 NOTE — Consult Note (Signed)
Renal Service Consult Note [facility] 11/27/2017 Sol Blazing Requesting Physician:  Dr Ree Kida  Reason for Consult:  Acute on CRF HPI: The patient is a 84 y.o. year-old w/ hx of HL, HTN, GIB, DM on insulin CKDIII, and kidney stones presented on 11/22/17 w/ nausea/ vomiting, fatigue, wt loss 30 lbs in 5 wks, jaundice 1 week.   Labs showed biliary obstruction.  Pt was admitted. GI saw pt w/ dx of painless jaundice, concern for malignant issue.  MRCP showed gallstones and dilated CBD w/ concern for possible adenoCa of panc head. ERCP attempted on 9/18 was not successful.  2nd attempt ERCP planned for 9/23.  On IV abx.  Creat on admit was up at 2.73 (baseline 1.2- 1.7) and is up now to 3.8 today.  Asked to see for renal failure.   Pt has no complaints.  Denies hx of kidney problems. No voidign issues.   IP meds : rec'd IV lasix and indocin on 9/17.  No contrast or acei/ ARB.   Renal US showed 10- 11 cm kidneys w/o hydro, +^'d cortical echo bilat, no hydro  UA 9/15 was negative     date  Creat  egfr 2014- 15 1.2- 1.4  2016- 17 1.2 - 1.8  2018  1.2- 1.5  feb '19  1.5  46  mar '19 1.5  46  April '19 1.4    may '19 1.2- 1.5 43- 60  Sept '19  2.7 >>> 3.8 today   ROS  denies CP  no joint pain   no HA  no blurry vision  no rash  no diarrhea  no nausea/ vomiting  no dysuria  no difficulty voiding  no change in urine color    Past Medical History  Past Medical History:  Diagnosis Date  . Barrett's esophagus   . Bilateral inguinal hernia   . Cardiomegaly 02/21/2017   noted on CXR  . Cholelithiasis   . Chronic kidney disease    stage 3 Goes to [place] kidney  . Diabetes mellitus without complication ([place])    type 2  . Diverticulosis   . Duodenitis   . GERD (gastroesophageal reflux disease)   . Hiatal hernia   . History of blood transfusion 05/2016  . History of GI bleed   . History of hyperkalemia 02/2017  . History of kidney  stones    35 years ago  . Hyperlipemia   . Hypertension   . Hypogonadism in male   . Hypothyroidism   . Ileus following gastrointestinal surgery ([place]) 06/12/2016  . Skin cancer    right arm treated by dermatologist  . Syncope 06/03/2016  . Umbilical hernia    Small  . Ventral hernia    Anterior   Past Surgical History  Past Surgical History:  Procedure Laterality Date  . COLONOSCOPY    . ESOPHAGOGASTRODUODENOSCOPY    . INCISIONAL HERNIA REPAIR N/A 08/01/2017   Procedure: OPEN REPAIR INICISIONAL HERNIA;  Surgeon: Ralene Ok, MD;  Location: [facility];  Service: General;  Laterality: N/A;  TAP BLOCK  . INSERTION OF MESH N/A 08/01/2017   Procedure: INSERTION OF MESH;  Surgeon: Ralene Ok, MD;  Location: [facility];  Service: General;  Laterality: N/A;  . IR GENERIC HISTORICAL  05/28/2016   IR ANGIOGRAM SELECTIVE EACH ADDITIONAL VESSEL 05/28/2016 Aletta Edouard, MD [facility]  . IR GENERIC HISTORICAL  05/28/2016   IR ANGIOGRAM VISCERAL SELECTIVE 05/28/2016 Aletta Edouard, MD [facility]  . IR  GENERIC HISTORICAL  05/28/2016   IR US GUIDE VASC ACCESS RIGHT 05/28/2016 Aletta Edouard, MD [facility]  . IR GENERIC HISTORICAL  05/28/2016   IR ANGIOGRAM VISCERAL SELECTIVE 05/28/2016 Arne Cleveland, MD [facility]  . IR GENERIC HISTORICAL  05/28/2016   IR ANGIOGRAM SELECTIVE EACH ADDITIONAL VESSEL 05/28/2016 Arne Cleveland, MD [facility]  . IR GENERIC HISTORICAL  05/28/2016   IR EMBO ART  VEN HEMORR LYMPH EXTRAV  INC GUIDE ROADMAPPING 05/28/2016 Arne Cleveland, MD [facility]  . IR GENERIC HISTORICAL  05/28/2016   IR US GUIDE VASC ACCESS RIGHT 05/28/2016 Arne Cleveland, MD [facility]  . PARTIAL COLECTOMY N/A 06/06/2016   Procedure: OPEN ASCENDING COLECTOMY;  Surgeon: Stark Klein, MD;  Location: [facility];  Service: General;  Laterality: N/A;  . VASECTOMY     Family History  Family History  Problem Relation Age of Onset  . Heart disease Mother   . Diabetes Mother   . Cancer Father    . Heart attack Son    Social History  reports that he has never smoked. He has never used smokeless tobacco. He reports that he does not drink alcohol or use drugs. Allergies No Known Allergies Home medications Prior to Admission medications   Medication Sig Start Date End Date Taking? Authorizing Provider  ACCU-CHEK SMARTVIEW test strip USE TO TEST TWICE DAILY Patient taking differently: 1 each by Other route 2 (two) times daily.  04/07/17  Yes Elayne Snare, MD  amLODipine (NORVASC) 5 MG tablet TAKE 1 TABLET(5 MG) BY MOUTH DAILY Patient taking differently: Take 5 mg by mouth daily.  07/14/17  Yes [name], MD  aspirin EC 81 MG tablet Take 81 mg by mouth daily.   Yes [provider]  atorvastatin (LIPITOR) 10 MG tablet TAKE 1 TABLET(10 MG) BY MOUTH DAILY AT 6 PM Patient taking differently: Take 10 mg by mouth daily at 6 PM.  07/07/17  Yes [name], MD  B-D UF III MINI PEN NEEDLES 31G X 5 MM MISC USE ONE PER DAY WITH VICTOZA Patient taking differently: Inject 1 each into the skin daily.  09/25/17  Yes Elayne Snare, MD  esomeprazole (NEXIUM) 40 MG capsule TAKE ONE CAPSULE BY MOUTH DAILY AT NOON Patient taking differently: Take 40 mg by mouth daily at 12 noon.  08/12/17  Yes [name], MD  glimepiride (AMARYL) 2 MG tablet TAKE 2 TABLETS(4 MG) BY MOUTH DAILY BEFORE DINNER Patient taking differently: Take 4 mg by mouth daily with breakfast.  08/20/17  Yes Elayne Snare, MD  Insulin Glargine (TOUJEO SOLOSTAR) 300 UNIT/ML SOPN Inject 16 Units into the skin daily. Patient taking differently: Inject 20 Units into the skin every evening.  07/07/17  Yes Elayne Snare, MD  levothyroxine (SYNTHROID, LEVOTHROID) 137 MCG tablet TAKE 1 TABLET(137 MCG) BY MOUTH DAILY Patient taking differently: Take 137 mcg by mouth daily before breakfast.  08/14/17  Yes Elayne Snare, MD  metFORMIN (GLUCOPHAGE-XR) 500 MG 24 hr tablet Take 2 tablets (1,000 mg total) by mouth daily with supper. 05/09/17  Yes  Elayne Snare, MD  tamsulosin (FLOMAX) 0.4 MG CAPS capsule TAKE 1 CAPSULE BY MOUTH DAILY Patient taking differently: Take 0.4 mg by mouth daily.  09/10/17  Yes [name], MD  VICTOZA 18 MG/3ML SOPN ADMINISTER 1.2 MG UNDER THE SKIN DAILY Patient taking differently: Inject 1.2 mg into the skin daily. ADMINISTER 1.2 MG UNDER THE SKIN DAILY 11/13/17  Yes Elayne Snare, MD  gabapentin (NEURONTIN) 300 MG capsule  Take 1 capsule (300 mg total) by mouth 2 (two) times daily. Patient not taking: Reported on 11/22/2017 08/03/17   Alphonsa Overall, MD  HYDROcodone-acetaminophen (NORCO/VICODIN) 5-325 MG tablet Take 1 tablet by mouth every 6 (six) hours as needed for moderate pain. Patient not taking: Reported on 11/22/2017 08/03/17   Alphonsa Overall, MD   Liver Function Tests Recent Labs  Lab 11/25/17 0526 11/26/17 0330 11/27/17 0414  AST 419* 241* 143*  ALT 377* 282* 226*  ALKPHOS 2,000* 1,546* 1,434*  BILITOT 28.9* 28.3* 33.1*  PROT 5.5* 5.1* 5.2*  ALBUMIN 1.9* 1.8* 1.9*   Recent Labs  Lab 11/22/17 1908 11/25/17 0526  LIPASE 67* 51   CBC Recent Labs  Lab 11/24/17 0822 11/25/17 0526 11/26/17 0330 11/27/17 0414  WBC 6.3 7.0 7.1 5.1  NEUTROABS 4.1 4.7 5.2  --   HGB 10.7* 10.1* 8.9* 9.5*  HCT 33.1* 31.0* 27.3* 29.7*  MCV 84.4 83.3 84.3 84.6  PLT 203 205 175 353*   Basic Metabolic Panel Recent Labs  Lab 11/22/17 1908 11/23/17 0112 11/24/17 0822 11/25/17 0526 11/26/17 0330 11/27/17 0414  NA 134* 133* 132* 133* 136 138  K 3.6 4.2 3.4* 3.6 4.1 3.6  CL 102 106 106 103 103 106  CO2 17* 14* 16* 18* 21* 20*  GLUCOSE 249* 271* 105* 196* 194* 66*  BUN 55* 52* 48* 44* 48* 51*  CREATININE 2.73* 2.44* 2.91* 2.98* 3.37* 3.86*  CALCIUM 10.0 8.9 8.7* 8.8* 8.5* 8.6*  PHOS  --   --  3.3 3.0 3.4  --    Iron/TIBC/Ferritin/ %Sat No results found for: IRON, TIBC, FERRITIN, IRONPCTSAT  Vitals:   11/26/17 1440 11/26/17 1518 11/26/17 2130 11/27/17 0642  BP: (!) 97/47 (!) 97/54 (!) 99/55 (!) 91/55   Pulse: (!) 59 (!) 59 79 (!) 59  Resp: (!) _0 Temp:  98.7 F (37.1 C)  97.6 F (36.4 C)  TempSrc:    Oral  SpO2: (!) 86% (!) 88% 97% 97%  Weight:      Height:       Exam Gen alert, marked jaundice, no distress, HOH No rash, cyanosis or gangrene Sclera anicteric, throat clear  No jvd or bruits Chest clear bilat to bases RRR no MRG Abd soft ntnd no mass or ascites +bs, protbuerant GU normal male MS no joint effusions or deformity Ext no LE or UE edema, no wounds or ulcers Neuro is alert, Ox 3 , nf, +myoclonic jerking    Home meds:  - amlodipine 5 qd  - victoza 1.100m qd/ metformin 1gm qd/ insulin glargine 16u/ glimepiride 428mqd  - aspirin 81/ atorvastatin 10 qd/ esomeprazole 40 qd/ T4 137ug/ tamsulosin 0.4  - gabapentin 300 bid/ hydrocodone-acetaminophen 5-325 q 6 prn  IP meds : rec'd IV lasix and indocin on 9/17.  No contrast or acei/ ARB.   Renal [place]ore[place]owed 10- 11 cm kidneys w/o hydro, +^'d cortical echo bilat, no hydro  UA 9/15 was negative  I/O here > 11L in and 3 L out, +8L    Impression: 1. AKI on CKD 3 - baseline creat 1.4.  Unclear cause, USKoreand UA are unremarkable, did get nsaids and IV lasix on 9/17. Suspect hypoperfusion/ nsaids/ soft BP's causing ^cWorthy Flank  Will check urine lytes.  Cont cautious IVF's. He is 8 L + but not grossly vol overloaded.  Hopefully will recover w/ supportive care. Avoid contrast/ acei/ arb/ nsaids. Will follow 2. Painless jaundice/6LFT's/ biliary obstruction - w/u  in progress 3. HTN - bp's meds on hold 4. DM 2 - long standing 5. CKD 3 - baseline creat 1.2. -1.5 6. Myoclonic jerking - will dc gabapentin   Plan - as above  Kelly Splinter MD [employer] pager [phone]   11/27/2017, 2:21 PM

## 2017-11-27 NOTE — Progress Notes (Signed)
Pt has tried 3-4 times today to get oob. He is on a low bed with mats on the floor. I have orderd a Automotive engineer

## 2017-11-27 NOTE — Progress Notes (Signed)
PROGRESS NOTE    Zachary Cook  NAT:[id number] DOB: 08/22/1932 DOA: 11/22/2017 PCP: [name], MD   Brief Narrative:  HPI On 11/22/2017 by Dr. Laurey Arrow Zachary Cook is a 84 y.o. male with medical history significant for hypothyroid, htn, gi bleed (diverticulosis, s/p partial colectomy), incisional hernia repair 07/2017, T2DM, who presents with above.  Symptoms began 5 wks ago and have progressively worsened. Began feeling fatigued, then at times dizzy when ambulating. Then developed nausea, worse with eating, with vomiting after most meals. PO intake decreased, says has lost 30-40 pounds. Began to develop jaundice about a week ago. Says was evaluated by PCP early in this course and says he thinks pcp wasn't much concerned, sounds like symptoms mild at that time. Also had an episode of white/tan stools that have resolved. No fevers. No abdominal pain. No diarrhea, no blood in stool. No new meds. Not a drinker. No history liver or gallbladder problems. No history cancer.  Interim history  Presented with jaundice, nausea and vomiting.  Admitted and found to have hyperbilirubinemia along with elevated LFTs.  GI consulted, MRCP done.  S/p unsuccessful ERCP on 9/18. Pending further GI recommendations.  Assessment & Plan   Severe painless jaundice/hyperbilirubinemia/elevated LFTs secondary to pancreatitic head mass -Abdominal ultrasound showed contracted gallbladder with moderate cholelithiasis.  Mild dilatation of common bile duct.  Mild hepatic steatosis. -LFTs as well as bilirubin and alk phos all elevated upon admission, however trending downward -Gastroenterology consulted and appreciated recommended MRCP which showed no prominent dilatation of the biliary tree and of the dorsal pancreatic duct, findings concerning for possibility of adenocarcinoma pancreatic head.  Multiple gallstones. -Status post unsuccessful ERCP on 11/26/17 -GI planning for another ERCP attempt on  12/01/2017 -Continue IV Zosyn as well as antiemetics -Oncology consulted and appreciated  Acute kidney injury on chronic kidney disease, stage III with metabolic acidosis -Likely secondary to poor oral intake -Creatinine baseline approximately 1.5-1.6, on admission 2.73, currently 3.86 -Continue IV fluids and monitor BMP -Renal US: Medical renal disease, no hydronephrosis -Avoid nephrotoxic agents  -of note, patient was given IV lasix and indomethacin on 11/25/2017 -Nephrology consulted and appreciated -BP mildly low  Diabetes mellitus, type II -Metformin, Victoza, glipizide held -Continue insulin, Lantus, CBG monitoring  Essential hypertension -will hold amlodipine   Hyperlipidemia -Statin held due to elevated LFTs  BPH -Continue tamsulosin  Hypothyroidism -Continue Synthroid, TSH checked 0.949  Peripheral neuropathy -Continue gabapentin  Hypomagnesemia -Resolved with replacement  Hypokalemia -Resolved with replacement -Continue to monitor BMP  Chronic normocytic Anemia -hemoglobin currently 9.5 -baseline hemoglobin around 11-12, however April 2019, Hemoglobin was around 8-9  Fall -Unwitnessed in the bathroom -CT head unremarkable for acute intracranial abnormalities  DVT Prophylaxis  Heparin  Code Status: DNR  Family Communication: None at bedside  Disposition Plan: Admitted. Pending further recommendations from palliative care,  Oncology, GI- repeat ERCP on 9/23  Consultants Gastroenterology Oncology Palliative Care  Procedures  MRCP ERCP  Antibiotics   Anti-infectives (From admission, onward)   Start     Dose/Rate Route Frequency Ordered Stop   11/24/17 1400  piperacillin-tazobactam (ZOSYN) IVPB 2.25 g     2.25 g 100 mL/hr over 30 Minutes Intravenous Every 8 hours 11/24/17 1043        Subjective:   Zachary Cook seen and examined today.  Patient has no complaints today. Denies current chest pain, shortness of breath, abdominal pain, N/V,  dizziness, headache.  Objective:   Vitals:   11/26/17 1440 11/26/17 1518 11/26/17  2130 11/27/17 0642  BP: (!) 97/47 (!) 97/54 (!) 99/55 (!) 91/55  Pulse: (!) 59 (!) 59 79 (!) 59  Resp: (!) _0 Temp:  98.7 F (37.1 C)  97.6 F (36.4 C)  TempSrc:    Oral  SpO2: (!) 86% (!) 88% 97% 97%  Weight:      Height:        Intake/Output Summary (Last 24 hours) at 11/27/2017 1310 Last data filed at 11/27/2017 0200 Gross per 24 hour  Intake 3659.79 ml  Output -  Net 3659.79 ml   Filed Weights   11/22/17 1859 11/25/17 1125  Weight: 66.7 kg 73.4 kg   Exam  General: Well developed, well nourished, NAD  HEENT: NCAT, mucous membranes moist. Scleral icterus  Neck: Supple  Cardiovascular: S1 S2 auscultated, no murmur, RRR  Respiratory: Clear to auscultation bilaterally with equal chest rise  Abdomen: Soft, nontender, nondistended, + bowel sounds  Extremities: warm dry without cyanosis clubbing or edema  Neuro: AAOx2, nonfocal  Skin: jaundice  Psych: Appropriate mood and affect  Data Reviewed: I have personally reviewed following labs and imaging studies  CBC: Recent Labs  Lab 11/22/17 1908 11/23/17 0112 11/24/17 0822 11/25/17 0526 11/26/17 0330 11/27/17 0414  WBC 7.0 5.2 6.3 7.0 7.1 5.1  NEUTROABS 5.2  --  4.1 4.7 5.2  --   HGB 11.9* 10.4* 10.7* 10.1* 8.9* 9.5*  HCT 36.1* 32.5* 33.1* 31.0* 27.3* 29.7*  MCV 85.1 84.6 84.4 83.3 84.3 84.6  PLT 233 219 203 205 175 867*   Basic Metabolic Panel: Recent Labs  Lab 11/23/17 0112 11/24/17 0822 11/25/17 0526 11/26/17 0330 11/27/17 0414  NA 133* 132* 133* 136 138  K 4.2 3.4* 3.6 4.1 3.6  CL 106 106 103 103 106  CO2 14* 16* 18* 21* 20*  GLUCOSE 271* 105* 196* 194* 66*  BUN 52* 48* 44* 48* 51*  CREATININE 2.44* 2.91* 2.98* 3.37* 3.86*  CALCIUM 8.9 8.7* 8.8* 8.5* 8.6*  MG  --  1.4* 2.0 1.9  --   PHOS  --  3.3 3.0 3.4  --    GFR: Estimated Creatinine Clearance: 13.3 mL/min (A) (by C-G formula based on SCr of  3.86 mg/dL (H)). Liver Function Tests: Recent Labs  Lab 11/23/17 0112 11/24/17 0822 11/25/17 0526 11/26/17 0330 11/27/17 0414  AST 372* 474* 419* 241* 143*  ALT 405* 424* 377* 282* 226*  ALKPHOS 1,983* 2,089* 2,000* 1,546* 1,434*  BILITOT 29.0* 28.7* 28.9* 28.3* 33.1*  PROT 5.6* 5.8* 5.5* 5.1* 5.2*  ALBUMIN 2.3* 2.0* 1.9* 1.8* 1.9*   Recent Labs  Lab 11/22/17 1908 11/25/17 0526  LIPASE 67* 51   No results for input(s): AMMONIA in the last 168 hours. Coagulation Profile: Recent Labs  Lab 11/22/17 1908  INR 1.38   Cardiac Enzymes: No results for input(s): CKTOTAL, CKMB, CKMBINDEX, TROPONINI in the last 168 hours. BNP (last 3 results) No results for input(s): PROBNP in the last 8760 hours. HbA1C: No results for input(s): HGBA1C in the last 72 hours. CBG: Recent Labs  Lab 11/26/17 1630 11/26/17 2131 11/27/17 0731 11/27/17 0830 11/27/17 1215  GLUCAP 113* 137* 33* 111* 126*   Lipid Profile: No results for input(s): CHOL, HDL, LDLCALC, TRIG, CHOLHDL, LDLDIRECT in the last 72 hours. Thyroid Function Tests: No results for input(s): TSH, T4TOTAL, FREET4, T3FREE, THYROIDAB in the last 72 hours. Anemia Panel: No results for input(s): VITAMINB12, FOLATE, FERRITIN, TIBC, IRON, RETICCTPCT in the last 72 hours. Urine analysis:  Component Value Date/Time   COLORURINE AMBER (A) 11/23/2017 1009   APPEARANCEUR CLEAR 11/23/2017 1009   LABSPEC 1.008 11/23/2017 1009   PHURINE 5.0 11/23/2017 1009   GLUCOSEU 50 (A) 11/23/2017 1009   GLUCOSEU NEGATIVE 07/24/2017 0937   HGBUR SMALL (A) 11/23/2017 1009   BILIRUBINUR MODERATE (A) 11/23/2017 1009   KETONESUR NEGATIVE 11/23/2017 1009   PROTEINUR NEGATIVE 11/23/2017 1009   UROBILINOGEN 0.2 07/24/2017 0937   NITRITE NEGATIVE 11/23/2017 1009   LEUKOCYTESUR NEGATIVE 11/23/2017 1009   Sepsis Labs: _0 (procalcitonin:4,lacticidven:4)  )No results found for this or any previous visit (from the past 240 hour(s)).     Radiology Studies: US Renal  Result Date: 11/26/2017 CLINICAL DATA:  Acute kidney injury chronic kidney disease EXAM: RENAL / URINARY TRACT ULTRASOUND COMPLETE COMPARISON:  CT 02/13/2017, MRI 11/24/2017 FINDINGS: Right Kidney: Length: 10.5 cm. Increased cortical echogenicity. No hydronephrosis. Cyst upper pole measuring 9 mm. Left Kidney: Length: 10.1 cm. Slightly echogenic. No hydronephrosis. Cyst in the midpole measuring 1.3 cm. Bladder: Appears normal for degree of bladder distention. Incidental note made of small amount of ascites in the right upper quadrant IMPRESSION: 1. Slight increased cortical echogenicity as may be seen with medical renal disease. No hydronephrosis 2. Small cysts in the kidneys 3. Trace ascites in the right upper quadrant Electronically Signed   By: Donavan Foil M.D.   On: 11/26/2017 19:21   Dg Chest Port 1 View  Result Date: 11/26/2017 CLINICAL DATA:  Shortness of breath EXAM: PORTABLE CHEST 1 VIEW COMPARISON:  02/21/2017 FINDINGS: Cardiomegaly with vascular congestion. Interstitial prominence throughout the lungs could reflect interstitial edema. Low lung volumes with bibasilar atelectasis and possible small effusions. IMPRESSION: Cardiomegaly with vascular congestion and probable mild interstitial edema. Bibasilar atelectasis and small effusions. Electronically Signed   By: Rolm Baptise M.D.   On: 11/26/2017 10:10   Dg Ercp Biliary & Pancreatic Ducts  Result Date: 11/26/2017 CLINICAL DATA:  ERCP with partial sphincterotomy. Known pancreatic mass. EXAM: ERCP TECHNIQUE: Multiple spot images obtained with the fluoroscopic device and submitted for interpretation post-procedure. COMPARISON:  MRCP - 11/24/2017 FLUOROSCOPY TIME:  3 minutes, 17 seconds FINDINGS: A single fluoroscopic sequence of the right upper abdominal quadrant during ERCP is provided for review Provided sequence demonstrates apparent selective cannulation and opacification of the presumed pancreatic duct  which appears moderate to markedly dilated as demonstrated on preceding MRCP. IMPRESSION: ERCP as above. These images were submitted for radiologic interpretation only. Please see the procedural report for the amount of contrast and the fluoroscopy time utilized. Electronically Signed   By: Sandi Mariscal M.D.   On: 11/26/2017 14:04     Scheduled Meds: . amLODipine  5 mg Oral Daily  . feeding supplement (ENSURE ENLIVE)  237 mL Oral BID BM  . gabapentin  300 mg Oral Daily  . heparin  5,000 Units Subcutaneous Q8H  . Influenza vac split quadrivalent PF  0.5 mL Intramuscular Tomorrow-1000  . insulin aspart  0-15 Units Subcutaneous TID WC  . insulin glargine  16 Units Subcutaneous Daily  . levothyroxine  137 mcg Oral QAC breakfast  . tamsulosin  0.4 mg Oral Daily   Continuous Infusions: . lactated ringers 50 mL/hr at 11/26/17 2136  . piperacillin-tazobactam (ZOSYN)  IV 2.25 g (11/27/17 0622)     LOS: 5 days   Time Spent in minutes  30 minutes  Robertine Kipper D.O. on 11/27/2017 at 1:10 PM  Between 7am to 7pm - Please see pager noted on [email or web address]  After 7pm go  to [email or web address]  And look for the night coverage person covering for me after hours  [facility] Office  [phone]

## 2017-11-27 NOTE — Progress Notes (Addendum)
Inpatient Diabetes Program Recommendations  AACE/ADA: New Consensus Statement on Inpatient Glycemic Control (2015)  Target Ranges:  Prepandial:   less than 140 mg/dL      Peak postprandial:   less than 180 mg/dL (1-2 hours)      Critically ill patients:  140 - 180 mg/dL   Results for KIPPER, BUCH (MRN [id number]) as of 11/27/2017 10:24  Ref. Range 11/25/2017 07:37 11/25/2017 12:12 11/25/2017 17:34 11/25/2017 21:52 11/25/2017 22:31 11/25/2017 23:37  Glucose-Capillary Latest Ref Range: 70 - 99 mg/dL 164 (H)  3 units NOVOLOG  152 (H)  3 units NOVOLOG +  16 units LANTUS 174 (H)  3 units NOVOLOG 42 (LL) 64 (L) 150 (H)   Results for MATIN, MATTIOLI (MRN [id number]) as of 11/27/2017 10:24  Ref. Range 11/26/2017 07:20 11/26/2017 16:30 11/26/2017 21:31  Glucose-Capillary Latest Ref Range: 70 - 99 mg/dL 143 (H)  2 units NOVOLOG +  16 units LANTUS 113 (H)   137 (H)   Results for RUSTON, FEDORA (MRN [id number]) as of 11/27/2017 10:24  Ref. Range 11/27/2017 07:31  Glucose-Capillary Latest Ref Range: 70 - 99 mg/dL 33 (LL)    Home DM Meds: Amaryl 4 mg Daily       Lantus (Toujeo) 20 units QPM       Metformin 1000 mg QPM       Victoza 1.2 mg Daily  Current Orders: Lantus 16 units Daily      Novolog Moderate Correction Scale/ SSI (0-15 units) TID AC       MD- Patient with Severe Hypoglycemia this AM.  Please consider reducing Lantus to 10 units Daily (please make sure patient receives dose today)  May also consider reducing Novolog SSI to Sensitive scale (0-9 units) TID AC      --Will follow patient during hospitalization--  Wyn Quaker RN, MSN, CDE Diabetes Coordinator Inpatient Glycemic Control Team Team Pager: [phone] (8a-5p)

## 2017-11-27 NOTE — Progress Notes (Signed)
Chaplain received a page from the Nursing Unit to have HCPOA completed. Upon arrival to the patient's room it is noted that the wife of the patient had taken it home with her. Chaplain informed the Nursing Desk of the same, and to call when the [facility] document  is present with the patient, and ready to be notarized. Ovidio Hanger [phone]

## 2017-11-27 NOTE — Progress Notes (Signed)
CRITICAL VALUE ALERT  Critical Value:  Bilirubin 33.1  Date & Time Notied: 11/27/17 0549  Provider Notified: Raliegh Ip Schorr  Orders Received/Actions taken: No orders received.

## 2017-11-27 NOTE — Progress Notes (Addendum)
   Patient Name: Zachary Cook Date of Encounter: 11/27/2017, 12:50 PM    Subjective  Still Jaundiced   Objective  BP (!) 91/55 (BP Location: Left Arm)   Pulse (!) 59   Temp 97.6 F (36.4 C) (Oral)   Resp 18   Ht 5\' 4"  (1.626 m)   Wt 73.4 kg   SpO2 97%   BMI 27.78 kg/m  Awake jaundiced NAD   Lab Results  Component Value Date   ALT 226 (H) 11/27/2017   AST 143 (H) 11/27/2017   ALKPHOS 1,434 (H) 11/27/2017   BILITOT 33.1 (HH) 11/27/2017   Lab Results  Component Value Date   CREATININE 3.86 (H) 11/27/2017   BUN 51 (H) 11/27/2017   NA 138 11/27/2017   K 3.6 11/27/2017   CL 106 11/27/2017   CO2 20 (L) 11/27/2017   Lab Results  Component Value Date   WBC 5.1 11/27/2017   HGB 9.5 (L) 11/27/2017   HCT 29.7 (L) 11/27/2017   MCV 84.6 11/27/2017   PLT 140 (L) 11/27/2017      Assessment and Plan  Obstructive jaundice w/ pancreatic head mass, markedly elevated CA19-9 - think panceatic cancer Unsuccessful biliary access yesterday  Will have Dr. Rush Landmark attempt Mon 9/23  We will f/u 9/22 - sooner if needed (please call)  I believe hospice makes sense - wife not present to discuss  Gatha Mayer, MD, [facility] 11/27/2017 12:50 PM [phone]

## 2017-11-27 NOTE — Consult Note (Signed)
Consultation Note Date: 11/27/2017   Patient Name: [facility]  DOB: [birth year]  MRN: [id number]  Age / Sex: 84 y.o., male  PCP: [name], MD Referring Physician: Cristal Ford, DO  Reason for Consultation: Establishing goals of care and Psychosocial/spiritual support  HPI/Patient Profile: 84 y.o. male   admitted on 11/22/2017 with past  medical history significant for hypothyroid, HTN, GI bleed (diverticulosis, s/p partial colectomy), incisional hernia repair 07/2017, T2DM, who has continued to decline physically, functionally and cognitively over the last several months.  Patient has had a 50 pound weight loss according to his wife.  He has had significant besides of nausea and vomiting  Today he is significantly jaundiced wife tells me it  began about a week ago.   Per oncology note patient with severe obstructive jaundice with MRCP suggesting a mass in the head of the pancreas and grossly elevated CA 19-19 markers suggestive of pancreatic adenocarcinoma.  Patient is confused currently and unable to make medical decisions for himself.  His wife faces treatment option decisions, advanced directive decisions and anticipatory care needs.  Clinical Assessment and Goals of Care:  This NP Wadie Lessen reviewed medical records, received report from team, assessed the patient and then meet at the patient's bedside along with his wife  to discuss diagnosis, prognosis, GOC, EOL wishes disposition and options.  Concept of Hospice and Palliative Care were discussed  A detailed discussion was had today regarding advanced directives.  Concepts specific to code status, artifical feeding and hydration, continued IV antibiotics and rehospitalization was had.  The difference between a aggressive medical intervention path  and a palliative comfort care path for this patient at this time was had.  Values and  goals of care important to patient and family were attempted to be elicited.  Wife verbalizes that she understands the seriousness of the situation.  Her main priority is that her husband does not suffer.  However at this time she wishes to continue with the current medical interventions including planned second attempt at biliary access.  She hopes that a few more days will give her time to process the current situation and enable her to make more definitive care plans.  We discussed the difference between home with hospices and a residential hospice.  I shared with the wife my belief that if the decision was to shift to a full comfort path with no further life prolonging measures that the patient would be eligible for residential hospice.  Natural trajectory and expectations at EOL were discussed.  Questions and concerns addressed.   Wife  encouraged to call with questions or concerns.    PMT will continue to support holistically.   NEXT OF KIN    SUMMARY OF RECOMMENDATIONS    Code Status/Advance Care Planning:  DNR   Symptom Management:   Pain: Hydrocodone-acetaminophen 5-325 mg 1 tablet p.o. every 6 hours as needed for pain--- this was a home medication  Palliative Prophylaxis:   Aspiration, Bowel Regimen, Frequent Pain Assessment and Oral Care  Psycho-social/Spiritual:   Desire for further Chaplaincy support:no  Additional Recommendations: Education on Hospice and Grief/Bereavement Support  Prognosis:   < 3 months  Discharge Planning: To Be Determined      Primary Diagnoses: Present on Admission: . Hypertension . AKI (acute kidney injury) ([place])   I have reviewed the medical record, interviewed the patient and family, and examined the patient. The following aspects are pertinent.  Past Medical History:  Diagnosis Date  . Barrett's esophagus   . Bilateral inguinal hernia   . Cardiomegaly 02/21/2017   noted on CXR  . Cholelithiasis   . Chronic kidney  disease    stage 3 Goes to [place] [facility]  . Diabetes mellitus without complication ([place])    type 2  . Diverticulosis   . Duodenitis   . GERD (gastroesophageal reflux disease)   . Hiatal hernia   . History of blood transfusion 05/2016  . History of GI bleed   . History of hyperkalemia 02/2017  . History of kidney stones    35 years ago  . Hyperlipemia   . Hypertension   . Hypogonadism in male   . Hypothyroidism   . Ileus following gastrointestinal surgery ([place]) 06/12/2016  . Skin cancer    right arm treated by dermatologist  . Syncope 06/03/2016  . Umbilical hernia    Small  . Ventral hernia    Anterior   Social History   Socioeconomic History  . Marital status: Married    Spouse name: Not on file  . Number of children: Not on file  . Years of education: Not on file  . Highest education level: Not on file  Occupational History  . Not on file  Social Needs  . Financial resource strain: Not on file  . Food insecurity:    Worry: Not on file    Inability: Not on file  . Transportation needs:    Medical: Not on file    Non-medical: Not on file  Tobacco Use  . Smoking status: Never Smoker  . Smokeless tobacco: Never Used  Substance and Sexual Activity  . Alcohol use: No    Alcohol/week: 0.0 standard drinks  . Drug use: No  . Sexual activity: Not Currently  Lifestyle  . Physical activity:    Days per week: Not on file    Minutes per session: Not on file  . Stress: Not on file  Relationships  . Social connections:    Talks on phone: Not on file    Gets together: Not on file    Attends religious service: Not on file    Active member of club or organization: Not on file    Attends meetings of clubs or organizations: Not on file    Relationship status: Not on file  Other Topics Concern  . Not on file  Social History Narrative  . Not on file   Family History  Problem Relation Age of Onset  . Heart disease Mother   . Diabetes Mother   . Cancer Father   .  Heart attack Son    Scheduled Meds: . amLODipine  5 mg Oral Daily  . feeding supplement (ENSURE ENLIVE)  237 mL Oral BID BM  . gabapentin  300 mg Oral Daily  . heparin  5,000 Units Subcutaneous Q8H  . Influenza vac split quadrivalent PF  0.5 mL Intramuscular Tomorrow-1000  . insulin aspart  0-15 Units Subcutaneous TID WC  . insulin glargine  16 Units Subcutaneous Daily  . levothyroxine  137 mcg Oral QAC breakfast  . tamsulosin  0.4 mg Oral Daily   Continuous Infusions: . lactated ringers 50 mL/hr at 11/26/17 2136  . piperacillin-tazobactam (ZOSYN)  IV 2.25 g (11/27/17 0622)   PRN Meds:.guaiFENesin-dextromethorphan, hydrOXYzine, ondansetron **OR** ondansetron (ZOFRAN) IV Medications Prior to Admission:  Prior to Admission medications   Medication Sig Start Date End Date Taking? Authorizing Provider  ACCU-CHEK SMARTVIEW test strip USE TO TEST TWICE DAILY Patient taking differently: 1 each by Other route 2 (two) times daily.  04/07/17  Yes Elayne Snare, MD  amLODipine (NORVASC) 5 MG tablet TAKE 1 TABLET(5 MG) BY MOUTH DAILY Patient taking differently: Take 5 mg by mouth daily.  07/14/17  Yes [name], MD  aspirin EC 81 MG tablet Take 81 mg by mouth daily.   Yes [provider]  atorvastatin (LIPITOR) 10 MG tablet TAKE 1 TABLET(10 MG) BY MOUTH DAILY AT 6 PM Patient taking differently: Take 10 mg by mouth daily at 6 PM.  07/07/17  Yes [name], MD  B-D UF III MINI PEN NEEDLES 31G X 5 MM MISC USE ONE PER DAY WITH VICTOZA Patient taking differently: Inject 1 each into the skin daily.  09/25/17  Yes Elayne Snare, MD  esomeprazole (NEXIUM) 40 MG capsule TAKE ONE CAPSULE BY MOUTH DAILY AT NOON Patient taking differently: Take 40 mg by mouth daily at 12 noon.  08/12/17  Yes [name], MD  glimepiride (AMARYL) 2 MG tablet TAKE 2 TABLETS(4 MG) BY MOUTH DAILY BEFORE DINNER Patient taking differently: Take 4 mg by mouth daily with breakfast.  08/20/17  Yes Elayne Snare, MD    Insulin Glargine (TOUJEO SOLOSTAR) 300 UNIT/ML SOPN Inject 16 Units into the skin daily. Patient taking differently: Inject 20 Units into the skin every evening.  07/07/17  Yes Elayne Snare, MD  levothyroxine (SYNTHROID, LEVOTHROID) 137 MCG tablet TAKE 1 TABLET(137 MCG) BY MOUTH DAILY Patient taking differently: Take 137 mcg by mouth daily before breakfast.  08/14/17  Yes Elayne Snare, MD  metFORMIN (GLUCOPHAGE-XR) 500 MG 24 hr tablet Take 2 tablets (1,000 mg total) by mouth daily with supper. 05/09/17  Yes Elayne Snare, MD  tamsulosin (FLOMAX) 0.4 MG CAPS capsule TAKE 1 CAPSULE BY MOUTH DAILY Patient taking differently: Take 0.4 mg by mouth daily.  09/10/17  Yes [name], MD  VICTOZA 18 MG/3ML SOPN ADMINISTER 1.2 MG UNDER THE SKIN DAILY Patient taking differently: Inject 1.2 mg into the skin daily. ADMINISTER 1.2 MG UNDER THE SKIN DAILY 11/13/17  Yes Elayne Snare, MD  gabapentin (NEURONTIN) 300 MG capsule Take 1 capsule (300 mg total) by mouth 2 (two) times daily. Patient not taking: Reported on 11/22/2017 08/03/17   Alphonsa Overall, MD  HYDROcodone-acetaminophen (NORCO/VICODIN) 5-325 MG tablet Take 1 tablet by mouth every 6 (six) hours as needed for moderate pain. Patient not taking: Reported on 11/22/2017 08/03/17   Alphonsa Overall, MD   No Known Allergies Review of Systems  Unable to perform ROS: Acuity of condition    Physical Exam  Constitutional: He appears lethargic. He appears cachectic. He appears ill.  Cardiovascular: Normal rate, regular rhythm and normal heart sounds.  Pulmonary/Chest: He has decreased breath sounds.  Neurological: He appears lethargic.  Skin: Skin is warm and dry.  - gross jaundice    Vital Signs: BP (!) 91/55 (BP Location: Left Arm)   Pulse (!) 59   Temp 97.6 F (36.4 C) (Oral)   Resp 18   Ht 5\' 4"  (1.626 m)  Wt 73.4 kg   SpO2 97%   BMI 27.78 kg/m  Pain Scale: 0-10   Pain Score: 0-No pain   SpO2: SpO2: 97 % O2 Device:SpO2: 97 % O2 Flow Rate: .    IO: Intake/output summary:   Intake/Output Summary (Last 24 hours) at 11/27/2017 1112 Last data filed at 11/27/2017 0200 Gross per 24 hour  Intake 3659.79 ml  Output -  Net 3659.79 ml    LBM: Last BM Date: 11/25/17 Baseline Weight: Weight: 66.7 kg Most recent weight: Weight: 73.4 kg     Palliative Assessment/Data: 30 % at best    Discussed with Dr Ree Kida  Time In: 1200 Time Out: 1315 Time Total: 75 minutes Greater than 50%  of this time was spent counseling and coordinating care related to the above assessment and plan.  Signed by: Wadie Lessen, NP   Please contact Palliative Medicine Team phone at [phone] for questions and concerns.  For individual provider: See Shea Evans

## 2017-11-27 NOTE — Progress Notes (Signed)
Palliative Medicine RN Note: Rec'd call from pt's wife requesting notary to sign POA forms. I spoke with Wadie Lessen, who met with them earlier today. She reports that family wants notary for FINANCIAL forms, with which we cannot assist. Also, she felt pt lacked capacity to complete any forms. I called Mrs Hamblin back twice; no answer first time. Left message on second attempt explaining that he did not have capacity to complete forms and that even if he did we can only help with MEDICAL poa forms. I suggested that if patient is completely awake and oriented tomorrow, wife can ask RN to call for a chaplain then.  Marjie Skiff Karmina Zufall, RN, BSN, [facility] Palliative Medicine Team 11/27/2017 12:59 PM Office [phone]

## 2017-11-27 NOTE — Progress Notes (Signed)
CBG IS 33. Dr Ree Kida notified/ D50 1 AMP given IV. Pt is alert . And confused

## 2017-11-28 LAB — GLUCOSE, CAPILLARY
GLUCOSE-CAPILLARY: 126 mg/dL — AB (ref 70–99)
GLUCOSE-CAPILLARY: 87 mg/dL (ref 70–99)
Glucose-Capillary: 134 mg/dL — ABNORMAL HIGH (ref 70–99)
Glucose-Capillary: 30 mg/dL — CL (ref 70–99)
Glucose-Capillary: 120 mg/dL — ABNORMAL HIGH (ref 70–99)
Glucose-Capillary: 37 mg/dL — CL (ref 70–99)

## 2017-11-28 LAB — COMPREHENSIVE METABOLIC PANEL
ALK PHOS: 1362 U/L — AB (ref 38–126)
ALT: 195 U/L — AB (ref 0–44)
AST: 134 U/L — AB (ref 15–41)
Albumin: 1.9 g/dL — ABNORMAL LOW (ref 3.5–5.0)
Anion gap: 14 (ref 5–15)
BILIRUBIN TOTAL: 31.7 mg/dL — AB (ref 0.3–1.2)
BUN: 55 mg/dL — AB (ref 8–23)
CALCIUM: 8.8 mg/dL — AB (ref 8.9–10.3)
CHLORIDE: 103 mmol/L (ref 98–111)
CO2: 19 mmol/L — ABNORMAL LOW (ref 22–32)
CREATININE: 4.42 mg/dL — AB (ref 0.61–1.24)
GFR, EST AFRICAN AMERICAN: 13 mL/min — AB (ref >60)
GFR, EST NON AFRICAN AMERICAN: 11 mL/min — AB (ref >60)
Glucose, Bld: 106 mg/dL — ABNORMAL HIGH (ref 70–99)
Potassium: 4.3 mmol/L (ref 3.5–5.1)
Sodium: 136 mmol/L (ref 135–145)
Total Protein: 5.5 g/dL — ABNORMAL LOW (ref 6.5–8.1)

## 2017-11-28 LAB — CBC
HCT: 30.8 % — ABNORMAL LOW (ref 39.0–52.0)
Hemoglobin: 9.8 g/dL — ABNORMAL LOW (ref 13.0–17.0)
MCH: 26.9 pg (ref 26.0–34.0)
MCHC: 31.8 g/dL (ref 30.0–36.0)
MCV: 84.6 fL (ref 78.0–100.0)
PLATELETS: 170 10*3/uL (ref 150–400)
RBC: 3.64 MIL/uL — AB (ref 4.22–5.81)
RDW: 19.3 % — AB (ref 11.5–15.5)
WBC: 8.8 10*3/uL (ref 4.0–10.5)

## 2017-11-28 LAB — MAGNESIUM: MAGNESIUM: 1.9 mg/dL (ref 1.7–2.4)

## 2017-11-28 MED ORDER — LORAZEPAM 2 MG/ML IJ SOLN
1.0000 mg | Freq: Once | INTRAMUSCULAR | Status: AC
  Administered 2017-11-28: 1 mg via INTRAVENOUS
  Filled 2017-11-28: qty 1

## 2017-11-28 MED ORDER — INSULIN GLARGINE 100 UNIT/ML ~~LOC~~ SOLN
10.0000 [IU] | Freq: Every day | SUBCUTANEOUS | Status: DC
  Administered 2017-11-28 – 2017-11-30 (×3): 10 [IU] via SUBCUTANEOUS
  Filled 2017-11-28 (×4): qty 0.1

## 2017-11-28 MED ORDER — DEXTROSE 50 % IV SOLN
INTRAVENOUS | Status: AC
  Administered 2017-11-28: 50 mL via INTRAVENOUS
  Filled 2017-11-28: qty 50

## 2017-11-28 MED ORDER — INSULIN ASPART 100 UNIT/ML ~~LOC~~ SOLN
0.0000 [IU] | Freq: Three times a day (TID) | SUBCUTANEOUS | Status: DC
  Administered 2017-11-29: 1 [IU] via SUBCUTANEOUS

## 2017-11-28 NOTE — Progress Notes (Signed)
[place] [facility]ual [facility] Note    11/28/17 1600  Clinical Encounter Type  Visited With Patient not available  Visit Type  (Advance Directive referral)  Referral From Chaplain Luellen Pucker Thornton/MDiv)   Attempted f/u re Advance Directive per referral from [facility] Thornton/MDiv, but pt sleeping and no family present. Will refer to day chaplains to f/u next week.   [place], [place], [facility] M-F daytime pager [phone] [facility] 24/7 pager [phone] Voicemail [phone]

## 2017-11-28 NOTE — Progress Notes (Signed)
Bedside report given to Kim, RN.

## 2017-11-28 NOTE — Progress Notes (Signed)
PROGRESS NOTE    Zachary Cook  WTU:[id number] DOB: [birth year] DOA: 11/22/2017 PCP: [name], MD   Brief Narrative:  HPI On 11/22/2017 by Dr. Laurey Arrow Zachary Cook is a 84 y.o. male with medical history significant for hypothyroid, htn, gi bleed (diverticulosis, s/p partial colectomy), incisional hernia repair 07/2017, T2DM, who presents with above.  Symptoms began 5 wks ago and have progressively worsened. Began feeling fatigued, then at times dizzy when ambulating. Then developed nausea, worse with eating, with vomiting after most meals. PO intake decreased, says has lost 30-40 pounds. Began to develop jaundice about a week ago. Says was evaluated by PCP early in this course and says he thinks pcp wasn't much concerned, sounds like symptoms mild at that time. Also had an episode of white/tan stools that have resolved. No fevers. No abdominal pain. No diarrhea, no blood in stool. No new meds. Not a drinker. No history liver or gallbladder problems. No history cancer.  Interim history  Presented with jaundice, nausea and vomiting.  Admitted and found to have hyperbilirubinemia along with elevated LFTs.  GI consulted, MRCP done.  S/p unsuccessful ERCP on 9/18. Pending further GI recommendations.  Assessment & Plan   Severe painless jaundice/hyperbilirubinemia/elevated LFTs secondary to pancreatitic head mass -Abdominal ultrasound showed contracted gallbladder with moderate cholelithiasis.  Mild dilatation of common bile duct.  Mild hepatic steatosis. -LFTs as well as bilirubin and alk phos all elevated upon admission -Gastroenterology consulted and appreciated recommended MRCP which showed no prominent dilatation of the biliary tree and of the dorsal pancreatic duct, findings concerning for possibility of adenocarcinoma pancreatic head.  Multiple gallstones. -Status post unsuccessful ERCP on 11/26/17 -GI planning for another ERCP attempt on 12/01/2017 -Continue IV Zosyn as  well as antiemetics -Oncology consulted and appreciated  Acute kidney injury on chronic kidney disease, stage III with metabolic acidosis -Likely secondary to poor oral intake -Creatinine baseline approximately 1.5-1.6, on admission 2.73, currently up to 4.42 -Continue IV fluids and monitor BMP -Renal US: Medical renal disease, no hydronephrosis -Avoid nephrotoxic agents  -of note, patient was given IV lasix and indomethacin on 11/25/2017 -Nephrology consulted and appreciated -BP mildly low  Diabetes mellitus, type II complicated by hypoglycemia -Metformin, Victoza, glipizide held -Decrease lantus to 10u, and changed ISS to sensitive  -Continue CBG monitoring   Essential hypertension -continue to hold amlodipine   Hyperlipidemia -Statin held due to elevated LFTs  BPH -Continue tamsulosin  Hypothyroidism -Continue Synthroid, TSH checked 0.949  Peripheral neuropathy -Continue gabapentin  Hypomagnesemia -Resolved with replacement  Hypokalemia -Resolved with replacement -Continue to monitor BMP  Chronic normocytic Anemia -hemoglobin currently 9.8 -baseline hemoglobin around 11-12, however April 2019, Hemoglobin was around 8-9  Fall -Unwitnessed in the bathroom -CT head unremarkable for acute intracranial abnormalities  DVT Prophylaxis  Heparin  Code Status: DNR  Family Communication: Wife at bedside  Disposition Plan: Admitted. Pending further recommendations from palliative care,  Oncology, GI- repeat ERCP on 9/23  Consultants Gastroenterology Oncology Palliative Care  Procedures  MRCP ERCP  Antibiotics   Anti-infectives (From admission, onward)   Start     Dose/Rate Route Frequency Ordered Stop   11/24/17 1400  piperacillin-tazobactam (ZOSYN) IVPB 2.25 g     2.25 g 100 mL/hr over 30 Minutes Intravenous Every 8 hours 11/24/17 1043        Subjective:   Zachary Cook seen and examined today.  Patient has no complaints today. Denies current chest  pain, shortness of breath, abdominal pain, N/V, dizziness, headache.  Objective:   Vitals:   11/26/17 2130 11/27/17 0642 11/27/17 2136 11/28/17 0410  BP: (!) 99/55 (!) 91/55 100/60 113/60  Pulse: 79 (!) 59 62 65  Resp: _0 Temp:  97.6 F (36.4 C) 97.9 F (36.6 C) 98 F (36.7 C)  TempSrc:  Oral Oral Oral  SpO2: 97% 97% 98% 97%  Weight:      Height:        Intake/Output Summary (Last 24 hours) at 11/28/2017 1112 Last data filed at 11/28/2017 0700 Gross per 24 hour  Intake 750 ml  Output 400 ml  Net 350 ml   Filed Weights   11/22/17 1859 11/25/17 1125  Weight: 66.7 kg 73.4 kg   Exam  General: Well developed, well nourished, NAD, appears stated age  47: NCAT, mucous membranes moist.   Neck: Supple  Cardiovascular: S1 S2 auscultated, RRR  Respiratory: Clear to auscultation bilaterally with equal chest rise  Abdomen: Soft, nontender, nondistended, + bowel sounds  Extremities: warm dry without cyanosis clubbing or edema  Neuro: AAOx2, nonfocal  Psych: pleasant  Data Reviewed: I have personally reviewed following labs and imaging studies  CBC: Recent Labs  Lab 11/22/17 1908  11/24/17 0822 11/25/17 0526 11/26/17 0330 11/27/17 0414 11/28/17 0847  WBC 7.0   < > 6.3 7.0 7.1 5.1 8.8  NEUTROABS 5.2  --  4.1 4.7 5.2  --   --   HGB 11.9*   < > 10.7* 10.1* 8.9* 9.5* 9.8*  HCT 36.1*   < > 33.1* 31.0* 27.3* 29.7* 30.8*  MCV 85.1   < > 84.4 83.3 84.3 84.6 84.6  PLT 233   < > 203 205 175 140* 170   < > = values in this interval not displayed.   Basic Metabolic Panel: Recent Labs  Lab 11/24/17 0822 11/25/17 0526 11/26/17 0330 11/27/17 0414 11/28/17 0847  NA 132* 133* 136 138 136  K 3.4* 3.6 4.1 3.6 4.3  CL 106 103 103 106 103  CO2 16* 18* 21* 20* 19*  GLUCOSE 105* 196* 194* 66* 106*  BUN 48* 44* 48* 51* 55*  CREATININE 2.91* 2.98* 3.37* 3.86* 4.42*  CALCIUM 8.7* 8.8* 8.5* 8.6* 8.8*  MG 1.4* 2.0 1.9  --  1.9  PHOS 3.3 3.0 3.4  --   --     GFR: Estimated Creatinine Clearance: 11.6 mL/min (A) (by C-G formula based on SCr of 4.42 mg/dL (H)). Liver Function Tests: Recent Labs  Lab 11/24/17 0822 11/25/17 0526 11/26/17 0330 11/27/17 0414 11/28/17 0847  AST 474* 419* 241* 143* 134*  ALT 424* 377* 282* 226* 195*  ALKPHOS 2,089* 2,000* 1,546* 1,434* 1,362*  BILITOT 28.7* 28.9* 28.3* 33.1* 31.7*  PROT 5.8* 5.5* 5.1* 5.2* 5.5*  ALBUMIN 2.0* 1.9* 1.8* 1.9* 1.9*   Recent Labs  Lab 11/22/17 1908 11/25/17 0526  LIPASE 67* 51   No results for input(s): AMMONIA in the last 168 hours. Coagulation Profile: Recent Labs  Lab 11/22/17 1908  INR 1.38   Cardiac Enzymes: No results for input(s): CKTOTAL, CKMB, CKMBINDEX, TROPONINI in the last 168 hours. BNP (last 3 results) No results for input(s): PROBNP in the last 8760 hours. HbA1C: No results for input(s): HGBA1C in the last 72 hours. CBG: Recent Labs  Lab 11/27/17 1215 11/27/17 1721 11/27/17 2137 11/28/17 0753 11/28/17 0812  GLUCAP 126* 187* 101* 30* 134*   Lipid Profile: No results for input(s): CHOL, HDL, LDLCALC, TRIG, CHOLHDL, LDLDIRECT in the last 72 hours. Thyroid Function Tests:  No results for input(s): TSH, T4TOTAL, FREET4, T3FREE, THYROIDAB in the last 72 hours. Anemia Panel: No results for input(s): VITAMINB12, FOLATE, FERRITIN, TIBC, IRON, RETICCTPCT in the last 72 hours. Urine analysis:    Component Value Date/Time   COLORURINE AMBER (A) 11/23/2017 1009   APPEARANCEUR CLEAR 11/23/2017 1009   LABSPEC 1.008 11/23/2017 1009   PHURINE 5.0 11/23/2017 1009   GLUCOSEU 50 (A) 11/23/2017 1009   GLUCOSEU NEGATIVE 07/24/2017 0937   HGBUR SMALL (A) 11/23/2017 1009   BILIRUBINUR MODERATE (A) 11/23/2017 1009   KETONESUR NEGATIVE 11/23/2017 1009   PROTEINUR NEGATIVE 11/23/2017 1009   UROBILINOGEN 0.2 07/24/2017 0937   NITRITE NEGATIVE 11/23/2017 1009   LEUKOCYTESUR NEGATIVE 11/23/2017 1009   Sepsis  Labs: _0 (procalcitonin:4,lacticidven:4)  )No results found for this or any previous visit (from the past 240 hour(s)).    Radiology Studies: US Renal  Result Date: 11/26/2017 CLINICAL DATA:  Acute kidney injury chronic kidney disease EXAM: RENAL / URINARY TRACT ULTRASOUND COMPLETE COMPARISON:  CT 02/13/2017, MRI 11/24/2017 FINDINGS: Right Kidney: Length: 10.5 cm. Increased cortical echogenicity. No hydronephrosis. Cyst upper pole measuring 9 mm. Left Kidney: Length: 10.1 cm. Slightly echogenic. No hydronephrosis. Cyst in the midpole measuring 1.3 cm. Bladder: Appears normal for degree of bladder distention. Incidental note made of small amount of ascites in the right upper quadrant IMPRESSION: 1. Slight increased cortical echogenicity as may be seen with medical renal disease. No hydronephrosis 2. Small cysts in the kidneys 3. Trace ascites in the right upper quadrant Electronically Signed   By: Donavan Foil M.D.   On: 11/26/2017 19:21   Dg Ercp Biliary & Pancreatic Ducts  Result Date: 11/26/2017 CLINICAL DATA:  ERCP with partial sphincterotomy. Known pancreatic mass. EXAM: ERCP TECHNIQUE: Multiple spot images obtained with the fluoroscopic device and submitted for interpretation post-procedure. COMPARISON:  MRCP - 11/24/2017 FLUOROSCOPY TIME:  3 minutes, 17 seconds FINDINGS: A single fluoroscopic sequence of the right upper abdominal quadrant during ERCP is provided for review Provided sequence demonstrates apparent selective cannulation and opacification of the presumed pancreatic duct which appears moderate to markedly dilated as demonstrated on preceding MRCP. IMPRESSION: ERCP as above. These images were submitted for radiologic interpretation only. Please see the procedural report for the amount of contrast and the fluoroscopy time utilized. Electronically Signed   By: Sandi Mariscal M.D.   On: 11/26/2017 14:04     Scheduled Meds: . feeding supplement (ENSURE ENLIVE)  237 mL Oral BID BM   . heparin  5,000 Units Subcutaneous Q8H  . Influenza vac split quadrivalent PF  0.5 mL Intramuscular Tomorrow-1000  . insulin aspart  0-15 Units Subcutaneous TID WC  . insulin glargine  16 Units Subcutaneous Daily  . levothyroxine  137 mcg Oral QAC breakfast  . tamsulosin  0.4 mg Oral Daily   Continuous Infusions: . lactated ringers 50 mL/hr at 11/28/17 0051  . piperacillin-tazobactam (ZOSYN)  IV Stopped (11/28/17 0655)     LOS: 6 days   Time Spent in minutes  30 minutes  Monia Timmers D.O. on 11/28/2017 at 11:12 AM  Between 7am to 7pm - Please see pager noted on [email or web address]  After 7pm go to [email or web address]  And look for the night coverage person covering for me after hours  [facility] Office  [phone]

## 2017-11-28 NOTE — Progress Notes (Signed)
Report given by Fabio Pierce, RN. Pt attempting to pull at lines. On call doctor paged. Telesitter and wife at bedside. Bed in low position with mats on the floor. IVF infusing per order.

## 2017-11-28 NOTE — Progress Notes (Signed)
Inpatient Diabetes Program Recommendations  AACE/ADA: New Consensus Statement on Inpatient Glycemic Control (2015)  Target Ranges:  Prepandial:   less than 140 mg/dL      Peak postprandial:   less than 180 mg/dL (1-2 hours)      Critically ill patients:  140 - 180 mg/dL   Results for PALMER, FAHRNER (MRN [id number]) as of 11/28/2017 10:53  Ref. Range 11/27/2017 07:31 11/27/2017 08:30 11/27/2017 12:15 11/27/2017 17:21 11/27/2017 21:37  Glucose-Capillary Latest Ref Range: 70 - 99 mg/dL 33 (LL) 111 (H) 126 (H) 187 (H) 101 (H)   Results for BRILEY, SULTON (MRN [id number]) as of 11/28/2017 10:53  Ref. Range 11/28/2017 07:53 11/28/2017 08:12  Glucose-Capillary Latest Ref Range: 70 - 99 mg/dL 30 (LL) 134 (H)    Home DM Meds: Amaryl 4 mg Daily                             Lantus (Toujeo) 20 units QPM                             Metformin 1000 mg QPM                             Victoza 1.2 mg Daily  Current Orders: Lantus 16 units Daily                            Novolog Moderate Correction Scale/ SSI (0-15 units) TID AC        MD- Patient with Severe Hypoglycemia yesterday AM and again this AM.  Please consider reducing Lantus to 10 units Daily (please make sure patient receives dose today)  May also consider reducing Novolog SSI to Sensitive scale (0-9 units) TID AC     --Will follow patient during hospitalization--  Wyn Quaker RN, MSN, CDE Diabetes Coordinator Inpatient Glycemic Control Team Team Pager: [phone] (8a-5p)

## 2017-11-28 NOTE — Progress Notes (Signed)
Hypoglycemic Event  CBG: 30  Treatment: D50 IV 50 mL  Symptoms: Pale  Follow-up CBG: [id number] CBG Result:134  Possible Reasons for Event: Inadequate meal intake  Comments/MD notified:Hypoglycemic protocol initiated    Wilkie Aye Dionne

## 2017-11-28 NOTE — Progress Notes (Signed)
PMT progress note  84 yo gentleman with life limiting illness of pancreatic head mass, admitted with severe painless jaundice, elevated LFTs, hyper bilirubinemia. Hospital course also complicated by AKI, also has III CKD, DM, HTN, HLD. Patient with weight loss, nausea vomiting.   PMT has been consulted for facilitating goals of care discussions, following hospital course and helping address conversations.   There is to be a second attempt for biliary stent placement on 12-01-17.   PMT continues to follow.   Wife not in room, patient appears icteric. He    BP 113/60 (BP Location: Right Arm)   Pulse 65   Temp 98 F (36.7 C) (Oral)   Resp 14   Ht 5\' 4"  (1.626 m)   Wt 73.4 kg   SpO2 97%   BMI 27.78 kg/m    Labs and imaging noted.   Patient is icteric, appears with generalized weakness.  S 1 S2 Regular breath sounds Abdomen is not distended No edema  He answers a few questions appropriately.   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  AKI III CKD Biliary obstruction.   Chart reviewed, discussed with TRH MD.  PMT to continue to follow, patient appears to have a high risk for ongoing decline/decompensation.  15 minutes spent.   Loistine Chance MD [facility] health palliative medicine team [phone]

## 2017-11-28 NOTE — Progress Notes (Signed)
CRITICAL VALUE ALERT  Critical Value:  Bili 31.7  Date & Time Notied:  11/28/17 & 1010  Provider Notified: Dr. Ree Kida  Orders Received/Actions taken: None needed. Results similar to previous noted values.

## 2017-11-28 NOTE — Progress Notes (Signed)
[facility] Progress Note  Subjective: UOP 400 cc  Yest, creat up today to 4.42, K + is ok.    Vitals:   11/26/17 2130 11/27/17 0642 11/27/17 2136 11/28/17 0410  BP: (!) 99/55 (!) 91/55 100/60 113/60  Pulse: 79 (!) 59 62 65  Resp: 15 18 16 14   Temp:  97.6 F (36.4 C) 97.9 F (36.6 C) 98 F (36.7 C)  TempSrc:  Oral Oral Oral  SpO2: 97% 97% 98% 97%  Weight:      Height:        Inpatient medications: . feeding supplement (ENSURE ENLIVE)  237 mL Oral BID BM  . heparin  5,000 Units Subcutaneous Q8H  . Influenza vac split quadrivalent PF  0.5 mL Intramuscular Tomorrow-1000  . insulin aspart  0-9 Units Subcutaneous TID WC  . insulin glargine  10 Units Subcutaneous Daily  . levothyroxine  137 mcg Oral QAC breakfast  . tamsulosin  0.4 mg Oral Daily   . lactated ringers 50 mL/hr at 11/28/17 0051  . piperacillin-tazobactam (ZOSYN)  IV Stopped (11/28/17 0655)   guaiFENesin-dextromethorphan, HYDROcodone-acetaminophen, hydrOXYzine, ondansetron **OR** ondansetron (ZOFRAN) IV  Iron/TIBC/Ferritin/ %Sat No results found for: IRON, TIBC, FERRITIN, IRONPCTSAT  Home meds:  - amlodipine 5 qd  - victoza 1.2mg  qd/ metformin 1gm qd/ insulin glargine 16u/ glimepiride 4mg  qd  - aspirin 81/ atorvastatin 10 qd/ esomeprazole 40 qd/ T4 137ug/ tamsulosin 0.4  - gabapentin 300 bid/ hydrocodone-acetaminophen 5-325 q 6 prn   Exam: Gen alert, marked jaundice, no distress, HOH  No jvd or bruits Chest clear bilat to bases RRR no MRG Abd soft ntnd no mass or ascites +bs, protuberant GU normal male MS no joint effusions or deformity Ext no LE or UE edema Neuro is alert, Ox 3 , nf, +myoclonic jerking    IP meds : rec'd IV lasix and indocin on 9/17.  No contrast or acei/ ARB while here Renal US showed 10- 11 cm kidneys w/o hydro, +^'d cortical echo bilat, no hydro  UA 9/15 was negative  I/O here > 11L in and 3 L out, +8L    Impression/Plan: 1. AKI on CKD 3 - baseline creat  1.4.  Suspect hypoperfusion/ nsaids/ soft BP's.  Creat continues to rise.  Poor HD candidate given comorbidities.  Palliative care working w/ pt/ wife.  Avoid contrast/ acei/ arb/ nsaids. Will follow 2. Painless jaundice/6LFT's/ biliary obstruction - w/u in progress 3. HTN - bp's meds on hold 4. DM 2 - long standing 5. CKD 3 - baseline creat 1.2. -1.5 6. Myoclonic jerking - dc'd gabapentin   Kelly Splinter MD [facility] pager [phone]   11/28/2017, 2:43 PM   Recent Labs  Lab 11/22/17 1908  11/25/17 0526 11/26/17 0330 11/27/17 0414 11/28/17 0847  NA 134*   < > 133* 136 138 136  K 3.6   < > 3.6 4.1 3.6 4.3  CL 102   < > 103 103 106 103  CO2 17*   < > 18* 21* 20* 19*  GLUCOSE 249*   < > 196* 194* 66* 106*  BUN 55*   < > 44* 48* 51* 55*  CREATININE 2.73*   < > 2.98* 3.37* 3.86* 4.42*  CALCIUM 10.0   < > 8.8* 8.5* 8.6* 8.8*  PHOS  --    < > 3.0 3.4  --   --   ALBUMIN 2.9*   < > 1.9* 1.8* 1.9* 1.9*  INR 1.38  --   --   --   --   --    < > =  values in this interval not displayed.   Recent Labs  Lab 11/27/17 0414 11/28/17 0847  AST 143* 134*  ALT 226* 195*  ALKPHOS 1,434* 1,362*  BILITOT 33.1* 31.7*  PROT 5.2* 5.5*   Recent Labs  Lab 11/25/17 0526 11/26/17 0330 11/27/17 0414 11/28/17 0847  WBC 7.0 7.1 5.1 8.8  NEUTROABS 4.7 5.2  --   --   HGB 10.1* 8.9* 9.5* 9.8*  HCT 31.0* 27.3* 29.7* 30.8*  MCV 83.3 84.3 84.6 84.6  PLT 205 175 140* 170

## 2017-11-29 ENCOUNTER — Encounter (HOSPITAL_COMMUNITY): Payer: Self-pay | Admitting: Internal Medicine

## 2017-11-29 DIAGNOSIS — N179 Acute kidney failure, unspecified: Secondary | ICD-10-CM

## 2017-11-29 DIAGNOSIS — K869 Disease of pancreas, unspecified: Secondary | ICD-10-CM

## 2017-11-29 LAB — COMPREHENSIVE METABOLIC PANEL
ALT: 171 U/L — AB (ref 0–44)
AST: 157 U/L — AB (ref 15–41)
Albumin: 1.8 g/dL — ABNORMAL LOW (ref 3.5–5.0)
Alkaline Phosphatase: 1237 U/L — ABNORMAL HIGH (ref 38–126)
Anion gap: 15 (ref 5–15)
BUN: 61 mg/dL — ABNORMAL HIGH (ref 8–23)
CO2: 18 mmol/L — AB (ref 22–32)
Calcium: 8.9 mg/dL (ref 8.9–10.3)
Chloride: 103 mmol/L (ref 98–111)
Creatinine, Ser: 4.56 mg/dL — ABNORMAL HIGH (ref 0.61–1.24)
GFR, EST AFRICAN AMERICAN: 12 mL/min — AB (ref >60)
GFR, EST NON AFRICAN AMERICAN: 11 mL/min — AB (ref >60)
Glucose, Bld: 129 mg/dL — ABNORMAL HIGH (ref 70–99)
POTASSIUM: 6.2 mmol/L — AB (ref 3.5–5.1)
SODIUM: 136 mmol/L (ref 135–145)
Total Bilirubin: 31.9 mg/dL (ref 0.3–1.2)
Total Protein: 5.2 g/dL — ABNORMAL LOW (ref 6.5–8.1)

## 2017-11-29 LAB — RENAL FUNCTION PANEL
Albumin: 1.8 g/dL — ABNORMAL LOW (ref 3.5–5.0)
Anion gap: 13 (ref 5–15)
BUN: 64 mg/dL — ABNORMAL HIGH (ref 8–23)
CO2: 21 mmol/L — ABNORMAL LOW (ref 22–32)
Calcium: 8.8 mg/dL — ABNORMAL LOW (ref 8.9–10.3)
Chloride: 103 mmol/L (ref 98–111)
Creatinine, Ser: 4.62 mg/dL — ABNORMAL HIGH (ref 0.61–1.24)
GFR calc Af Amer: 12 mL/min — ABNORMAL LOW (ref >60)
GFR calc non Af Amer: 11 mL/min — ABNORMAL LOW (ref >60)
Glucose, Bld: 120 mg/dL — ABNORMAL HIGH (ref 70–99)
Phosphorus: 4.9 mg/dL — ABNORMAL HIGH (ref 2.5–4.6)
Potassium: 5 mmol/L (ref 3.5–5.1)
Sodium: 137 mmol/L (ref 135–145)

## 2017-11-29 LAB — GLUCOSE, CAPILLARY
GLUCOSE-CAPILLARY: 92 mg/dL (ref 70–99)
GLUCOSE-CAPILLARY: 117 mg/dL — AB (ref 70–99)
GLUCOSE-CAPILLARY: 112 mg/dL — AB (ref 70–99)
GLUCOSE-CAPILLARY: 139 mg/dL — AB (ref 70–99)
GLUCOSE-CAPILLARY: 85 mg/dL (ref 70–99)
Glucose-Capillary: 55 mg/dL — ABNORMAL LOW (ref 70–99)
Glucose-Capillary: 55 mg/dL — ABNORMAL LOW (ref 70–99)
Glucose-Capillary: 70 mg/dL (ref 70–99)
Glucose-Capillary: 120 mg/dL — ABNORMAL HIGH (ref 70–99)

## 2017-11-29 MED ORDER — SODIUM POLYSTYRENE SULFONATE 15 GM/60ML PO SUSP
30.0000 g | Freq: Once | ORAL | Status: AC
  Administered 2017-11-29: 30 g via ORAL
  Filled 2017-11-29: qty 120

## 2017-11-29 NOTE — Progress Notes (Signed)
[facility] Progress Note  Subjective: UOP 550 cc yest, creat rising up to 4.6 today. Note that pt's wife states she doesn't want any further w/u and is asking for hospice w/u.    Vitals:   11/28/17 2132 11/29/17 0452 11/29/17 0759 11/29/17 1409  BP: 127/67 121/83  109/78  Pulse: 67 67  (!) 102  Resp: 16 17  15   Temp: 97.8 F (36.6 C) (!) 97.4 F (36.3 C)  98.5 F (36.9 C)  TempSrc: Oral Oral  Oral  SpO2: 95% 94%  98%  Weight:   80.7 kg   Height:        Inpatient medications: . feeding supplement (ENSURE ENLIVE)  237 mL Oral BID BM  . heparin  5,000 Units Subcutaneous Q8H  . Influenza vac split quadrivalent PF  0.5 mL Intramuscular Tomorrow-1000  . insulin aspart  0-9 Units Subcutaneous TID WC  . insulin glargine  10 Units Subcutaneous Daily  . levothyroxine  137 mcg Oral QAC breakfast  . tamsulosin  0.4 mg Oral Daily   . piperacillin-tazobactam (ZOSYN)  IV 2.25 g (11/29/17 1558)   guaiFENesin-dextromethorphan, HYDROcodone-acetaminophen, hydrOXYzine, ondansetron **OR** ondansetron (ZOFRAN) IV  Iron/TIBC/Ferritin/ %Sat No results found for: IRON, TIBC, FERRITIN, IRONPCTSAT  Home meds:  - amlodipine 5 qd  - victoza 1.2mg  qd/ metformin 1gm qd/ insulin glargine 16u/ glimepiride 4mg  qd  - aspirin 81/ atorvastatin 10 qd/ esomeprazole 40 qd/ T4 137ug/ tamsulosin 0.4  - gabapentin 300 bid/ hydrocodone-acetaminophen 5-325 q 6 prn   Exam: Gen alert, marked jaundice, no distress, HOH  No jvd or bruits Chest clear bilat to bases RRR no MRG Abd soft ntnd no mass or ascites +bs, protuberant GU normal male MS no joint effusions or deformity Ext no LE or UE edema Neuro is alert, Ox 3 , nf, +myoclonic jerking    IP meds : rec'd IV lasix and indocin on 9/17.  No contrast or acei/ ARB while here Renal US showed 10- 11 cm kidneys w/o hydro, +^'d cortical echo bilat, no hydro  UA 9/15 was negative  I/O here > 11L in and 3 L out, +8L     Impression/Plan: 1. AKI on CKD 3 - baseline creat 1.4.  Suspect hypoperfusion/ nsaids/ soft BP's.  Per primary MD the focus is changing over to hospice care.  No new suggestions.  Will sign off.  2. Painless jaundice/6LFT's/ biliary obstruction 3. HTN - bp's meds on hold 4. DM 2 - long standing 5. CKD 3 - baseline creat 1.2. -1.5 6. Myoclonic jerking - dc'd gabapentin   Kelly Splinter MD [facility] pager [phone]   11/29/2017, 4:49 PM   Recent Labs  Lab 11/22/17 1908  11/26/17 0330  11/29/17 0514 11/29/17 0839  NA 134*   < > 136   < > 136 137  K 3.6   < > 4.1   < > 6.2* 5.0  CL 102   < > 103   < > 103 103  CO2 17*   < > 21*   < > 18* 21*  GLUCOSE 249*   < > 194*   < > 129* 120*  BUN 55*   < > 48*   < > 61* 64*  CREATININE 2.73*   < > 3.37*   < > 4.56* 4.62*  CALCIUM 10.0   < > 8.5*   < > 8.9 8.8*  PHOS  --    < > 3.4  --   --  4.9*  ALBUMIN 2.9*   < > 1.8*   < > 1.8* 1.8*  INR 1.38  --   --   --   --   --    < > = values in this interval not displayed.   Recent Labs  Lab 11/28/17 0847 11/29/17 0514  AST 134* 157*  ALT 195* 171*  ALKPHOS 1,362* 1,237*  BILITOT 31.7* 31.9*  PROT 5.5* 5.2*   Recent Labs  Lab 11/25/17 0526 11/26/17 0330 11/27/17 0414 11/28/17 0847  WBC 7.0 7.1 5.1 8.8  NEUTROABS 4.7 5.2  --   --   HGB 10.1* 8.9* 9.5* 9.8*  HCT 31.0* 27.3* 29.7* 30.8*  MCV 83.3 84.3 84.6 84.6  PLT 205 175 140* 170

## 2017-11-29 NOTE — Progress Notes (Signed)
PMT progress note  84 yo gentleman with life limiting illness of pancreatic head mass, admitted with severe painless jaundice, elevated LFTs, hyper bilirubinemia. Hospital course also complicated by AKI, also has III CKD, DM, HTN, HLD. Patient with weight loss, nausea vomiting.   PMT has been consulted for facilitating goals of care discussions, following hospital course and helping address conversations.   There was to be a second attempt for biliary stent placement on 12-01-17. How ever, wife called and left a message on PMT main line stating that she didn't want the patient to undergo the procedure, she wished to discuss with our NP Zachary Cook about other options.   PMT continues to follow. Tried calling patient's wife's number, both cell and home line, several times today on 11-29-17, with no results.   Wife not in room, patient appears icteric. He appears to be getting weaker.     BP 121/83 (BP Location: Left Arm)   Pulse 67   Temp (!) 97.4 F (36.3 C) (Oral)   Resp 17   Ht 5\' 4"  (1.626 m)   Wt 80.7 kg   SpO2 94%   BMI 30.55 kg/m    Labs and imaging noted. Renal notes reviewed as well.   Patient is icteric, appears with generalized weakness.  S 1 S2 Regular breath sounds Abdomen is not distended No edema  He answers a few questions appropriately.   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  AKI III CKD Biliary obstruction.   Chart reviewed, discussed with TRH MD on 11-28-17.   PMT to continue to follow, patient appears to have a high risk for ongoing decline/decompensation. Recommend residential hospice on discharge.  15 minutes spent.   Zachary Chance MD [facility] health palliative medicine team [phone]

## 2017-11-29 NOTE — Evaluation (Signed)
Physical Therapy Evaluation Patient Details Name: Zachary Cook MRN: [id number] DOB: [birth year] Today's Date: 11/29/2017   History of Present Illness  Pt admitted 2* weakness and abdomial pain 2* pancreatic mass.  Pt with hx of Dm, chronic anemia, and peripheral nueropathy  Clinical Impression  Pt admitted as above and presenting with functional mobility limitations 2* significant generalized weakness and significant balance deficits.  Pt would benefit from follow up at SNF level to maximize IND and safety.      Follow Up Recommendations SNF    Equipment Recommendations  None recommended by PT    Recommendations for Other Services       Precautions / Restrictions Precautions Precautions: Fall Restrictions Weight Bearing Restrictions: No      Mobility  Bed Mobility Overal bed mobility: Needs Assistance Bed Mobility: Supine to Sit     Supine to sit: Min assist;Mod assist     General bed mobility comments: cues for sequence and physical assist to manage LEs and to bring trunk to upright sitting  Transfers Overall transfer level: Needs assistance Equipment used: Rolling walker (2 wheeled) Transfers: Sit to/from Stand Sit to Stand: Mod assist;+2 physical assistance;+2 safety/equipment         General transfer comment: cues for use of UEs to self assist.  Physical assist to bring wt up and fwd and to balance in standing with RW.    Ambulation/Gait Ambulation/Gait assistance: Min assist;Mod assist;+2 physical assistance;+2 safety/equipment Gait Distance (Feet): 3 Feet Assistive device: Rolling walker (2 wheeled) Gait Pattern/deviations: Step-to pattern;Decreased step length - right;Decreased step length - left;Shuffle;Trunk flexed;Narrow base of support;Scissoring Gait velocity: decr   General Gait Details: pt with intermittent knee buckling requiring assist for support as well as balance.  Stairs            Wheelchair Mobility    Modified Rankin  (Stroke Patients Only)       Balance Overall balance assessment: Needs assistance Sitting-balance support: Feet supported;No upper extremity supported Sitting balance-Leahy Scale: Fair     Standing balance support: Bilateral upper extremity supported Standing balance-Leahy Scale: Poor                               Pertinent Vitals/Pain Pain Assessment: No/denies pain    Home Living Family/patient expects to be discharged to:: Skilled nursing facility                      Prior Function Level of Independence: Needs assistance         Comments: Pt spent most of his time sleeping per spouse prior to her leaving room     Hand Dominance        Extremity/Trunk Assessment   Upper Extremity Assessment Upper Extremity Assessment: Generalized weakness(and tremor)    Lower Extremity Assessment Lower Extremity Assessment: Generalized weakness(and tremor)       Communication   Communication: HOH  Cognition Arousal/Alertness: Awake/alert Behavior During Therapy: Flat affect Overall Cognitive Status: History of cognitive impairments - at baseline                                        General Comments      Exercises     Assessment/Plan    PT Assessment Patient needs continued PT services  PT Problem List Decreased strength;Decreased activity tolerance;Decreased balance;Decreased mobility;Decreased  cognition;Decreased knowledge of use of DME       PT Treatment Interventions DME instruction;Gait training;Functional mobility training;Therapeutic activities;Therapeutic exercise;Balance training;Patient/family education    PT Goals (Current goals can be found in the Care Plan section)  Acute Rehab PT Goals Patient Stated Goal: No specific goals expressed PT Goal Formulation: Patient unable to participate in goal setting Time For Goal Achievement: 12/13/17 Potential to Achieve Goals: Fair    Frequency Min 3X/week   Barriers  to discharge        Co-evaluation               AM-PAC PT "6 Clicks" Daily Activity  Outcome Measure Difficulty turning over in bed (including adjusting bedclothes, sheets and blankets)?: A Lot Difficulty moving from lying on back to sitting on the side of the bed? : Unable Difficulty sitting down on and standing up from a chair with arms (e.g., wheelchair, bedside commode, etc,.)?: Unable Help needed moving to and from a bed to chair (including a wheelchair)?: A Lot Help needed walking in hospital room?: A Lot Help needed climbing 3-5 steps with a railing? : Total 6 Click Score: 9    End of Session Equipment Utilized During Treatment: Gait belt Activity Tolerance: Patient limited by fatigue Patient left: in chair;with call bell/phone within reach;with chair alarm set Nurse Communication: Mobility status PT Visit Diagnosis: History of falling (Z91.81);Muscle weakness (generalized) (M62.81);Difficulty in walking, not elsewhere classified (R26.2)    Time: 5188-4166 PT Time Calculation (min) (ACUTE ONLY): 25 min   Charges:   PT Evaluation $PT Eval Low Complexity: 1 Low PT Treatments $Therapeutic Activity: 8-22 mins        Debe Coder PT Acute Rehabilitation Services Pager [phone] Office [phone]   [name] 11/29/2017, 4:45 PM

## 2017-11-29 NOTE — Progress Notes (Signed)
PROGRESS NOTE    Zachary Cook  OEU:[id number] DOB: [birth year] DOA: 11/22/2017 PCP: [name], MD   Brief Narrative:  HPI On 11/22/2017 by Dr. Laurey Arrow Zachary Cook is a 84 y.o. male with medical history significant for hypothyroid, htn, gi bleed (diverticulosis, s/p partial colectomy), incisional hernia repair 07/2017, T2DM, who presents with above.  Symptoms began 5 wks ago and have progressively worsened. Began feeling fatigued, then at times dizzy when ambulating. Then developed nausea, worse with eating, with vomiting after most meals. PO intake decreased, says has lost 30-40 pounds. Began to develop jaundice about a week ago. Says was evaluated by PCP early in this course and says he thinks pcp wasn't much concerned, sounds like symptoms mild at that time. Also had an episode of white/tan stools that have resolved. No fevers. No abdominal pain. No diarrhea, no blood in stool. No new meds. Not a drinker. No history liver or gallbladder problems. No history cancer.  Interim history  Presented with jaundice, nausea and vomiting.  Admitted and found to have hyperbilirubinemia along with elevated LFTs.  GI consulted, MRCP done.  S/p unsuccessful ERCP on 9/18. Pending further GI recommendations.  11/29/2017: Discussed with patient's wife.  Patient's wife does not want any further work-up.  Patient's wife has asked for hospice input.  Consult social work to assist with arranging hospice input.  Assessment & Plan   Severe painless jaundice/hyperbilirubinemia/elevated LFTs secondary to pancreatitic head mass -Abdominal ultrasound showed contracted gallbladder with moderate cholelithiasis.  Mild dilatation of common bile duct.  Mild hepatic steatosis. -LFTs as well as bilirubin and alk phos all elevated upon admission -Gastroenterology consulted and appreciated recommended MRCP which showed no prominent dilatation of the biliary tree and of the dorsal pancreatic duct, findings  concerning for possibility of adenocarcinoma pancreatic head.  Multiple gallstones. -Status post unsuccessful ERCP on 11/26/17 -GI planning for another ERCP attempt on 12/01/2017 -Continue IV Zosyn as well as antiemetics -Oncology consulted and appreciated 11/29/2017: Patient's wife has opted for hospice directed care.  Acute kidney injury on chronic kidney disease, stage III with metabolic acidosis -Likely secondary to poor oral intake -Creatinine baseline approximately 1.5-1.6, on admission 2.73, currently up to 4.42 -Continue IV fluids and monitor BMP -Renal US: Medical renal disease, no hydronephrosis -Avoid nephrotoxic agents  -of note, patient was given IV lasix and indomethacin on 11/25/2017 -Nephrology consulted and appreciated -BP mildly low 11/29/2017: See above.  For hospice directed care as per patient's wife's wish.  Diabetes mellitus, type II complicated by hypoglycemia -Metformin, Victoza, glipizide held -Decrease lantus to 10u, and changed ISS to sensitive  -Continue CBG monitoring 11/29/2017: Further care will depend on goal of care.  Essential hypertension -continue to hold amlodipine  11/29/2017: Further care will depend on goal of care  Hyperlipidemia -Statin held due to elevated LFTs Further care will depend on goal of care.  BPH -Continue tamsulosin  Hypothyroidism -Continue Synthroid, TSH checked 0.949  Peripheral neuropathy -Continue gabapentin  Hypomagnesemia -Resolved with replacement  Hypokalemia -Resolved with replacement -Further care will depend on goal of care.  Chronic normocytic Anemia -hemoglobin currently 9.8 -baseline hemoglobin around 11-12, however April 2019, Hemoglobin was around 8-9  Fall -Unwitnessed in the bathroom -CT head unremarkable for acute intracranial abnormalities Further care will depend on goal of care  DVT Prophylaxis  Heparin  Code Status: DNR  Family Communication: Wife at bedside  Disposition Plan: This  will depend on goal of care.   Consultants Gastroenterology Oncology Palliative Care  Procedures  MRCP ERCP  Antibiotics   Anti-infectives (From admission, onward)   Start     Dose/Rate Route Frequency Ordered Stop   11/24/17 1400  piperacillin-tazobactam (ZOSYN) IVPB 2.25 g     2.25 g 100 mL/hr over 30 Minutes Intravenous Every 8 hours 11/24/17 1043        Subjective:  No significant history from the patient.  Patient's wife has opted for hospice input. Objective:   Vitals:   11/28/17 1509 11/28/17 2132 11/29/17 0452 11/29/17 0759  BP: 111/68 127/67 121/83   Pulse: 65 67 67   Resp: '14 16 17   '$ Temp: 98.2 F (36.8 C) 97.8 F (36.6 C) (!) 97.4 F (36.3 C)   TempSrc: Oral Oral Oral   SpO2: 98% 95% 94%   Weight:    80.7 kg  Height:        Intake/Output Summary (Last 24 hours) at 11/29/2017 1215 Last data filed at 11/29/2017 1012 Gross per 24 hour  Intake 1370.76 ml  Output 750 ml  Net 620.76 ml   Filed Weights   11/22/17 1859 11/25/17 1125 11/29/17 0759  Weight: 66.7 kg 73.4 kg 80.7 kg   Exam  General: Severely jaundiced.  HEENT: Severely jaundiced.  Mild pallor.  Neck: Supple  Cardiovascular: S1 S2.  Respiratory: Clear to auscultation.  Abdomen: Soft, nontender, nondistended, + bowel sounds  Extremities: warm dry without cyanosis clubbing or edema  Data Reviewed: I have personally reviewed following labs and imaging studies  CBC: Recent Labs  Lab 11/22/17 1908  11/24/17 0822 11/25/17 0526 11/26/17 0330 11/27/17 0414 11/28/17 0847  WBC 7.0   < > 6.3 7.0 7.1 5.1 8.8  NEUTROABS 5.2  --  4.1 4.7 5.2  --   --   HGB 11.9*   < > 10.7* 10.1* 8.9* 9.5* 9.8*  HCT 36.1*   < > 33.1* 31.0* 27.3* 29.7* 30.8*  MCV 85.1   < > 84.4 83.3 84.3 84.6 84.6  PLT 233   < > 203 205 175 140* 170   < > = values in this interval not displayed.   Basic Metabolic Panel: Recent Labs  Lab 11/24/17 0822 11/25/17 0526 11/26/17 0330 11/27/17 0414 11/28/17 0847  11/29/17 0514 11/29/17 0839  NA 132* 133* 136 138 136 136 137  K 3.4* 3.6 4.1 3.6 4.3 6.2* 5.0  CL 106 103 103 106 103 103 103  CO2 16* 18* 21* 20* 19* 18* 21*  GLUCOSE 105* 196* 194* 66* 106* 129* 120*  BUN 48* 44* 48* 51* 55* 61* 64*  CREATININE 2.91* 2.98* 3.37* 3.86* 4.42* 4.56* 4.62*  CALCIUM 8.7* 8.8* 8.5* 8.6* 8.8* 8.9 8.8*  MG 1.4* 2.0 1.9  --  1.9  --   --   PHOS 3.3 3.0 3.4  --   --   --  4.9*   GFR: Estimated Creatinine Clearance: 11.6 mL/min (A) (by C-G formula based on SCr of 4.62 mg/dL (H)). Liver Function Tests: Recent Labs  Lab 11/25/17 0526 11/26/17 0330 11/27/17 0414 11/28/17 0847 11/29/17 0514 11/29/17 0839  AST 419* 241* 143* 134* 157*  --   ALT 377* 282* 226* 195* 171*  --   ALKPHOS 2,000* 1,546* 1,434* 1,362* 1,237*  --   BILITOT 28.9* 28.3* 33.1* 31.7* 31.9*  --   PROT 5.5* 5.1* 5.2* 5.5* 5.2*  --   ALBUMIN 1.9* 1.8* 1.9* 1.9* 1.8* 1.8*   Recent Labs  Lab 11/22/17 1908 11/25/17 0526  LIPASE 67* 51   No results  for input(s): AMMONIA in the last 168 hours. Coagulation Profile: Recent Labs  Lab 11/22/17 1908  INR 1.38   Cardiac Enzymes: No results for input(s): CKTOTAL, CKMB, CKMBINDEX, TROPONINI in the last 168 hours. BNP (last 3 results) No results for input(s): PROBNP in the last 8760 hours. HbA1C: No results for input(s): HGBA1C in the last 72 hours. CBG: Recent Labs  Lab 11/29/17 0243 11/29/17 0308 11/29/17 0454 11/29/17 0741 11/29/17 1128  GLUCAP 70 92 117* 112* 120*   Lipid Profile: No results for input(s): CHOL, HDL, LDLCALC, TRIG, CHOLHDL, LDLDIRECT in the last 72 hours. Thyroid Function Tests: No results for input(s): TSH, T4TOTAL, FREET4, T3FREE, THYROIDAB in the last 72 hours. Anemia Panel: No results for input(s): VITAMINB12, FOLATE, FERRITIN, TIBC, IRON, RETICCTPCT in the last 72 hours. Urine analysis:    Component Value Date/Time   COLORURINE AMBER (A) 11/23/2017 1009   APPEARANCEUR CLEAR 11/23/2017 1009    LABSPEC 1.008 11/23/2017 1009   PHURINE 5.0 11/23/2017 1009   GLUCOSEU 50 (A) 11/23/2017 1009   GLUCOSEU NEGATIVE 07/24/2017 0937   HGBUR SMALL (A) 11/23/2017 1009   BILIRUBINUR MODERATE (A) 11/23/2017 1009   KETONESUR NEGATIVE 11/23/2017 1009   PROTEINUR NEGATIVE 11/23/2017 1009   UROBILINOGEN 0.2 07/24/2017 0937   NITRITE NEGATIVE 11/23/2017 1009   LEUKOCYTESUR NEGATIVE 11/23/2017 1009   Sepsis Labs: '@LABRCNTIP'$ (procalcitonin:4,lacticidven:4)  )No results found for this or any previous visit (from the past 240 hour(s)).    Radiology Studies: No results found.   Scheduled Meds: . feeding supplement (ENSURE ENLIVE)  237 mL Oral BID BM  . heparin  5,000 Units Subcutaneous Q8H  . Influenza vac split quadrivalent PF  0.5 mL Intramuscular Tomorrow-1000  . insulin aspart  0-9 Units Subcutaneous TID WC  . insulin glargine  10 Units Subcutaneous Daily  . levothyroxine  137 mcg Oral QAC breakfast  . tamsulosin  0.4 mg Oral Daily   Continuous Infusions: . piperacillin-tazobactam (ZOSYN)  IV 2.25 g (11/29/17 0603)     LOS: 7 days   Time Spent in minutes  25 minutes  Bonnell Public M.D. on 11/29/2017 at 12:15 PM  Between 7am to 7pm - Please see pager noted on [email or web address]  After 7pm go to [email or web address]  And look for the night coverage person covering for me after hours  [facility] Office  [phone]

## 2017-11-30 ENCOUNTER — Encounter (HOSPITAL_COMMUNITY): Payer: Self-pay | Admitting: Internal Medicine

## 2017-11-30 DIAGNOSIS — C250 Malignant neoplasm of head of pancreas: Principal | ICD-10-CM

## 2017-11-30 LAB — GLUCOSE, CAPILLARY
GLUCOSE-CAPILLARY: 75 mg/dL (ref 70–99)
GLUCOSE-CAPILLARY: 117 mg/dL — AB (ref 70–99)
GLUCOSE-CAPILLARY: 100 mg/dL — AB (ref 70–99)
Glucose-Capillary: 59 mg/dL — ABNORMAL LOW (ref 70–99)
Glucose-Capillary: 70 mg/dL (ref 70–99)
Glucose-Capillary: 73 mg/dL (ref 70–99)

## 2017-11-30 LAB — COMPREHENSIVE METABOLIC PANEL
ALBUMIN: 1.7 g/dL — AB (ref 3.5–5.0)
ALT: 155 U/L — ABNORMAL HIGH (ref 0–44)
ANION GAP: 16 — AB (ref 5–15)
AST: 177 U/L — ABNORMAL HIGH (ref 15–41)
Alkaline Phosphatase: 1249 U/L — ABNORMAL HIGH (ref 38–126)
BILIRUBIN TOTAL: 30.9 mg/dL — AB (ref 0.3–1.2)
BUN: 73 mg/dL — ABNORMAL HIGH (ref 8–23)
CALCIUM: 9 mg/dL (ref 8.9–10.3)
CO2: 18 mmol/L — ABNORMAL LOW (ref 22–32)
Chloride: 107 mmol/L (ref 98–111)
Creatinine, Ser: 5.2 mg/dL — ABNORMAL HIGH (ref 0.61–1.24)
GFR calc Af Amer: 11 mL/min — ABNORMAL LOW (ref >60)
GFR calc non Af Amer: 9 mL/min — ABNORMAL LOW (ref >60)
GLUCOSE: 64 mg/dL — AB (ref 70–99)
POTASSIUM: 4.2 mmol/L (ref 3.5–5.1)
SODIUM: 141 mmol/L (ref 135–145)
TOTAL PROTEIN: 5 g/dL — AB (ref 6.5–8.1)

## 2017-11-30 NOTE — Progress Notes (Addendum)
PMT progress note  Received call from patient's bedside RN Ms Jocelyn Lamer, that the patient's son [name] had arrived at the bedside.   I discussed with [name] over the phone.   Patient's life limiting illness is :  Obstructive jaundice from pancreatic cancer Renal failure - progressive.  I explained to him that biliary stenting, as explained to [place] by our GI colleagues is no longer indicated as it may cause more harm than benefit.   The patient continues to decline. I explained to him frankly and compassionately, that the patient is soon approaching end of life.   I discussed with him about DNR DNI, comfort measures  I explained to him in detail about hospice philosophy of care and that we recommend residential hospice.  Son [name] is agreeable to hospice. Will discuss with CSW in am about residential hospice.   Continue current mode of care for now.   No charge note   Loistine Chance MD [facility] health palliative medicine team [phone]

## 2017-11-30 NOTE — Progress Notes (Signed)
PROGRESS NOTE    Zachary Cook  KCM:[id number] DOB: [birth year] DOA: 11/22/2017 PCP: [name], MD   Brief Narrative:  HPI On 11/22/2017 by Dr. Laurey Arrow Zachary Cook is a 84 y.o. male with medical history significant for hypothyroid, htn, gi bleed (diverticulosis, s/p partial colectomy), incisional hernia repair 07/2017, T2DM, who presents with above.  Symptoms began 5 wks ago and have progressively worsened. Began feeling fatigued, then at times dizzy when ambulating. Then developed nausea, worse with eating, with vomiting after most meals. PO intake decreased, says has lost 30-40 pounds. Began to develop jaundice about a week ago. Says was evaluated by PCP early in this course and says he thinks pcp wasn't much concerned, sounds like symptoms mild at that time. Also had an episode of white/tan stools that have resolved. No fevers. No abdominal pain. No diarrhea, no blood in stool. No new meds. Not a drinker. No history liver or gallbladder problems. No history cancer.  Interim history  Presented with jaundice, nausea and vomiting.  Admitted and found to have hyperbilirubinemia along with elevated LFTs.  GI consulted, MRCP done.  S/p unsuccessful ERCP on 9/18. Pending further GI recommendations.  11/29/2017: Discussed with patient's wife.  Patient's wife does not want any further work-up.  Patient's wife has asked for hospice input.  Consult social work to assist with arranging hospice input.  Assessment & Plan   Severe painless jaundice/hyperbilirubinemia/elevated LFTs secondary to pancreatitic head mass -Abdominal ultrasound showed contracted gallbladder with moderate cholelithiasis.  Mild dilatation of common bile duct.  Mild hepatic steatosis. -LFTs as well as bilirubin and alk phos all elevated upon admission -Gastroenterology consulted and appreciated recommended MRCP which showed no prominent dilatation of the biliary tree and of the dorsal pancreatic duct, findings  concerning for possibility of adenocarcinoma pancreatic head.  Multiple gallstones. -Status post unsuccessful ERCP on 11/26/17 -GI planning for another ERCP attempt on 12/01/2017 -Continue IV Zosyn as well as antiemetics -Oncology consulted and appreciated 11/29/2017: Patient's wife has opted for hospice directed care. 11/30/2017: Patient remains very ill.  Poor prognosis.  Worsening renal function.  Nephrology has signed off as the patient is not a dialysis candidate, and the prognosis was said to be very poor.  Discussed with the GI physician, Dr. Carlean Purl.  Dr. Carlean Purl does not plan to proceed with any further GI procedures, and advised hospice care.  Also discussed with palliative care team.  Discussed above with patient's son, Zachary Cook [phone]).   Patient's son intends to meet with elective care team tomorrow.    Acute kidney injury on chronic kidney disease, stage III with metabolic acidosis -Likely secondary to poor oral intake -Creatinine baseline approximately 1.5-1.6, on admission 2.73, currently up to 4.42 -Continue IV fluids and monitor BMP -Renal US: Medical renal disease, no hydronephrosis -Avoid nephrotoxic agents  -of note, patient was given IV lasix and indomethacin on 11/25/2017 -Nephrology consulted and appreciated -BP mildly low 11/29/2017: See above.  For hospice directed care as per patient's wife's wish. 11/30/2017: Nephrology has signed off.  Poor prognosis.  Diabetes mellitus, type II complicated by hypoglycemia -Metformin, Victoza, glipizide held -Decrease lantus to 10u, and changed ISS to sensitive  -Continue CBG monitoring 11/29/2017: Further care will depend on goal of care.  Essential hypertension -continue to hold amlodipine  11/29/2017: Further care will depend on goal of care  Hyperlipidemia -Statin held due to elevated LFTs Further care will depend on goal of care.  BPH -Continue tamsulosin  Hypothyroidism -Continue Synthroid, TSH checked  0.949  Peripheral neuropathy -Continue gabapentin  Hypomagnesemia -Resolved with replacement  Hypokalemia -Resolved with replacement -Further care will depend on goal of care.  Chronic normocytic Anemia -hemoglobin currently 9.8 -baseline hemoglobin around 11-12, however April 2019, Hemoglobin was around 8-9  Fall -Unwitnessed in the bathroom -CT head unremarkable for acute intracranial abnormalities Further care will depend on goal of care  DVT Prophylaxis  Heparin  Code Status: DNR  Family Communication: Wife at bedside  Disposition Plan: This will depend on goal of care.   Consultants Gastroenterology Oncology Palliative Care  Procedures  MRCP ERCP  Antibiotics   Anti-infectives (From admission, onward)   Start     Dose/Rate Route Frequency Ordered Stop   11/24/17 1400  piperacillin-tazobactam (ZOSYN) IVPB 2.25 g     2.25 g 100 mL/hr over 30 Minutes Intravenous Every 8 hours 11/24/17 1043        Subjective:  No significant history from the patient.    Objective:   Vitals:   11/29/17 2322 11/30/17 0500 11/30/17 0557 11/30/17 1350  BP: 110/64  112/68 96/69  Pulse: 70  68 100  Resp: '16  17 17  '$ Temp: 98.3 F (36.8 C)  97.9 F (36.6 C) 99 F (37.2 C)  TempSrc: Oral  Oral Oral  SpO2: 99%  99% 94%  Weight:  99.3 kg    Height:        Intake/Output Summary (Last 24 hours) at 11/30/2017 1401 Last data filed at 11/30/2017 1000 Gross per 24 hour  Intake 390 ml  Output 1075 ml  Net -685 ml   Filed Weights   11/25/17 1125 11/29/17 0759 11/30/17 0500  Weight: 73.4 kg 80.7 kg 99.3 kg   Exam  General: Severely jaundiced.  Not able to participate in any discussion.  HEENT: Severely jaundiced.  Mild pallor.  Neck: Supple  Cardiovascular: S1 S2.  Respiratory: Clear to auscultation.  Abdomen: Soft, nontender, nondistended, + bowel sounds  Extremities: warm dry without cyanosis clubbing or edema  Data Reviewed: I have personally reviewed  following labs and imaging studies  CBC: Recent Labs  Lab 11/24/17 0822 11/25/17 0526 11/26/17 0330 11/27/17 0414 11/28/17 0847  WBC 6.3 7.0 7.1 5.1 8.8  NEUTROABS 4.1 4.7 5.2  --   --   HGB 10.7* 10.1* 8.9* 9.5* 9.8*  HCT 33.1* 31.0* 27.3* 29.7* 30.8*  MCV 84.4 83.3 84.3 84.6 84.6  PLT 203 205 175 140* 191   Basic Metabolic Panel: Recent Labs  Lab 11/24/17 0822 11/25/17 0526 11/26/17 0330 11/27/17 0414 11/28/17 0847 11/29/17 0514 11/29/17 0839 11/30/17 0748  NA 132* 133* 136 138 136 136 137 141  K 3.4* 3.6 4.1 3.6 4.3 6.2* 5.0 4.2  CL 106 103 103 106 103 103 103 107  CO2 16* 18* 21* 20* 19* 18* 21* 18*  GLUCOSE 105* 196* 194* 66* 106* 129* 120* 64*  BUN 48* 44* 48* 51* 55* 61* 64* 73*  CREATININE 2.91* 2.98* 3.37* 3.86* 4.42* 4.56* 4.62* 5.20*  CALCIUM 8.7* 8.8* 8.5* 8.6* 8.8* 8.9 8.8* 9.0  MG 1.4* 2.0 1.9  --  1.9  --   --   --   PHOS 3.3 3.0 3.4  --   --   --  4.9*  --    GFR: Estimated Creatinine Clearance: 11.4 mL/min (A) (by C-G formula based on SCr of 5.2 mg/dL (H)). Liver Function Tests: Recent Labs  Lab 11/26/17 0330 11/27/17 0414 11/28/17 0847 11/29/17 0514 11/29/17 0839 11/30/17 0748  AST 241* 143* 134*  157*  --  177*  ALT 282* 226* 195* 171*  --  155*  ALKPHOS 1,546* 1,434* 1,362* 1,237*  --  1,249*  BILITOT 28.3* 33.1* 31.7* 31.9*  --  30.9*  PROT 5.1* 5.2* 5.5* 5.2*  --  5.0*  ALBUMIN 1.8* 1.9* 1.9* 1.8* 1.8* 1.7*   Recent Labs  Lab 11/25/17 0526  LIPASE 51   No results for input(s): AMMONIA in the last 168 hours. Coagulation Profile: No results for input(s): INR, PROTIME in the last 168 hours. Cardiac Enzymes: No results for input(s): CKTOTAL, CKMB, CKMBINDEX, TROPONINI in the last 168 hours. BNP (last 3 results) No results for input(s): PROBNP in the last 8760 hours. HbA1C: No results for input(s): HGBA1C in the last 72 hours. CBG: Recent Labs  Lab 11/29/17 2215 11/30/17 0025 11/30/17 0739 11/30/17 0813 11/30/17 1159   GLUCAP 85 70 59* 75 117*   Lipid Profile: No results for input(s): CHOL, HDL, LDLCALC, TRIG, CHOLHDL, LDLDIRECT in the last 72 hours. Thyroid Function Tests: No results for input(s): TSH, T4TOTAL, FREET4, T3FREE, THYROIDAB in the last 72 hours. Anemia Panel: No results for input(s): VITAMINB12, FOLATE, FERRITIN, TIBC, IRON, RETICCTPCT in the last 72 hours. Urine analysis:    Component Value Date/Time   COLORURINE AMBER (A) 11/23/2017 1009   APPEARANCEUR CLEAR 11/23/2017 1009   LABSPEC 1.008 11/23/2017 1009   PHURINE 5.0 11/23/2017 1009   GLUCOSEU 50 (A) 11/23/2017 1009   GLUCOSEU NEGATIVE 07/24/2017 0937   HGBUR SMALL (A) 11/23/2017 1009   BILIRUBINUR MODERATE (A) 11/23/2017 1009   KETONESUR NEGATIVE 11/23/2017 1009   PROTEINUR NEGATIVE 11/23/2017 1009   UROBILINOGEN 0.2 07/24/2017 0937   NITRITE NEGATIVE 11/23/2017 1009   LEUKOCYTESUR NEGATIVE 11/23/2017 1009   Sepsis Labs: '@LABRCNTIP'$ (procalcitonin:4,lacticidven:4)  )No results found for this or any previous visit (from the past 240 hour(s)).    Radiology Studies: No results found.   Scheduled Meds: . feeding supplement (ENSURE ENLIVE)  237 mL Oral BID BM  . heparin  5,000 Units Subcutaneous Q8H  . Influenza vac split quadrivalent PF  0.5 mL Intramuscular Tomorrow-1000  . insulin aspart  0-9 Units Subcutaneous TID WC  . insulin glargine  10 Units Subcutaneous Daily  . levothyroxine  137 mcg Oral QAC breakfast  . tamsulosin  0.4 mg Oral Daily   Continuous Infusions: . piperacillin-tazobactam (ZOSYN)  IV 2.25 g (11/30/17 0639)     LOS: 8 days   Time Spent in minutes  25 minutes  Bonnell Public M.D. on 11/30/2017 at 2:01 PM  Between 7am to 7pm - Please see pager noted on [email or web address]  After 7pm go to [email or web address]  And look for the night coverage person covering for me after hours  [facility] Office  [phone]

## 2017-11-30 NOTE — Progress Notes (Addendum)
   Patient Name: Zachary Cook Date of Encounter: 11/30/2017, 10:54 AM    Subjective  confused   Objective  BP 112/68 (BP Location: Left Arm)   Pulse 68   Temp 97.9 F (36.6 C) (Oral)   Resp 17   Ht 5\' 4"  (1.626 m)   Wt 99.3 kg   SpO2 99%   BMI 37.59 kg/m  Awake, answers ? But not correctly re: date, place  Deeply jaundiced + asterixis/tremor  Kidney fx deteriorating more Lab Results  Component Value Date   CREATININE 5.20 (H) 11/30/2017   BUN 73 (H) 11/30/2017   NA 141 11/30/2017   K 4.2 11/30/2017   CL 107 11/30/2017   CO2 18 (L) 11/30/2017   Lab Results  Component Value Date   ALT 155 (H) 11/30/2017   AST 177 (H) 11/30/2017   ALKPHOS 1,249 (H) 11/30/2017   BILITOT 30.9 (HH) 11/30/2017        Assessment and Plan  Obstructive jaundice from pancreatic cancer Renal failure - progressive  He is terminal (soon) and even if we stented him it would not affect renal fx. ERCP is cancelled  He should go to hospice - further procedures are not in his best interest and could cause harm.  Discussed w/ Dr. Marcie Bal  Signing off  Gatha Mayer, MD, [facility] 11/30/2017 10:54 AM Pager [phone]

## 2017-11-30 NOTE — Progress Notes (Signed)
PMT progress note  84 yo gentleman with life limiting illness of pancreatic head mass, admitted with severe painless jaundice, elevated LFTs, hyper bilirubinemia. Hospital course also complicated by AKI, also has III CKD, DM, HTN, HLD. Patient presented with weight loss, nausea vomiting.   PMT has been consulted for facilitating goals of care discussions, following hospital course and helping address conversations.   There was to be a second attempt for biliary stent placement on 12-01-17. How ever, wife called and left a message on PMT main line stating that she didn't want the patient to undergo the procedure, she wished to discuss with our NP Ms Davis Gourd about other options.   PMT continues to follow. Tried calling patient's wife's number, both cell and home line, several times on 11-29-17, with no results.   11-30-17: I met with the patient's wife Zachary Cook who was at the bedside. Patient continues to appear icteric and with worsening generalized weakness  I have ordered a morning CMP, results pending, to assist with prognostication and to aid [place] in further goals of care discussions.     BP 112/68 (BP Location: Left Arm)   Pulse 68   Temp 97.9 F (36.6 C) (Oral)   Resp 17   Ht '5\' 4"'$  (1.626 m)   Wt 99.3 kg   SpO2 99%   BMI 37.59 kg/m    Labs and imaging noted. Renal notes reviewed as well.   Patient is icteric, appears with generalized weakness.  S 1 S2 Regular breath sounds Abdomen is not distended No edema  He answers a few questions appropriately.   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  AKI III CKD Biliary obstruction.  Functional decline.   Chart reviewed, discussed with TRH MD on 11-28-17.   PMT to continue to follow, patient appears to have a high risk for ongoing decline/decompensation. Family meeting: I met with the patient's wife outside the room. We sat down and discussed about the patient's current condition. He continues to have  progressive decline, has AKI on top of underlying stage III CKD. He has ongoing elevated LFTs.   Life review performed, patient and his wife have worked in [employer], [employer] for a long time. They have been married for 26 years. Patient has 2 sons, one son Zachary Cook lives locally, how ever, at present, he is at [employer], he is reportedly coming back in town on 12-01-17. The patient's wife Zachary Cook has 2 children, a son and a daughter of her own.   Wife talks about how the patient's health has been declining for some time now. She is appreciative of the information she has received from hospital staff. She recognizes that the patient likely has a serious and incurable illness.   She states that she is not in favor of a repeat attempt at ERCP, EUS and biliary stent placement. Instead, she would like to focus on comfort measures. How ever, she states that the patient's son Zachary Cook doesn't agree, she states that the patient's son Zachary Cook wants to be the primary decision maker for the patient. Wife is not sure whether Zachary Cook has HCPOA paperwork for the patient.   I have offered a family meeting with both son and wife, to sit down and to have conversations about the patient's overall health and to establish meaningful goals of care. Wife to let us know about the time of the meeting tomorrow 12-01-17. Wife Zachary Cook is stressed out and tearful. I offered her my support. I re iterated  that we always recommend making serious and important decisions together, with the whole family as a unit and to this extent, we will help facilitate conversations.   Further recommendations to follow the outcome of the family meeting. Continue current mode of care for now.    35 minutes spent.   Loistine Chance MD [facility] health palliative medicine team [phone]

## 2017-12-01 ENCOUNTER — Encounter (HOSPITAL_COMMUNITY)
Admission: EM | Disposition: A | Discharge status: Hospice | Payer: Self-pay | Source: Home / Self Care | Attending: Internal Medicine

## 2017-12-01 ENCOUNTER — Encounter (HOSPITAL_COMMUNITY): Payer: Self-pay | Admitting: Anesthesiology

## 2017-12-01 LAB — GLUCOSE, CAPILLARY
GLUCOSE-CAPILLARY: 58 mg/dL — AB (ref 70–99)
Glucose-Capillary: 37 mg/dL — CL (ref 70–99)
Glucose-Capillary: 84 mg/dL (ref 70–99)
Glucose-Capillary: 92 mg/dL (ref 70–99)

## 2017-12-01 LAB — CREATININE, URINE, RANDOM: Creatinine, Urine: 61.41 mg/dL

## 2017-12-01 LAB — SODIUM, URINE, RANDOM: Sodium, Ur: 73 mmol/L

## 2017-12-01 SURGERY — ERCP, WITH INTERVENTION IF INDICATED
Anesthesia: General

## 2017-12-01 MED ORDER — DEXTROSE 50 % IV SOLN
INTRAVENOUS | Status: AC
  Administered 2017-12-01: 25 mL
  Filled 2017-12-01: qty 50

## 2017-12-01 MED ORDER — ENSURE ENLIVE PO LIQD: 237.0000 mL | Freq: Two times a day (BID) | ORAL | 12 refills | Status: AC

## 2017-12-01 MED ORDER — SODIUM CHLORIDE 0.9 % IV SOLN
INTRAVENOUS | Status: DC | PRN
  Administered 2017-12-01: 500 mL via INTRAVENOUS

## 2017-12-01 NOTE — Progress Notes (Addendum)
CBG 58; admin IVP  remaining D50 (27ml)

## 2017-12-01 NOTE — Progress Notes (Signed)
Nutrition Brief Note  Chart reviewed. Pt now transitioning to comfort care.  No further nutrition interventions warranted at this time.   Lashuna Tamashiro, MS, RD, LDN [facility] Clinical Dietitian Pager: [phone] After Hours Pager: [phone]   

## 2017-12-01 NOTE — Progress Notes (Signed)
PMT progress note  84 yo gentleman with life limiting illness of pancreatic head mass, admitted with severe painless jaundice, elevated LFTs, hyper bilirubinemia. Hospital course also complicated by AKI, also has III CKD, DM, HTN, HLD. Patient presented with weight loss, nausea vomiting.   PMT has been consulted for facilitating goals of care discussions, following hospital course and helping address conversations.   There was to be a second attempt for biliary stent placement on 12-01-17. How ever, wife called and left a message on PMT main line stating that she didn't want the patient to undergo the procedure, she wished to discuss with our NP Ms Davis Gourd about other options.   PMT continues to follow.    11-30-17: I met with the patient's wife Zachary Cook who was at the bedside. Patient continues to appear icteric and with worsening generalized weakness      BP 131/67 (BP Location: Left Arm)   Pulse 65   Temp 98.7 F (37.1 C) (Oral)   Resp 20   Ht '5\' 4"'$  (1.626 m)   Wt 99.1 kg   SpO2 93%   BMI 37.50 kg/m    Labs and imaging noted. Renal and GI notes reviewed as well.   Patient is icteric, appears with generalized weakness.  S 1 S2 Regular breath sounds Abdomen is not distended No edema  He answers a few questions appropriately.   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  AKI III CKD Biliary obstruction.  Functional decline.   Chart reviewed, discussed with TRH MD on 11-28-17.   PMT to continue to follow, patient appears to have a high risk for ongoing decline/decompensation. Family meeting: I met with the patient's wife and son at bedside this am.  We sat down and discussed about the patient's current condition. He continues to have progressive decline, has AKI on top of underlying stage III CKD. He has ongoing elevated LFTs, he is dying from metastatic pancreatic cancer.  Both son and wife are now on the same page, family recognizes that the  patient likely has a serious and incurable illness.   Goals are for CSW to proceed with discussing choices with family about residential hospice.  Prognosis likely less than 2 weeks at this point.  All of the family's questions and concerns addressed to the best of my ability.     35 minutes spent.   Zachary Chance MD [facility] health palliative medicine team [phone]

## 2017-12-01 NOTE — Discharge Summary (Signed)
Discharge Summary  Zachary Cook KZS:[id number] DOB: 1932/06/19  PCP: Zachary Agreste, MD  Admit date: 11/22/2017 Discharge date: 12/01/2017  Time spent: 40 mins  Recommendations for Outpatient Follow-up:  1. Residential hospice care  Discharge Diagnoses:  Active Hospital Problems   Diagnosis Date Noted  . Malignant neoplasm of head of pancreas ([place])   . Generalized weakness   . Palliative care by specialist   . DNR (do not resuscitate)   . Pancreatic cancer ([place])   . Loss of weight   . Biliary obstruction 11/22/2017  . Elevated transaminase level 11/22/2017  . Jaundice 11/22/2017  . Hyperbilirubinemia 11/22/2017  . Elevated alkaline phosphatase level 11/22/2017  . S/P hernia repair 08/01/2017  . AKI (acute kidney injury) ([place]) 05/27/2016  . Type 2 diabetes mellitus ([place]) 05/09/2011  . Hypertension 05/09/2011    Resolved Hospital Problems  No resolved problems to display.    Discharge Condition: Poor  Diet recommendation: As per hospice  Vitals:   11/30/17 2129 12/01/17 0516  BP: 110/62 131/67  Pulse: 71 65  Resp: 20 20  Temp: 99.2 F (37.3 C) 98.7 F (37.1 C)  SpO2: 96% 93%    History of present illness:  Zachary Hsiung Hellieris a 84 y.o.malewith medical history significant forhypothyroid, htn, gi bleed (diverticulosis, s/p partial colectomy), incisional hernia repair 07/2017, T2DM, who presented with jaundice, nausea, worse with eating, with vomiting after most meals. PO intake decreased, weight loss of about 30-40 pounds, pale stools for about a few weeks. Pt was found to have hyperbilirubinemia along with elevated LFTs. GI consulted, MRCP done. S/p unsuccessful ERCP on 9/18. Due to worsening symptoms, renal failure, family and patient requested hospice services.   Today, pt denies any abdominal pain, chest pain, SOB, N/V/D/C. Noted to have hypoglycemic events this am. Pt to be transferred to residential hospice.   Hospital Course:  Active  Problems:   Type 2 diabetes mellitus (HCC)   Hypertension   AKI (acute kidney injury) ([place])   S/P hernia repair   Biliary obstruction   Elevated transaminase level   Jaundice   Hyperbilirubinemia   Elevated alkaline phosphatase level   Loss of weight   Pancreatic cancer (HCC)   Generalized weakness   Palliative care by specialist   DNR (do not resuscitate)   Malignant neoplasm of head of pancreas ([place])  Severe painless jaundice/hyperbilirubinemia/elevated LFTs secondary to pancreatitic head mass Abdominal ultrasound showed contracted gallbladder with moderate cholelithiasis.  Mild dilatation of common bile duct.  Mild hepatic steatosis LFTs as well as bilirubin and alk phos all elevated, worsening Gastroenterology recommended MRCP which showed no prominent dilatation of the biliary tree and of the dorsal pancreatic duct, findings concerning for possibility of adenocarcinoma pancreatic head.  Multiple gallstones. Status post unsuccessful ERCP on 11/26/17, per GI no further work up needed, advised palliative/hospice care S/P IV Zosyn, discontinued Hospice care, poor prognosis  Acute kidney injury on chronic kidney disease, stage III with metabolic acidosis Worsening Creatinine baseline approximately 1.5-1.6, on admission 2.73, currently up to 5.20 Renal US: Medical renal disease, no hydronephrosis Nephrology consulted: No further work up, not a candidate for HD, poor prognosis  Diabetes mellitus, type II complicated by hypoglycemia Poor oral intake Discontinued Metformin, Victoza, glipizide, insulin regimen Further care per hospice  Essential hypertension D/C amlodipine   Hyperlipidemia D/C statin  BPH Continue tamsulosin per hospice  Hypothyroidism Continue Synthroid per hospice  Chronic normocytic Anemia Hemoglobin currently 9.8, at baseline  Fall CT head unremarkable for acute intracranial  abnormalities Fall  precautions   Procedures:  MRCP  ERCP  Consultations:  GI  Oncology  Palliative/hospice   Discharge Exam: BP 131/67 (BP Location: Left Arm)   Pulse 65   Temp 98.7 F (37.1 C) (Oral)   Resp 20   Ht '5\' 4"'$  (1.626 m)   Wt 99.1 kg   SpO2 93%   BMI 37.50 kg/m   General: NAD, Jaundice, alert/awake/oriented  Cardiovascular: S1, S2 present  Respiratory: CTAB  Discharge Instructions You were cared for by a hospitalist during your hospital stay. If you have any questions about your discharge medications or the care you received while you were in the hospital after you are discharged, you can call the unit and asked to speak with the hospitalist on call if the hospitalist that took care of you is not available. Once you are discharged, your primary care physician will handle any further medical issues. Please note that NO REFILLS for any discharge medications will be authorized once you are discharged, as it is imperative that you return to your primary care physician (or establish a relationship with a primary care physician if you do not have one) for your aftercare needs so that they can reassess your need for medications and monitor your lab values.   Allergies as of 12/01/2017   No Known Allergies     Medication List    STOP taking these medications   ACCU-CHEK SMARTVIEW test strip Generic drug:  glucose blood   amLODipine 5 MG tablet Commonly known as:  NORVASC   aspirin EC 81 MG tablet   atorvastatin 10 MG tablet Commonly known as:  LIPITOR   B-D UF III MINI PEN NEEDLES 31G X 5 MM Misc Generic drug:  Insulin Pen Needle   esomeprazole 40 MG capsule Commonly known as:  NEXIUM   gabapentin 300 MG capsule Commonly known as:  NEURONTIN   glimepiride 2 MG tablet Commonly known as:  AMARYL   HYDROcodone-acetaminophen 5-325 MG tablet Commonly known as:  NORCO/VICODIN   Insulin Glargine 300 UNIT/ML Sopn   metFORMIN 500 MG 24 hr tablet Commonly known as:   GLUCOPHAGE-XR   VICTOZA 18 MG/3ML Sopn Generic drug:  liraglutide     TAKE these medications   feeding supplement (ENSURE ENLIVE) Liqd Take 237 mLs by mouth 2 (two) times daily between meals. Start taking on:  12/02/2017   levothyroxine 137 MCG tablet Commonly known as:  SYNTHROID, LEVOTHROID TAKE 1 TABLET(137 MCG) BY MOUTH DAILY What changed:  See the new instructions.   tamsulosin 0.4 MG Caps capsule Commonly known as:  FLOMAX TAKE 1 CAPSULE BY MOUTH DAILY      No Known Allergies    The results of significant diagnostics from this hospitalization (including imaging, microbiology, ancillary and laboratory) are listed below for reference.    Significant Diagnostic Studies: Ct Head Wo Contrast  Result Date: 11/24/2017 CLINICAL DATA:  84 year old male with a history of fall and weakness EXAM: CT HEAD WITHOUT CONTRAST TECHNIQUE: Contiguous axial images were obtained from the base of the skull through the vertex without intravenous contrast. COMPARISON:  None. FINDINGS: Brain: No acute intracranial hemorrhage. No midline shift or mass effect. Gray-white differentiation maintained. Mild cerebral volume loss. Unremarkable appearance of the ventricle configuration. Hypodensity in the bilateral basal ganglia. Vascular: Calcifications of the intracranial vasculature. Skull: No acute displaced fracture. No scalp hematoma. No radiopaque foreign body. Sinuses/Orbits: No acute finding. Other: None. IMPRESSION: Negative for acute intracranial abnormality. Evidence of chronic microvascular ischemic disease and  associated intracranial atherosclerosis. Electronically Signed   By: Corrie Mckusick D.O.   On: 11/24/2017 11:36   US Abdomen Complete  Result Date: 11/22/2017 CLINICAL DATA:  Jaundice with weight loss over 4 weeks. EXAM: ABDOMEN ULTRASOUND COMPLETE COMPARISON:  CT 02/13/2017 FINDINGS: Gallbladder: Contracted gallbladder full of stones. Negative sonographic Murphy sign. Wall thickness is 1.6  mm. No adjacent pericholecystic fluid. Largest stone 2.1 cm. Common bile duct: Diameter: 8.9 mm Liver: Increased parenchymal echogenicity without focal mass. Mild central intrahepatic ductal dilatation. Portal vein is patent on color Doppler imaging with normal direction of blood flow towards the liver. IVC: No abnormality visualized. Pancreas: Not visualized. Spleen: Size and appearance within normal limits. Right Kidney: Length: 11.2 cm. Echogenicity within normal limits. 1 cm cyst over the mid pole. No mass or hydronephrosis visualized. Left Kidney: Length: 10.0 cm 1.1 cm cyst over the mid pole. Echogenicity within normal limits. No mass or hydronephrosis visualized. Abdominal aorta: No aneurysm visualized. Other findings: None. IMPRESSION: Contracted gallbladder with moderate cholelithiasis. No additional sonographic to suggest cholecystitis. Mild dilatation of the common bile duct. MRCP may be helpful in this patient with jaundice. Findings compatible with mild hepatic steatosis without focal mass. Small bilateral renal cysts. Electronically Signed   By: Marin Olp M.D.   On: 11/22/2017 21:16   US Renal  Result Date: 11/26/2017 CLINICAL DATA:  Acute kidney injury chronic kidney disease EXAM: RENAL / URINARY TRACT ULTRASOUND COMPLETE COMPARISON:  CT 02/13/2017, MRI 11/24/2017 FINDINGS: Right Kidney: Length: 10.5 cm. Increased cortical echogenicity. No hydronephrosis. Cyst upper pole measuring 9 mm. Left Kidney: Length: 10.1 cm. Slightly echogenic. No hydronephrosis. Cyst in the midpole measuring 1.3 cm. Bladder: Appears normal for degree of bladder distention. Incidental note made of small amount of ascites in the right upper quadrant IMPRESSION: 1. Slight increased cortical echogenicity as may be seen with medical renal disease. No hydronephrosis 2. Small cysts in the kidneys 3. Trace ascites in the right upper quadrant Electronically Signed   By: Donavan Foil M.D.   On: 11/26/2017 19:21   Mr Abdomen  Mrcp Wo Contrast  Result Date: 11/24/2017 CLINICAL DATA:  Jaundice and abdominal pain. Cholelithiasis. Dilated common bile duct on ultrasound. EXAM: MRI ABDOMEN WITHOUT CONTRAST  (INCLUDING MRCP) TECHNIQUE: Multiplanar multisequence MR imaging of the abdomen was performed. Heavily T2-weighted images of the biliary and pancreatic ducts were obtained, and three-dimensional MRCP images were rendered by post processing. COMPARISON:  Multiple exams, including ultrasound of 11/22/2017 and CT scan from 02/13/2017 FINDINGS: Despite efforts by the technologist and patient, motion artifact is present on today's exam and could not be eliminated. This reduces exam sensitivity and specificity. Lower chest: Moderate cardiomegaly. Interstitial accentuation in the lung bases favoring interstitial edema. There is likely some mild airspace opacity medially in the right lower lobe. Hepatobiliary: There is abrupt truncation of the dilated common bile duct in the pancreatic head. CBD proximal to this measures 1.6 cm in diameter, and back on the CT exam from 02/13/2017 measured only about 3 mm in diameter. There is mild beading of the extrahepatic biliary tree and notable intrahepatic biliary dilatation as well. The gallbladder is contracted around several gallstones. The stones fill the contracted gallbladder. I do not see a stone in the common bile duct. Pancreas: There is new and abnormal dilation of the dorsal pancreatic duct up to 1.3 cm in diameter. This terminates abruptly in the pancreatic head in the same vicinity as the common bile duct. The entire dorsal pancreatic duct is dilated.  Pancreatic atrophy noted. On diffusion-weighted imaging such as image 63/4, there appears to be restricted diffusion in the portion of the pancreatic head associated with truncation of the dorsal pancreatic duct and common bile duct. There is also abnormal fullness of this portion of the pancreatic head for example on image 35/3. Spleen: The  spleen appears unremarkable but there is perisplenic ascites. Adrenals/Urinary Tract: There is scarring of the left mid to upper kidney laterally. Adrenal glands normal. Stomach/Bowel: Small type 1 hiatal hernia. Vascular/Lymphatic:  Aortoiliac atherosclerotic vascular disease. Other:  Mild perihepatic and perisplenic ascites. Musculoskeletal: Unremarkable IMPRESSION: 1. New prominent dilatation of the biliary tree and of the dorsal pancreatic duct, probably terminating in the pancreatic head where there appears to be a focus of restricted diffusion and soft tissue prominence. Although we do not have confirmatory postcontrast imaging, and although today's images are somewhat adversely affected by motion artifact, the appearance is considered highly concerning for the possibility of adenocarcinoma of the pancreatic head. I note that the patient's creatinine is very elevated and accordingly postcontrast imaging may not be feasible. Endoscopic ultrasound might be an option to further characterize the pancreatic head. 2. There are multiple gallstones filling the gallbladder, but the truncation in the common bile duct does not appear to have the signal characteristics of these gallstones. 3. Moderate cardiomegaly with interstitial accentuation in the lung bases favoring interstitial edema. There is likely some airspace opacity medially in the right lower lobe. 4. Mild upper abdominal ascites. 5. Scarring of the left kidney. 6. Small type 1 hiatal hernia. 7.  Aortic Atherosclerosis (ICD10-I70.0). Electronically Signed   By: Van Clines M.D.   On: 11/24/2017 21:18   Mr 3d Recon At Scanner  Result Date: 11/24/2017 CLINICAL DATA:  Jaundice and abdominal pain. Cholelithiasis. Dilated common bile duct on ultrasound. EXAM: MRI ABDOMEN WITHOUT CONTRAST  (INCLUDING MRCP) TECHNIQUE: Multiplanar multisequence MR imaging of the abdomen was performed. Heavily T2-weighted images of the biliary and pancreatic ducts were  obtained, and three-dimensional MRCP images were rendered by post processing. COMPARISON:  Multiple exams, including ultrasound of 11/22/2017 and CT scan from 02/13/2017 FINDINGS: Despite efforts by the technologist and patient, motion artifact is present on today's exam and could not be eliminated. This reduces exam sensitivity and specificity. Lower chest: Moderate cardiomegaly. Interstitial accentuation in the lung bases favoring interstitial edema. There is likely some mild airspace opacity medially in the right lower lobe. Hepatobiliary: There is abrupt truncation of the dilated common bile duct in the pancreatic head. CBD proximal to this measures 1.6 cm in diameter, and back on the CT exam from 02/13/2017 measured only about 3 mm in diameter. There is mild beading of the extrahepatic biliary tree and notable intrahepatic biliary dilatation as well. The gallbladder is contracted around several gallstones. The stones fill the contracted gallbladder. I do not see a stone in the common bile duct. Pancreas: There is new and abnormal dilation of the dorsal pancreatic duct up to 1.3 cm in diameter. This terminates abruptly in the pancreatic head in the same vicinity as the common bile duct. The entire dorsal pancreatic duct is dilated. Pancreatic atrophy noted. On diffusion-weighted imaging such as image 63/4, there appears to be restricted diffusion in the portion of the pancreatic head associated with truncation of the dorsal pancreatic duct and common bile duct. There is also abnormal fullness of this portion of the pancreatic head for example on image 35/3. Spleen: The spleen appears unremarkable but there is perisplenic ascites. Adrenals/Urinary Tract: There  is scarring of the left mid to upper kidney laterally. Adrenal glands normal. Stomach/Bowel: Small type 1 hiatal hernia. Vascular/Lymphatic:  Aortoiliac atherosclerotic vascular disease. Other:  Mild perihepatic and perisplenic ascites. Musculoskeletal:  Unremarkable IMPRESSION: 1. New prominent dilatation of the biliary tree and of the dorsal pancreatic duct, probably terminating in the pancreatic head where there appears to be a focus of restricted diffusion and soft tissue prominence. Although we do not have confirmatory postcontrast imaging, and although today's images are somewhat adversely affected by motion artifact, the appearance is considered highly concerning for the possibility of adenocarcinoma of the pancreatic head. I note that the patient's creatinine is very elevated and accordingly postcontrast imaging may not be feasible. Endoscopic ultrasound might be an option to further characterize the pancreatic head. 2. There are multiple gallstones filling the gallbladder, but the truncation in the common bile duct does not appear to have the signal characteristics of these gallstones. 3. Moderate cardiomegaly with interstitial accentuation in the lung bases favoring interstitial edema. There is likely some airspace opacity medially in the right lower lobe. 4. Mild upper abdominal ascites. 5. Scarring of the left kidney. 6. Small type 1 hiatal hernia. 7.  Aortic Atherosclerosis (ICD10-I70.0). Electronically Signed   By: Van Clines M.D.   On: 11/24/2017 21:18   Dg Chest Port 1 View  Result Date: 11/26/2017 CLINICAL DATA:  Shortness of breath EXAM: PORTABLE CHEST 1 VIEW COMPARISON:  02/21/2017 FINDINGS: Cardiomegaly with vascular congestion. Interstitial prominence throughout the lungs could reflect interstitial edema. Low lung volumes with bibasilar atelectasis and possible small effusions. IMPRESSION: Cardiomegaly with vascular congestion and probable mild interstitial edema. Bibasilar atelectasis and small effusions. Electronically Signed   By: Rolm Baptise M.D.   On: 11/26/2017 10:10   Dg Ercp Biliary & Pancreatic Ducts  Result Date: 11/26/2017 CLINICAL DATA:  ERCP with partial sphincterotomy. Known pancreatic mass. EXAM: ERCP TECHNIQUE:  Multiple spot images obtained with the fluoroscopic device and submitted for interpretation post-procedure. COMPARISON:  MRCP - 11/24/2017 FLUOROSCOPY TIME:  3 minutes, 17 seconds FINDINGS: A single fluoroscopic sequence of the right upper abdominal quadrant during ERCP is provided for review Provided sequence demonstrates apparent selective cannulation and opacification of the presumed pancreatic duct which appears moderate to markedly dilated as demonstrated on preceding MRCP. IMPRESSION: ERCP as above. These images were submitted for radiologic interpretation only. Please see the procedural report for the amount of contrast and the fluoroscopy time utilized. Electronically Signed   By: Sandi Mariscal M.D.   On: 11/26/2017 14:04    Microbiology: No results found for this or any previous visit (from the past 240 hour(s)).   Labs: Basic Metabolic Panel: Recent Labs  Lab 11/25/17 0526 11/26/17 0330 11/27/17 0414 11/28/17 0847 11/29/17 0514 11/29/17 0839 11/30/17 0748  NA 133* 136 138 136 136 137 141  K 3.6 4.1 3.6 4.3 6.2* 5.0 4.2  CL 103 103 106 103 103 103 107  CO2 18* 21* 20* 19* 18* 21* 18*  GLUCOSE 196* 194* 66* 106* 129* 120* 64*  BUN 44* 48* 51* 55* 61* 64* 73*  CREATININE 2.98* 3.37* 3.86* 4.42* 4.56* 4.62* 5.20*  CALCIUM 8.8* 8.5* 8.6* 8.8* 8.9 8.8* 9.0  MG 2.0 1.9  --  1.9  --   --   --   PHOS 3.0 3.4  --   --   --  4.9*  --    Liver Function Tests: Recent Labs  Lab 11/26/17 0330 11/27/17 0414 11/28/17 0847 11/29/17 0514 11/29/17 0839 11/30/17  0748  AST 241* 143* 134* 157*  --  177*  ALT 282* 226* 195* 171*  --  155*  ALKPHOS 1,546* 1,434* 1,362* 1,237*  --  1,249*  BILITOT 28.3* 33.1* 31.7* 31.9*  --  30.9*  PROT 5.1* 5.2* 5.5* 5.2*  --  5.0*  ALBUMIN 1.8* 1.9* 1.9* 1.8* 1.8* 1.7*   Recent Labs  Lab 11/25/17 0526  LIPASE 51   No results for input(s): AMMONIA in the last 168 hours. CBC: Recent Labs  Lab 11/25/17 0526 11/26/17 0330 11/27/17 0414  11/28/17 0847  WBC 7.0 7.1 5.1 8.8  NEUTROABS 4.7 5.2  --   --   HGB 10.1* 8.9* 9.5* 9.8*  HCT 31.0* 27.3* 29.7* 30.8*  MCV 83.3 84.3 84.6 84.6  PLT 205 175 140* 170   Cardiac Enzymes: No results for input(s): CKTOTAL, CKMB, CKMBINDEX, TROPONINI in the last 168 hours. BNP: BNP (last 3 results) No results for input(s): BNP in the last 8760 hours.  ProBNP (last 3 results) No results for input(s): PROBNP in the last 8760 hours.  CBG: Recent Labs  Lab 11/30/17 2124 12/01/17 0758 12/01/17 0826 12/01/17 1310 12/01/17 1350  GLUCAP 73 37* 84 58* 92       Signed:  Alma Friendly, MD [facility] 12/01/2017, 1:52 PM

## 2017-12-01 NOTE — Clinical Social Work Note (Signed)
Clinical Social Work Assessment  Patient Details  Name: Zachary Cook MRN: [id number] Date of Birth: November 04, 1932  Date of referral:  12/01/17               Reason for consult:  End of Life/Hospice                Permission sought to share information with:  Family Supports Permission granted to share information::  Yes, Verbal Permission Granted  Name::     son Jonetta Osgood::  [facility]  Relationship::     Contact Information:     Housing/Transportation Living arrangements for the past 2 months:  Single Family Home Source of Information:  Patient, Adult Children, Palliative Care Team Patient Interpreter Needed:  None Criminal Activity/Legal Involvement Pertinent to Current Situation/Hospitalization:  No - Comment as needed Significant Relationships:  Adult Children, Spouse Lives with:  Spouse Do you feel safe going back to the place where you live?  Yes Need for family participation in patient care:  Yes (Comment)(family involved)  Care giving concerns:  Pt admitted from home where he resides with his wife. Has cancer and was admitted with jaundice. Experienced a fall at home PTA.  Social Worker assessment / plan:  CSW consulted to assist with connecting pt with hospice care. Pt and family explain that after discussion with providers here and PMT, have decided that they are pursuing comfort-focused path and residential hospice setting deemed most appropriate to meet pt's needs. Discussed options with family and they prefer [facility]. CSW made referral to [facility], will follow.   Employment status:  Retired Nurse, adult PT Recommendations:  Not assessed at this time Information / Referral to community resources:     Patient/Family's Response to care:  Appreciative and accepting  Patient/Family's Understanding of and Emotional Response to Diagnosis, Current Treatment, and Prognosis:  Both demonstrate good  understanding of treatment decisions and explain course of events leading to this path in detail.  Seem peaceful and accepting of decisions, thanked CSW repeatedly for hospital care and assistance with disposition.   Emotional Assessment Appearance:  Appears stated age Attitude/Demeanor/Rapport:    Affect (typically observed):  Appropriate, Calm Orientation:  Oriented to Self, Oriented to Place, Oriented to Situation Alcohol / Substance use:  Not Applicable Psych involvement (Current and /or in the community):  No (Comment)  Discharge Needs  Concerns to be addressed:  Care Coordination Readmission within the last 30 days:  No Current discharge risk:  Terminally ill Barriers to Discharge:  No Barriers Identified   Nila Nephew, LCSW 12/01/2017, 11:52 AM  [phone]

## 2017-12-01 NOTE — Progress Notes (Signed)
PT Cancellation Note  Patient Details Name: Zachary Cook MRN: [id number] DOB: Jan 21, 1933   Cancelled Treatment:    Reason Eval/Treat Not Completed: Other (comment) RN reports palliative care team to meet with family today regarding goals of care and d/c plans.  Currently leaning towards residential hospice.  Will check back as necessary pending decisions.   Colburn Asper,KATHrine E 12/01/2017, 10:31 AM Carmelia Bake, PT, DPT Acute Rehabilitation Services Office: [phone] Pager: [phone]

## 2017-12-01 NOTE — Progress Notes (Signed)
[facility] here to transport patient. Stable from AM assessment

## 2017-12-01 NOTE — Progress Notes (Signed)
Called report to Sharlyne Pacas, RN at [facility] that will receive the patient.  Patient stable from AM assessment.  Will be transported via [facility] to facility.  Santiago Glad asked that his IV be left in as well as condom catheter to remain in place.

## 2017-12-01 NOTE — Progress Notes (Signed)
Pt admitting to [facility]- pt's son [name] going to facility to complete paperwork and [facility] transportation arranged 4pm.  Sharren Bridge, MSW, LCSW Clinical Social Work 12/01/2017 [phone]

## 2017-12-09 DEATH — deceased

## 2017-12-10 ENCOUNTER — Telehealth: Payer: Self-pay | Admitting: Family Medicine

## 2017-12-10 NOTE — Telephone Encounter (Signed)
Noticed received from Hospice, patient died on 12-19-17 - Hospice home death.  Called home number to speak to Charlett Nose and express my condolences on his passing. Left message on VM that I will try to reach her again in the coming days. Busy signal on mobile number with multiple attempts.

## 2017-12-11 ENCOUNTER — Telehealth: Payer: Self-pay | Admitting: Family Medicine

## 2017-12-11 NOTE — Telephone Encounter (Signed)
Called Zachary Cook.  She has been doing overall okay, still struggling somewhat with her husband's passing.  She does have her granddaughter checking on her frequently as well as her daughter calling her each day.  Denied any acute needs at this time.  Expressed condolences on her husband's passing and advised if any needs for counseling or other needs to reach out to see if we may be able to help.

## 2018-09-29 IMAGING — NM NM GI BLOOD LOSS
1 series · 12 of 12 positions shown · non-contrast
Comparison: None.

CLINICAL DATA: 83-year-old male with bloody stools.

EXAM:
NUCLEAR MEDICINE GASTROINTESTINAL BLEEDING SCAN
TECHNIQUE: Sequential abdominal images were obtained following intravenous
administration of 8c-QQm labeled red blood cells.
RADIOPHARMACEUTICALS:  21.1 mCi 8c-QQm in-vitro labeled red cells.

[Series 1: antr 1hr · 4.46mm/px · 2 acquisitions, 12 frames shown]
[im 1/2]
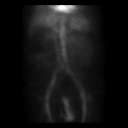
[im 1/2]
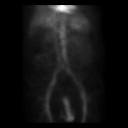
[im 1/2]
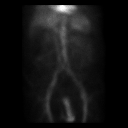
[im 1/2]
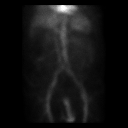
[im 1/2]
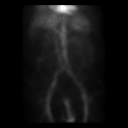
[im 1/2]
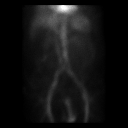
[im 2/2]
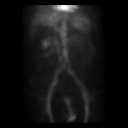
[im 2/2]
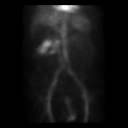
[im 2/2]
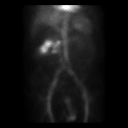
[im 2/2]
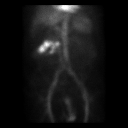
[im 2/2]
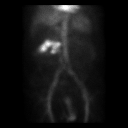
[im 2/2]
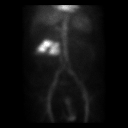

[12 of 12 positions shown; findings below may reference images not displayed]

FINDINGS: There is physiologic uptake in the liver spleen and cardiac blood
pool as well as uptake within the aorta and IVC.

There is radiotracer activity in the right upper abdomen conforming
to the location of the hepatic flexure and proximal transverse colon
consistent with active GI bleed.
IMPRESSION: Active GI bleed in the hepatic flexure/ proximal transverse colon.

These results were called by telephone at the time of interpretation
on 05/28/2016 at [DATE] to Dr. ROBERTO TIGER , who verbally
acknowledged these results.

## 2018-09-30 IMAGING — DX DG CHEST 1V PORT
1 series · 1 of 1 positions shown · non-contrast
Comparison: None.

CLINICAL DATA: GI bleed.  Line placement.

EXAM:
PORTABLE CHEST 1 VIEW

[chest ap]
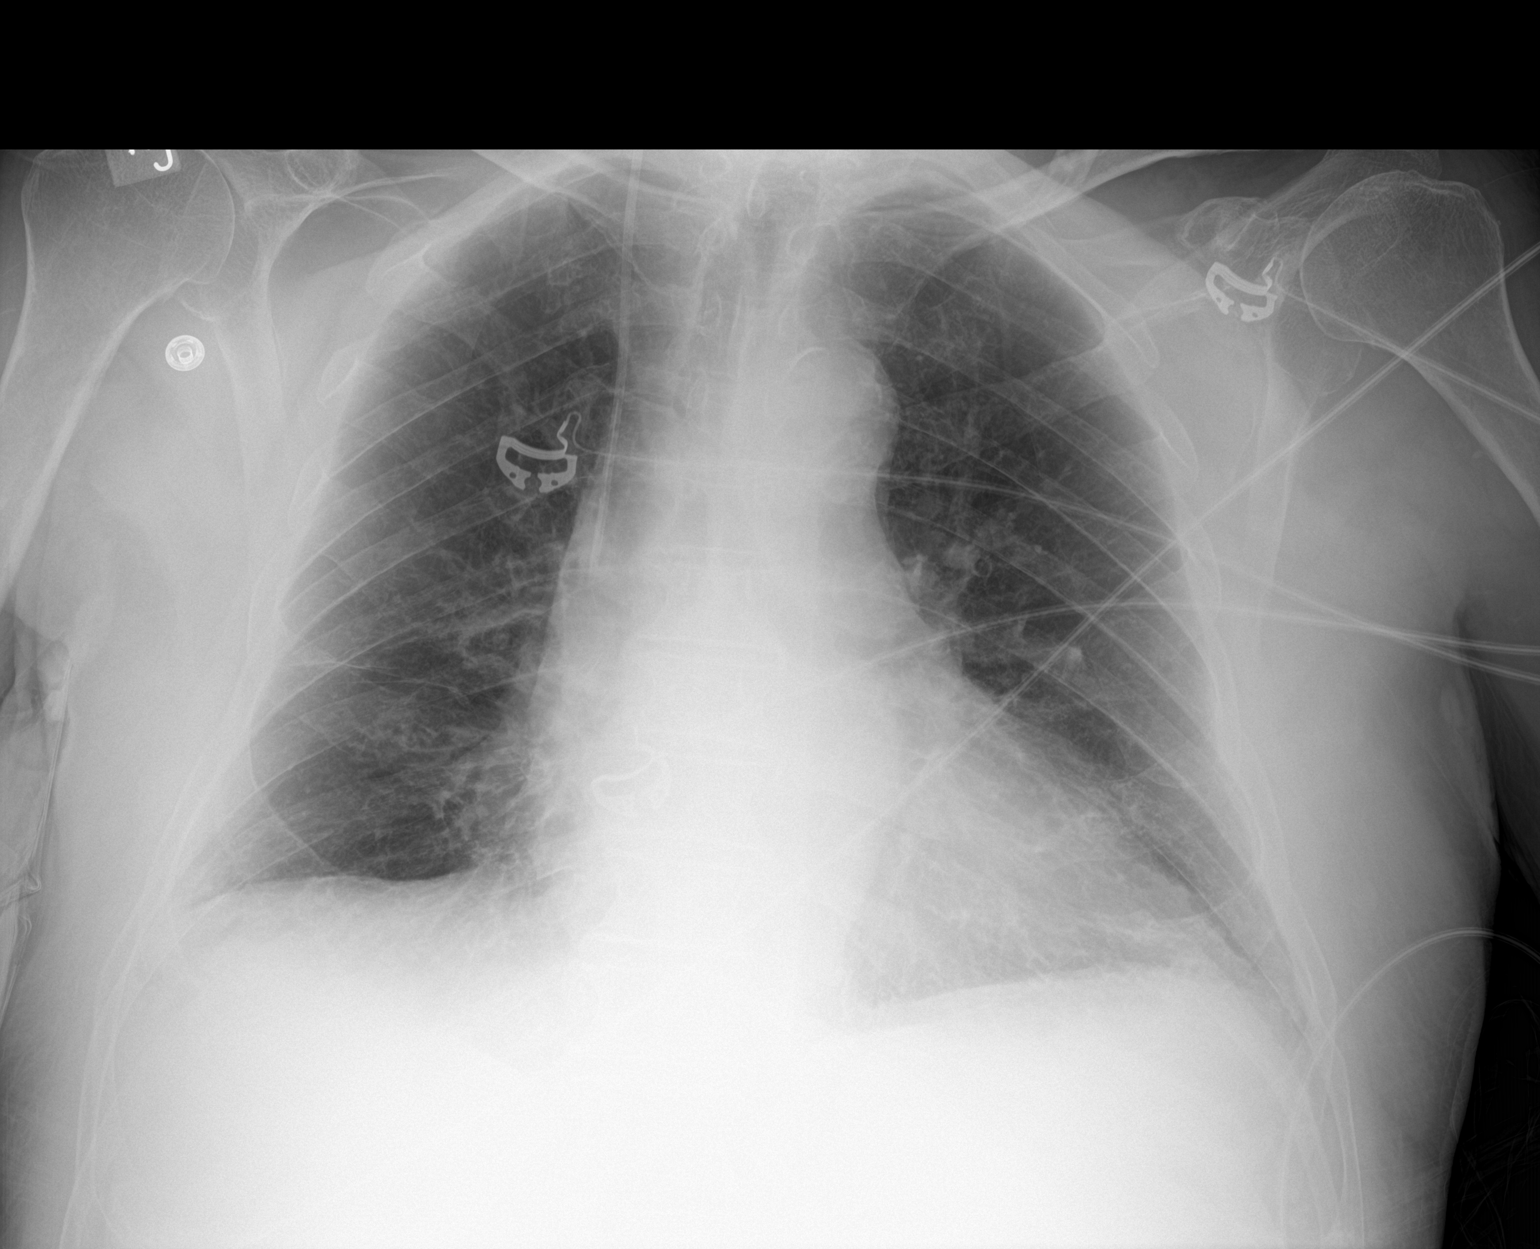

[1 of 1 positions shown; findings below may reference images not displayed]

FINDINGS: Low lung volumes. Borderline cardiac enlargement. Thoracic
atherosclerosis. RIGHT IJ central venous line tip SVC RA junction.
No pneumothorax.

Bibasilar scarring without infiltrates or significant atelectasis.
No effusion. Osteopenia.
IMPRESSION: Chronic changes as described.

RIGHT IJ catheter tip SVC RA junction.  No pneumothorax.

## 2018-10-16 IMAGING — DX DG ABD PORTABLE 1V
1 series · 1 of 1 positions shown · non-contrast
Comparison: June 12, 2016

CLINICAL DATA: Ileus

EXAM:
PORTABLE ABDOMEN - 1 VIEW

[abdomen kub]
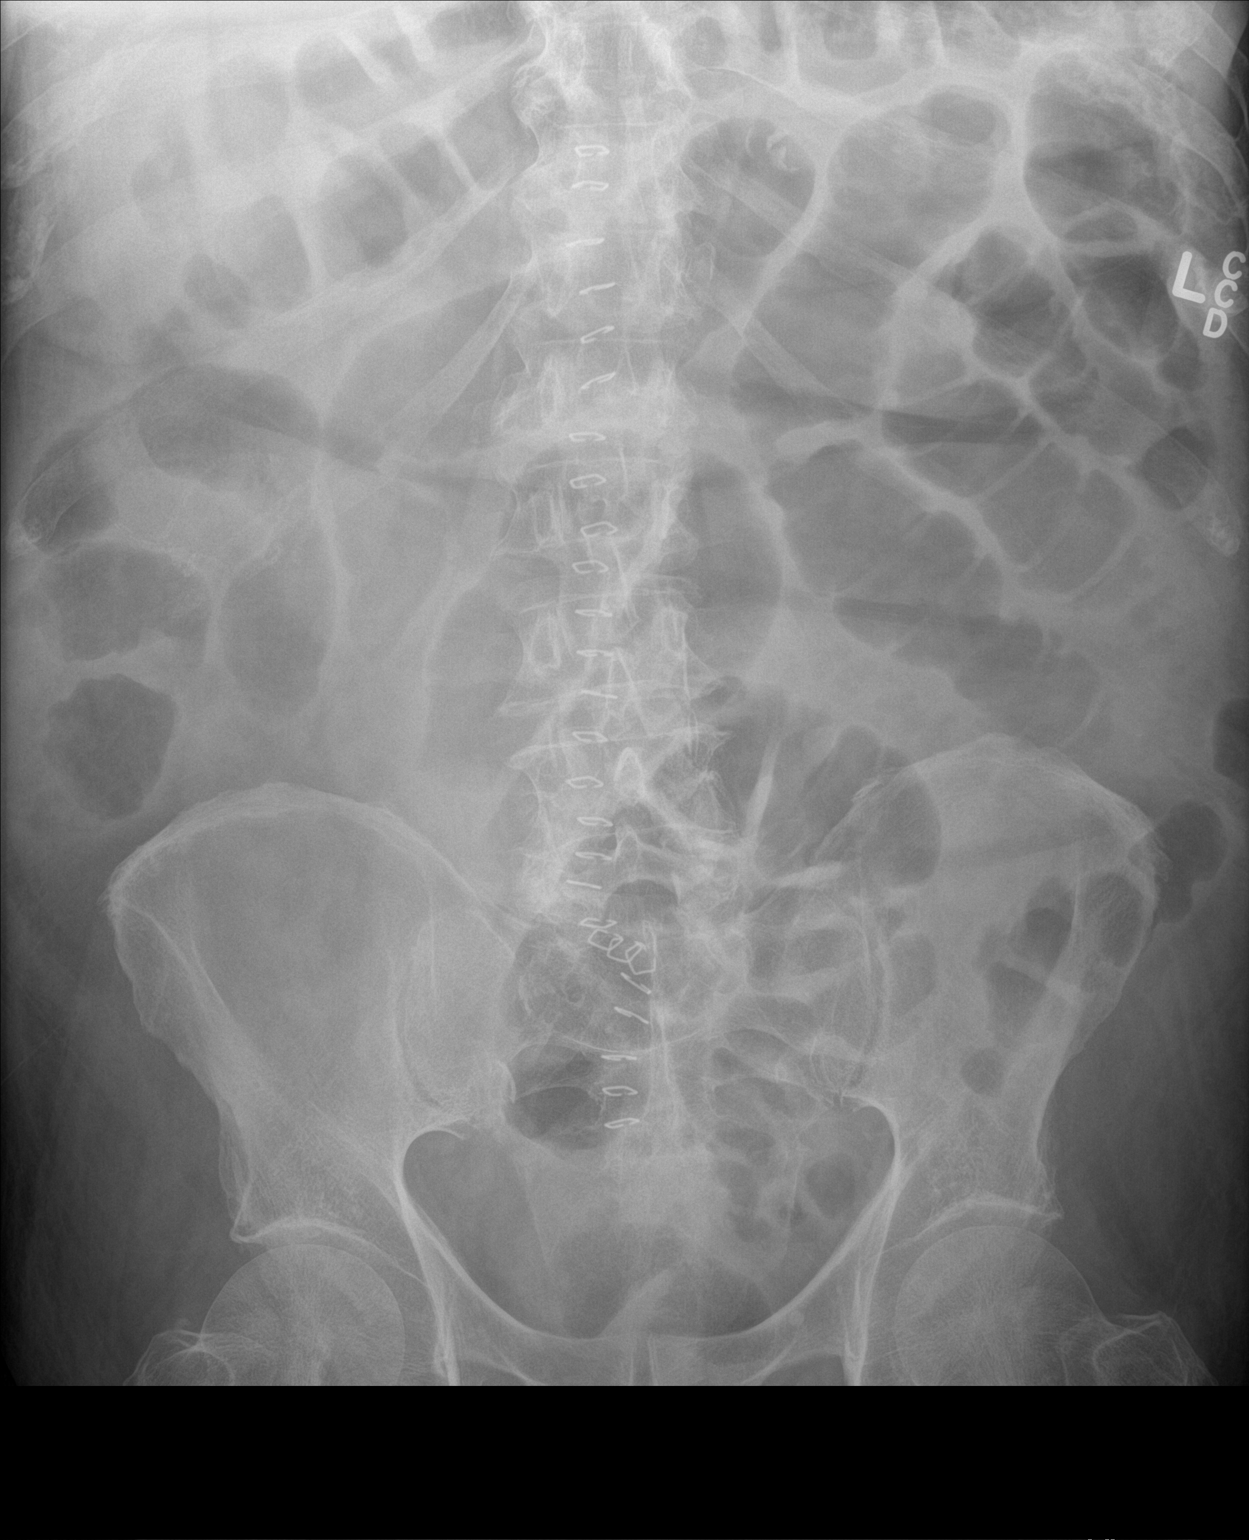

[1 of 1 positions shown; findings below may reference images not displayed]

FINDINGS: Multiple loops of small bowel remain mildly dilated. No appreciable
air-fluid levels. No free air evident. There are skin staples along
the midline abdomen and pelvis. There are surgical clips in the
right inguinal region.
IMPRESSION: Mild dilatation of multiple loops of small bowel, likely due to
ileus. No free air. Similar appearance compared to 1 day prior.

## 2019-06-26 IMAGING — DX DG CHEST 2V
2 series · 2 of 2 positions shown · non-contrast
Comparison: 06/10/2016 .

CLINICAL DATA: Preoperative clearance.  Chest x-ray 06/10/2016.

EXAM:
CHEST  2 VIEW

[chest pa]
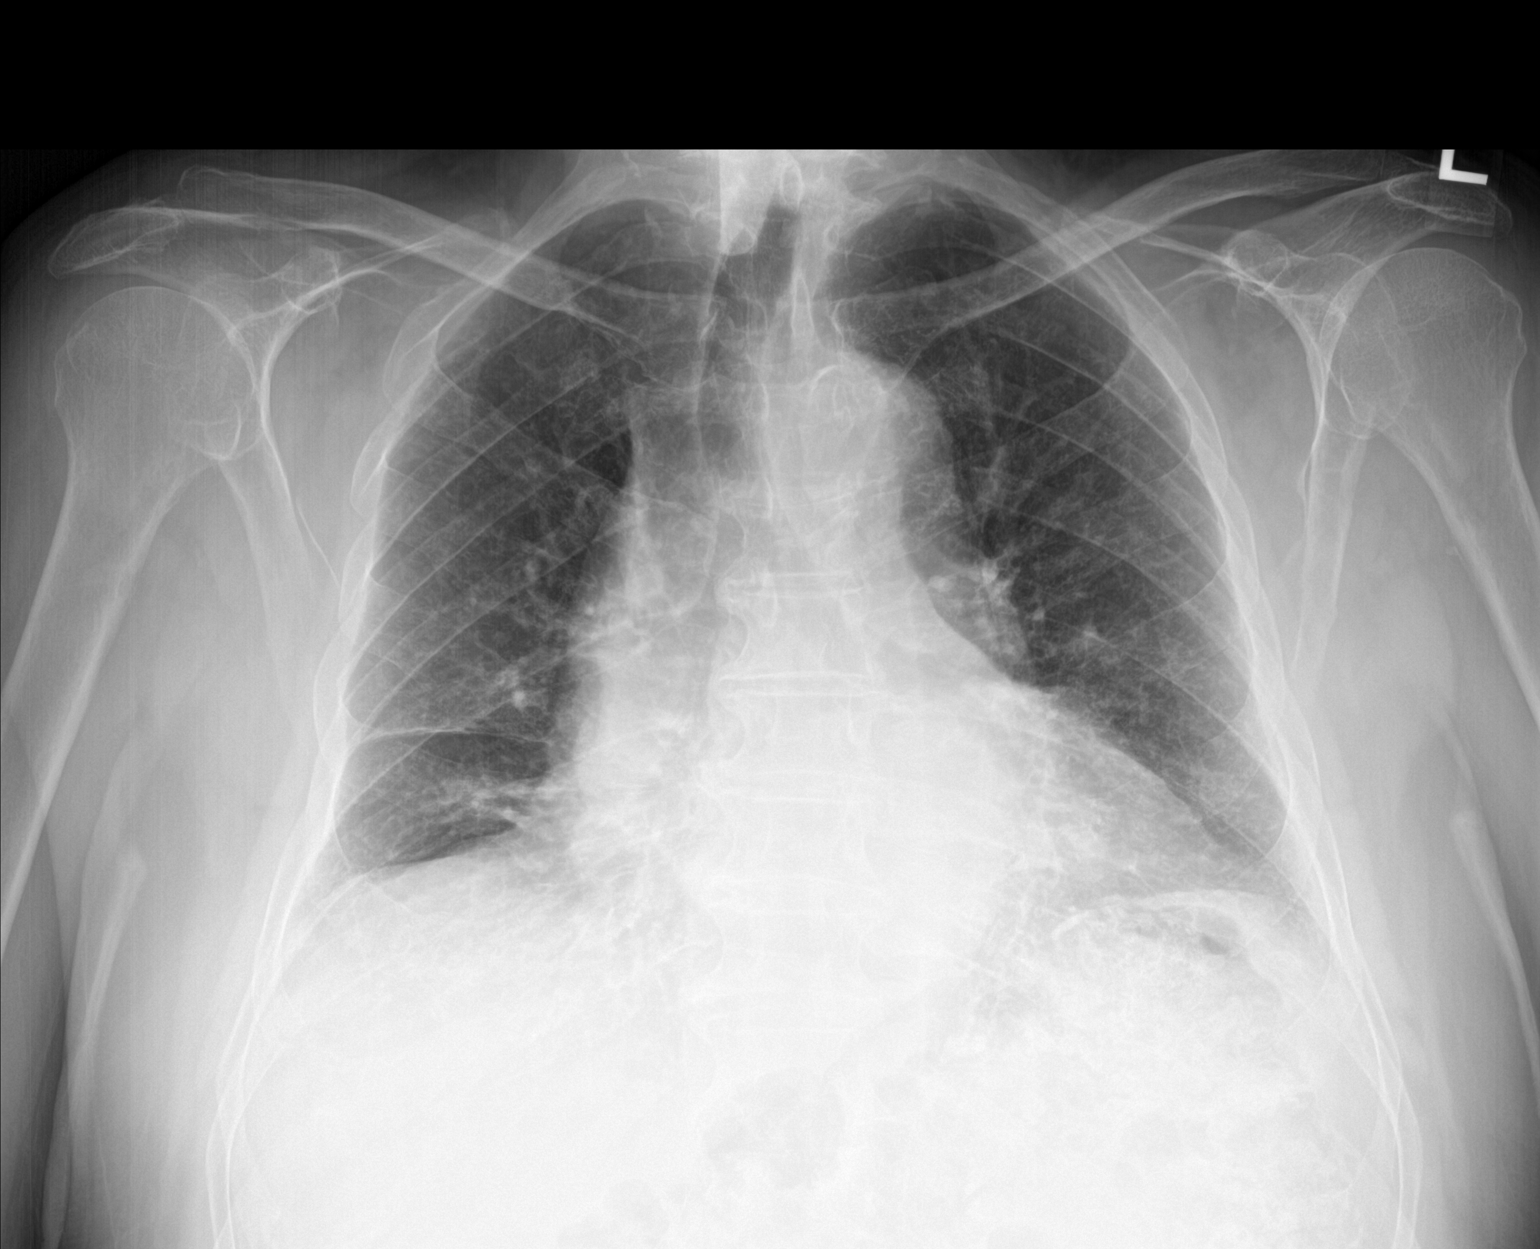

[chest lat]
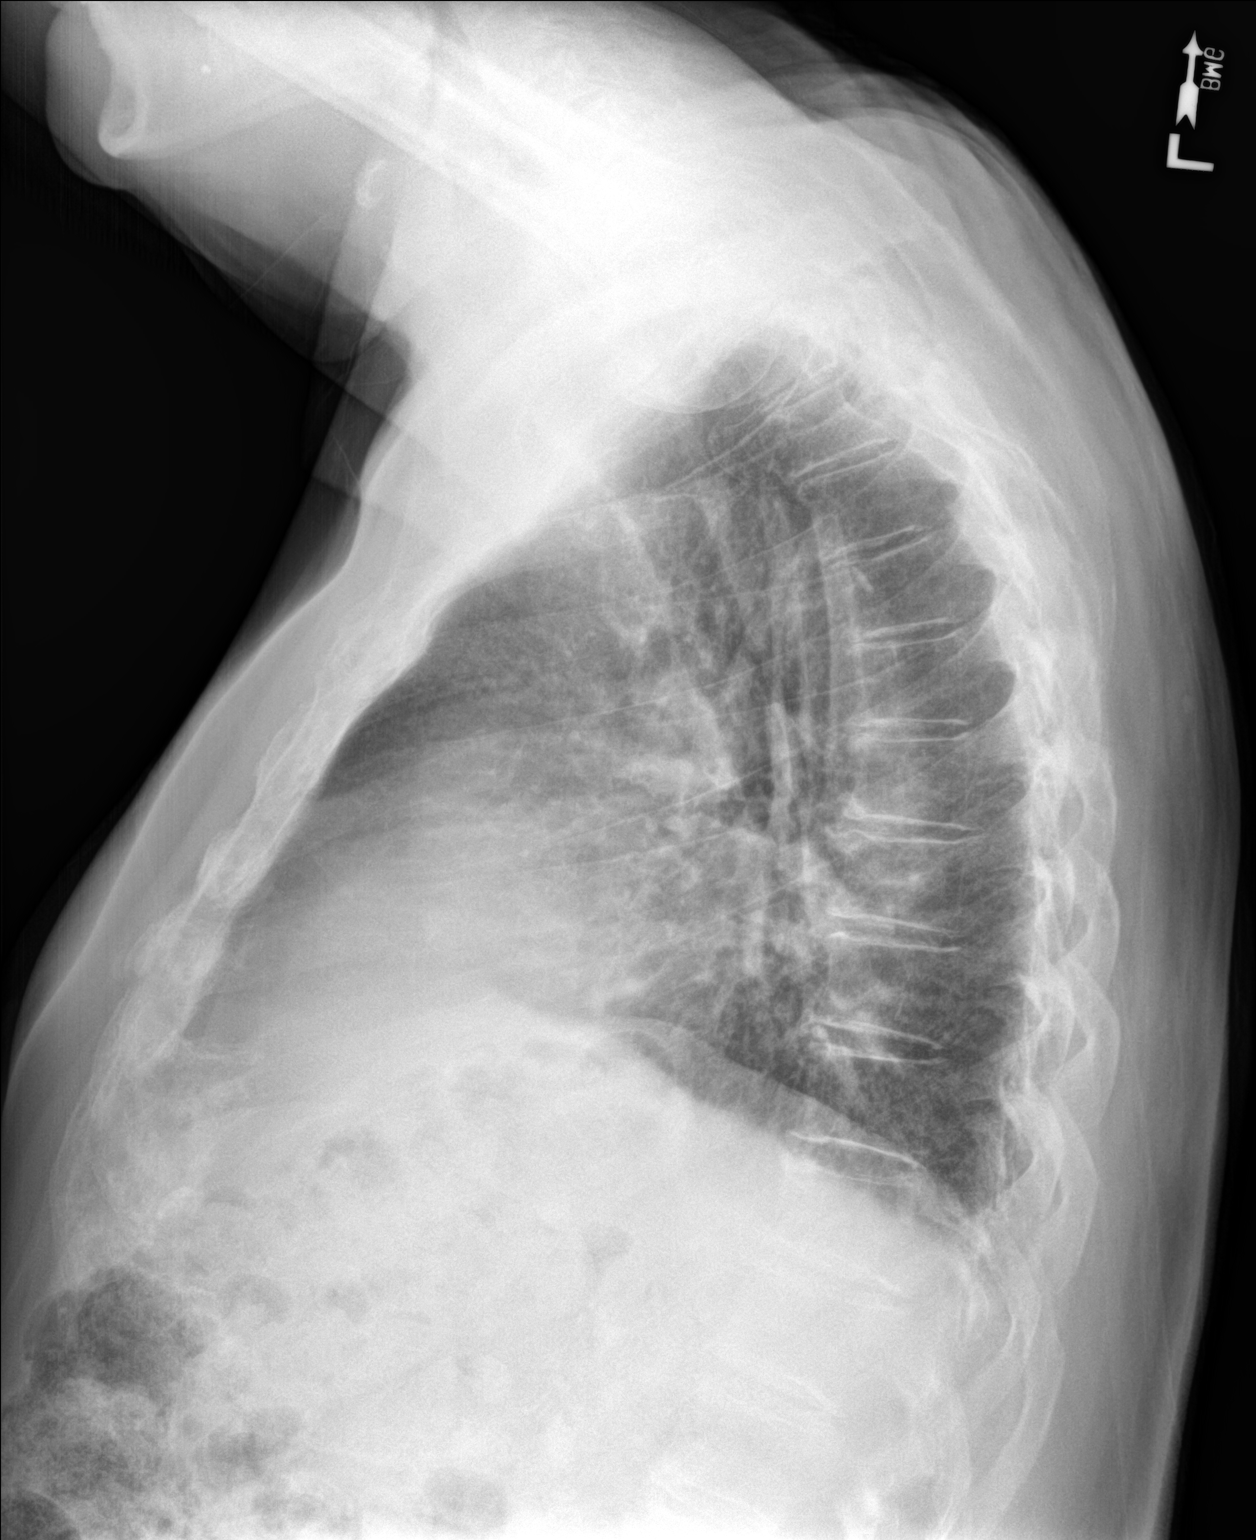

[2 of 2 positions shown; findings below may reference images not displayed]

FINDINGS: Interim removal of right PICC line. Cardiomegaly with normal
pulmonary vascularity. Low lung volumes with mild basilar
atelectasis. Improved aeration in the lung bases from prior exam. No
pleural effusion or pneumothorax .
IMPRESSION: 1. Interim removal of PICC line.

2. Cardiomegaly with normal pulmonary vascularity.

3. Low lung volumes with mild basilar atelectasis. Improved aeration
lung bases from prior exam .
